# Patient Record
Sex: Female | Born: 1950 | Race: White | Hispanic: No | Marital: Single | Smoking: Former smoker
Health system: Southern US, Community
[De-identification: ages and names within clinical notes are randomized; demographics above are authoritative.]

## PROBLEM LIST (undated history)

## (undated) DIAGNOSIS — H269 Unspecified cataract: Secondary | ICD-10-CM

## (undated) DIAGNOSIS — E785 Hyperlipidemia, unspecified: Secondary | ICD-10-CM

## (undated) DIAGNOSIS — K219 Gastro-esophageal reflux disease without esophagitis: Secondary | ICD-10-CM

## (undated) DIAGNOSIS — R51 Headache: Secondary | ICD-10-CM

## (undated) DIAGNOSIS — F32A Depression, unspecified: Secondary | ICD-10-CM

## (undated) DIAGNOSIS — G35 Multiple sclerosis: Secondary | ICD-10-CM

## (undated) DIAGNOSIS — R32 Unspecified urinary incontinence: Secondary | ICD-10-CM

## (undated) DIAGNOSIS — M810 Age-related osteoporosis without current pathological fracture: Secondary | ICD-10-CM

## (undated) DIAGNOSIS — F0781 Postconcussional syndrome: Secondary | ICD-10-CM

## (undated) DIAGNOSIS — I10 Essential (primary) hypertension: Secondary | ICD-10-CM

## (undated) DIAGNOSIS — F329 Major depressive disorder, single episode, unspecified: Secondary | ICD-10-CM

## (undated) DIAGNOSIS — M5030 Other cervical disc degeneration, unspecified cervical region: Secondary | ICD-10-CM

## (undated) DIAGNOSIS — A159 Respiratory tuberculosis unspecified: Secondary | ICD-10-CM

## (undated) DIAGNOSIS — Z9289 Personal history of other medical treatment: Secondary | ICD-10-CM

## (undated) DIAGNOSIS — S069X9A Unspecified intracranial injury with loss of consciousness of unspecified duration, initial encounter: Secondary | ICD-10-CM

## (undated) DIAGNOSIS — E079 Disorder of thyroid, unspecified: Secondary | ICD-10-CM

## (undated) DIAGNOSIS — R519 Headache, unspecified: Secondary | ICD-10-CM

## (undated) DIAGNOSIS — K5900 Constipation, unspecified: Secondary | ICD-10-CM

## (undated) DIAGNOSIS — G4733 Obstructive sleep apnea (adult) (pediatric): Secondary | ICD-10-CM

## (undated) DIAGNOSIS — B019 Varicella without complication: Secondary | ICD-10-CM

## (undated) DIAGNOSIS — M5020 Other cervical disc displacement, unspecified cervical region: Secondary | ICD-10-CM

## (undated) DIAGNOSIS — G459 Transient cerebral ischemic attack, unspecified: Secondary | ICD-10-CM

## (undated) HISTORY — DX: Obstructive sleep apnea (adult) (pediatric): G47.33

## (undated) HISTORY — DX: Essential (primary) hypertension: I10

## (undated) HISTORY — DX: Postconcussional syndrome: F07.81

## (undated) HISTORY — DX: Respiratory tuberculosis unspecified: A15.9

## (undated) HISTORY — DX: Age-related osteoporosis without current pathological fracture: M81.0

## (undated) HISTORY — DX: Other cervical disc degeneration, unspecified cervical region: M50.30

## (undated) HISTORY — DX: Constipation, unspecified: K59.00

## (undated) HISTORY — DX: Transient cerebral ischemic attack, unspecified: G45.9

## (undated) HISTORY — DX: Major depressive disorder, single episode, unspecified: F32.9

## (undated) HISTORY — DX: Headache, unspecified: R51.9

## (undated) HISTORY — DX: Depression, unspecified: F32.A

## (undated) HISTORY — DX: Disorder of thyroid, unspecified: E07.9

## (undated) HISTORY — DX: Unspecified intracranial injury with loss of consciousness of unspecified duration, initial encounter: S06.9X9A

## (undated) HISTORY — DX: Unspecified urinary incontinence: R32

## (undated) HISTORY — DX: Multiple sclerosis: G35

## (undated) HISTORY — PX: LAPAROSCOPIC HYSTERECTOMY: SHX1926

## (undated) HISTORY — DX: Gastro-esophageal reflux disease without esophagitis: K21.9

## (undated) HISTORY — DX: Unspecified cataract: H26.9

## (undated) HISTORY — DX: Varicella without complication: B01.9

## (undated) HISTORY — PX: COLONOSCOPY: SHX174

## (undated) HISTORY — DX: Personal history of other medical treatment: Z92.89

## (undated) HISTORY — DX: Hyperlipidemia, unspecified: E78.5

## (undated) HISTORY — DX: Other cervical disc displacement, unspecified cervical region: M50.20

## (undated) HISTORY — DX: Headache: R51

---
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
## 1986-10-18 HISTORY — PX: BREAST BIOPSY: SHX20

## 2012-10-18 DIAGNOSIS — S069X9A Unspecified intracranial injury with loss of consciousness of unspecified duration, initial encounter: Secondary | ICD-10-CM

## 2012-10-18 DIAGNOSIS — S069XAA Unspecified intracranial injury with loss of consciousness status unknown, initial encounter: Secondary | ICD-10-CM

## 2012-10-18 HISTORY — DX: Unspecified intracranial injury with loss of consciousness status unknown, initial encounter: S06.9XAA

## 2012-10-18 HISTORY — DX: Unspecified intracranial injury with loss of consciousness of unspecified duration, initial encounter: S06.9X9A

## 2013-05-09 LAB — HM MAMMOGRAPHY

## 2013-10-18 DIAGNOSIS — S069X9A Unspecified intracranial injury with loss of consciousness of unspecified duration, initial encounter: Secondary | ICD-10-CM

## 2013-10-18 DIAGNOSIS — S069XAA Unspecified intracranial injury with loss of consciousness status unknown, initial encounter: Secondary | ICD-10-CM

## 2013-10-18 DIAGNOSIS — F0781 Postconcussional syndrome: Secondary | ICD-10-CM

## 2013-10-18 HISTORY — DX: Unspecified intracranial injury with loss of consciousness status unknown, initial encounter: S06.9XAA

## 2013-10-18 HISTORY — DX: Unspecified intracranial injury with loss of consciousness of unspecified duration, initial encounter: S06.9X9A

## 2013-10-18 HISTORY — DX: Postconcussional syndrome: F07.81

## 2014-02-21 LAB — TSH: TSH: 5.7 (ref <=5.90)

## 2014-03-03 LAB — VITAMIN B12: Vitamin B-12: 548

## 2014-10-18 HISTORY — PX: ESOPHAGOGASTRODUODENOSCOPY: SHX1529

## 2014-12-04 LAB — BASIC METABOLIC PANEL
BUN: 15 (ref 4–21)
CREATININE: 0.7 (ref 0.5–1.1)
GLUCOSE: 103
POTASSIUM: 4.5 (ref 3.4–5.3)
Sodium: 139 (ref 137–147)

## 2015-02-16 HISTORY — PX: BACK SURGERY: SHX140

## 2015-04-12 HISTORY — PX: OTHER SURGICAL HISTORY: SHX169

## 2015-10-20 DIAGNOSIS — M25511 Pain in right shoulder: Secondary | ICD-10-CM | POA: Diagnosis not present

## 2015-10-20 DIAGNOSIS — M7541 Impingement syndrome of right shoulder: Secondary | ICD-10-CM | POA: Diagnosis not present

## 2015-10-20 DIAGNOSIS — M25512 Pain in left shoulder: Secondary | ICD-10-CM | POA: Diagnosis not present

## 2015-10-20 DIAGNOSIS — M7542 Impingement syndrome of left shoulder: Secondary | ICD-10-CM | POA: Diagnosis not present

## 2015-10-23 DIAGNOSIS — M75102 Unspecified rotator cuff tear or rupture of left shoulder, not specified as traumatic: Secondary | ICD-10-CM | POA: Diagnosis not present

## 2015-10-23 DIAGNOSIS — M5489 Other dorsalgia: Secondary | ICD-10-CM | POA: Diagnosis not present

## 2015-10-23 DIAGNOSIS — M7521 Bicipital tendinitis, right shoulder: Secondary | ICD-10-CM | POA: Diagnosis not present

## 2015-10-23 DIAGNOSIS — M25512 Pain in left shoulder: Secondary | ICD-10-CM | POA: Diagnosis not present

## 2015-10-27 DIAGNOSIS — M25511 Pain in right shoulder: Secondary | ICD-10-CM | POA: Diagnosis not present

## 2015-10-27 DIAGNOSIS — M7541 Impingement syndrome of right shoulder: Secondary | ICD-10-CM | POA: Diagnosis not present

## 2015-10-27 DIAGNOSIS — M25512 Pain in left shoulder: Secondary | ICD-10-CM | POA: Diagnosis not present

## 2015-10-27 DIAGNOSIS — M7542 Impingement syndrome of left shoulder: Secondary | ICD-10-CM | POA: Diagnosis not present

## 2015-10-27 DIAGNOSIS — Z7952 Long term (current) use of systemic steroids: Secondary | ICD-10-CM | POA: Diagnosis not present

## 2015-10-27 DIAGNOSIS — M810 Age-related osteoporosis without current pathological fracture: Secondary | ICD-10-CM | POA: Diagnosis not present

## 2015-10-27 LAB — HM DEXA SCAN

## 2015-10-28 DIAGNOSIS — M65812 Other synovitis and tenosynovitis, left shoulder: Secondary | ICD-10-CM | POA: Diagnosis not present

## 2015-10-28 DIAGNOSIS — M25512 Pain in left shoulder: Secondary | ICD-10-CM | POA: Diagnosis not present

## 2015-10-28 DIAGNOSIS — M75102 Unspecified rotator cuff tear or rupture of left shoulder, not specified as traumatic: Secondary | ICD-10-CM | POA: Diagnosis not present

## 2015-10-28 DIAGNOSIS — S4382XA Sprain of other specified parts of left shoulder girdle, initial encounter: Secondary | ICD-10-CM | POA: Diagnosis not present

## 2015-10-28 DIAGNOSIS — Z7952 Long term (current) use of systemic steroids: Secondary | ICD-10-CM | POA: Diagnosis not present

## 2015-11-03 DIAGNOSIS — M25511 Pain in right shoulder: Secondary | ICD-10-CM | POA: Diagnosis not present

## 2015-11-03 DIAGNOSIS — M25512 Pain in left shoulder: Secondary | ICD-10-CM | POA: Diagnosis not present

## 2015-11-03 DIAGNOSIS — M7541 Impingement syndrome of right shoulder: Secondary | ICD-10-CM | POA: Diagnosis not present

## 2015-11-03 DIAGNOSIS — M7542 Impingement syndrome of left shoulder: Secondary | ICD-10-CM | POA: Diagnosis not present

## 2015-11-10 DIAGNOSIS — M25511 Pain in right shoulder: Secondary | ICD-10-CM | POA: Diagnosis not present

## 2015-11-10 DIAGNOSIS — M25512 Pain in left shoulder: Secondary | ICD-10-CM | POA: Diagnosis not present

## 2015-11-10 DIAGNOSIS — M7541 Impingement syndrome of right shoulder: Secondary | ICD-10-CM | POA: Diagnosis not present

## 2015-11-10 DIAGNOSIS — M7542 Impingement syndrome of left shoulder: Secondary | ICD-10-CM | POA: Diagnosis not present

## 2015-11-16 DIAGNOSIS — H9201 Otalgia, right ear: Secondary | ICD-10-CM | POA: Diagnosis not present

## 2015-11-16 DIAGNOSIS — R0981 Nasal congestion: Secondary | ICD-10-CM | POA: Diagnosis not present

## 2015-11-16 DIAGNOSIS — J019 Acute sinusitis, unspecified: Secondary | ICD-10-CM | POA: Diagnosis not present

## 2015-11-26 DIAGNOSIS — K219 Gastro-esophageal reflux disease without esophagitis: Secondary | ICD-10-CM | POA: Diagnosis not present

## 2015-11-26 DIAGNOSIS — I10 Essential (primary) hypertension: Secondary | ICD-10-CM | POA: Diagnosis not present

## 2015-11-26 DIAGNOSIS — Z79899 Other long term (current) drug therapy: Secondary | ICD-10-CM | POA: Diagnosis not present

## 2015-11-26 DIAGNOSIS — M810 Age-related osteoporosis without current pathological fracture: Secondary | ICD-10-CM | POA: Diagnosis not present

## 2015-11-26 DIAGNOSIS — M7521 Bicipital tendinitis, right shoulder: Secondary | ICD-10-CM | POA: Diagnosis not present

## 2015-12-10 DIAGNOSIS — M5126 Other intervertebral disc displacement, lumbar region: Secondary | ICD-10-CM | POA: Diagnosis not present

## 2015-12-11 DIAGNOSIS — R946 Abnormal results of thyroid function studies: Secondary | ICD-10-CM | POA: Diagnosis not present

## 2015-12-18 DIAGNOSIS — R1084 Generalized abdominal pain: Secondary | ICD-10-CM | POA: Diagnosis not present

## 2015-12-18 DIAGNOSIS — R143 Flatulence: Secondary | ICD-10-CM | POA: Diagnosis not present

## 2015-12-18 DIAGNOSIS — R112 Nausea with vomiting, unspecified: Secondary | ICD-10-CM | POA: Diagnosis not present

## 2015-12-18 DIAGNOSIS — E860 Dehydration: Secondary | ICD-10-CM | POA: Diagnosis not present

## 2015-12-18 DIAGNOSIS — I10 Essential (primary) hypertension: Secondary | ICD-10-CM | POA: Diagnosis not present

## 2015-12-18 DIAGNOSIS — R197 Diarrhea, unspecified: Secondary | ICD-10-CM | POA: Diagnosis not present

## 2015-12-18 DIAGNOSIS — Z87891 Personal history of nicotine dependence: Secondary | ICD-10-CM | POA: Diagnosis not present

## 2016-01-13 DIAGNOSIS — M810 Age-related osteoporosis without current pathological fracture: Secondary | ICD-10-CM | POA: Diagnosis not present

## 2016-01-13 DIAGNOSIS — Z7952 Long term (current) use of systemic steroids: Secondary | ICD-10-CM | POA: Diagnosis not present

## 2016-02-10 DIAGNOSIS — M7552 Bursitis of left shoulder: Secondary | ICD-10-CM | POA: Diagnosis not present

## 2016-02-10 DIAGNOSIS — M25712 Osteophyte, left shoulder: Secondary | ICD-10-CM | POA: Diagnosis not present

## 2016-02-10 DIAGNOSIS — M75112 Incomplete rotator cuff tear or rupture of left shoulder, not specified as traumatic: Secondary | ICD-10-CM | POA: Diagnosis not present

## 2016-02-12 DIAGNOSIS — M75102 Unspecified rotator cuff tear or rupture of left shoulder, not specified as traumatic: Secondary | ICD-10-CM | POA: Diagnosis not present

## 2016-02-13 DIAGNOSIS — M7582 Other shoulder lesions, left shoulder: Secondary | ICD-10-CM | POA: Diagnosis not present

## 2016-02-13 DIAGNOSIS — G8918 Other acute postprocedural pain: Secondary | ICD-10-CM | POA: Diagnosis not present

## 2016-02-13 DIAGNOSIS — M75112 Incomplete rotator cuff tear or rupture of left shoulder, not specified as traumatic: Secondary | ICD-10-CM | POA: Diagnosis not present

## 2016-02-13 DIAGNOSIS — I10 Essential (primary) hypertension: Secondary | ICD-10-CM | POA: Diagnosis not present

## 2016-02-13 DIAGNOSIS — M25512 Pain in left shoulder: Secondary | ICD-10-CM | POA: Diagnosis not present

## 2016-02-13 DIAGNOSIS — M7542 Impingement syndrome of left shoulder: Secondary | ICD-10-CM | POA: Diagnosis not present

## 2016-02-13 DIAGNOSIS — S43492A Other sprain of left shoulder joint, initial encounter: Secondary | ICD-10-CM | POA: Diagnosis not present

## 2016-02-13 DIAGNOSIS — Z79899 Other long term (current) drug therapy: Secondary | ICD-10-CM | POA: Diagnosis not present

## 2016-02-13 HISTORY — PX: SHOULDER ARTHROSCOPY W/ LABRAL REPAIR: SHX2399

## 2016-03-02 DIAGNOSIS — Z9889 Other specified postprocedural states: Secondary | ICD-10-CM | POA: Diagnosis not present

## 2016-03-02 DIAGNOSIS — K210 Gastro-esophageal reflux disease with esophagitis: Secondary | ICD-10-CM | POA: Diagnosis not present

## 2016-03-09 DIAGNOSIS — M25612 Stiffness of left shoulder, not elsewhere classified: Secondary | ICD-10-CM | POA: Diagnosis not present

## 2016-03-09 DIAGNOSIS — M25512 Pain in left shoulder: Secondary | ICD-10-CM | POA: Diagnosis not present

## 2016-03-10 DIAGNOSIS — M25512 Pain in left shoulder: Secondary | ICD-10-CM | POA: Diagnosis not present

## 2016-03-10 DIAGNOSIS — M25612 Stiffness of left shoulder, not elsewhere classified: Secondary | ICD-10-CM | POA: Diagnosis not present

## 2016-03-16 DIAGNOSIS — M25512 Pain in left shoulder: Secondary | ICD-10-CM | POA: Diagnosis not present

## 2016-03-16 DIAGNOSIS — M25612 Stiffness of left shoulder, not elsewhere classified: Secondary | ICD-10-CM | POA: Diagnosis not present

## 2016-03-18 DIAGNOSIS — M25512 Pain in left shoulder: Secondary | ICD-10-CM | POA: Diagnosis not present

## 2016-03-18 DIAGNOSIS — M25612 Stiffness of left shoulder, not elsewhere classified: Secondary | ICD-10-CM | POA: Diagnosis not present

## 2016-03-22 DIAGNOSIS — M25512 Pain in left shoulder: Secondary | ICD-10-CM | POA: Diagnosis not present

## 2016-03-22 DIAGNOSIS — M25612 Stiffness of left shoulder, not elsewhere classified: Secondary | ICD-10-CM | POA: Diagnosis not present

## 2016-03-30 DIAGNOSIS — M25512 Pain in left shoulder: Secondary | ICD-10-CM | POA: Diagnosis not present

## 2016-03-30 DIAGNOSIS — F0781 Postconcussional syndrome: Secondary | ICD-10-CM | POA: Diagnosis not present

## 2016-03-30 DIAGNOSIS — M25612 Stiffness of left shoulder, not elsewhere classified: Secondary | ICD-10-CM | POA: Diagnosis not present

## 2016-04-01 DIAGNOSIS — M25512 Pain in left shoulder: Secondary | ICD-10-CM | POA: Diagnosis not present

## 2016-04-01 DIAGNOSIS — M25612 Stiffness of left shoulder, not elsewhere classified: Secondary | ICD-10-CM | POA: Diagnosis not present

## 2016-04-01 DIAGNOSIS — F0781 Postconcussional syndrome: Secondary | ICD-10-CM | POA: Diagnosis not present

## 2016-04-08 DIAGNOSIS — M25512 Pain in left shoulder: Secondary | ICD-10-CM | POA: Diagnosis not present

## 2016-04-08 DIAGNOSIS — F0781 Postconcussional syndrome: Secondary | ICD-10-CM | POA: Diagnosis not present

## 2016-04-08 DIAGNOSIS — M25612 Stiffness of left shoulder, not elsewhere classified: Secondary | ICD-10-CM | POA: Diagnosis not present

## 2016-04-09 DIAGNOSIS — F4323 Adjustment disorder with mixed anxiety and depressed mood: Secondary | ICD-10-CM | POA: Diagnosis not present

## 2016-04-13 DIAGNOSIS — F0781 Postconcussional syndrome: Secondary | ICD-10-CM | POA: Diagnosis not present

## 2016-04-13 DIAGNOSIS — M25512 Pain in left shoulder: Secondary | ICD-10-CM | POA: Diagnosis not present

## 2016-04-13 DIAGNOSIS — M25612 Stiffness of left shoulder, not elsewhere classified: Secondary | ICD-10-CM | POA: Diagnosis not present

## 2016-04-16 DIAGNOSIS — F0781 Postconcussional syndrome: Secondary | ICD-10-CM | POA: Diagnosis not present

## 2016-04-16 DIAGNOSIS — M25512 Pain in left shoulder: Secondary | ICD-10-CM | POA: Diagnosis not present

## 2016-04-16 DIAGNOSIS — M25612 Stiffness of left shoulder, not elsewhere classified: Secondary | ICD-10-CM | POA: Diagnosis not present

## 2016-04-27 DIAGNOSIS — M25512 Pain in left shoulder: Secondary | ICD-10-CM | POA: Diagnosis not present

## 2016-04-27 DIAGNOSIS — F0781 Postconcussional syndrome: Secondary | ICD-10-CM | POA: Diagnosis not present

## 2016-04-27 DIAGNOSIS — R413 Other amnesia: Secondary | ICD-10-CM | POA: Diagnosis not present

## 2016-04-27 DIAGNOSIS — F418 Other specified anxiety disorders: Secondary | ICD-10-CM | POA: Diagnosis not present

## 2016-04-27 DIAGNOSIS — M25612 Stiffness of left shoulder, not elsewhere classified: Secondary | ICD-10-CM | POA: Diagnosis not present

## 2016-04-28 HISTORY — PX: OTHER SURGICAL HISTORY: SHX169

## 2016-05-05 DIAGNOSIS — M25612 Stiffness of left shoulder, not elsewhere classified: Secondary | ICD-10-CM | POA: Diagnosis not present

## 2016-05-05 DIAGNOSIS — F0781 Postconcussional syndrome: Secondary | ICD-10-CM | POA: Diagnosis not present

## 2016-05-05 DIAGNOSIS — M25512 Pain in left shoulder: Secondary | ICD-10-CM | POA: Diagnosis not present

## 2016-05-11 DIAGNOSIS — M25512 Pain in left shoulder: Secondary | ICD-10-CM | POA: Diagnosis not present

## 2016-05-11 DIAGNOSIS — F0781 Postconcussional syndrome: Secondary | ICD-10-CM | POA: Diagnosis not present

## 2016-05-11 DIAGNOSIS — M25612 Stiffness of left shoulder, not elsewhere classified: Secondary | ICD-10-CM | POA: Diagnosis not present

## 2016-05-18 DIAGNOSIS — M25612 Stiffness of left shoulder, not elsewhere classified: Secondary | ICD-10-CM | POA: Diagnosis not present

## 2016-05-18 DIAGNOSIS — M25512 Pain in left shoulder: Secondary | ICD-10-CM | POA: Diagnosis not present

## 2016-05-18 DIAGNOSIS — F0781 Postconcussional syndrome: Secondary | ICD-10-CM | POA: Diagnosis not present

## 2016-05-25 DIAGNOSIS — M25612 Stiffness of left shoulder, not elsewhere classified: Secondary | ICD-10-CM | POA: Diagnosis not present

## 2016-05-25 DIAGNOSIS — F0781 Postconcussional syndrome: Secondary | ICD-10-CM | POA: Diagnosis not present

## 2016-05-25 DIAGNOSIS — M25512 Pain in left shoulder: Secondary | ICD-10-CM | POA: Diagnosis not present

## 2016-06-08 DIAGNOSIS — M25512 Pain in left shoulder: Secondary | ICD-10-CM | POA: Diagnosis not present

## 2016-06-08 DIAGNOSIS — F0781 Postconcussional syndrome: Secondary | ICD-10-CM | POA: Diagnosis not present

## 2016-06-08 DIAGNOSIS — M25612 Stiffness of left shoulder, not elsewhere classified: Secondary | ICD-10-CM | POA: Diagnosis not present

## 2016-06-09 DIAGNOSIS — F4323 Adjustment disorder with mixed anxiety and depressed mood: Secondary | ICD-10-CM | POA: Diagnosis not present

## 2016-06-18 DIAGNOSIS — M25512 Pain in left shoulder: Secondary | ICD-10-CM | POA: Diagnosis not present

## 2016-06-18 DIAGNOSIS — F0781 Postconcussional syndrome: Secondary | ICD-10-CM | POA: Diagnosis not present

## 2016-06-18 DIAGNOSIS — M25612 Stiffness of left shoulder, not elsewhere classified: Secondary | ICD-10-CM | POA: Diagnosis not present

## 2016-06-23 DIAGNOSIS — R41841 Cognitive communication deficit: Secondary | ICD-10-CM | POA: Diagnosis not present

## 2016-06-23 DIAGNOSIS — F0781 Postconcussional syndrome: Secondary | ICD-10-CM | POA: Diagnosis not present

## 2016-06-23 HISTORY — PX: OTHER SURGICAL HISTORY: SHX169

## 2016-06-29 DIAGNOSIS — M25612 Stiffness of left shoulder, not elsewhere classified: Secondary | ICD-10-CM | POA: Diagnosis not present

## 2016-06-29 DIAGNOSIS — F0781 Postconcussional syndrome: Secondary | ICD-10-CM | POA: Diagnosis not present

## 2016-06-29 DIAGNOSIS — M25512 Pain in left shoulder: Secondary | ICD-10-CM | POA: Diagnosis not present

## 2016-06-30 DIAGNOSIS — F4323 Adjustment disorder with mixed anxiety and depressed mood: Secondary | ICD-10-CM | POA: Diagnosis not present

## 2016-07-02 DIAGNOSIS — N329 Bladder disorder, unspecified: Secondary | ICD-10-CM | POA: Diagnosis not present

## 2016-07-02 DIAGNOSIS — M25512 Pain in left shoulder: Secondary | ICD-10-CM | POA: Diagnosis not present

## 2016-07-02 DIAGNOSIS — G4733 Obstructive sleep apnea (adult) (pediatric): Secondary | ICD-10-CM | POA: Diagnosis not present

## 2016-07-02 DIAGNOSIS — F0781 Postconcussional syndrome: Secondary | ICD-10-CM | POA: Diagnosis not present

## 2016-07-02 DIAGNOSIS — R930 Abnormal findings on diagnostic imaging of skull and head, not elsewhere classified: Secondary | ICD-10-CM | POA: Diagnosis not present

## 2016-07-02 DIAGNOSIS — M25612 Stiffness of left shoulder, not elsewhere classified: Secondary | ICD-10-CM | POA: Diagnosis not present

## 2016-07-05 DIAGNOSIS — M25612 Stiffness of left shoulder, not elsewhere classified: Secondary | ICD-10-CM | POA: Diagnosis not present

## 2016-07-05 DIAGNOSIS — R41841 Cognitive communication deficit: Secondary | ICD-10-CM | POA: Diagnosis not present

## 2016-07-05 DIAGNOSIS — M25512 Pain in left shoulder: Secondary | ICD-10-CM | POA: Diagnosis not present

## 2016-07-05 DIAGNOSIS — F0781 Postconcussional syndrome: Secondary | ICD-10-CM | POA: Diagnosis not present

## 2016-07-06 DIAGNOSIS — M7552 Bursitis of left shoulder: Secondary | ICD-10-CM | POA: Diagnosis not present

## 2016-07-06 DIAGNOSIS — M7542 Impingement syndrome of left shoulder: Secondary | ICD-10-CM | POA: Diagnosis not present

## 2016-07-07 DIAGNOSIS — F0781 Postconcussional syndrome: Secondary | ICD-10-CM | POA: Diagnosis not present

## 2016-07-07 DIAGNOSIS — M25512 Pain in left shoulder: Secondary | ICD-10-CM | POA: Diagnosis not present

## 2016-07-07 DIAGNOSIS — M25612 Stiffness of left shoulder, not elsewhere classified: Secondary | ICD-10-CM | POA: Diagnosis not present

## 2016-07-12 DIAGNOSIS — F0781 Postconcussional syndrome: Secondary | ICD-10-CM | POA: Diagnosis not present

## 2016-07-12 DIAGNOSIS — M25512 Pain in left shoulder: Secondary | ICD-10-CM | POA: Diagnosis not present

## 2016-07-12 DIAGNOSIS — M25612 Stiffness of left shoulder, not elsewhere classified: Secondary | ICD-10-CM | POA: Diagnosis not present

## 2016-07-14 DIAGNOSIS — M25512 Pain in left shoulder: Secondary | ICD-10-CM | POA: Diagnosis not present

## 2016-07-14 DIAGNOSIS — M25612 Stiffness of left shoulder, not elsewhere classified: Secondary | ICD-10-CM | POA: Diagnosis not present

## 2016-07-14 DIAGNOSIS — F0781 Postconcussional syndrome: Secondary | ICD-10-CM | POA: Diagnosis not present

## 2016-07-15 DIAGNOSIS — R41841 Cognitive communication deficit: Secondary | ICD-10-CM | POA: Diagnosis not present

## 2016-07-15 DIAGNOSIS — F4323 Adjustment disorder with mixed anxiety and depressed mood: Secondary | ICD-10-CM | POA: Diagnosis not present

## 2016-07-15 DIAGNOSIS — F0781 Postconcussional syndrome: Secondary | ICD-10-CM | POA: Diagnosis not present

## 2016-07-19 DIAGNOSIS — M25612 Stiffness of left shoulder, not elsewhere classified: Secondary | ICD-10-CM | POA: Diagnosis not present

## 2016-07-19 DIAGNOSIS — R41841 Cognitive communication deficit: Secondary | ICD-10-CM | POA: Diagnosis not present

## 2016-07-19 DIAGNOSIS — M25512 Pain in left shoulder: Secondary | ICD-10-CM | POA: Diagnosis not present

## 2016-07-19 DIAGNOSIS — F0781 Postconcussional syndrome: Secondary | ICD-10-CM | POA: Diagnosis not present

## 2016-11-02 DIAGNOSIS — M25512 Pain in left shoulder: Secondary | ICD-10-CM | POA: Diagnosis not present

## 2016-11-02 DIAGNOSIS — M7542 Impingement syndrome of left shoulder: Secondary | ICD-10-CM | POA: Diagnosis not present

## 2016-11-02 DIAGNOSIS — M25712 Osteophyte, left shoulder: Secondary | ICD-10-CM | POA: Diagnosis not present

## 2016-11-02 DIAGNOSIS — M7552 Bursitis of left shoulder: Secondary | ICD-10-CM | POA: Diagnosis not present

## 2016-11-04 DIAGNOSIS — S060X9A Concussion with loss of consciousness of unspecified duration, initial encounter: Secondary | ICD-10-CM | POA: Diagnosis not present

## 2016-11-04 DIAGNOSIS — K210 Gastro-esophageal reflux disease with esophagitis: Secondary | ICD-10-CM | POA: Diagnosis not present

## 2016-11-04 DIAGNOSIS — K2270 Barrett's esophagus without dysplasia: Secondary | ICD-10-CM | POA: Diagnosis not present

## 2016-11-04 DIAGNOSIS — Z9889 Other specified postprocedural states: Secondary | ICD-10-CM | POA: Diagnosis not present

## 2016-11-09 DIAGNOSIS — G4710 Hypersomnia, unspecified: Secondary | ICD-10-CM | POA: Diagnosis not present

## 2016-11-11 DIAGNOSIS — N329 Bladder disorder, unspecified: Secondary | ICD-10-CM | POA: Diagnosis not present

## 2016-11-11 DIAGNOSIS — F0781 Postconcussional syndrome: Secondary | ICD-10-CM | POA: Diagnosis not present

## 2016-11-11 DIAGNOSIS — G4733 Obstructive sleep apnea (adult) (pediatric): Secondary | ICD-10-CM | POA: Diagnosis not present

## 2016-11-11 DIAGNOSIS — R930 Abnormal findings on diagnostic imaging of skull and head, not elsewhere classified: Secondary | ICD-10-CM | POA: Diagnosis not present

## 2016-11-16 DIAGNOSIS — R9082 White matter disease, unspecified: Secondary | ICD-10-CM | POA: Diagnosis not present

## 2016-11-16 DIAGNOSIS — F0781 Postconcussional syndrome: Secondary | ICD-10-CM | POA: Diagnosis not present

## 2016-11-16 DIAGNOSIS — M5013 Cervical disc disorder with radiculopathy, cervicothoracic region: Secondary | ICD-10-CM | POA: Diagnosis not present

## 2016-11-16 DIAGNOSIS — M5021 Other cervical disc displacement,  high cervical region: Secondary | ICD-10-CM | POA: Diagnosis not present

## 2016-11-16 DIAGNOSIS — M47812 Spondylosis without myelopathy or radiculopathy, cervical region: Secondary | ICD-10-CM | POA: Diagnosis not present

## 2016-11-16 DIAGNOSIS — Z048 Encounter for examination and observation for other specified reasons: Secondary | ICD-10-CM | POA: Diagnosis not present

## 2016-11-17 DIAGNOSIS — Z1231 Encounter for screening mammogram for malignant neoplasm of breast: Secondary | ICD-10-CM | POA: Diagnosis not present

## 2016-11-19 DIAGNOSIS — K648 Other hemorrhoids: Secondary | ICD-10-CM | POA: Diagnosis not present

## 2016-11-19 DIAGNOSIS — K660 Peritoneal adhesions (postprocedural) (postinfection): Secondary | ICD-10-CM | POA: Diagnosis not present

## 2016-11-19 DIAGNOSIS — Z1211 Encounter for screening for malignant neoplasm of colon: Secondary | ICD-10-CM | POA: Diagnosis not present

## 2016-11-19 DIAGNOSIS — I10 Essential (primary) hypertension: Secondary | ICD-10-CM | POA: Diagnosis not present

## 2016-11-19 DIAGNOSIS — K5730 Diverticulosis of large intestine without perforation or abscess without bleeding: Secondary | ICD-10-CM | POA: Diagnosis not present

## 2016-11-24 DIAGNOSIS — M25512 Pain in left shoulder: Secondary | ICD-10-CM | POA: Diagnosis not present

## 2016-11-24 DIAGNOSIS — M25612 Stiffness of left shoulder, not elsewhere classified: Secondary | ICD-10-CM | POA: Diagnosis not present

## 2016-11-25 DIAGNOSIS — F0781 Postconcussional syndrome: Secondary | ICD-10-CM | POA: Diagnosis not present

## 2016-11-25 DIAGNOSIS — R41841 Cognitive communication deficit: Secondary | ICD-10-CM | POA: Diagnosis not present

## 2016-11-26 DIAGNOSIS — M25512 Pain in left shoulder: Secondary | ICD-10-CM | POA: Diagnosis not present

## 2016-11-26 DIAGNOSIS — M25612 Stiffness of left shoulder, not elsewhere classified: Secondary | ICD-10-CM | POA: Diagnosis not present

## 2016-11-29 DIAGNOSIS — R55 Syncope and collapse: Secondary | ICD-10-CM | POA: Diagnosis not present

## 2016-11-29 DIAGNOSIS — I10 Essential (primary) hypertension: Secondary | ICD-10-CM | POA: Diagnosis not present

## 2016-11-29 DIAGNOSIS — Z87828 Personal history of other (healed) physical injury and trauma: Secondary | ICD-10-CM | POA: Diagnosis not present

## 2016-11-29 DIAGNOSIS — R413 Other amnesia: Secondary | ICD-10-CM | POA: Diagnosis not present

## 2016-11-29 DIAGNOSIS — Z87891 Personal history of nicotine dependence: Secondary | ICD-10-CM | POA: Diagnosis not present

## 2016-11-29 DIAGNOSIS — R471 Dysarthria and anarthria: Secondary | ICD-10-CM | POA: Diagnosis not present

## 2016-11-29 DIAGNOSIS — G8929 Other chronic pain: Secondary | ICD-10-CM | POA: Diagnosis not present

## 2016-11-29 DIAGNOSIS — R531 Weakness: Secondary | ICD-10-CM | POA: Diagnosis not present

## 2016-11-29 DIAGNOSIS — F329 Major depressive disorder, single episode, unspecified: Secondary | ICD-10-CM | POA: Diagnosis not present

## 2016-11-29 DIAGNOSIS — R410 Disorientation, unspecified: Secondary | ICD-10-CM | POA: Diagnosis not present

## 2016-11-29 DIAGNOSIS — R51 Headache: Secondary | ICD-10-CM | POA: Diagnosis not present

## 2016-11-29 DIAGNOSIS — G459 Transient cerebral ischemic attack, unspecified: Secondary | ICD-10-CM | POA: Diagnosis not present

## 2016-11-29 DIAGNOSIS — I169 Hypertensive crisis, unspecified: Secondary | ICD-10-CM | POA: Diagnosis not present

## 2016-11-29 DIAGNOSIS — K219 Gastro-esophageal reflux disease without esophagitis: Secondary | ICD-10-CM | POA: Diagnosis not present

## 2016-11-29 DIAGNOSIS — G35 Multiple sclerosis: Secondary | ICD-10-CM | POA: Diagnosis not present

## 2016-11-29 DIAGNOSIS — F418 Other specified anxiety disorders: Secondary | ICD-10-CM | POA: Diagnosis not present

## 2016-11-29 DIAGNOSIS — E876 Hypokalemia: Secondary | ICD-10-CM | POA: Diagnosis not present

## 2016-11-29 DIAGNOSIS — E039 Hypothyroidism, unspecified: Secondary | ICD-10-CM | POA: Diagnosis not present

## 2016-11-29 DIAGNOSIS — M549 Dorsalgia, unspecified: Secondary | ICD-10-CM | POA: Diagnosis not present

## 2016-11-30 DIAGNOSIS — R001 Bradycardia, unspecified: Secondary | ICD-10-CM | POA: Diagnosis not present

## 2016-11-30 DIAGNOSIS — M25512 Pain in left shoulder: Secondary | ICD-10-CM | POA: Diagnosis not present

## 2016-11-30 DIAGNOSIS — F0781 Postconcussional syndrome: Secondary | ICD-10-CM | POA: Diagnosis not present

## 2016-11-30 DIAGNOSIS — R55 Syncope and collapse: Secondary | ICD-10-CM | POA: Diagnosis not present

## 2016-11-30 DIAGNOSIS — I10 Essential (primary) hypertension: Secondary | ICD-10-CM | POA: Diagnosis not present

## 2016-11-30 DIAGNOSIS — G459 Transient cerebral ischemic attack, unspecified: Secondary | ICD-10-CM

## 2016-11-30 DIAGNOSIS — M25612 Stiffness of left shoulder, not elsewhere classified: Secondary | ICD-10-CM | POA: Diagnosis not present

## 2016-11-30 DIAGNOSIS — R51 Headache: Secondary | ICD-10-CM | POA: Diagnosis not present

## 2016-11-30 DIAGNOSIS — R5383 Other fatigue: Secondary | ICD-10-CM | POA: Diagnosis not present

## 2016-11-30 DIAGNOSIS — E039 Hypothyroidism, unspecified: Secondary | ICD-10-CM | POA: Diagnosis not present

## 2016-11-30 DIAGNOSIS — R531 Weakness: Secondary | ICD-10-CM | POA: Diagnosis not present

## 2016-11-30 DIAGNOSIS — R410 Disorientation, unspecified: Secondary | ICD-10-CM | POA: Diagnosis not present

## 2016-11-30 DIAGNOSIS — F329 Major depressive disorder, single episode, unspecified: Secondary | ICD-10-CM | POA: Diagnosis not present

## 2016-11-30 DIAGNOSIS — R471 Dysarthria and anarthria: Secondary | ICD-10-CM | POA: Diagnosis not present

## 2016-11-30 DIAGNOSIS — K219 Gastro-esophageal reflux disease without esophagitis: Secondary | ICD-10-CM | POA: Diagnosis not present

## 2016-11-30 DIAGNOSIS — I169 Hypertensive crisis, unspecified: Secondary | ICD-10-CM | POA: Diagnosis not present

## 2016-11-30 DIAGNOSIS — Z87828 Personal history of other (healed) physical injury and trauma: Secondary | ICD-10-CM | POA: Diagnosis not present

## 2016-11-30 DIAGNOSIS — I6529 Occlusion and stenosis of unspecified carotid artery: Secondary | ICD-10-CM | POA: Diagnosis not present

## 2016-11-30 DIAGNOSIS — Z87891 Personal history of nicotine dependence: Secondary | ICD-10-CM | POA: Diagnosis not present

## 2016-11-30 DIAGNOSIS — R42 Dizziness and giddiness: Secondary | ICD-10-CM | POA: Diagnosis not present

## 2016-11-30 DIAGNOSIS — Z87820 Personal history of traumatic brain injury: Secondary | ICD-10-CM | POA: Diagnosis not present

## 2016-11-30 DIAGNOSIS — R41841 Cognitive communication deficit: Secondary | ICD-10-CM | POA: Diagnosis not present

## 2016-11-30 DIAGNOSIS — E876 Hypokalemia: Secondary | ICD-10-CM | POA: Diagnosis not present

## 2016-11-30 HISTORY — DX: Transient cerebral ischemic attack, unspecified: G45.9

## 2016-11-30 LAB — PROTIME-INR

## 2016-12-01 DIAGNOSIS — R4182 Altered mental status, unspecified: Secondary | ICD-10-CM | POA: Diagnosis not present

## 2016-12-01 DIAGNOSIS — I169 Hypertensive crisis, unspecified: Secondary | ICD-10-CM | POA: Diagnosis not present

## 2016-12-01 DIAGNOSIS — R55 Syncope and collapse: Secondary | ICD-10-CM | POA: Diagnosis not present

## 2016-12-01 DIAGNOSIS — R410 Disorientation, unspecified: Secondary | ICD-10-CM | POA: Diagnosis not present

## 2016-12-02 DIAGNOSIS — I169 Hypertensive crisis, unspecified: Secondary | ICD-10-CM | POA: Diagnosis not present

## 2016-12-02 DIAGNOSIS — R4182 Altered mental status, unspecified: Secondary | ICD-10-CM | POA: Diagnosis not present

## 2016-12-02 DIAGNOSIS — R55 Syncope and collapse: Secondary | ICD-10-CM | POA: Diagnosis not present

## 2016-12-03 DIAGNOSIS — I169 Hypertensive crisis, unspecified: Secondary | ICD-10-CM | POA: Diagnosis not present

## 2016-12-03 DIAGNOSIS — E039 Hypothyroidism, unspecified: Secondary | ICD-10-CM | POA: Diagnosis not present

## 2016-12-03 DIAGNOSIS — I10 Essential (primary) hypertension: Secondary | ICD-10-CM | POA: Diagnosis not present

## 2016-12-03 DIAGNOSIS — R55 Syncope and collapse: Secondary | ICD-10-CM | POA: Diagnosis not present

## 2016-12-04 DIAGNOSIS — I10 Essential (primary) hypertension: Secondary | ICD-10-CM | POA: Diagnosis not present

## 2016-12-04 DIAGNOSIS — E039 Hypothyroidism, unspecified: Secondary | ICD-10-CM | POA: Diagnosis not present

## 2016-12-04 DIAGNOSIS — R55 Syncope and collapse: Secondary | ICD-10-CM | POA: Diagnosis not present

## 2016-12-04 DIAGNOSIS — I169 Hypertensive crisis, unspecified: Secondary | ICD-10-CM | POA: Diagnosis not present

## 2016-12-07 DIAGNOSIS — R41841 Cognitive communication deficit: Secondary | ICD-10-CM | POA: Diagnosis not present

## 2016-12-07 DIAGNOSIS — F0781 Postconcussional syndrome: Secondary | ICD-10-CM | POA: Diagnosis not present

## 2016-12-13 DIAGNOSIS — R930 Abnormal findings on diagnostic imaging of skull and head, not elsewhere classified: Secondary | ICD-10-CM | POA: Diagnosis not present

## 2016-12-14 DIAGNOSIS — R55 Syncope and collapse: Secondary | ICD-10-CM | POA: Diagnosis not present

## 2016-12-14 DIAGNOSIS — F0781 Postconcussional syndrome: Secondary | ICD-10-CM | POA: Diagnosis not present

## 2016-12-14 DIAGNOSIS — R41841 Cognitive communication deficit: Secondary | ICD-10-CM | POA: Diagnosis not present

## 2016-12-14 DIAGNOSIS — I10 Essential (primary) hypertension: Secondary | ICD-10-CM | POA: Diagnosis not present

## 2016-12-14 DIAGNOSIS — R410 Disorientation, unspecified: Secondary | ICD-10-CM | POA: Diagnosis not present

## 2016-12-15 DIAGNOSIS — G35 Multiple sclerosis: Secondary | ICD-10-CM

## 2016-12-15 HISTORY — DX: Multiple sclerosis: G35

## 2017-05-25 DIAGNOSIS — Z91030 Bee allergy status: Secondary | ICD-10-CM | POA: Diagnosis not present

## 2017-05-25 DIAGNOSIS — T63441A Toxic effect of venom of bees, accidental (unintentional), initial encounter: Secondary | ICD-10-CM | POA: Diagnosis not present

## 2017-05-25 DIAGNOSIS — F419 Anxiety disorder, unspecified: Secondary | ICD-10-CM | POA: Diagnosis not present

## 2017-05-25 DIAGNOSIS — I10 Essential (primary) hypertension: Secondary | ICD-10-CM | POA: Diagnosis not present

## 2017-06-07 DIAGNOSIS — I10 Essential (primary) hypertension: Secondary | ICD-10-CM | POA: Diagnosis not present

## 2017-06-07 LAB — TSH: TSH: 2.66 (ref 0.41–5.90)

## 2017-06-07 LAB — BASIC METABOLIC PANEL
BUN: 16 (ref 4–21)
CREATININE: 0.8 (ref 0.5–1.1)
Glucose: 107
Potassium: 4.3 (ref 3.4–5.3)
Sodium: 140 (ref 137–147)

## 2017-06-07 LAB — CBC AND DIFFERENTIAL
HEMATOCRIT: 41 (ref 36–46)
Hemoglobin: 14.2 (ref 12.0–16.0)
NEUTROS ABS: 5300
PLATELETS: 248 (ref 150–399)
WBC: 7.7

## 2017-06-07 LAB — LIPID PANEL
CHOLESTEROL: 180 (ref 0–200)
HDL: 85 — AB (ref 35–70)
LDL Cholesterol: 75
TRIGLYCERIDES: 102 (ref 40–160)

## 2017-06-07 LAB — HEPATIC FUNCTION PANEL
ALK PHOS: 105 (ref 25–125)
ALT: 18 (ref 7–35)
AST: 16 (ref 13–35)
BILIRUBIN, TOTAL: 0.2
Bilirubin, Direct: 0 — AB (ref 0.01–0.4)

## 2017-07-27 DIAGNOSIS — B353 Tinea pedis: Secondary | ICD-10-CM | POA: Diagnosis not present

## 2017-07-27 DIAGNOSIS — K145 Plicated tongue: Secondary | ICD-10-CM | POA: Diagnosis not present

## 2017-10-25 DIAGNOSIS — R0981 Nasal congestion: Secondary | ICD-10-CM | POA: Diagnosis not present

## 2017-10-25 DIAGNOSIS — H6691 Otitis media, unspecified, right ear: Secondary | ICD-10-CM | POA: Diagnosis not present

## 2017-10-25 DIAGNOSIS — I10 Essential (primary) hypertension: Secondary | ICD-10-CM | POA: Diagnosis not present

## 2018-01-16 ENCOUNTER — Encounter: Payer: Self-pay | Admitting: Family Medicine

## 2018-01-16 ENCOUNTER — Ambulatory Visit (INDEPENDENT_AMBULATORY_CARE_PROVIDER_SITE_OTHER): Payer: Medicare Other | Admitting: Family Medicine

## 2018-01-16 VITALS — BP 123/79 | HR 56 | Temp 97.6°F | Ht 61.0 in | Wt 166.8 lb

## 2018-01-16 DIAGNOSIS — S069X9S Unspecified intracranial injury with loss of consciousness of unspecified duration, sequela: Secondary | ICD-10-CM | POA: Diagnosis not present

## 2018-01-16 DIAGNOSIS — E785 Hyperlipidemia, unspecified: Secondary | ICD-10-CM | POA: Insufficient documentation

## 2018-01-16 DIAGNOSIS — I10 Essential (primary) hypertension: Secondary | ICD-10-CM | POA: Diagnosis not present

## 2018-01-16 DIAGNOSIS — Z87820 Personal history of traumatic brain injury: Secondary | ICD-10-CM | POA: Insufficient documentation

## 2018-01-16 DIAGNOSIS — F418 Other specified anxiety disorders: Secondary | ICD-10-CM | POA: Insufficient documentation

## 2018-01-16 DIAGNOSIS — E039 Hypothyroidism, unspecified: Secondary | ICD-10-CM | POA: Diagnosis not present

## 2018-01-16 DIAGNOSIS — S069X9A Unspecified intracranial injury with loss of consciousness of unspecified duration, initial encounter: Secondary | ICD-10-CM | POA: Insufficient documentation

## 2018-01-16 DIAGNOSIS — E782 Mixed hyperlipidemia: Secondary | ICD-10-CM

## 2018-01-16 DIAGNOSIS — Z0000 Encounter for general adult medical examination without abnormal findings: Secondary | ICD-10-CM

## 2018-01-16 LAB — COMPREHENSIVE METABOLIC PANEL
ALT: 17 U/L (ref 0–35)
AST: 15 U/L (ref 0–37)
Albumin: 4.5 g/dL (ref 3.5–5.2)
Alkaline Phosphatase: 70 U/L (ref 39–117)
BUN: 13 mg/dL (ref 6–23)
CALCIUM: 10.6 mg/dL — AB (ref 8.4–10.5)
CHLORIDE: 104 meq/L (ref 96–112)
CO2: 29 meq/L (ref 19–32)
Creatinine, Ser: 0.83 mg/dL (ref 0.40–1.20)
GFR: 72.82 mL/min (ref >60.00)
Glucose, Bld: 125 mg/dL — ABNORMAL HIGH (ref 70–99)
Potassium: 5 mEq/L (ref 3.5–5.1)
Sodium: 139 mEq/L (ref 135–145)
Total Bilirubin: 0.5 mg/dL (ref 0.2–1.2)
Total Protein: 7.1 g/dL (ref 6.0–8.3)

## 2018-01-16 LAB — TSH: TSH: 3.04 u[IU]/mL (ref 0.35–4.50)

## 2018-01-16 MED ORDER — FLUOXETINE HCL 40 MG PO CAPS: 40.0000 mg | ORAL_CAPSULE | Freq: Every day | ORAL | 0 refills | Status: DC

## 2018-01-16 NOTE — Patient Instructions (Addendum)
It was a pleasure to meet you today.   Once I get your records, I will refer you to neurology for follow ups.   I have referred you to psychology for therapy. They will call you to schedule.  We will call you with lab results once we get them.   I have increased your prozac today. The new dose at the pharmacy is 40 mg a day (take 1 pill a day).   Follow in 4 weeks.    Please help [place] help you:  We are honored you have chosen [facility] for your Primary Care home. Below you will find basic instructions that you may need to access in the future. Please help [place] help you by reading the instructions, which cover many of the frequent questions we experience.   Prescription refills and request:  -In order to allow more efficient response time, please call your pharmacy for all refills. They will forward the request electronically to [place]. This allows for the quickest possible response. Request left on a nurse line can take longer to refill, since these are checked as time allows between office patients and other phone calls.  - refill request can take up to 3-5 working days to complete.  - If request is sent electronically and request is appropiate, it is usually completed in 1-2 business days.  - all patients will need to be seen routinely for all chronic medical conditions requiring prescription medications (see follow-up below). If you are overdue for follow up on your condition, you will be asked to make an appointment and we will call in enough medication to cover you until your appointment (up to 30 days).  - all controlled substances will require a face to face visit to request/refill.  - if you desire your prescriptions to go through a new pharmacy, and have an active script at original pharmacy, you will need to call your pharmacy and have scripts transferred to new pharmacy. This is completed between the pharmacy locations and not by your provider.    Results: If any images or labs  were ordered, it can take up to 1 week to get results depending on the test ordered and the lab/facility running and resulting the test. - Normal or stable results, which do not need further discussion, may be released to your mychart immediately with attached note to you. A call may not be generated for normal results. Please make certain to sign up for mychart. If you have questions on how to activate your mychart you can call the front office.  - If your results need further discussion, our office will attempt to contact you via phone, and if unable to reach you after 2 attempts, we will release your abnormal result to your mychart with instructions.  - All results will be automatically released in mychart after 1 week.  - Your provider will provide you with explanation and instruction on all relevant material in your results. Please keep in mind, results and labs may appear confusing or abnormal to the untrained eye, but it does not mean they are actually abnormal for you personally. If you have any questions about your results that are not covered, or you desire more detailed explanation than what was provided, you should make an appointment with your provider to do so.   Our office handles many outgoing and incoming calls daily. If we have not contacted you within 1 week about your results, please check your mychart to see if there  is a message first and if not, then contact our office.  In helping with this matter, you help decrease call volume, and therefore allow [place] to be able to respond to patients needs more efficiently.   Acute office visits (sick visit):  An acute visit is intended for a new problem and are scheduled in shorter time slots to allow schedule openings for patients with new problems. This is the appropriate visit to discuss a new problem. In order to provide you with excellent quality medical care with proper time for you to explain your problem, have an exam and receive treatment  with instructions, these appointments should be limited to one new problem per visit. If you experience a new problem, in which you desire to be addressed, please make an acute office visit, we save openings on the schedule to accommodate you. Please do not save your new problem for any other type of visit, let us take care of it properly and quickly for you.   Follow up visits:  Depending on your condition(s) your provider will need to see you routinely in order to provide you with quality care and prescribe medication(s). Most chronic conditions (Example: hypertension, Diabetes, depression/anxiety... etc), require visits a couple times a year. Your provider will instruct you on proper follow up for your personal medical conditions and history. Please make certain to make follow up appointments for your condition as instructed. Failing to do so could result in lapse in your medication treatment/refills. If you request a refill, and are overdue to be seen on a condition, we will always provide you with a 30 day script (once) to allow you time to schedule.    Medicare wellness (well visit): - we have a wonderful Nurse Maudie Mercury), that will meet with you and provide you will yearly medicare wellness visits. These visits should occur yearly (can not be scheduled less than 1 calendar year apart) and cover preventive health, immunizations, advance directives and screenings you are entitled to yearly through your medicare benefits. Do not miss out on your entitled benefits, this is when medicare will pay for these benefits to be ordered for you.  These are strongly encouraged by your provider and is the appropriate type of visit to make certain you are up to date with all preventive health benefits. If you have not had your medicare wellness exam in the last 12 months, please make certain to schedule one by calling the office and schedule your medicare wellness with Maudie Mercury as soon as possible.   Yearly physical (well  visit):  - Adults are recommended to be seen yearly for physicals. Check with your insurance and date of your last physical, most insurances require one calendar year between physicals. Physicals include all preventive health topics, screenings, medical exam and labs that are appropriate for gender/age and history. You may have fasting labs needed at this visit. This is a well visit (not a sick visit), new problems should not be covered during this visit (see acute visit).  - Pediatric patients are seen more frequently when they are younger. Your provider will advise you on well child visit timing that is appropriate for your their age. - This is not a medicare wellness visit. Medicare wellness exams do not have an exam portion to the visit. Some medicare companies allow for a physical, some do not allow a yearly physical. If your medicare allows a yearly physical you can schedule the medicare wellness with our nurse Maudie Mercury and have your physical with  your provider after, on the same day. Please check with insurance for your full benefits.   Late Policy/No Shows:  - all new patients should arrive 15-30 minutes earlier than appointment to allow [place] time  to  obtain all personal demographics,  insurance information and for you to complete office paperwork. - All established patients should arrive 10-15 minutes earlier than appointment time to update all information and be checked in .  - In our best efforts to run on time, if you are late for your appointment you will be asked to either reschedule or if able, we will work you back into the schedule. There will be a wait time to work you back in the schedule,  depending on availability.  - If you are unable to make it to your appointment as scheduled, please call 24 hours ahead of time to allow [place] to fill the time slot with someone else who needs to be seen. If you do not cancel your appointment ahead of time, you may be charged a no show fee.

## 2018-01-16 NOTE — Progress Notes (Signed)
Patient ID: Abigail Michael, female  DOB: 23-Jan-1951, 67 y.o.   MRN: [id number] Patient Care Team    Relationship Specialty Notifications Start End  Ma Hillock, DO PCP - General Family Medicine  01/16/18     Chief Complaint  Patient presents with  . Establish Care    Depression    Subjective:  Abigail Michael is a 67 y.o.  female present for new patient establishment. All past medical history, surgical history, allergies, family history, immunizations, medications and social history were obtained/updated in the electronic medical record today. All recent labs, ED visits and hospitalizations within the last year were reviewed.  Depression with anxiety Has been on prozac for a little over a year after CVA. She reports she feels her depression and anxiety are worsening. She has had many life changes. Moved from [place] last year to be close to family. Currently living with her daughter and enjoying being close to her family. Having trouble adjusting after retiring, moving and CVA last year. She dies SI or HI.   Essential hypertension/HLD/morbid obesity: Pt reports compliance with amlodipine 10 mg QD. Blood pressures ranges at home are not routinely checked. Patient denies chest pain, shortness of breath or lower extremity edema. Pt takes a  daily baby ASA. Pt is  prescribed statin. H/O CVA.  BMP: collected today CBC: awaiting records, she reports full CPE 04/2017.  Lipdis: " Diet: Low sodium Exercise: not as routinely, but trying to get back into it.  RF: HTN, HLD, Obesity, h/o CVA, FHX MI  Hypothyroidism, unspecified type Very low dose 12.5 mg a day. Uncertain need for this medication. She reports she has been on it for about a year after an elevated tsh. May be an attempt to help with focus.  Traumatic brain injury with loss of consciousness, sequela (HCC)/CVA/MS: Pt reports h/o fall about 5 years ago in a [employer]. She slipped in fluid on the floor,     Depression screen  [facility] 2/9 01/16/2018  Decreased Interest 2  Down, Depressed, Hopeless 2  PHQ - 2 Score 4  Altered sleeping 2  Change in appetite 1  Feeling bad or failure about yourself  2  Trouble concentrating 2  Moving slowly or fidgety/restless 2  Suicidal thoughts 0  PHQ-9 Score 13   GAD 7 : Generalized Anxiety Score 01/16/2018  Nervous, Anxious, on Edge 1  Control/stop worrying 2  Worry too much - different things 2  Trouble relaxing 2  Restless 2  Easily annoyed or irritable 2  Afraid - awful might happen 2  Total GAD 7 Score 13       Fall Risk  01/16/2018  Falls in the past year? Yes  Comment Jan 2016  Number falls in past yr: 1  Injury with Fall? Yes      There is no immunization history on file for this patient.  No exam data present  Past Medical History:  Diagnosis Date  . Chicken pox   . Depression   . Frequent headaches   . GERD (gastroesophageal reflux disease)   . 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 Hyperlipidemia   . Hypertension   . Stroke ([place])   . Urinary incontinence    Not on File Past Surgical History:  Procedure Laterality Date  . BACK SURGERY  2016   herniated disc L4/5/6  . BREAST SURGERY  1988   biopsy  . SHOULDER SURGERY  02/13/2016   left shoulder   Family  History  Problem Relation Age of Onset  . Lung cancer Mother   . Alcohol abuse Father   . Throat cancer Father   . Lung cancer Father   . Depression Father   . Heart attack Father   . Breast cancer Sister   . Alcohol abuse Brother   . Diabetes Brother   . Mental illness Sister    Social History   Socioeconomic History  . Marital status: Single    Spouse name: Not on file  . Number of children: Not on file  . Years of education: Not on file  . Highest education level: Not on file  Occupational History  . Not on file  Social Needs  . Financial resource strain: Not on file  . Food insecurity:    Worry: Not on file    Inability: Not on file  . Transportation needs:    Medical:  Not on file    Non-medical: Not on file  Tobacco Use  . Smoking status: Former Research scientist (life sciences)  . Smokeless tobacco: Never Used  . Tobacco comment: quit 6 years ago  Substance and Sexual Activity  . Alcohol use: Yes    Alcohol/week: 0.6 oz    Types: 1 Glasses of wine per week    Comment: twice a month  . Drug use: Never  . Sexual activity: Not on file  Lifestyle  . Physical activity:    Days per week: Not on file    Minutes per session: Not on file  . Stress: Not on file  Relationships  . Social connections:    Talks on phone: Not on file    Gets together: Not on file    Attends religious service: Not on file    Active member of club or organization: Not on file    Attends meetings of clubs or organizations: Not on file    Relationship status: Not on file  . Intimate partner violence:    Fear of current or ex partner: Not on file    Emotionally abused: Not on file    Physically abused: Not on file    Forced sexual activity: Not on file  Other Topics Concern  . Not on file  Social History Narrative  . Not on file   Allergies as of 01/16/2018   Not on File     Medication List        Accurate as of 01/16/18 11:49 AM. Always use your most recent med list.          amLODipine 10 MG tablet Commonly known as:  NORVASC Take 10 mg by mouth daily.   aspirin EC 81 MG tablet Take 81 mg by mouth daily.   atorvastatin 40 MG tablet Commonly known as:  LIPITOR Take 40 mg by mouth daily.   esomeprazole 20 MG capsule Commonly known as:  NEXIUM Take 20 mg by mouth daily at 12 noon.   FLUoxetine 40 MG capsule Commonly known as:  PROZAC Take 1 capsule (40 mg total) by mouth daily.   levothyroxine 25 MCG tablet Commonly known as:  SYNTHROID, LEVOTHROID Take 25 mcg by mouth daily before breakfast.   Magnesium 500 MG Tabs Take by mouth.   ranitidine 300 MG tablet Commonly known as:  ZANTAC Take 300 mg by mouth at bedtime.   TURMERIC PO Take by mouth.       All past  medical history, surgical history, allergies, family history, immunizations andmedications were updated in the EMR today and reviewed under  the history and medication portions of their EMR.    No results found for this or any previous visit (from the past 2160 hour(s)).  Patient was never admitted.   ROS: 14 pt review of systems performed and negative (unless mentioned in an HPI)  Objective: BP 123/79 (BP Location: Right Arm, Patient Position: Sitting, Cuff Size: Normal)   Pulse (!) 56   Temp 97.6 F (36.4 C) (Oral)   Ht 5' 1" (1.549 m)   Wt 166 lb 12.8 oz (75.7 kg)   SpO2 100%   BMI 31.52 kg/m  Gen: Afebrile. No acute distress. Nontoxic in appearance, well-developed, well-nourished,  Pleasant, obese, caucasian female.  HENT: AT. [place]. Bilateral TM visualized and normal in appearance, normal external auditory canal. MMM, no oral lesions, adequate dentition. no Cough on exam, no hoarseness on exam. Eyes:Pupils Equal Round Reactive to light, Extraocular movements intact,  Conjunctiva without redness, discharge or icterus. Neck/lymp/endocrine: Supple,no lymphadenopathy, no thyromegaly CV: RRR no murmur, no edema, +2/4 P posterior tibialis pulses.   Chest: CTAB, no wheeze, rhonchi or crackles.  Abd: Soft. NTND. BS present.  Skin: Warm and well-perfused. Skin intact. Neuro/Msk:  Normal gait. PERLA. EOMi. Alert. Oriented x3.   Psych: Tearful. Word finding difficulties at times.Normal affect, dress and demeanor. Normal speech. Normal thought content and judgment.   Assessment/plan: Abigail Michael is a 67 y.o. female present for establishment.  Depression with anxiety - seems to be worsening. Many life changes over the last year. Now living with her daughter and grandchildren  in [place] seems to be helpful. Discussed many options with her today. She decided on therapy and increase in current medicine dose.  - increase prozac to 40 mg a day. She has been on lower dose for over a year after cva.  Would want to maximize therapy with this medication for changing.  - cmp to eval kidney fx on meds.  - Ambulatory referral to Psychology  Essential hypertension/morbid obesity/HLD - stable today. Awaiting records to verify regimen.  - Comp Met (CMET)  H/O TBI, CVA and MS:  - per pt. Awaiting records to verify diagnosis and management course taken up until now.  - will refer to neurology and possibly cardiology after records received.  - Continue ASA 81 and statin.   Hypothyroidism, unspecified type - could be contributing to focus issues, however suspect her symptoms are from long term sequela of TBI and / or CVA.  She is on very low dose, uncertain when last level collected.  - TSH    Return in about 1 month (around 02/13/2018). Greater than 45 minutes was spent with patient, greater than 50% of that time was spent face-to-face with patient counseling and coordinating care.     Note is dictated utilizing voice recognition software. Although note has been proof read prior to signing, occasional typographical errors still can be missed. If any questions arise, please do not hesitate to call for verification.  Electronically signed by: Howard Pouch, DO [facility]

## 2018-01-17 ENCOUNTER — Telehealth: Payer: Self-pay | Admitting: Family Medicine

## 2018-01-17 MED ORDER — LEVOTHYROXINE SODIUM 25 MCG PO TABS: 12.5000 ug | ORAL_TABLET | Freq: Every day | ORAL | 3 refills | Status: DC

## 2018-01-17 NOTE — Telephone Encounter (Signed)
Spoke with patient reviewed lab results and instructions. Patient verbalized understanding. 

## 2018-01-17 NOTE — Telephone Encounter (Signed)
Please call pt: - her thyroid is function is in normal range. I have refilled her levothyroxine for her.  - her liver and kidney are functioning normal. Her calcium is just very mildly elevated at 10.6 (10.5 nl). If she is taking extra calcium she should lower the dose just a little bit. Again, VERY mildly elevated could also be from fasting state and not being hydrated. We will monitor in 3-6 months at her routine appt to ensure not continuing to elevate.

## 2018-02-09 ENCOUNTER — Encounter: Payer: Self-pay | Admitting: Family Medicine

## 2018-02-17 ENCOUNTER — Encounter: Payer: Self-pay | Admitting: Family Medicine

## 2018-02-17 ENCOUNTER — Ambulatory Visit (INDEPENDENT_AMBULATORY_CARE_PROVIDER_SITE_OTHER): Payer: Medicare Other | Admitting: Family Medicine

## 2018-02-17 VITALS — BP 126/76 | HR 61 | Temp 98.0°F | Resp 20 | Ht 61.0 in | Wt 168.0 lb

## 2018-02-17 DIAGNOSIS — I10 Essential (primary) hypertension: Secondary | ICD-10-CM

## 2018-02-17 DIAGNOSIS — R9082 White matter disease, unspecified: Secondary | ICD-10-CM | POA: Diagnosis not present

## 2018-02-17 DIAGNOSIS — E039 Hypothyroidism, unspecified: Secondary | ICD-10-CM | POA: Diagnosis not present

## 2018-02-17 DIAGNOSIS — S069X9S Unspecified intracranial injury with loss of consciousness of unspecified duration, sequela: Secondary | ICD-10-CM

## 2018-02-17 DIAGNOSIS — F418 Other specified anxiety disorders: Secondary | ICD-10-CM | POA: Diagnosis not present

## 2018-02-17 MED ORDER — ESOMEPRAZOLE MAGNESIUM 20 MG PO CPDR: 20.0000 mg | DELAYED_RELEASE_CAPSULE | Freq: Every day | ORAL | 3 refills | Status: DC

## 2018-02-17 MED ORDER — FLUOXETINE HCL 40 MG PO CAPS: 40.0000 mg | ORAL_CAPSULE | Freq: Every day | ORAL | 0 refills | Status: DC

## 2018-02-17 MED ORDER — AMLODIPINE BESYLATE 10 MG PO TABS: 10.0000 mg | ORAL_TABLET | Freq: Every day | ORAL | 1 refills | Status: DC

## 2018-02-17 MED ORDER — RANITIDINE HCL 300 MG PO TABS: 150.0000 mg | ORAL_TABLET | Freq: Two times a day (BID) | ORAL | 3 refills | Status: DC

## 2018-02-17 MED ORDER — ATORVASTATIN CALCIUM 40 MG PO TABS: 40.0000 mg | ORAL_TABLET | Freq: Every day | ORAL | 3 refills | Status: DC

## 2018-02-17 NOTE — Patient Instructions (Signed)
You look great. I am glad you are feeling better. I am placing the neuro referral for you. You should be hearing from them soon to schedule.   Follow here in August for your physical.    Please help [place] help you:  We are honored you have chosen [facility] for your Primary Care home. Below you will find basic instructions that you may need to access in the future. Please help [place] help you by reading the instructions, which cover many of the frequent questions we experience.   Prescription refills and request:  -In order to allow more efficient response time, please call your pharmacy for all refills. They will forward the request electronically to [place]. This allows for the quickest possible response. Request left on a nurse line can take longer to refill, since these are checked as time allows between office patients and other phone calls.  - refill request can take up to 3-5 working days to complete.  - If request is sent electronically and request is appropiate, it is usually completed in 1-2 business days.  - all patients will need to be seen routinely for all chronic medical conditions requiring prescription medications (see follow-up below). If you are overdue for follow up on your condition, you will be asked to make an appointment and we will call in enough medication to cover you until your appointment (up to 30 days).  - all controlled substances will require a face to face visit to request/refill.  - if you desire your prescriptions to go through a new pharmacy, and have an active script at original pharmacy, you will need to call your pharmacy and have scripts transferred to new pharmacy. This is completed between the pharmacy locations and not by your provider.    Results: If any images or labs were ordered, it can take up to 1 week to get results depending on the test ordered and the lab/facility running and resulting the test. - Normal or stable results, which do not need further  discussion, may be released to your mychart immediately with attached note to you. A call may not be generated for normal results. Please make certain to sign up for mychart. If you have questions on how to activate your mychart you can call the front office.  - If your results need further discussion, our office will attempt to contact you via phone, and if unable to reach you after 2 attempts, we will release your abnormal result to your mychart with instructions.  - All results will be automatically released in mychart after 1 week.  - Your provider will provide you with explanation and instruction on all relevant material in your results. Please keep in mind, results and labs may appear confusing or abnormal to the untrained eye, but it does not mean they are actually abnormal for you personally. If you have any questions about your results that are not covered, or you desire more detailed explanation than what was provided, you should make an appointment with your provider to do so.   Our office handles many outgoing and incoming calls daily. If we have not contacted you within 1 week about your results, please check your mychart to see if there is a message first and if not, then contact our office.  In helping with this matter, you help decrease call volume, and therefore allow [place] to be able to respond to patients needs more efficiently.   Acute office visits (sick visit):  An acute  visit is intended for a new problem and are scheduled in shorter time slots to allow schedule openings for patients with new problems. This is the appropriate visit to discuss a new problem. In order to provide you with excellent quality medical care with proper time for you to explain your problem, have an exam and receive treatment with instructions, these appointments should be limited to one new problem per visit. If you experience a new problem, in which you desire to be addressed, please make an acute office visit,  we save openings on the schedule to accommodate you. Please do not save your new problem for any other type of visit, let us take care of it properly and quickly for you.   Follow up visits:  Depending on your condition(s) your provider will need to see you routinely in order to provide you with quality care and prescribe medication(s). Most chronic conditions (Example: hypertension, Diabetes, depression/anxiety... etc), require visits a couple times a year. Your provider will instruct you on proper follow up for your personal medical conditions and history. Please make certain to make follow up appointments for your condition as instructed. Failing to do so could result in lapse in your medication treatment/refills. If you request a refill, and are overdue to be seen on a condition, we will always provide you with a 30 day script (once) to allow you time to schedule.    Medicare wellness (well visit): - we have a wonderful Nurse Maudie Mercury), that will meet with you and provide you will yearly medicare wellness visits. These visits should occur yearly (can not be scheduled less than 1 calendar year apart) and cover preventive health, immunizations, advance directives and screenings you are entitled to yearly through your medicare benefits. Do not miss out on your entitled benefits, this is when medicare will pay for these benefits to be ordered for you.  These are strongly encouraged by your provider and is the appropriate type of visit to make certain you are up to date with all preventive health benefits. If you have not had your medicare wellness exam in the last 12 months, please make certain to schedule one by calling the office and schedule your medicare wellness with Maudie Mercury as soon as possible.   Yearly physical (well visit):  - Adults are recommended to be seen yearly for physicals. Check with your insurance and date of your last physical, most insurances require one calendar year between physicals.  Physicals include all preventive health topics, screenings, medical exam and labs that are appropriate for gender/age and history. You may have fasting labs needed at this visit. This is a well visit (not a sick visit), new problems should not be covered during this visit (see acute visit).  - Pediatric patients are seen more frequently when they are younger. Your provider will advise you on well child visit timing that is appropriate for your their age. - This is not a medicare wellness visit. Medicare wellness exams do not have an exam portion to the visit. Some medicare companies allow for a physical, some do not allow a yearly physical. If your medicare allows a yearly physical you can schedule the medicare wellness with our nurse Maudie Mercury and have your physical with your provider after, on the same day. Please check with insurance for your full benefits.   Late Policy/No Shows:  - all new patients should arrive 15-30 minutes earlier than appointment to allow [place] time  to  obtain all personal demographics,  insurance information  and for you to complete office paperwork. - All established patients should arrive 10-15 minutes earlier than appointment time to update all information and be checked in .  - In our best efforts to run on time, if you are late for your appointment you will be asked to either reschedule or if able, we will work you back into the schedule. There will be a wait time to work you back in the schedule,  depending on availability.  - If you are unable to make it to your appointment as scheduled, please call 24 hours ahead of time to allow [place] to fill the time slot with someone else who needs to be seen. If you do not cancel your appointment ahead of time, you may be charged a no show fee.

## 2018-02-17 NOTE — Progress Notes (Signed)
Patient ID: Abigail Michael, female  DOB: June 13, 1951, 67 y.o.   MRN: [id number] Patient Care Team    Relationship Specialty Notifications Start End  Ma Hillock, DO PCP - General Family Medicine  01/16/18     Chief Complaint  Patient presents with  . Depression  . Hypertension  . Anxiety    Subjective:  Abigail Michael is a 67 y.o.  female present for new patient establishment. All past medical history, surgical history, allergies, family history, immunizations, medications and social history were obtained/updated in the electronic medical record today. All recent labs, ED visits and hospitalizations within the last year were reviewed.  Depression with anxiety Patient reports she is doing better on the increased dose of Prozac.  Has her appointment with Dr. [name] this coming week. Prior note:  Has been on prozac for a little over a year after CVA. She reports she feels her depression and anxiety are worsening. She has had many life changes. Moved from [place] last year to be close to family. Currently living with her daughter and enjoying being close to her family. Having trouble adjusting after retiring, moving and CVA last year. She dies SI or HI.   Essential hypertension/HLD/morbid obesity: Pt reports compliance with amlodipine 10 mg QD. Blood pressures ranges at home are not routinely checked. Patient denies chest pain, shortness of breath or lower extremity edema. Pt takes a  daily baby ASA. Pt is  prescribed statin. H/O CVA.  BMP: 01/16/2018 mildly elevated calcium at 10.6, normal GFR CBC: 06/07/2017 within normal limits Lipdis: 06/07/2017 HDL 85, LDL 75, triglycerides 102, total cholesterol 180 TSH: 01/16/2018 3.04 Diet: Low sodium Exercise: not as routinely, but trying to get back into it.  RF: HTN, HLD, Obesity, h/o CVA, FHX MI   Depression screen [facility] 2/9 02/17/2018 01/16/2018  Decreased Interest 0 2  Down, Depressed, Hopeless 0 2  PHQ - 2 Score 0 4  Altered sleeping 0  2  Tired, decreased energy 0 -  Change in appetite 0 1  Feeling bad or failure about yourself  0 2  Trouble concentrating 0 2  Moving slowly or fidgety/restless 0 2  Suicidal thoughts 0 0  PHQ-9 Score 0 13   GAD 7 : Generalized Anxiety Score 01/16/2018  Nervous, Anxious, on Edge 1  Control/stop worrying 2  Worry too much - different things 2  Trouble relaxing 2  Restless 2  Easily annoyed or irritable 2  Afraid - awful might happen 2  Total GAD 7 Score 13       Fall Risk  01/16/2018  Falls in the past year? Yes  Comment Jan 2016  Number falls in past yr: 1  Injury with Fall? Yes  Risk for fall due to : History of fall(s)  Follow up Follow up appointment      There is no immunization history on file for this patient.  No exam data present  Past Medical History:  Diagnosis Date  . Chicken pox   . Depression   . Frequent headaches   . GERD (gastroesophageal reflux disease)   . 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 History of stroke 11/30/2016  . Hyperlipidemia   . Hypertension   . MS (multiple sclerosis) ([place]) 12/15/2016   possible dx. many MRI w/ positive findings.   Marland Kitchen TBI (traumatic brain injury) ([place]) 2014   fall at [facility], hit head off of floor. No bleed, "concussion". Seen neurology since for focus and word  finding.   Marland Kitchen Urinary incontinence    Allergies  Allergen Reactions  . Bee Venom Anaphylaxis  . Amoxicillin     MODERATE   Past Surgical History:  Procedure Laterality Date  . BACK SURGERY  02/2015   herniated disc L4/5/6  . BREAST BIOPSY  1988   biopsy  . LAPAROSCOPIC HYSTERECTOMY    . SHOULDER ARTHROSCOPY W/ LABRAL REPAIR Left 02/13/2016   left shoulder   Family History  Problem Relation Age of Onset  . Lung cancer Mother   . Alcohol abuse Father   . Throat cancer Father   . Lung cancer Father   . Depression Father   . Heart attack Father   . Breast cancer Sister   . Alcohol abuse Brother   . Diabetes Brother   . Mental illness Sister      Social History   Socioeconomic History  . Marital status: Single    Spouse name: Not on file  . Number of children: Not on file  . Years of education: Not on file  . Highest education level: Not on file  Occupational History  . Occupation: retired  Scientific laboratory technician  . Financial resource strain: Not on file  . Food insecurity:    Worry: Not on file    Inability: Not on file  . Transportation needs:    Medical: Not on file    Non-medical: Not on file  Tobacco Use  . Smoking status: Former Smoker    Packs/day: 1.00    Years: 19.00    Pack years: 19.00    Last attempt to quit: 2012    Years since quitting: 7.3  . Smokeless tobacco: Never Used  . Tobacco comment: quit 6 years ago  Substance and Sexual Activity  . Alcohol use: Yes    Alcohol/week: 0.6 oz    Types: 1 Glasses of wine per week    Comment: twice a month  . Drug use: Never  . Sexual activity: Not Currently    Partners: Male  Lifestyle  . Physical activity:    Days per week: Not on file    Minutes per session: Not on file  . Stress: Not on file  Relationships  . Social connections:    Talks on phone: Not on file    Gets together: Not on file    Attends religious service: Not on file    Active member of club or organization: Not on file    Attends meetings of clubs or organizations: Not on file    Relationship status: Not on file  . Intimate partner violence:    Fear of current or ex partner: Not on file    Emotionally abused: Not on file    Physically abused: Not on file    Forced sexual activity: Not on file  Other Topics Concern  . Not on file  Social History Narrative   Single. Lived in [place] area, moved to [place] to be close to her daughters 2018. Lives with her daughter.   Some college, worked as Programmer, systems. Retired.    Social drinker. Former smoker.   Exercises routinely.    Wears dentures.    Drinks caffeine, takes a daily vitamin.   Smoke alarm in the home. Wears her seatbelt.    Feels safe in  her relationships.    Allergies as of 02/17/2018      Reactions   Amoxicillin    MODERATE   Bee Venom       Medication  List        Accurate as of 02/17/18 11:59 PM. Always use your most recent med list.          amLODipine 10 MG tablet Commonly known as:  NORVASC Take 1 tablet (10 mg total) by mouth daily.   aspirin EC 81 MG tablet Take 81 mg by mouth daily.   atorvastatin 40 MG tablet Commonly known as:  LIPITOR Take 1 tablet (40 mg total) by mouth daily.   EPINEPHrine 0.3 mg/0.3 mL Soaj injection Commonly known as:  EPI-PEN Inject 0.3 mg into the skin as needed.   esomeprazole 20 MG capsule Commonly known as:  NEXIUM Take 1 capsule (20 mg total) by mouth daily at 12 noon.   FLUoxetine 40 MG capsule Commonly known as:  PROZAC Take 1 capsule (40 mg total) by mouth daily.   levothyroxine 25 MCG tablet Commonly known as:  SYNTHROID, LEVOTHROID Take 0.5 tablets (12.5 mcg total) by mouth daily before breakfast.   Magnesium 500 MG Tabs Take by mouth.   ranitidine 300 MG tablet Commonly known as:  ZANTAC Take 0.5 tablets (150 mg total) by mouth 2 (two) times daily.   TURMERIC PO Take by mouth.       All past medical history, surgical history, allergies, family history, immunizations andmedications were updated in the EMR today and reviewed under the history and medication portions of their EMR.     Recent Results (from the past 2160 hour(s))  TSH     Status: None   Collection Time: 01/16/18 11:51 AM  Result Value Ref Range   TSH 3.04 0.35 - 4.50 uIU/mL  Comp Met (CMET)     Status: Abnormal   Collection Time: 01/16/18 11:51 AM  Result Value Ref Range   Sodium 139 135 - 145 mEq/L   Potassium 5.0 3.5 - 5.1 mEq/L   Chloride 104 96 - 112 mEq/L   CO2 29 19 - 32 mEq/L   Glucose, Bld 125 (H) 70 - 99 mg/dL   BUN 13 6 - 23 mg/dL   Creatinine, Ser 0.83 0.40 - 1.20 mg/dL   Total Bilirubin 0.5 0.2 - 1.2 mg/dL   Alkaline Phosphatase 70 39 - 117 U/L   AST 15 0 - 37  U/L   ALT 17 0 - 35 U/L   Total Protein 7.1 6.0 - 8.3 g/dL   Albumin 4.5 3.5 - 5.2 g/dL   Calcium 10.6 (H) 8.4 - 10.5 mg/dL   GFR 72.82 >60.00 mL/min    Patient was never admitted.   ROS: 14 pt review of systems performed and negative (unless mentioned in an HPI)  Objective: BP 126/76 (BP Location: Left Arm, Patient Position: Sitting, Cuff Size: Normal)   Pulse 61   Temp 98 F (36.7 C)   Resp 20   Ht '5\' 1"'  (1.549 m)   Wt 168 lb (76.2 kg)   LMP 10/18/1990   SpO2 98%   BMI 31.74 kg/m  Gen: Afebrile. No acute distress.  Toxic appearance, well-developed, well-nourished, Caucasian female.  Very pleasant. HENT: AT. [place].  MMM.  Eyes:Pupils Equal Round Reactive to light, Extraocular movements intact,  Conjunctiva without redness, discharge or icterus. CV: RRR no murmur, no edema, +2/4 P posterior tibialis pulses Chest: CTAB, no wheeze or crackles Neuro:  Normal gait. PERLA. EOMi. Alert. Oriented x3 Psych: Normal affect, dress and demeanor. Normal speech. Normal thought content and judgment..   Assessment/plan: Abigail Michael is a 67 y.o. female present for establishment.  Depression with  anxiety -Stable.  Refills provided on Prozac 40 mg daily.   -Schedule appointment with Dr. [name] psychology. -Follow-up in 6 months long as doing well, sooner if needed  Essential hypertension/morbid obesity/HLD -Stable today.  Refills provided on medications. -Lipids due August. F/u 6 mos  H/O TBI, TIA (no MRI findings) and MS/white matter disease:  - Neurology referral placed today to [facility]. MRI results uploaded to media. Seems to be a consistent white matter disease finding on multiple MRI- not formerly diagnosed with MS but last neuro notes reflect it is a definite possibility.  - Continue ASA 81 and statin.    Return in about 6 months (around 08/20/2018) for HTN'.  Note is dictated utilizing voice recognition software. Although note has been proof read prior to signing,  occasional typographical errors still can be missed. If any questions arise, please do not hesitate to call for verification.  Electronically signed by: Howard Pouch, DO [place]

## 2018-02-20 ENCOUNTER — Encounter: Payer: Self-pay | Admitting: Family Medicine

## 2018-02-20 DIAGNOSIS — M810 Age-related osteoporosis without current pathological fracture: Secondary | ICD-10-CM | POA: Insufficient documentation

## 2018-02-20 DIAGNOSIS — R9082 White matter disease, unspecified: Secondary | ICD-10-CM | POA: Insufficient documentation

## 2018-02-22 ENCOUNTER — Ambulatory Visit (INDEPENDENT_AMBULATORY_CARE_PROVIDER_SITE_OTHER): Payer: Medicare Other | Admitting: Clinical

## 2018-02-22 DIAGNOSIS — F4323 Adjustment disorder with mixed anxiety and depressed mood: Secondary | ICD-10-CM

## 2018-04-04 ENCOUNTER — Ambulatory Visit: Payer: Federal, State, Local not specified - PPO | Admitting: Clinical

## 2018-04-09 DIAGNOSIS — R05 Cough: Secondary | ICD-10-CM | POA: Diagnosis not present

## 2018-04-09 DIAGNOSIS — J019 Acute sinusitis, unspecified: Secondary | ICD-10-CM | POA: Diagnosis not present

## 2018-04-09 DIAGNOSIS — I10 Essential (primary) hypertension: Secondary | ICD-10-CM | POA: Diagnosis not present

## 2018-04-09 DIAGNOSIS — R0981 Nasal congestion: Secondary | ICD-10-CM | POA: Diagnosis not present

## 2018-04-09 DIAGNOSIS — J029 Acute pharyngitis, unspecified: Secondary | ICD-10-CM | POA: Diagnosis not present

## 2018-05-08 ENCOUNTER — Telehealth: Payer: Self-pay | Admitting: *Deleted

## 2018-05-08 ENCOUNTER — Other Ambulatory Visit: Payer: Self-pay | Admitting: *Deleted

## 2018-05-08 ENCOUNTER — Ambulatory Visit (INDEPENDENT_AMBULATORY_CARE_PROVIDER_SITE_OTHER): Payer: Medicare Other | Admitting: Clinical

## 2018-05-08 DIAGNOSIS — F4323 Adjustment disorder with mixed anxiety and depressed mood: Secondary | ICD-10-CM

## 2018-05-08 MED ORDER — OMEPRAZOLE 20 MG PO CPDR: 20.0000 mg | DELAYED_RELEASE_CAPSULE | Freq: Every day | ORAL | 1 refills | Status: DC

## 2018-05-08 NOTE — Telephone Encounter (Signed)
Patient signed a record release today. I faxed the form

## 2018-05-08 NOTE — Telephone Encounter (Signed)
Copied from [place] [id number]. Topic: Referral - Status >> May 08, 2018  2:38 PM Nils Flack wrote: Reason for CRM: pt called to give previous neurologist info:  [facility] [place], [place]

## 2018-05-23 ENCOUNTER — Telehealth: Payer: Self-pay | Admitting: Family Medicine

## 2018-05-23 MED ORDER — ESOMEPRAZOLE MAGNESIUM 20 MG PO CPDR: 20.0000 mg | DELAYED_RELEASE_CAPSULE | Freq: Every day | ORAL | 1 refills | Status: DC

## 2018-05-23 NOTE — Telephone Encounter (Signed)
Patient states refill was sent in for omeprazole (PRILOSEC) 20 MG capsule.  However, she cannot take this medication due to the cognitive side effects she experiences while taking this medication.   She is requesting a new script for esomeprazone 20mg  capsule that was previously filled by her prior physician.  Pharmacy - [facility]

## 2018-05-23 NOTE — Telephone Encounter (Signed)
Refilled nexium in place of prilosec. However, I can not guarantee her insurance will cover.

## 2018-05-23 NOTE — Telephone Encounter (Signed)
Patient notified and verbalized understanding. 

## 2018-05-25 ENCOUNTER — Encounter: Payer: Self-pay | Admitting: *Deleted

## 2018-05-25 ENCOUNTER — Encounter: Payer: Self-pay | Admitting: Family Medicine

## 2018-05-26 DIAGNOSIS — R9082 White matter disease, unspecified: Secondary | ICD-10-CM | POA: Diagnosis not present

## 2018-05-26 DIAGNOSIS — S060X0S Concussion without loss of consciousness, sequela: Secondary | ICD-10-CM | POA: Diagnosis not present

## 2018-05-26 DIAGNOSIS — Z6830 Body mass index (BMI) 30.0-30.9, adult: Secondary | ICD-10-CM | POA: Diagnosis not present

## 2018-05-31 ENCOUNTER — Telehealth: Payer: Self-pay | Admitting: Family Medicine

## 2018-05-31 ENCOUNTER — Encounter: Payer: Self-pay | Admitting: Family Medicine

## 2018-05-31 NOTE — Telephone Encounter (Signed)
I have received pts neurology records from the [facility] and updated her chart. - I had referred her to neuro to establish here (she just moved here)--> reason for TIA, cognitive impairment and potential demyelinating/MS condition that was being followed routinely by prior neuro.   I see she was scheduled 8/9? I did not receive correspondence from neuro she was referred to here. Please call pt: 1. Did she get established with new neuro? 2. Do they need the records from her prior neuro we have, if so we can provide her with a copy and she can give to them? Or They will also be uploaded in the [facility] system (sent to scan), if they can view there. 3. If she did not get established on 8/9, does she need a new referral?

## 2018-05-31 NOTE — Telephone Encounter (Signed)
Pt returned call, Abigail Michael wasn't available, pt says that she did have apt with Neuro on 8/9 at [facility] and it went very well. Pt says that she will have to have a 3-4 hour test completed (pt cant think of the name of the test) but says that it is scheduled for December. Pt says that she is going to look into finding a neuro that is closer to see if they are able to complete test sooner. Pt says that they had a lot of her medical records, provider didn't mention needed records from PCP.   Pt says if office would like to discuss with her further they are welcomed to give her a call back.

## 2018-05-31 NOTE — Telephone Encounter (Signed)
Left message for patient to return call.

## 2018-06-23 ENCOUNTER — Other Ambulatory Visit: Payer: Self-pay | Admitting: *Deleted

## 2018-06-23 MED ORDER — FLUOXETINE HCL 40 MG PO CAPS: 40.0000 mg | ORAL_CAPSULE | Freq: Every day | ORAL | 0 refills | Status: DC

## 2018-07-05 ENCOUNTER — Ambulatory Visit (INDEPENDENT_AMBULATORY_CARE_PROVIDER_SITE_OTHER): Payer: Medicare Other | Admitting: Clinical

## 2018-07-05 DIAGNOSIS — F4323 Adjustment disorder with mixed anxiety and depressed mood: Secondary | ICD-10-CM

## 2018-07-12 ENCOUNTER — Telehealth: Payer: Self-pay | Admitting: Family Medicine

## 2018-07-12 MED ORDER — FLUOXETINE HCL 40 MG PO CAPS: 40.0000 mg | ORAL_CAPSULE | Freq: Every day | ORAL | 0 refills | Status: DC

## 2018-07-12 NOTE — Telephone Encounter (Signed)
Copied from [place] [phone]. Topic: Quick Communication - See Telephone Encounter >> Jul 12, 2018 11:39 AM Marja Kays F wrote: Pt is needing a refill on fluoxetine   [facility] number [phone]

## 2018-07-12 NOTE — Telephone Encounter (Signed)
Fluoxetine refill Last Refill: 06/23/18; # 30; no refills Last OV: 02/17/18 PCP: [place]: [place]  Refilled x one month to get pt. through until f/u appt. on 08/18/18.

## 2018-08-07 ENCOUNTER — Ambulatory Visit (INDEPENDENT_AMBULATORY_CARE_PROVIDER_SITE_OTHER): Payer: Medicare Other | Admitting: Clinical

## 2018-08-07 ENCOUNTER — Other Ambulatory Visit: Payer: Self-pay | Admitting: *Deleted

## 2018-08-07 ENCOUNTER — Telehealth: Payer: Self-pay | Admitting: Family Medicine

## 2018-08-07 DIAGNOSIS — F4323 Adjustment disorder with mixed anxiety and depressed mood: Secondary | ICD-10-CM

## 2018-08-07 MED ORDER — FLUOXETINE HCL 40 MG PO CAPS: 40.0000 mg | ORAL_CAPSULE | Freq: Every day | ORAL | 0 refills | Status: DC

## 2018-08-07 NOTE — Telephone Encounter (Signed)
Patient requesting refill of FLUoxetine (PROZAC) 40 MG capsule.  States she is leaving to travel to [place] tomorrow, she will be there for 2 weeks and will run out of medication before she returns.   [facility]:  [id number] [place], [place] [phone] (Phone) [phone] (Fax)

## 2018-08-07 NOTE — Telephone Encounter (Signed)
30 day supply sent to patient pharmacy. No additional refills until patient seen.

## 2018-08-14 ENCOUNTER — Other Ambulatory Visit: Payer: Self-pay

## 2018-08-14 MED ORDER — AMLODIPINE BESYLATE 10 MG PO TABS: 10.0000 mg | ORAL_TABLET | Freq: Every day | ORAL | 0 refills | Status: DC

## 2018-08-18 ENCOUNTER — Ambulatory Visit: Payer: Federal, State, Local not specified - PPO | Admitting: Family Medicine

## 2018-08-22 ENCOUNTER — Ambulatory Visit: Payer: Medicare Other | Attending: *Deleted | Admitting: Speech Pathology

## 2018-08-22 DIAGNOSIS — R2681 Unsteadiness on feet: Secondary | ICD-10-CM | POA: Diagnosis not present

## 2018-08-22 DIAGNOSIS — R2689 Other abnormalities of gait and mobility: Secondary | ICD-10-CM | POA: Insufficient documentation

## 2018-08-22 DIAGNOSIS — R41841 Cognitive communication deficit: Secondary | ICD-10-CM

## 2018-08-22 NOTE — Therapy (Signed)
[facility] [address] [place], [place], [address] Phone: [phone]   Fax:  [phone]  Speech Language Pathology Evaluation  Patient Details  Name: Abigail Michael MRN: [id number] Date of Birth: 04-16-1951 Referring Provider (SLP): Dr. Sharlene Dory   Encounter Date: 08/22/2018  End of Session - 08/22/18 1237    Visit Number  1    Number of Visits  17    Date for SLP Re-Evaluation  10/20/18    Authorization Type  VL PT, OT, ST is 45 - zeor used at time of ST eval - I am recommending PT eval    SLP Start Time  0840    SLP Stop Time   0932    SLP Time Calculation (min)  52 min    Activity Tolerance  Patient tolerated treatment well       Past Medical History:  Diagnosis Date  . 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 Chicken pox   . Constipation   . Depression   . Frequent headaches   . GERD (gastroesophageal reflux disease)   . 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 Hyperlipidemia   . Hypertension   . MS (multiple sclerosis) ([place]) 12/15/2016   possible dx. many MRI w/ positive white matter abnormality. MRI reported stable since 2015. mild progressive  Cognitive loss  . Obstructive sleep apnea    on prior neuro notes 10/2016; no results.   . Osteoporosis    had been on reclast  . Post concussion syndrome 10/2013   ongoing issues with concentration, memory loss, lability; EEG completed and normal.   . TBI (traumatic brain injury) ([place]) 2014   fall at [facility], hit head off of floor. No bleed, "concussion". Seen neurology since for focus and word finding.   [facility] TIA (transient ischemic attack) 11/30/2016   MRI normal. No lateralization. Speech affected and weakness during a hypotensive episdoe.   . Urinary incontinence     Past Surgical History:  Procedure Laterality Date  . BACK SURGERY  02/2015   herniated disc L4/5/6  . BREAST BIOPSY  1988   biopsy  . LAPAROSCOPIC  HYSTERECTOMY     partial   . SHOULDER ARTHROSCOPY W/ LABRAL REPAIR Left 02/13/2016   left shoulder  . Study: cognitive eval  06/23/2016   mild cog-linguistic impairment, difficulty with verbal fluency  . Study: EEG   04/12/2015   Studies: EEG completed and normal.   . Study: MRI  04/28/2016   Impression: numerous non-enhancing flair hyperintense lesions w/in the brain .Suspicious for demyelinating disease such as lyme or MS. Her Lyme test were negative.     There were no vitals filed for this visit.  Subjective Assessment - 08/22/18 1225    Subjective  Pt tearful. "I can't process information - I'm so slow"    Currently in Pain?  No/denies         SLP Evaluation [facility] - 08/22/18 0998      SLP Visit Information   SLP Received On  08/22/18    Referring Provider (SLP)  Dr. Sharlene Dory    Onset Date  2015 - concussion    Medical Diagnosis  post concussion syndrome      Subjective   Patient/Family Stated Goal  "So I can keep track of what I'm doing daily"      Pain Assessment   Currently in Pain?  No/denies      General  Information   HPI  Patient states she had a slip and fall on liquid on the floor 5 years ago in [place] when she was living in [place]. She hit her the back and right side of her head, but denies LOC. Initially had bad headaches which have since resolved. Continues to have difficulty with processing information, memory, word recall. Her daughter adds that if she is given a list of groceries to pick up at the store and then asked to make another stop, she gets too overwhelmed and frustrated and is unable to complete the task. She also has difficulty backing up the car. She is independent with mobility (does not use equipment), activities of daily living including meal prep, cooking, driving, laundry, etc. She was previously in SLP and OT after her fall, but has not established care with therapy since moving to [place] 1.5 years ago. She lives with her daughter,  son-in-law. For her questionable MS diagnosis, she states she had imaging done around the time of her fall which revealed lesions concerning for MS. She sought 3 different opinions by Neurologists and the diagnosis is unclear. She was referred for Neurometric testing but did not complete due to moving to [place]. She denies any loss of vision. She does feels she veers to one side when walking, but is able to walk unassisted. She has had falls in the past from loss of balance, but none recently. She states some days her arms and legs feel very heavy. Pt is currently having MS work up at [facility], including neuropsych testing    Mobility Status  walks independently      Mercedes   Has the patient fallen in the past 6 months  No    Has the patient had a decrease in activity level because of a fear of falling?   Yes    Is the patient reluctant to leave their home because of a fear of falling?   No      Prior Functional Status   Cognitive/Linguistic Baseline  Baseline deficits    Baseline deficit details  processing, word finding, memory    Type of [place] With  Daughter    Available Support  Family    Vocation  Retired      Associate Professor   Overall Cognitive Status  Impaired/Different from baseline    Area of Impairment  Attention;Memory;Problem solving   processing   Current Attention Level  Alternating    Memory  Decreased short-term memory    Problem Solving  Slow processing;Difficulty sequencing    Awareness  Appears intact    Problem Solving  Impaired    Problem Solving Impairment  Verbal complex;Functional complex    Journalist, newspaper Comprehension   Overall Auditory Comprehension  Appears within functional limits for tasks assessed      Reading Comprehension   Reading Status  Impaired   likely due to attention and processing   Word level  76-100% accurate    Sentence Level  76-100% accurate    Paragraph  Level  76-100% accurate    Functional Environmental (signs, name badge)  Not tested    Interfering Components  Attention;Processing time;Working Manufacturing engineer cueing      Expression   Primary Mode of Expression  Verbal      Written Expression   Dominant Hand  Right  Oral Motor/Sensory Function   Overall Oral Motor/Sensory Function  Appears within functional limits for tasks assessed      Motor Speech   Overall Motor Speech  Appears within functional limits for tasks assessed      Standardized Assessments   Standardized Assessments   Cognitive Linguistic Quick Test      Cognitive Linguistic Quick Test (Ages 18-69)   Attention  Mild    Memory  WNL    Executive Function  Mild    Language  WNL    Visuospatial Skills  WNL    Severity Rating Total  18    Composite Severity Rating  14.8                      SLP Education - 08/22/18 1236    Education Details  compensations for attention and slow processing; compensations for memory;     Person(s) Educated  Patient;Child(ren)   daughter Katharine Look   Methods  Explanation;Verbal cues;Handout    Comprehension  Verbalized understanding;Verbal cues required;Need further instruction       SLP Short Term Goals - 08/22/18 1248      SLP SHORT TERM GOAL #1   Title  Pt will utilize portable external memory/organization aids for high levl ADL's such as shopping and bill paying with rare min A over 2 sessions.    Time  4    Period  Weeks    Status  New      SLP SHORT TERM GOAL #2   Title  Pt and family will carryover compensations for higher level executive function tasks with occasional min A over 2 sessions (pre-plan trips, shopping, errands)     Time  4    Period  Weeks    Status  New      SLP SHORT TERM GOAL #3   Title  Formal naming assessment to be completed as indicated    Time  2    Period  Weeks    Status  New      SLP SHORT TERM GOAL #4   Title  Pt will utilize compensations to  process/recall what she has read with rare min A over 2 sessions.    Time  4    Period  Weeks    Status  New       SLP Long Term Goals - 08/22/18 1258      SLP LONG TERM GOAL #1   Title  Pt will use external aids to plan her day and complete high level ADL's with supervision cues over 3 sessions    Time  8    Period  Weeks    Status  New      SLP LONG TERM GOAL #2   Title  Pt will run 2 errands in a row using compensations with occasional min A over 2 sessions.       SLP LONG TERM GOAL #3   Title  Pt will complete high level executive function tasks with use of compensations, 85% accuracy and rare min A over 2 sessions    Time  8    Period  Weeks    Status  New      SLP LONG TERM GOAL #4   Title  Pt will retrieve 3 items from a store in less than 20 minutes with rare min A over 2 sessions.    Time  8    Period  Weeks    Status  New  Plan - 08/22/18 1239    Clinical Impression Statement  Ms. Bleecker is referred by her physician due to cognitive impaiments from fall with concussion 5 years ago, and neurology is completing w/u for MS.  Ms. Bordenave reports that she "forgets words" can't remember directions when driving, difficulty processing conversation and written materials and forgetting what she just read. She and her daughter Katharine Look both report pt is unable to shop on her own, becoming overwhelmed in stores and requiring extended time to revtrieve even 3 items. Ms. Maines is tearful throughout the evaluation, stating that she is so slow to process what she needs to do. 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 She is remembering her meds successfully at this time. The Cognitive Lingiustic Quick Test  (CLQT) revealed mild attention and executive function impairments. Divided attention on functional check writing/balacing task revealed reduced divided attention when I engaged her in simple conversation during the task, she  did double check independently and found and corrected her errors. Extended time required for check writng task. I recommend skilled ST to train pt in compensations for attention, processing, memory and executive function for improved independence, safety. She lives with her daughter and son in law, however daughter travels and stays at the [place] frequently for her business, so pt is left alone at times. I did not appreciate word finding difficulties today, however pt may benefit from formal naming assessment due to her complaint of forgetting words.     Speech Therapy Frequency  2x / week    Duration  --   8 weeks or 17 visits   Treatment/Interventions  SLP instruction and feedback;Cueing hierarchy;Environmental controls;Language facilitation;Compensatory strategies;Functional tasks;Cognitive reorganization;Compensatory techniques;Internal/external aids;Multimodal communcation approach;Patient/family education    Potential to Achieve Goals  Good    Consulted and Agree with Plan of Care  Patient;Family member/caregiver    Family Member Consulted  daugher, Katharine Look       Patient will benefit from skilled therapeutic intervention in order to improve the following deficits and impairments:   Cognitive communication deficit - Plan: SLP plan of care cert/re-cert    Problem List Patient Active Problem List   Diagnosis Date Noted  . White matter disease 02/20/2018  . Osteoporosis   . Hypercalcemia 01/17/2018  . Essential hypertension 01/16/2018  . Hypothyroidism 01/16/2018  . Depression with anxiety 01/16/2018  . Traumatic brain injury with loss of consciousness ([place]) 01/16/2018  . Hyperlipidemia     , Annye Rusk MS, CCC-SLP 08/22/2018, 1:04 PM  [facility] [address] [place], [place], [address] Phone: [phone]   Fax:  [phone]  Name: Tarrin Menn MRN: [id number] Date of Birth: 1951/01/24

## 2018-08-22 NOTE — Patient Instructions (Addendum)
  Get a binder with sections for PT and ST and put your therapy calendar in also - bring it with you to therapy   Tips to help facilitate better attention, concentration, focus   Do harder, longer tasks when you are most alert/awake  Break down larger tasks into small parts  Limit distractions of TV, radio, conversation, e mails/texts, appliance noise, etc - if a job is important, do it in a quiet room  Be aware of how you are functioning in high stimulation environments such as large stores, parties, restaurants - any place with lots of lights, noise, signs etc  Group conversations may be more difficult to process than one on one conversations  Give yourself extra time to process conversation, reading materials, directions or information from your healthcare providers  Organization is key - clutters of laundry, mail, paperwork, dirty dishes - all make it more difficult to concentrate  Before you start a task, have all the needed supplies, directions, recipes ready and organized. This way you don't have to go looking for something in the middle of a task and become distracted.   Be aware of fatigue - take rests or breaks when needed to re-group and re-focus   Cognitive Activities you can do at home:   - Solitaire  - Majong  - Scrabble  - Chess/Checkers  - Crosswords (easy level)  - [place]; scrabble, outburst jr, taboo, family feud (word finding games)  Consider Life Alert  On your computer, tablet or phone: [place] [employer] Spot the difference games  Keep a mini notebook to write down lists in the store or short to do's

## 2018-08-23 ENCOUNTER — Ambulatory Visit: Payer: Federal, State, Local not specified - PPO | Admitting: Clinical

## 2018-08-25 ENCOUNTER — Ambulatory Visit: Payer: Medicare Other | Admitting: Family Medicine

## 2018-08-28 ENCOUNTER — Encounter: Payer: Self-pay | Admitting: Family Medicine

## 2018-08-28 ENCOUNTER — Ambulatory Visit (INDEPENDENT_AMBULATORY_CARE_PROVIDER_SITE_OTHER): Payer: Medicare Other | Admitting: Family Medicine

## 2018-08-28 VITALS — BP 112/64 | HR 65 | Temp 97.9°F | Resp 18 | Ht 61.0 in | Wt 167.2 lb

## 2018-08-28 DIAGNOSIS — Z5181 Encounter for therapeutic drug level monitoring: Secondary | ICD-10-CM | POA: Diagnosis not present

## 2018-08-28 DIAGNOSIS — R2681 Unsteadiness on feet: Secondary | ICD-10-CM

## 2018-08-28 DIAGNOSIS — Z23 Encounter for immunization: Secondary | ICD-10-CM | POA: Diagnosis not present

## 2018-08-28 DIAGNOSIS — R7309 Other abnormal glucose: Secondary | ICD-10-CM | POA: Diagnosis not present

## 2018-08-28 DIAGNOSIS — I10 Essential (primary) hypertension: Secondary | ICD-10-CM

## 2018-08-28 DIAGNOSIS — S069X9S Unspecified intracranial injury with loss of consciousness of unspecified duration, sequela: Secondary | ICD-10-CM

## 2018-08-28 DIAGNOSIS — M810 Age-related osteoporosis without current pathological fracture: Secondary | ICD-10-CM | POA: Diagnosis not present

## 2018-08-28 DIAGNOSIS — R9082 White matter disease, unspecified: Secondary | ICD-10-CM | POA: Diagnosis not present

## 2018-08-28 DIAGNOSIS — Z79899 Other long term (current) drug therapy: Secondary | ICD-10-CM

## 2018-08-28 DIAGNOSIS — E782 Mixed hyperlipidemia: Secondary | ICD-10-CM

## 2018-08-28 LAB — COMPREHENSIVE METABOLIC PANEL
ALT: 25 U/L (ref 0–35)
AST: 17 U/L (ref 0–37)
Albumin: 4.4 g/dL (ref 3.5–5.2)
Alkaline Phosphatase: 107 U/L (ref 39–117)
BUN: 16 mg/dL (ref 6–23)
CHLORIDE: 105 meq/L (ref 96–112)
CO2: 30 mEq/L (ref 19–32)
CREATININE: 1.04 mg/dL (ref 0.40–1.20)
Calcium: 10.6 mg/dL — ABNORMAL HIGH (ref 8.4–10.5)
GFR: 56.03 mL/min — ABNORMAL LOW (ref >60.00)
GLUCOSE: 102 mg/dL — AB (ref 70–99)
POTASSIUM: 4.1 meq/L (ref 3.5–5.1)
SODIUM: 140 meq/L (ref 135–145)
Total Bilirubin: 0.6 mg/dL (ref 0.2–1.2)
Total Protein: 6.7 g/dL (ref 6.0–8.3)

## 2018-08-28 LAB — CBC WITH DIFFERENTIAL/PLATELET
BASOS ABS: 0 10*3/uL (ref 0.0–0.1)
BASOS PCT: 0.5 % (ref 0.0–3.0)
Eosinophils Absolute: 0.2 10*3/uL (ref 0.0–0.7)
Eosinophils Relative: 3.2 % (ref 0.0–5.0)
HEMATOCRIT: 43.2 % (ref 36.0–46.0)
HEMOGLOBIN: 14.7 g/dL (ref 12.0–15.0)
LYMPHS PCT: 16.2 % (ref 12.0–46.0)
Lymphs Abs: 1 10*3/uL (ref 0.7–4.0)
MCHC: 34 g/dL (ref 30.0–36.0)
MCV: 85.7 fl (ref 78.0–100.0)
Monocytes Absolute: 0.4 10*3/uL (ref 0.1–1.0)
Monocytes Relative: 6.9 % (ref 3.0–12.0)
Neutro Abs: 4.5 10*3/uL (ref 1.4–7.7)
Neutrophils Relative %: 73.2 % (ref 43.0–77.0)
Platelets: 204 10*3/uL (ref 150.0–400.0)
RBC: 5.05 Mil/uL (ref 3.87–5.11)
RDW: 12.9 % (ref 11.5–15.5)
WBC: 6.2 10*3/uL (ref 4.0–10.5)

## 2018-08-28 LAB — HEMOGLOBIN A1C: Hgb A1c MFr Bld: 5.6 % (ref 4.6–6.5)

## 2018-08-28 LAB — LIPID PANEL
Cholesterol: 151 mg/dL (ref 0–200)
HDL: 71.8 mg/dL (ref >39.00)
LDL Cholesterol: 62 mg/dL (ref 0–99)
NONHDL: 78.92
TRIGLYCERIDES: 83 mg/dL (ref 0.0–149.0)
Total CHOL/HDL Ratio: 2
VLDL: 16.6 mg/dL (ref 0.0–40.0)

## 2018-08-28 LAB — MAGNESIUM: MAGNESIUM: 2.2 mg/dL (ref 1.5–2.5)

## 2018-08-28 LAB — VITAMIN B12: VITAMIN B 12: 392 pg/mL (ref 211–911)

## 2018-08-28 LAB — VITAMIN D 25 HYDROXY (VIT D DEFICIENCY, FRACTURES): VITD: 35.97 ng/mL (ref 30.00–100.00)

## 2018-08-28 MED ORDER — FLUOXETINE HCL 40 MG PO CAPS: 40.0000 mg | ORAL_CAPSULE | Freq: Every day | ORAL | 0 refills | Status: DC

## 2018-08-28 MED ORDER — FAMOTIDINE 20 MG PO TABS: 20.0000 mg | ORAL_TABLET | Freq: Two times a day (BID) | ORAL | 3 refills | Status: DC

## 2018-08-28 MED ORDER — AMLODIPINE BESYLATE 10 MG PO TABS: 10.0000 mg | ORAL_TABLET | Freq: Every day | ORAL | 0 refills | Status: DC

## 2018-08-28 NOTE — Progress Notes (Signed)
Patient ID: Abigail Michael, female  DOB: 1951-09-25, 67 y.o.   MRN: [id number] Patient Care Team    Relationship Specialty Notifications Start End  Ma Hillock, DO PCP - General Family Medicine  01/16/18     Chief Complaint  Patient presents with  . Hypertension  . Depression  . Anxiety    Subjective:  Abigail Michael is a 67 y.o.  female present for new patient establishment. All past medical history, surgical history, allergies, family history, immunizations, medications and social history were obtained/updated in the electronic medical record today. All recent labs, ED visits and hospitalizations within the last year were reviewed.  Depression with anxiety Doing well on prozac. Refills provided today.  She Has ahd  appointments with Dr. [name] and feels it is very helpful.  Prior note:  Has been on prozac for a little over a year after CVA. She reports she feels her depression and anxiety are worsening. She has had many life changes. Moved from [place] last year to be close to family. Currently living with her daughter and enjoying being close to her family. Having trouble adjusting after retiring, moving and TBI last year. She denies SI or HI. Tried in the past: celexa and effexor.   Essential hypertension/HLD/morbid obesity: Pt reports compliance with amlodipine 10 mg QD. Blood pressures ranges at home are not routinely checked. Patient denies chest pain, shortness of breath, dizziness or lower extremity edema.  . Pt takes a  daily baby ASA. Pt is  prescribed statin. H/O TIA.  BMP: 01/16/2018 mildly elevated calcium at 10.6, normal GFR CBC: 06/07/2017 within normal limits Lipdis: 06/07/2017 HDL 85, LDL 75, triglycerides 102, total cholesterol 180 TSH: 01/16/2018 3.04 Diet: Low sodium Exercise: not as routinely, but trying to get back into it.  RF: HTN, HLD, Obesity, h/o CVA, FHX MI  Gait imbalance: Tripping and feeling like leaning since TBI.  PPI/osteoporosis:    Depression screen [facility] 2/9 08/28/2018 02/17/2018 01/16/2018  Decreased Interest 0 0 2  Down, Depressed, Hopeless 0 0 2  PHQ - 2 Score 0 0 4  Altered sleeping 0 0 2  Tired, decreased energy 0 0 -  Change in appetite 0 0 1  Feeling bad or failure about yourself  0 0 2  Trouble concentrating 2 0 2  Moving slowly or fidgety/restless 0 0 2  Suicidal thoughts 0 0 0  PHQ-9 Score 2 0 13  Difficult doing work/chores Somewhat difficult - -   GAD 7 : Generalized Anxiety Score 08/28/2018 01/16/2018  Nervous, Anxious, on Edge 2 1  Control/stop worrying 2 2  Worry too much - different things 2 2  Trouble relaxing 0 2  Restless 0 2  Easily annoyed or irritable 2 2  Afraid - awful might happen 0 2  Total GAD 7 Score 8 13  Anxiety Difficulty Somewhat difficult -       Fall Risk  01/16/2018  Falls in the past year? Yes  Comment Jan 2016  Number falls in past yr: 1  Injury with Fall? Yes  Risk for fall due to : History of fall(s)  Follow up Follow up appointment      There is no immunization history on file for this patient.  No exam data present  Past Medical History:  Diagnosis Date  . 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 Chicken pox   . Constipation   . Depression   . Frequent headaches   .  GERD (gastroesophageal reflux disease)   . 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 Hyperlipidemia   . Hypertension   . MS (multiple sclerosis) ([place]) 12/15/2016   possible dx. many MRI w/ positive white matter abnormality. MRI reported stable since 2015. mild progressive  Cognitive loss  . Obstructive sleep apnea    on prior neuro notes 10/2016; no results.   . Osteoporosis    had been on reclast  . Post concussion syndrome 10/2013   ongoing issues with concentration, memory loss, lability; EEG completed and normal.   . TBI (traumatic brain injury) ([place]) 2014   fall at [facility], hit head off of floor. No bleed, "concussion". Seen neurology since for  focus and word finding.   [facility] Kitchen TIA (transient ischemic attack) 11/30/2016   MRI normal. No lateralization. Speech affected and weakness during a hypotensive episdoe.   . Urinary incontinence    Allergies  Allergen Reactions  . Bee Venom Anaphylaxis  . Amoxicillin Dermatitis    Yeast infection   Past Surgical History:  Procedure Laterality Date  . BACK SURGERY  02/2015   herniated disc L4/5/6  . BREAST BIOPSY  1988   biopsy  . LAPAROSCOPIC HYSTERECTOMY     partial   . SHOULDER ARTHROSCOPY W/ LABRAL REPAIR Left 02/13/2016   left shoulder  . Study: cognitive eval  06/23/2016   mild cog-linguistic impairment, difficulty with verbal fluency  . Study: EEG   04/12/2015   Studies: EEG completed and normal.   . Study: MRI  04/28/2016   Impression: numerous non-enhancing flair hyperintense lesions w/in the brain .Suspicious for demyelinating disease such as lyme or MS. Her Lyme test were negative.    Family History  Problem Relation Age of Onset  . Lung cancer Mother   . Alcohol abuse Father   . Throat cancer Father   . Lung cancer Father   . Depression Father   . Heart attack Father   . Breast cancer Sister   . Alcohol abuse Brother   . Diabetes Brother   . Mental illness Sister   . Stroke Daughter    Social History   Socioeconomic History  . Marital status: Single    Spouse name: Not on file  . Number of children: Not on file  . Years of education: Not on file  . Highest education level: Not on file  Occupational History  . Occupation: retired  Scientific laboratory technician  . Financial resource strain: Not on file  . Food insecurity:    Worry: Not on file    Inability: Not on file  . Transportation needs:    Medical: Not on file    Non-medical: Not on file  Tobacco Use  . Smoking status: Former Smoker    Packs/day: 1.00    Years: 19.00    Pack years: 19.00    Last attempt to quit: 2012    Years since quitting: 7.8  . Smokeless tobacco: Never Used  . Tobacco comment: quit 6  years ago  Substance and Sexual Activity  . Alcohol use: Yes    Alcohol/week: 1.0 standard drinks    Types: 1 Glasses of wine per week    Comment: twice a month  . Drug use: Never  . Sexual activity: Not Currently    Partners: Male  Lifestyle  . Physical activity:    Days per week: Not on file    Minutes per session: Not on file  . Stress: Not on file  Relationships  .  Social connections:    Talks on phone: Not on file    Gets together: Not on file    Attends religious service: Not on file    Active member of club or organization: Not on file    Attends meetings of clubs or organizations: Not on file    Relationship status: Not on file  . Intimate partner violence:    Fear of current or ex partner: Not on file    Emotionally abused: Not on file    Physically abused: Not on file    Forced sexual activity: Not on file  Other Topics Concern  . Not on file  Social History Narrative   Single. Lived in [place] area, moved to [place] to be close to her daughters 2018. Lives with her daughter.   Some college, worked as Programmer, systems. Retired.    Social drinker. Former smoker.   Exercises routinely.    Wears dentures.    Drinks caffeine, takes a daily vitamin.   Smoke alarm in the home. Wears her seatbelt.    Feels safe in her relationships.    Allergies as of 08/28/2018      Reactions   Bee Venom Anaphylaxis   Amoxicillin Dermatitis   Yeast infection      Medication List        Accurate as of 08/28/18 10:32 AM. Always use your most recent med list.          amLODipine 10 MG tablet Commonly known as:  NORVASC Take 1 tablet (10 mg total) by mouth daily.   aspirin EC 81 MG tablet Take 81 mg by mouth daily.   atorvastatin 40 MG tablet Commonly known as:  LIPITOR Take 1 tablet (40 mg total) by mouth daily.   EPINEPHrine 0.3 mg/0.3 mL Soaj injection Commonly known as:  EPI-PEN Inject 0.3 mg into the skin as needed.   esomeprazole 20 MG capsule Commonly known as:   NEXIUM Take 1 capsule (20 mg total) by mouth daily at 12 noon.   FLUoxetine 40 MG capsule Commonly known as:  PROZAC Take 1 capsule (40 mg total) by mouth daily.   levothyroxine 25 MCG tablet Commonly known as:  SYNTHROID, LEVOTHROID Take 0.5 tablets (12.5 mcg total) by mouth daily before breakfast.   Magnesium 500 MG Tabs Take by mouth.   TURMERIC PO Take by mouth.       All past medical history, surgical history, allergies, family history, immunizations andmedications were updated in the EMR today and reviewed under the history and medication portions of their EMR.     No results found for this or any previous visit (from the past 2160 hour(s)).  Patient was never admitted.   ROS: 14 pt review of systems performed and negative (unless mentioned in an HPI)  Objective: BP 112/64 (BP Location: Right Arm, Patient Position: Sitting, Cuff Size: Large)   Pulse 65   Temp 97.9 F (36.6 C)   Resp 18   Ht 5' 1" (1.549 m)   Wt 167 lb 4 oz (75.9 kg)   LMP 10/18/1990   SpO2 96%   BMI 31.60 kg/m  Gen: Afebrile. No acute distress. Nontoxic.  HENT: AT. [place].  MMM.  Eyes:Pupils Equal Round Reactive to light, Extraocular movements intact,  Conjunctiva without redness, discharge or icterus. Neck/lymp/endocrine: Supple,no lymphadenopathy, no thyromegaly CV: RRR no murmur, no edema, +2/4 P posterior tibialis pulses Chest: CTAB, no wheeze or crackles Abd: Soft. NTND. BS present. no Masses palpated.  Skin: no rashes,  purpura or petechiae.  Neuro: Normal gait today. PERLA. EOMi. Alert. Oriented.  Psych: Normal affect, dress and demeanor. Normal speech. Normal thought content and judgment.  Assessment/plan: Abigail Michael is a 67 y.o. female present for establishment.  Depression with anxiety -Doing well on prozac 40 mg Qd. Loves therapy with Dr. Mamie Levers.  - Tried in the past: celexa and effexor.  -Follow-up in 6 months long as doing well, sooner if needed  Essential  hypertension/morbid obesity/HLD - BP looks great. Refills on norvasc - Comp Met (CMET) - CBC w/Diff - Lipid panel - continue statin and ASA F/u 6 mos  H/O TBI, TIA (no MRI findings) and MS/white matter disease:  - Neurology referral placed  to [facility]--> now established with [place]. MRI results uploaded to media. Seems to be a consistent white matter disease finding on multiple MRI- not formerly diagnosed with MS but last neuro notes reflect it is a definite possibility.  - Continue ASA 81 and statin.  - PT referral for gait.  Immunization due - Flu vaccine HIGH DOSE PF (Fluzone High dose) - Pneumococcal polysaccharide vaccine 23-valent greater than or equal to 2yo subcutaneous/IM  Hypercalcemia - Comp Met (CMET)  Elevated glucose - HgB A1c  Traumatic brain injury with loss of consciousness, sequela (HCC)/White matter disease/Gait instability/TIA - attending speech and feels it is helping Also having difficulty with tripping and leaning with gait.  - fall risk - Ambulatory referral to Physical Therapy  Encounter for monitoring long-term proton pump inhibitor therapy - added pepcid, since zantac recalled. Continue nexium.  - Vitamin D (25 hydroxy) - B12 - Magnesium  Osteoporosis, unspecified osteoporosis type, unspecified pathological fracture presence - Vitamin D (25 hydroxy)    No follow-ups on file.  Note is dictated utilizing voice recognition software. Although note has been proof read prior to signing, occasional typographical errors still can be missed. If any questions arise, please do not hesitate to call for verification.  Electronically signed by: Howard Pouch, DO [place]

## 2018-08-28 NOTE — Patient Instructions (Signed)
Refills on meds. Ill place a referral to PT for you, they will call to set up.   We will call you with lab results once available.   Blood pressure looks great.   You got your flu shot and your pneumonia shot today.   Please schedule your medicare wellness on your way out if you have not had one in the last year.    Please help [place] help you:  We are honored you have chosen [facility] for your Primary Care home. Below you will find basic instructions that you may need to access in the future. Please help [place] help you by reading the instructions, which cover many of the frequent questions we experience.   Prescription refills and request:  -In order to allow more efficient response time, please call your pharmacy for all refills. They will forward the request electronically to [place]. This allows for the quickest possible response. Request left on a nurse line can take longer to refill, since these are checked as time allows between office patients and other phone calls.  - refill request can take up to 3-5 working days to complete.  - If request is sent electronically and request is appropiate, it is usually completed in 1-2 business days.  - all patients will need to be seen routinely for all chronic medical conditions requiring prescription medications (see follow-up below). If you are overdue for follow up on your condition, you will be asked to make an appointment and we will call in enough medication to cover you until your appointment (up to 30 days).  - all controlled substances will require a face to face visit to request/refill.  - if you desire your prescriptions to go through a new pharmacy, and have an active script at original pharmacy, you will need to call your pharmacy and have scripts transferred to new pharmacy. This is completed between the pharmacy locations and not by your provider.    Results: If any images or labs were ordered, it can take up to 1 week to get results  depending on the test ordered and the lab/facility running and resulting the test. - Normal or stable results, which do not need further discussion, may be released to your mychart immediately with attached note to you. A call may not be generated for normal results. Please make certain to sign up for mychart. If you have questions on how to activate your mychart you can call the front office.  - If your results need further discussion, our office will attempt to contact you via phone, and if unable to reach you after 2 attempts, we will release your abnormal result to your mychart with instructions.  - All results will be automatically released in mychart after 1 week.  - Your provider will provide you with explanation and instruction on all relevant material in your results. Please keep in mind, results and labs may appear confusing or abnormal to the untrained eye, but it does not mean they are actually abnormal for you personally. If you have any questions about your results that are not covered, or you desire more detailed explanation than what was provided, you should make an appointment with your provider to do so.   Our office handles many outgoing and incoming calls daily. If we have not contacted you within 1 week about your results, please check your mychart to see if there is a message first and if not, then contact our office.  In helping with  this matter, you help decrease call volume, and therefore allow [place] to be able to respond to patients needs more efficiently.   Acute office visits (sick visit):  An acute visit is intended for a new problem and are scheduled in shorter time slots to allow schedule openings for patients with new problems. This is the appropriate visit to discuss a new problem. Problems will not be addressed by phone call or Echart message. Appointment is needed if requesting treatment. In order to provide you with excellent quality medical care with proper time for you to  explain your problem, have an exam and receive treatment with instructions, these appointments should be limited to one new problem per visit. If you experience a new problem, in which you desire to be addressed, please make an acute office visit, we save openings on the schedule to accommodate you. Please do not save your new problem for any other type of visit, let us take care of it properly and quickly for you.   Follow up visits:  Depending on your condition(s) your provider will need to see you routinely in order to provide you with quality care and prescribe medication(s). Most chronic conditions (Example: hypertension, Diabetes, depression/anxiety... etc), require visits a couple times a year. Your provider will instruct you on proper follow up for your personal medical conditions and history. Please make certain to make follow up appointments for your condition as instructed. Failing to do so could result in lapse in your medication treatment/refills. If you request a refill, and are overdue to be seen on a condition, we will always provide you with a 30 day script (once) to allow you time to schedule.    Medicare wellness (well visit): - we have a wonderful Nurse Maudie Mercury), that will meet with you and provide you will yearly medicare wellness visits. These visits should occur yearly (can not be scheduled less than 1 calendar year apart) and cover preventive health, immunizations, advance directives and screenings you are entitled to yearly through your medicare benefits. Do not miss out on your entitled benefits, this is when medicare will pay for these benefits to be ordered for you.  These are strongly encouraged by your provider and is the appropriate type of visit to make certain you are up to date with all preventive health benefits. If you have not had your medicare wellness exam in the last 12 months, please make certain to schedule one by calling the office and schedule your medicare wellness  with Maudie Mercury as soon as possible.   Yearly physical (well visit):  - Adults are recommended to be seen yearly for physicals. Check with your insurance and date of your last physical, most insurances require one calendar year between physicals. Physicals include all preventive health topics, screenings, medical exam and labs that are appropriate for gender/age and history. You may have fasting labs needed at this visit. This is a well visit (not a sick visit), new problems should not be covered during this visit (see acute visit).  - Pediatric patients are seen more frequently when they are younger. Your provider will advise you on well child visit timing that is appropriate for your their age. - This is not a medicare wellness visit. Medicare wellness exams do not have an exam portion to the visit. Some medicare companies allow for a physical, some do not allow a yearly physical. If your medicare allows a yearly physical you can schedule the medicare wellness with our nurse Maudie Mercury and have your  physical with your provider after, on the same day. Please check with insurance for your full benefits.   Late Policy/No Shows:  - all new patients should arrive 15-30 minutes earlier than appointment to allow [place] time  to  obtain all personal demographics,  insurance information and for you to complete office paperwork. - All established patients should arrive 10-15 minutes earlier than appointment time to update all information and be checked in .  - In our best efforts to run on time, if you are late for your appointment you will be asked to either reschedule or if able, we will work you back into the schedule. There will be a wait time to work you back in the schedule,  depending on availability.  - If you are unable to make it to your appointment as scheduled, please call 24 hours ahead of time to allow [place] to fill the time slot with someone else who needs to be seen. If you do not cancel your appointment ahead of  time, you may be charged a no show fee.'

## 2018-08-29 ENCOUNTER — Other Ambulatory Visit: Payer: Self-pay | Admitting: *Deleted

## 2018-08-29 ENCOUNTER — Telehealth: Payer: Self-pay | Admitting: Family Medicine

## 2018-08-29 ENCOUNTER — Ambulatory Visit: Payer: Medicare Other | Admitting: Speech Pathology

## 2018-08-29 DIAGNOSIS — R41841 Cognitive communication deficit: Secondary | ICD-10-CM

## 2018-08-29 DIAGNOSIS — R2689 Other abnormalities of gait and mobility: Secondary | ICD-10-CM | POA: Diagnosis not present

## 2018-08-29 DIAGNOSIS — R2681 Unsteadiness on feet: Secondary | ICD-10-CM | POA: Diagnosis not present

## 2018-08-29 MED ORDER — FLUOXETINE HCL 40 MG PO CAPS: 40.0000 mg | ORAL_CAPSULE | Freq: Every day | ORAL | 1 refills | Status: DC

## 2018-08-29 NOTE — Telephone Encounter (Signed)
Spoke with patient reviewed lab results and instructions. Patient verbalized understanding. Patient is not taking any calcium supplements.

## 2018-08-29 NOTE — Patient Instructions (Signed)
  Have a check list for you to do before bed   Get meds out and down stairs before you set out an alarm   Organize meds once a week on Sunday  Pay bills 1 day every other week  Go to [employer] and find a calendar you can keep with you but is big enough for you to write details - one calendar  Consider trying a guided meditation on Youtube   Reading focus cards - consider

## 2018-08-29 NOTE — Telephone Encounter (Signed)
Please inform patient the following information: Her calcium is mildly elevated same as last time at 10.6 (NL 10.5). Please ask her if she is taking any calcium supplement? If so decrease dose by half. I was able to review an old lab in her records that showed elevated calcium, even higher in the past (10.9), so this does not appear to be anything new for her. Make sure to maintain adequate hydration with water and we will continue to monitor routinely for her.  - 6 mos routine followup for [facility].  I also placed the PT orders for her, she should get a call to start.

## 2018-08-29 NOTE — Therapy (Signed)
[facility] [address] [place], [place], [address] Phone: [phone]   Fax:  [phone]  Speech Language Pathology Treatment  Patient Details  Name: Abigail Michael MRN: [id number] Date of Birth: December 20, 1950 Referring Provider (SLP): Dr. Sharlene Dory   Encounter Date: 08/29/2018  End of Session - 08/29/18 1513    Visit Number  2    Number of Visits  17    Date for SLP Re-Evaluation  10/20/18    Authorization Type  VL PT, OT, ST is 27 - zeor used at time of ST eval - I am recommending PT eval    Authorization - Visit Number  1    Authorization - Number of Visits  41    SLP Start Time  3419    SLP Stop Time   1449    SLP Time Calculation (min)  46 min    Activity Tolerance  Patient tolerated treatment well       Past Medical History:  Diagnosis Date  . 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 Chicken pox   . Constipation   . Depression   . Frequent headaches   . GERD (gastroesophageal reflux disease)   . 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 Hyperlipidemia   . Hypertension   . MS (multiple sclerosis) ([place]) 12/15/2016   possible dx. many MRI w/ positive white matter abnormality. MRI reported stable since 2015. mild progressive  Cognitive loss  . Obstructive sleep apnea    on prior neuro notes 10/2016; no results.   . Osteoporosis    had been on reclast  . Post concussion syndrome 10/2013   ongoing issues with concentration, memory loss, lability; EEG completed and normal.   . TBI (traumatic brain injury) ([place]) 2014   fall at [facility], hit head off of floor. No bleed, "concussion". Seen neurology since for focus and word finding.   Marland Kitchen TIA (transient ischemic attack) 11/30/2016   MRI normal. No lateralization. Speech affected and weakness during a hypotensive episdoe.   . Urinary incontinence     Past Surgical History:  Procedure Laterality Date  . BACK SURGERY   02/2015   herniated disc L4/5/6  . BREAST BIOPSY  1988   biopsy  . LAPAROSCOPIC HYSTERECTOMY     partial   . SHOULDER ARTHROSCOPY W/ LABRAL REPAIR Left 02/13/2016   left shoulder  . Study: cognitive eval  06/23/2016   mild cog-linguistic impairment, difficulty with verbal fluency  . Study: EEG   04/12/2015   Studies: EEG completed and normal.   . Study: MRI  04/28/2016   Impression: numerous non-enhancing flair hyperintense lesions w/in the brain .Suspicious for demyelinating disease such as lyme or MS. Her Lyme test were negative.     There were no vitals filed for this visit.  Subjective Assessment - 08/29/18 1502    Subjective  "I have to go back upstairs to get my meds usually b/c I forget"    Patient is accompained by:  Family member   daughter   Currently in Pain?  No/denies            ADULT SLP TREATMENT - 08/29/18 1502      General Information   Behavior/Cognition  Alert;Cooperative;Pleasant mood      Treatment Provided   Treatment provided  Cognitive-Linquistic      Cognitive-Linquistic Treatment   Treatment focused on  Cognition;Aphasia    Skilled Treatment  Pt  continues to report difficulty finding words - [place] - 2 administered 55/60 WNL. Will continue goal for word finding strategies. Generated compensatory strategy with pt and her daughter to manage am meds, with external aids, use of med organizer and having meds out in sight. Pt also educated to organized pill box 1x a week only, as she was filling days radomly when "it gets low." Targeted strategies for calendar management, consolidating her 3 calendars to 1 day timer/agenda, as pt was mixing up appoinments trying to keep up with 3 calendars. Pt remains tearful intermittently. Reading remains slow - introduced Reading Focus card as attention compensation while reading, pt reports this does improve attention to each line and ease of attending to reading.       Assessment / Recommendations / Plan    Plan  Continue with current plan of care      Progression Toward Goals   Progression toward goals  Progressing toward goals       SLP Education - 08/29/18 1507    Education Details  compensations for calendar and med management; compensations for attention while reading     Person(s) Educated  Patient;Child(ren)   daughter, Katharine Look   Methods  Explanation;Demonstration;Verbal cues;Handout    Comprehension  Verbalized understanding;Returned demonstration;Need further instruction       SLP Short Term Goals - 08/29/18 1512      SLP SHORT TERM GOAL #1   Title  Pt will utilize portable external memory/organization aids for high levl ADL's such as shopping and bill paying with rare min A over 2 sessions.    Time  4    Period  Weeks    Status  On-going      SLP SHORT TERM GOAL #2   Title  Pt and family will carryover compensations for higher level executive function tasks with occasional min A over 2 sessions (pre-plan trips, shopping, errands)     Time  4    Period  Weeks    Status  On-going      SLP SHORT TERM GOAL #3   Title  Formal naming assessment to be completed as indicated    Time  2    Period  Weeks    Status  On-going      SLP SHORT TERM GOAL #4   Title  Pt will utilize compensations to process/recall what she has read with rare min A over 2 sessions.    Time  4    Period  Weeks    Status  On-going       SLP Long Term Goals - 08/29/18 1513      SLP LONG TERM GOAL #1   Title  Pt will use external aids to plan her day and complete high level ADL's with supervision cues over 3 sessions    Time  8    Period  Weeks    Status  On-going      SLP LONG TERM GOAL #2   Title  Pt will run 2 errands in a row using compensations with occasional min A over 2 sessions.     Status  On-going      SLP LONG TERM GOAL #3   Title  Pt will complete high level executive function tasks with use of compensations, 85% accuracy and rare min A over 2 sessions    Time  8    Period   Weeks    Status  On-going      SLP LONG TERM  GOAL #4   Title  Pt will retrieve 3 items from a store in less than 20 minutes with rare min A over 2 sessions.    Time  8    Period  Weeks    Status  On-going       Plan - 08/29/18 1508    Clinical Impression Statement  Initiated compensatory strategies for calendar and medication management and attention/processing with reading. Pt remains tearful and expresses repetitive thoughts and worries about remembering her appointments and managing calendars. Continue skilled ST to maximize carryover of compensations for ongoing cognitive impairments for improve independnece and QOL    Speech Therapy Frequency  2x / week    Duration  --   8 weeks or 17 visits   Treatment/Interventions  SLP instruction and feedback;Cueing hierarchy;Environmental controls;Language facilitation;Compensatory strategies;Functional tasks;Cognitive reorganization;Compensatory techniques;Internal/external aids;Multimodal communcation approach;Patient/family education    Potential to Achieve Goals  Good    Consulted and Agree with Plan of Care  Patient;Family member/caregiver    Family Member Consulted  daugher, Katharine Look       Patient will benefit from skilled therapeutic intervention in order to improve the following deficits and impairments:   Cognitive communication deficit    Problem List Patient Active Problem List   Diagnosis Date Noted  . Encounter for monitoring long-term proton pump inhibitor therapy 08/28/2018  . White matter disease 02/20/2018  . Osteoporosis   . Hypercalcemia 01/17/2018  . Essential hypertension 01/16/2018  . Hypothyroidism 01/16/2018  . Depression with anxiety 01/16/2018  . Traumatic brain injury with loss of consciousness ([place]) 01/16/2018  . Hyperlipidemia     , Annye Rusk MS, CCC-SLP 08/29/2018, 3:14 PM  [facility] [address] [place], [place], [address] Phone:  [phone]   Fax:  [phone]   Name: Marvelous Woolford MRN: [id number] Date of Birth: Feb 06, 1951

## 2018-08-31 ENCOUNTER — Encounter: Payer: Self-pay | Admitting: Speech Pathology

## 2018-08-31 ENCOUNTER — Ambulatory Visit: Payer: Medicare Other | Admitting: Speech Pathology

## 2018-08-31 DIAGNOSIS — R2689 Other abnormalities of gait and mobility: Secondary | ICD-10-CM | POA: Diagnosis not present

## 2018-08-31 DIAGNOSIS — R2681 Unsteadiness on feet: Secondary | ICD-10-CM | POA: Diagnosis not present

## 2018-08-31 DIAGNOSIS — R41841 Cognitive communication deficit: Secondary | ICD-10-CM

## 2018-08-31 NOTE — Patient Instructions (Signed)
  It's OK to admit that you are not the same - you have changed  It may be hard for family to understand your brain injury because you look so good on the outside. You have no outside symptoms - your speech sounds good  Sabas Sous - therapist - I don't know if she takes Garment/textile technologist, breathing practice for stress control  Great job with the calendar  Come up with a script of how to describe what your brain is experiencing to educate your family member   Try some brain activities on the list I gave you - daily would be good if you are able  Word finding games - do not use the timer! If you are alone, just try to write down the words you can think of - you don't have to play by the rules of 2-4 players  [place] - 5 below [place]  Consider a jig saw puzzle to work on intermittently  Write the alphabet, then name a category and try to write down a word for each letter keeping in the category (Chirstmas, colors, fruits, actors, celebrities, TV shows, school supplies, cities etc - anything will do)

## 2018-08-31 NOTE — Therapy (Signed)
[facility] [address] [place], [place], [address] Phone: [phone]   Fax:  [phone]  Speech Language Pathology Treatment  Patient Details  Name: Abigail Michael MRN: [id number] Date of Birth: 1950-11-05 Referring Provider (SLP): Dr. Sharlene Dory   Encounter Date: 08/31/2018  End of Session - 08/31/18 1515    Visit Number  3    Number of Visits  17    Date for SLP Re-Evaluation  10/20/18    Authorization Type  VL PT, OT, ST is 75 - zeor used at time of ST eval - I am recommending PT eval    Authorization - Visit Number  2    Authorization - Number of Visits  33    SLP Start Time  9030    SLP Stop Time   1400    SLP Time Calculation (min)  43 min    Activity Tolerance  Patient tolerated treatment well       Past Medical History:  Diagnosis Date  . 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 Chicken pox   . Constipation   . Depression   . Frequent headaches   . GERD (gastroesophageal reflux disease)   . 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 Hyperlipidemia   . Hypertension   . MS (multiple sclerosis) ([place]) 12/15/2016   possible dx. many MRI w/ positive white matter abnormality. MRI reported stable since 2015. mild progressive  Cognitive loss  . Obstructive sleep apnea    on prior neuro notes 10/2016; no results.   . Osteoporosis    had been on reclast  . Post concussion syndrome 10/2013   ongoing issues with concentration, memory loss, lability; EEG completed and normal.   . TBI (traumatic brain injury) ([place]) 2014   fall at [facility], hit head off of floor. No bleed, "concussion". Seen neurology since for focus and word finding.   Marland Kitchen TIA (transient ischemic attack) 11/30/2016   MRI normal. No lateralization. Speech affected and weakness during a hypotensive episdoe.   . Urinary incontinence     Past Surgical History:  Procedure Laterality Date  . BACK SURGERY   02/2015   herniated disc L4/5/6  . BREAST BIOPSY  1988   biopsy  . LAPAROSCOPIC HYSTERECTOMY     partial   . SHOULDER ARTHROSCOPY W/ LABRAL REPAIR Left 02/13/2016   left shoulder  . Study: cognitive eval  06/23/2016   mild cog-linguistic impairment, difficulty with verbal fluency  . Study: EEG   04/12/2015   Studies: EEG completed and normal.   . Study: MRI  04/28/2016   Impression: numerous non-enhancing flair hyperintense lesions w/in the brain .Suspicious for demyelinating disease such as lyme or MS. Her Lyme test were negative.     There were no vitals filed for this visit.  Subjective Assessment - 08/31/18 1507    Subjective  "We got all of my meds in the organizer and I wrote in my calendar on Sundays to organize pills"    Currently in Pain?  No/denies            ADULT SLP TREATMENT - 08/31/18 1508      General Information   Behavior/Cognition  Alert;Cooperative;Pleasant mood      Treatment Provided   Treatment provided  Cognitive-Linquistic      Pain Assessment   Pain Assessment  No/denies pain      Cognitive-Linquistic Treatment   Treatment focused on  Cognition    Skilled Treatment  Pt showed me her new agenda/day planner with appointments filled in and places to write down details. She showed a picture of her med Environmental education officer on the table downstairs.  Pt verbalized strategies to use these throughout the day with min questioning cues. Trained pt on increasing time she attends to cognitive activities at home.  Pt wrote 2 errands in her planner, completed 1.       Assessment / Recommendations / Plan   Plan  Continue with current plan of care      Progression Toward Goals   Progression toward goals  Progressing toward goals       SLP Education - 08/31/18 1513    Education Details  language activities to do at home; compensations for cognition    Person(s) Educated  Patient    Methods  Explanation;Demonstration;Verbal cues;Handout    Comprehension  Verbalized  understanding;Returned demonstration;Need further instruction       SLP Short Term Goals - 08/31/18 1515      SLP SHORT TERM GOAL #1   Title  Pt will utilize portable external memory/organization aids for high levl ADL's such as shopping and bill paying with rare min A over 2 sessions.    Time  4    Period  Weeks    Status  On-going      SLP SHORT TERM GOAL #2   Title  Pt and family will carryover compensations for higher level executive function tasks with occasional min A over 2 sessions (pre-plan trips, shopping, errands)     Time  4    Period  Weeks    Status  On-going      SLP SHORT TERM GOAL #3   Title  Formal naming assessment to be completed as indicated    Time  2    Period  Weeks    Status  On-going      SLP SHORT TERM GOAL #4   Title  Pt will utilize compensations to process/recall what she has read with rare min A over 2 sessions.    Time  4    Period  Weeks    Status  On-going       SLP Long Term Goals - 08/31/18 1515      SLP LONG TERM GOAL #1   Title  Pt will use external aids to plan her day and complete high level ADL's with supervision cues over 3 sessions    Time  8    Period  Weeks    Status  On-going      SLP LONG TERM GOAL #2   Title  Pt will run 2 errands in a row using compensations with occasional min A over 2 sessions.     Status  On-going      SLP LONG TERM GOAL #3   Title  Pt will complete high level executive function tasks with use of compensations, 85% accuracy and rare min A over 2 sessions    Time  8    Period  Weeks    Status  On-going      SLP LONG TERM GOAL #4   Title  Pt will retrieve 3 items from a store in less than 20 minutes with rare min A over 2 sessions.    Time  8    Period  Weeks    Status  On-going       Plan - 08/31/18 1514    Clinical Impression Statement  Initiated compensatory strategies for calendar and medication management and attention/processing with reading. Pt remains tearful and expresses repetitive  thoughts and worries about remembering her appointments and managing calendars. Continue skilled ST to maximize carryover of compensations for ongoing cognitive impairments for improve independnece and QOL    Duration  --   8 weeks or 17 visits   Treatment/Interventions  SLP instruction and feedback;Cueing hierarchy;Environmental controls;Language facilitation;Compensatory strategies;Functional tasks;Cognitive reorganization;Compensatory techniques;Internal/external aids;Multimodal communcation approach;Patient/family education    Potential to Achieve Goals  Good       Patient will benefit from skilled therapeutic intervention in order to improve the following deficits and impairments:   Cognitive communication deficit    Problem List Patient Active Problem List   Diagnosis Date Noted  . Encounter for monitoring long-term proton pump inhibitor therapy 08/28/2018  . White matter disease 02/20/2018  . Osteoporosis   . Hypercalcemia 01/17/2018  . Essential hypertension 01/16/2018  . Hypothyroidism 01/16/2018  . Depression with anxiety 01/16/2018  . Traumatic brain injury with loss of consciousness ([place]) 01/16/2018  . Hyperlipidemia     [place] First, Annye Rusk MS, CCC-SLP 08/31/2018, 3:16 PM  [facility] [address] [place], [place], [address] Phone: [phone]   Fax:  [phone]   Name: Abigail Michael MRN: [id number] Date of Birth: November 03, 1950

## 2018-09-05 ENCOUNTER — Encounter: Payer: Self-pay | Admitting: Physical Therapy

## 2018-09-05 ENCOUNTER — Other Ambulatory Visit: Payer: Self-pay

## 2018-09-05 ENCOUNTER — Ambulatory Visit: Payer: Medicare Other | Admitting: Physical Therapy

## 2018-09-05 ENCOUNTER — Ambulatory Visit: Payer: Medicare Other | Admitting: Speech Pathology

## 2018-09-05 DIAGNOSIS — R2689 Other abnormalities of gait and mobility: Secondary | ICD-10-CM

## 2018-09-05 DIAGNOSIS — R41841 Cognitive communication deficit: Secondary | ICD-10-CM | POA: Diagnosis not present

## 2018-09-05 DIAGNOSIS — R2681 Unsteadiness on feet: Secondary | ICD-10-CM

## 2018-09-05 NOTE — Patient Instructions (Signed)
  You must use a list for you errands (the 2-3 places you are going) and a list for each store/place  When you want to try to go to 2 or 3 stores, make your list. Visualize yourself in the 1st store, walking through and finding the items you need  When are back in the car, take some time to close your eyes, breathe and re-group and let your brain relax. When you are ready, picture your next store/errand in your mind.  When you are ready to try to  go to multiple stores make sure you are rested, fed and hydrated  Make a list of the aisles your regular items are on, the brand you like or the shelf they are on   Consider drawing a rough map of the aisles and where your items are

## 2018-09-05 NOTE — Therapy (Signed)
[facility] [address] [place], [place], [address] Phone: [phone]   Fax:  [phone]  Speech Language Pathology Treatment  Patient Details  Name: Abigail Michael MRN: [id number] Date of Birth: 07/29/1951 Referring Provider (SLP): Dr. Sharlene Dory   Encounter Date: 09/05/2018  End of Session - 09/05/18 1509    Visit Number  4    Number of Visits  17    Date for SLP Re-Evaluation  10/20/18    Authorization Type  VL PT, OT, ST is 75 - zeor used at time of ST eval - I am recommending PT eval - PT eval 09/05/18 - will need to add these into visit limit    Authorization - Visit Number  3    SLP Start Time  1405    SLP Stop Time   1450    SLP Time Calculation (min)  45 min    Activity Tolerance  Patient tolerated treatment well       Past Medical History:  Diagnosis Date  . 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 Chicken pox   . Constipation   . Depression   . Frequent headaches   . GERD (gastroesophageal reflux disease)   . 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 Hyperlipidemia   . Hypertension   . MS (multiple sclerosis) ([place]) 12/15/2016   possible dx. many MRI w/ positive white matter abnormality. MRI reported stable since 2015. mild progressive  Cognitive loss  . Obstructive sleep apnea    on prior neuro notes 10/2016; no results.   . Osteoporosis    had been on reclast  . Post concussion syndrome 10/2013   ongoing issues with concentration, memory loss, lability; EEG completed and normal.   . TBI (traumatic brain injury) ([place]) 2014   fall at [facility], hit head off of floor. No bleed, "concussion". Seen neurology since for focus and word finding.   Marland Kitchen TIA (transient ischemic attack) 11/30/2016   MRI normal. No lateralization. Speech affected and weakness during a hypotensive episdoe.   . Urinary incontinence     Past Surgical History:  Procedure Laterality Date  .  BACK SURGERY  02/2015   herniated disc L4/5/6  . BREAST BIOPSY  1988   biopsy  . LAPAROSCOPIC HYSTERECTOMY     partial   . SHOULDER ARTHROSCOPY W/ LABRAL REPAIR Left 02/13/2016   left shoulder  . Study: cognitive eval  06/23/2016   mild cog-linguistic impairment, difficulty with verbal fluency  . Study: EEG   04/12/2015   Studies: EEG completed and normal.   . Study: MRI  04/28/2016   Impression: numerous non-enhancing flair hyperintense lesions w/in the brain .Suspicious for demyelinating disease such as lyme or MS. Her Lyme test were negative.     There were no vitals filed for this visit.         ADULT SLP TREATMENT - 09/05/18 1413      General Information   Behavior/Cognition  Alert;Cooperative;Pleasant mood      Treatment Provided   Treatment provided  Cognitive-Linquistic      Cognitive-Linquistic Treatment   Treatment focused on  Cognition    Skilled Treatment  Pt continues to report success managing her am meds with environmental compensations. She also demonstrates success using her agenda planner making orthodontic appointments. Today, we generated compensations for attention and over stimulation in grocery store and strategies to run multiple errands, including lists organized by department,  preplanning, writing down aisles of her regular products.       Assessment / Recommendations / Plan   Plan  Continue with current plan of care      Progression Toward Goals   Progression toward goals  Progressing toward goals       SLP Education - 09/05/18 1505    Education Details  compensations for running multiple errands, locating items in grocery store    Person(s) Educated  Patient    Methods  Explanation;Demonstration;Verbal cues;Handout    Comprehension  Verbalized understanding;Returned demonstration;Verbal cues required;Need further instruction       SLP Short Term Goals - 09/05/18 1508      SLP SHORT TERM GOAL #1   Title  Pt will utilize portable  external memory/organization aids for high levl ADL's such as shopping and bill paying with rare min A over 2 sessions.    Baseline  09/05/18    Time  3    Period  Weeks    Status  On-going      SLP SHORT TERM GOAL #2   Title  Pt and family will carryover compensations for higher level executive function tasks with occasional min A over 2 sessions (pre-plan trips, shopping, errands)     Time  3    Period  Weeks    Status  On-going      SLP SHORT TERM GOAL #3   Title  Formal naming assessment to be completed as indicated    Time  2    Period  Weeks    Status  On-going      SLP SHORT TERM GOAL #4   Title  Pt will utilize compensations to process/recall what she has read with rare min A over 2 sessions.    Time  4    Period  Weeks    Status  On-going       SLP Long Term Goals - 09/05/18 1508      SLP LONG TERM GOAL #1   Title  Pt will use external aids to plan her day and complete high level ADL's with supervision cues over 3 sessions    Time  7    Period  Weeks    Status  On-going      SLP LONG TERM GOAL #2   Title  Pt will run 2 errands in a row using compensations with occasional min A over 2 sessions.     Time  7    Period  Weeks    Status  On-going      SLP LONG TERM GOAL #3   Title  Pt will complete high level executive function tasks with use of compensations, 85% accuracy and rare min A over 2 sessions    Time  7    Period  Weeks    Status  On-going      SLP LONG TERM GOAL #4   Title  Pt will retrieve 3 items from a store in less than 20 minutes with rare min A over 2 sessions.    Time  7    Period  Weeks    Status  On-going       Plan - 09/05/18 1506    Clinical Impression Statement  Pt reports improved optimism today. Targeted external compensations for grocery shopping, and running multile errands, due to cognitive impairment and brain over stimulation. Continue skilled ST to maximize independence and QOL in high level ADL's    Speech Therapy Frequency  2x / week    Treatment/Interventions  SLP instruction and feedback;Cueing hierarchy;Environmental controls;Language facilitation;Compensatory strategies;Functional tasks;Cognitive reorganization;Compensatory techniques;Internal/external aids;Multimodal communcation approach;Patient/family education    Potential to Achieve Goals  Good       Patient will benefit from skilled therapeutic intervention in order to improve the following deficits and impairments:   Cognitive communication deficit    Problem List Patient Active Problem List   Diagnosis Date Noted  . Encounter for monitoring long-term proton pump inhibitor therapy 08/28/2018  . White matter disease 02/20/2018  . Osteoporosis   . Hypercalcemia 01/17/2018  . Essential hypertension 01/16/2018  . Hypothyroidism 01/16/2018  . Depression with anxiety 01/16/2018  . Traumatic brain injury with loss of consciousness ([place]) 01/16/2018  . Hyperlipidemia     Hoyt Leanos, Annye Rusk MS, CCC-SLP 09/05/2018, 3:10 PM  [facility] [address] [place], [place], [address] Phone: [phone]   Fax:  [phone]   Name: Myonna Chisom MRN: [id number] Date of Birth: [birth year]

## 2018-09-06 NOTE — Therapy (Signed)
[facility] [address] [place], [place], [address] Phone: [phone]   Fax:  [phone]  Physical Therapy Evaluation  Patient Details  Name: Jennalee Greaves MRN: [id number] Date of Birth: 27-Nov-1950 Referring Provider (PT): Dr. Raoul Pitch   Encounter Date: 09/05/2018  PT End of Session - 09/06/18 0736    Visit Number  1    Number of Visits  9    Date for PT Re-Evaluation  10/11/18    Authorization Type  Medicare + BCBS    PT Start Time  1452    PT Stop Time  1530    PT Time Calculation (min)  38 min    Activity Tolerance  Patient tolerated treatment well    Behavior During Therapy  [facility] for tasks assessed/performed       Past Medical History:  Diagnosis Date  . 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 Chicken pox   . Constipation   . Depression   . Frequent headaches   . GERD (gastroesophageal reflux disease)   . 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 Hyperlipidemia   . Hypertension   . MS (multiple sclerosis) ([place]) 12/15/2016   possible dx. many MRI w/ positive white matter abnormality. MRI reported stable since 2015. mild progressive  Cognitive loss  . Obstructive sleep apnea    on prior neuro notes 10/2016; no results.   . Osteoporosis    had been on reclast  . Post concussion syndrome 10/2013   ongoing issues with concentration, memory loss, lability; EEG completed and normal.   . TBI (traumatic brain injury) ([place]) 2014   fall at [facility], hit head off of floor. No bleed, "concussion". Seen neurology since for focus and word finding.   Marland Kitchen TIA (transient ischemic attack) 11/30/2016   MRI normal. No lateralization. Speech affected and weakness during a hypotensive episdoe.   . Urinary incontinence     Past Surgical History:  Procedure Laterality Date  . BACK SURGERY  02/2015   herniated disc L4/5/6  . BREAST BIOPSY  1988   biopsy  . LAPAROSCOPIC HYSTERECTOMY     partial   . SHOULDER ARTHROSCOPY W/ LABRAL REPAIR Left 02/13/2016   left shoulder  . Study: cognitive eval  06/23/2016   mild cog-linguistic impairment, difficulty with verbal fluency  . Study: EEG   04/12/2015   Studies: EEG completed and normal.   . Study: MRI  04/28/2016   Impression: numerous non-enhancing flair hyperintense lesions w/in the brain .Suspicious for demyelinating disease such as lyme or MS. Her Lyme test were negative.     There were no vitals filed for this visit.   Subjective Assessment - 09/05/18 1453    Subjective  Patient reporting balance deficits, feels like she veers R and L. Feels like she is tripping over her feet. Feel the otehr day - states she missed a step. Has been feeling this way for about ~5 years. Has had PT in the past, and worked on balance. Has a history of back surgery and RTC repair. Is doing exercises before she gets out of bed. Denies N&T into legs or feet. Denies dizziness.     Pertinent History  depression, headaches, HTN, MS (possible dx), OSA, post-consussion syndrome, TBI, TIA    Patient Stated Goals  improve balance, feel more steady.    Currently in Pain?  No/denies         [facility] PT Assessment - 09/05/18  1456      Assessment   Medical Diagnosis  TBI; gait instability    Referring Provider (PT)  Dr. Raoul Pitch    Onset Date/Surgical Date  --   2015 - concussion   Prior Therapy  no      Precautions   Precautions  Fall      Restrictions   Weight Bearing Restrictions  No      Balance Screen   Has the patient fallen in the past 6 months  Yes    How many times?  1    Has the patient had a decrease in activity level because of a fear of falling?   No    Is the patient reluctant to leave their home because of a fear of falling?   No      Home Environment   Living Environment  Private residence    Living Arrangements  Children    Type of [place] Access  Stairs to enter    Entrance Stairs-Number of Steps  3    Entrance  Stairs-Rails  Right    Home Layout  Two level    Alternate Level Stairs-Number of Steps  --   flight   Alternate Level Stairs-Rails  Right    Home Equipment  None      Prior Function   Level of Independence  Independent    Leisure  walking      Sensation   Light Touch  Appears Intact      Coordination   Gross Motor Movements are Fluid and Coordinated  Yes    Finger Nose Finger Test  normal    Heel Shin Test  normal      Posture/Postural Control   Posture/Postural Control  Postural limitations    Postural Limitations  Rounded Shoulders;Forward head      ROM / Strength   AROM / PROM / Strength  AROM;Strength      AROM   Overall AROM   Within functional limits for tasks performed    Overall AROM Comments  B LE      Strength   Strength Assessment Site  Hip;Knee;Ankle    Right/Left Hip  Right;Left    Right Hip Flexion  4-/5    Left Hip Flexion  4/5    Right/Left Knee  Right;Left    Right Knee Flexion  4-/5    Right Knee Extension  4+/5    Left Knee Flexion  4-/5    Left Knee Extension  4+/5    Right/Left Ankle  Right;Left    Right Ankle Dorsiflexion  4+/5    Left Ankle Dorsiflexion  4+/5      Balance   Balance Assessed  Yes      Standardized Balance Assessment   Standardized Balance Assessment  Berg Balance Test;Five Times Sit to Stand    Five times sit to stand comments   13.19 - no UE support      Berg Balance Test   Sit to Stand  Able to stand without using hands and stabilize independently    Standing Unsupported  Able to stand safely 2 minutes    Sitting with Back Unsupported but Feet Supported on Floor or Stool  Able to sit safely and securely 2 minutes    Stand to Sit  Sits safely with minimal use of hands    Transfers  Able to transfer safely, minor use of hands    Standing Unsupported with Eyes Closed  Able to stand 10 seconds with supervision    Standing Ubsupported with Feet Together  Able to place feet together independently and stand for 1 minute with  supervision    From Standing, Reach Forward with Outstretched Arm  Can reach confidently >25 cm (10")    From Standing Position, Pick up Object from [place] to pick up shoe, needs supervision    From Standing Position, Turn to Look Behind Over each Shoulder  Looks behind from both sides and weight shifts well    Turn 360 Degrees  Able to turn 360 degrees safely but slowly    Standing Unsupported, Alternately Place Feet on Step/Stool  Able to stand independently and complete 8 steps >20 seconds    Standing Unsupported, One Foot in [place] to take small step independently and hold 30 seconds    Standing on One Leg  Able to lift leg independently and hold equal to or more than 3 seconds    Total Score  46    Berg comment:  46/56 - moderate fall risk      Functional Gait  Assessment   Gait assessed   Yes    Gait Level Surface  Walks 20 ft in less than 7 sec but greater than 5.5 sec, uses assistive device, slower speed, mild gait deviations, or deviates 6-10 in outside of the 12 in walkway width.    Change in Gait Speed  Able to change speed, demonstrates mild gait deviations, deviates 6-10 in outside of the 12 in walkway width, or no gait deviations, unable to achieve a major change in velocity, or uses a change in velocity, or uses an assistive device.    Gait with Horizontal Head Turns  Performs head turns smoothly with slight change in gait velocity (eg, minor disruption to smooth gait path), deviates 6-10 in outside 12 in walkway width, or uses an assistive device.    Gait with Vertical Head Turns  Performs task with slight change in gait velocity (eg, minor disruption to smooth gait path), deviates 6 - 10 in outside 12 in walkway width or uses assistive device    Gait and Pivot Turn  Turns slowly, requires verbal cueing, or requires several small steps to catch balance following turn and stop    Step Over Obstacle  Is able to step over one shoe box (4.5 in total height) but must slow down  and adjust steps to clear box safely. May require verbal cueing.    Gait with Narrow Base of Support  Ambulates 7-9 steps.    Gait with Eyes Closed  Walks 20 ft, uses assistive device, slower speed, mild gait deviations, deviates 6-10 in outside 12 in walkway width. Ambulates 20 ft in less than 9 sec but greater than 7 sec.    Ambulating Backwards  Walks 20 ft, uses assistive device, slower speed, mild gait deviations, deviates 6-10 in outside 12 in walkway width.    Steps  Alternating feet, must use rail.    Total Score  18    FGA comment:  18/30 - high fall risk                 Objective measurements completed on examination: See above findings.              PT Education - 09/06/18 0735    Education Details  exam findings, POC, goal establishment     Person(s) Educated  Patient    Methods  Explanation  Comprehension  Verbalized understanding          PT Long Term Goals - 09/06/18 0739      PT LONG TERM GOAL #1   Title  patient to be independent with advanced HEP for balance    Time  4    Period  Weeks    Status  New    Target Date  10/11/18      PT LONG TERM GOAL #2   Title  patient to improve Berg by >/= 4 points demonstrating reduced fall risk    Time  4    Period  Weeks    Status  New    Target Date  10/11/18      PT LONG TERM GOAL #3   Title  patient to improve FGA by >/= 6 points demonstrating improved functional mobility and reduced fall risk    Time  4    Period  Weeks    Status  New    Target Date  10/11/18      PT LONG TERM GOAL #4   Title  patient to demonstrate Floor <> stand transfers with Mod I without need for UE support on chair/object    Time  4    Period  Weeks    Status  New    Target Date  10/11/18      PT LONG TERM GOAL #5   Title  patient to demosntrate gait over various even and uneven surfaces as well as up/down ramps and inclines for >/= 500 feet with Ind with no LOB    Time  4    Period  Weeks    Status  New     Target Date  10/11/18      Additional Long Term Goals   Additional Long Term Goals  Yes      PT LONG TERM GOAL #6   Title  patient to demonstrate participation in gym/walking program most days of the week    Time  4    Period  Weeks    Status  New    Target Date  10/11/18             Plan - 09/06/18 0731    Clinical Impression Statement  Ms. Lerette is a very pleasant 67 y/o female presenting to [facility] today regarding primary concerns of reduced balance and fear of falling. Patient today scoring 46/56 on Berg demonstrating moderate fall risk, and 18/30 on FGA demonstrating high fall risk. Noted sway with ambulating in straight line while engaging in conversation with patient running into object (wall) on R side. Patient to beenfit from skilled PT intervention to address balance deficits to reduce risk for falls and improve safety with functional mobility.     History and Personal Factors relevant to plan of care:  depression, headaches, HTN, MS (possible dx), OSA, post-consussion syndrome, TBI, TIA    Clinical Presentation  Evolving    Clinical Presentation due to:  depression, headaches, HTN, MS (possible dx), OSA, post-consussion syndrome, TBI, TIA, currently living with family, daughter assisting with driving    Clinical Decision Making  Moderate    Rehab Potential  Good    PT Frequency  2x / week    PT Duration  4 weeks    PT Treatment/Interventions  ADLs/Self Care Home Management;Gait training;Stair training;Functional mobility training;Therapeutic activities;Therapeutic exercise;Balance training;Patient/family education;Neuromuscular re-education;Manual techniques;Taping;Visual/perceptual remediation/compensation    PT Next Visit Plan  initiate HEP for balance    Consulted and Agree  with Plan of Care  Patient       Patient will benefit from skilled therapeutic intervention in order to improve the following deficits and impairments:  Decreased balance, Decreased activity tolerance,  Decreased mobility, Decreased strength  Visit Diagnosis: Unsteadiness on feet  Other abnormalities of gait and mobility     Problem List Patient Active Problem List   Diagnosis Date Noted  . Encounter for monitoring long-term proton pump inhibitor therapy 08/28/2018  . White matter disease 02/20/2018  . Osteoporosis   . Hypercalcemia 01/17/2018  . Essential hypertension 01/16/2018  . Hypothyroidism 01/16/2018  . Depression with anxiety 01/16/2018  . Traumatic brain injury with loss of consciousness ([place]) 01/16/2018  . Hyperlipidemia      Lanney Gins, PT, DPT Supplemental Physical Therapist 09/06/18 7:45 AM Pager: [phone] Office: [facility] [address] [place], [place], [address] Phone: [phone]   Fax:  [phone]  Name: Shaquna Geigle MRN: [id number] Date of Birth: 03-Jan-1951

## 2018-09-07 ENCOUNTER — Ambulatory Visit: Payer: Medicare Other | Admitting: Speech Pathology

## 2018-09-07 ENCOUNTER — Encounter: Payer: Self-pay | Admitting: Speech Pathology

## 2018-09-07 DIAGNOSIS — R2681 Unsteadiness on feet: Secondary | ICD-10-CM | POA: Diagnosis not present

## 2018-09-07 DIAGNOSIS — R2689 Other abnormalities of gait and mobility: Secondary | ICD-10-CM | POA: Diagnosis not present

## 2018-09-07 DIAGNOSIS — R41841 Cognitive communication deficit: Secondary | ICD-10-CM | POA: Diagnosis not present

## 2018-09-07 NOTE — Therapy (Signed)
[facility] [address] [place], [place], [address] Phone: [phone]   Fax:  [phone]  Speech Language Pathology Treatment  Patient Details  Name: Abigail Michael MRN: [id number] Date of Birth: 1951-02-08 Referring Provider (SLP): Dr. Sharlene Dory   Encounter Date: 09/07/2018  End of Session - 09/07/18 1532    Visit Number  5    Number of Visits  17    Date for SLP Re-Evaluation  10/20/18    Authorization - Visit Number  4    SLP Start Time  0109    SLP Stop Time   1400    SLP Time Calculation (min)  42 min    Activity Tolerance  Patient tolerated treatment well       Past Medical History:  Diagnosis Date  . 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 Chicken pox   . Constipation   . Depression   . Frequent headaches   . GERD (gastroesophageal reflux disease)   . 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 Hyperlipidemia   . Hypertension   . MS (multiple sclerosis) ([place]) 12/15/2016   possible dx. many MRI w/ positive white matter abnormality. MRI reported stable since 2015. mild progressive  Cognitive loss  . Obstructive sleep apnea    on prior neuro notes 10/2016; no results.   . Osteoporosis    had been on reclast  . Post concussion syndrome 10/2013   ongoing issues with concentration, memory loss, lability; EEG completed and normal.   . TBI (traumatic brain injury) ([place]) 2014   fall at [facility], hit head off of floor. No bleed, "concussion". Seen neurology since for focus and word finding.   Marland Kitchen TIA (transient ischemic attack) 11/30/2016   MRI normal. No lateralization. Speech affected and weakness during a hypotensive episdoe.   . Urinary incontinence     Past Surgical History:  Procedure Laterality Date  . BACK SURGERY  02/2015   herniated disc L4/5/6  . BREAST BIOPSY  1988   biopsy  . LAPAROSCOPIC HYSTERECTOMY     partial   . SHOULDER ARTHROSCOPY W/ LABRAL  REPAIR Left 02/13/2016   left shoulder  . Study: cognitive eval  06/23/2016   mild cog-linguistic impairment, difficulty with verbal fluency  . Study: EEG   04/12/2015   Studies: EEG completed and normal.   . Study: MRI  04/28/2016   Impression: numerous non-enhancing flair hyperintense lesions w/in the brain .Suspicious for demyelinating disease such as lyme or MS. Her Lyme test were negative.     There were no vitals filed for this visit.  Subjective Assessment - 09/07/18 1527    Subjective  "I am doing so much better with the medicine downstairs"    Currently in Pain?  No/denies            ADULT SLP TREATMENT - 09/07/18 1527      General Information   Behavior/Cognition  Alert;Cooperative;Pleasant mood      Treatment Provided   Treatment provided  Cognitive-Linquistic      Cognitive-Linquistic Treatment   Treatment focused on  Cognition    Skilled Treatment  Instructed pt on compensations to assist her in the grocery store to reduce sensory overload.  Pt demonstrated memory compensations locating her next ST appoinment in her agenda with mod I. She is using index cards to write down her errands and lists, as we talked about last visit. Pt alternated attention in  moderatelhy complex cognitive linguistic task with occasional min A, with c/o mental fatigue.       Assessment / Recommendations / Plan   Plan  Continue with current plan of care      Progression Toward Goals   Progression toward goals  Progressing toward goals       SLP Education - 09/07/18 1530    Education Details  compensaions to reduce sensory overload in stores,     Person(s) Educated  Patient    Methods  Explanation;Demonstration;Verbal cues;Handout    Comprehension  Verbalized understanding       SLP Short Term Goals - 09/07/18 1531      SLP SHORT TERM GOAL #1   Title  Pt will utilize portable external memory/organization aids for high levl ADL's such as shopping and bill paying with rare min A  over 2 sessions.    Baseline  09/05/18; 09/07/18    Time  3    Period  Weeks    Status  Achieved      SLP SHORT TERM GOAL #2   Title  Pt and family will carryover compensations for higher level executive function tasks with occasional min A over 2 sessions (pre-plan trips, shopping, errands)     Baseline  09/07/18;     Time  3    Period  Weeks    Status  On-going      SLP SHORT TERM GOAL #3   Title  Formal naming assessment to be completed as indicated    Time  2    Period  Weeks    Status  On-going      SLP SHORT TERM GOAL #4   Title  Pt will utilize compensations to process/recall what she has read with rare min A over 2 sessions.    Time  4    Period  Weeks    Status  On-going       SLP Long Term Goals - 09/07/18 1532      SLP LONG TERM GOAL #1   Title  Pt will use external aids to plan her day and complete high level ADL's with supervision cues over 3 sessions    Time  7    Period  Weeks    Status  On-going      SLP LONG TERM GOAL #2   Title  Pt will run 2 errands in a row using compensations with occasional min A over 2 sessions.     Time  7    Period  Weeks    Status  On-going      SLP LONG TERM GOAL #3   Title  Pt will complete high level executive function tasks with use of compensations, 85% accuracy and rare min A over 2 sessions    Time  7    Period  Weeks    Status  On-going      SLP LONG TERM GOAL #4   Title  Pt will retrieve 3 items from a store in less than 20 minutes with rare min A over 2 sessions.    Time  7    Period  Weeks    Status  On-going       Plan - 09/07/18 1531    Clinical Impression Statement  Pt reports improved optimism today. Targeted external compensations for grocery shopping, and running multile errands, due to cognitive impairment and brain over stimulation. Continue skilled ST to maximize independence and QOL in high level ADL's  Speech Therapy Frequency  2x / week    Treatment/Interventions  SLP instruction and  feedback;Cueing hierarchy;Environmental controls;Language facilitation;Compensatory strategies;Functional tasks;Cognitive reorganization;Compensatory techniques;Internal/external aids;Multimodal communcation approach;Patient/family education    Potential to Achieve Goals  Good       Patient will benefit from skilled therapeutic intervention in order to improve the following deficits and impairments:   Cognitive communication deficit    Problem List Patient Active Problem List   Diagnosis Date Noted  . Encounter for monitoring long-term proton pump inhibitor therapy 08/28/2018  . White matter disease 02/20/2018  . Osteoporosis   . Hypercalcemia 01/17/2018  . Essential hypertension 01/16/2018  . Hypothyroidism 01/16/2018  . Depression with anxiety 01/16/2018  . Traumatic brain injury with loss of consciousness ([place]) 01/16/2018  . Hyperlipidemia     Sasha Rueth, Annye Rusk MS, CCC-SLP 09/07/2018, 3:33 PM  [facility] [address] [place], [place], [address] Phone: [phone]   Fax:  [phone]   Name: Abigail Michael MRN: [id number] Date of Birth: 10/01/1951

## 2018-09-07 NOTE — Patient Instructions (Signed)
   Ball cap, sunglasses, foam earplugs to help your brain filter out the lights and noise - especially fluorescent lights    Your brain isn't filtering out all of the sensory input it gets like it used to   Neurofeedback - look it up and see what you think   Use your phone also for Dr. Questions,   Use a bookmark or index cards to mark you place in the cookbook  Consider [employer] or [employer] app to pick up your groceries - have Katharine Look help you if you think it would help  Good job with being mindful and aware of when your stress level is up and using self talk and deep breaths to help manage your brain  You can close your eyes (or use a sleep mask) and use earplugs at home even when you are feeling overstimulated or overwhelmed. Or to recover from errands. That's OK

## 2018-09-18 DIAGNOSIS — R4189 Other symptoms and signs involving cognitive functions and awareness: Secondary | ICD-10-CM | POA: Diagnosis not present

## 2018-09-18 DIAGNOSIS — S060X0S Concussion without loss of consciousness, sequela: Secondary | ICD-10-CM | POA: Diagnosis not present

## 2018-09-19 ENCOUNTER — Ambulatory Visit: Payer: Medicare Other | Attending: *Deleted | Admitting: Speech Pathology

## 2018-09-19 ENCOUNTER — Ambulatory Visit: Payer: Medicare Other

## 2018-09-19 DIAGNOSIS — R41841 Cognitive communication deficit: Secondary | ICD-10-CM

## 2018-09-19 DIAGNOSIS — R2681 Unsteadiness on feet: Secondary | ICD-10-CM | POA: Insufficient documentation

## 2018-09-19 DIAGNOSIS — R2689 Other abnormalities of gait and mobility: Secondary | ICD-10-CM | POA: Diagnosis not present

## 2018-09-19 NOTE — Therapy (Signed)
[facility] [address] [place], [place], [address] Phone: [phone]   Fax:  [phone]  Speech Language Pathology Treatment  Patient Details  Name: Abigail Michael MRN: [id number] Date of Birth: [birth year] Referring Provider (SLP): Dr. Sharlene Dory   Encounter Date: 09/19/2018  End of Session - 09/19/18 1527    Visit Number  6    Number of Visits  17    Date for SLP Re-Evaluation  10/20/18    Authorization Type  VL PT, OT, ST is 75 - zeor used at time of ST eval - I am recommending PT eval - PT eval 09/05/18 - will need to add these into visit limit    Authorization - Visit Number  5    SLP Start Time  0347    SLP Stop Time   1448    SLP Time Calculation (min)  45 min    Activity Tolerance  Patient tolerated treatment well       Past Medical History:  Diagnosis Date  . 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 Chicken pox   . Constipation   . Depression   . Frequent headaches   . GERD (gastroesophageal reflux disease)   . 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 Hyperlipidemia   . Hypertension   . MS (multiple sclerosis) ([place]) 12/15/2016   possible dx. many MRI w/ positive white matter abnormality. MRI reported stable since 2015. mild progressive  Cognitive loss  . Obstructive sleep apnea    on prior neuro notes 10/2016; no results.   . Osteoporosis    had been on reclast  . Post concussion syndrome 10/2013   ongoing issues with concentration, memory loss, lability; EEG completed and normal.   . TBI (traumatic brain injury) ([place]) 2014   fall at [facility], hit head off of floor. No bleed, "concussion". Seen neurology since for focus and word finding.   Marland Kitchen TIA (transient ischemic attack) 11/30/2016   MRI normal. No lateralization. Speech affected and weakness during a hypotensive episdoe.   . Urinary incontinence     Past Surgical History:  Procedure Laterality Date  .  BACK SURGERY  02/2015   herniated disc L4/5/6  . BREAST BIOPSY  1988   biopsy  . LAPAROSCOPIC HYSTERECTOMY     partial   . SHOULDER ARTHROSCOPY W/ LABRAL REPAIR Left 02/13/2016   left shoulder  . Study: cognitive eval  06/23/2016   mild cog-linguistic impairment, difficulty with verbal fluency  . Study: EEG   04/12/2015   Studies: EEG completed and normal.   . Study: MRI  04/28/2016   Impression: numerous non-enhancing flair hyperintense lesions w/in the brain .Suspicious for demyelinating disease such as lyme or MS. Her Lyme test were negative.     There were no vitals filed for this visit.         ADULT SLP TREATMENT - 09/19/18 1503      General Information   Behavior/Cognition  Alert;Cooperative;Pleasant mood      Treatment Provided   Treatment provided  Cognitive-Linquistic      Cognitive-Linquistic Treatment   Treatment focused on  Cognition    Skilled Treatment  Pt reports utilizing strategies of taking breaks, closing her eyes in quiet environment has improved her ability to regroup when running errands. Pt continues to use compensations for medication and scheudle/calendar managment with success. Focused on setting goals and writing them down to facilitate running 2-3  errands at a time with rare min A      Assessment / Recommendations / [place] with current plan of care      Progression Toward Goals   Progression toward goals  Progressing toward goals       SLP Education - 09/19/18 1523    Education Details  positive self talk, affirmations, using breath to reduce anxiety/stress    Person(s) Educated  Patient    Methods  Explanation;Demonstration;Verbal cues;Handout    Comprehension  Verbalized understanding       SLP Short Term Goals - 09/19/18 1526      SLP SHORT TERM GOAL #1   Title  Pt will utilize portable external memory/organization aids for high levl ADL's such as shopping and bill paying with rare min A over 2 sessions.     Baseline  09/05/18; 09/07/18    Time  3    Period  Weeks    Status  Achieved      SLP SHORT TERM GOAL #2   Title  Pt and family will carryover compensations for higher level executive function tasks with occasional min A over 2 sessions (pre-plan trips, shopping, errands)     Baseline  09/07/18; 09/19/18    Time  3    Period  Weeks    Status  Achieved      SLP SHORT TERM GOAL #3   Title  Formal naming assessment to be completed as indicated    Time  2    Period  Weeks    Status  Achieved      SLP SHORT TERM GOAL #4   Title  Pt will utilize compensations to process/recall what she has read with rare min A over 2 sessions.    Time  2    Period  Weeks    Status  On-going       SLP Long Term Goals - 09/19/18 1526      SLP LONG TERM GOAL #1   Title  Pt will use external aids to plan her day and complete high level ADL's with supervision cues over 3 sessions    Time  6    Period  Weeks    Status  On-going      SLP LONG TERM GOAL #2   Title  Pt will run 2 errands in a row using compensations with occasional min A over 2 sessions.     Time  6    Period  Weeks    Status  On-going      SLP LONG TERM GOAL #3   Title  Pt will complete high level executive function tasks with use of compensations, 85% accuracy and rare min A over 2 sessions    Time  6    Period  Weeks    Status  On-going      SLP LONG TERM GOAL #4   Title  Pt will retrieve 3 items from a store in less than 20 minutes with rare min A over 2 sessions.    Time  6    Period  Weeks    Status  On-going       Plan - 09/19/18 1523    Clinical Impression Statement  Pt continues to impletment compensatory strategies for attention, processing and memory for errands, calendar management and medication management. She reports she is using strategie of taking sensory breaks in between errands and calming her brain with self talk to improve success on  multiple errands. Continue skilled ST to maximize independence and QOL in  high level ADL's.    Speech Therapy Frequency  2x / week    Duration  --   8 weeks or 17 visits   Treatment/Interventions  SLP instruction and feedback;Cueing hierarchy;Environmental controls;Language facilitation;Compensatory strategies;Functional tasks;Cognitive reorganization;Compensatory techniques;Internal/external aids;Multimodal communcation approach;Patient/family education    Potential to Achieve Goals  Good       Patient will benefit from skilled therapeutic intervention in order to improve the following deficits and impairments:   Cognitive communication deficit    Problem List Patient Active Problem List   Diagnosis Date Noted  . Encounter for monitoring long-term proton pump inhibitor therapy 08/28/2018  . White matter disease 02/20/2018  . Osteoporosis   . Hypercalcemia 01/17/2018  . Essential hypertension 01/16/2018  . Hypothyroidism 01/16/2018  . Depression with anxiety 01/16/2018  . Traumatic brain injury with loss of consciousness ([place]) 01/16/2018  . Hyperlipidemia     Alveda Vanhorne, Annye Rusk MS, CCC-SLP 09/19/2018, 3:27 PM  [facility] [address] [place], [place], [address] Phone: [phone]   Fax:  [phone]   Name: Abigail Michael MRN: [id number] Date of Birth: 1951/03/21

## 2018-09-19 NOTE — Patient Instructions (Signed)
    TBI Coach or Brain Recovery Coach - You Tube  Brainline - You Tube  Do try ball cap, sunglasses, ear plugs while shopping  Great job with your new positive self talk  Processing conversation in a quiet environment close to the person you are talking to will be different than trying to follow a group conversation in a restaurant or loud store.  Continue taking brain breaks where you are blocking noise and light   Strong smells (detergent, perfume) can also affect your attention and processing - just be aware  Listen to your body - try to get ahead of full on brain fatigue - try to take sensory breaks ahead of full on fatigue  Breathe 2 Relax App  Change What IF? To What IS? What information do you have right now, what is important right now, not in the future.  Go back to your breathing, hand on your chest, close your eyes and listen to and focus on your breath  Exercise classes on YouTube or on TV or yoga   Write goals about reducing self talk and instead switch to positive affirmations; when anxiety starts or brain begins to spin, I will stop, close my eyes and just listen to my breath; Set goal for exercise 2-3x a week etc - these are just examples. Bring in your goals.

## 2018-09-19 NOTE — Therapy (Signed)
[facility] [address] [place], [place], [address] Phone: [phone]   Fax:  [phone]  Physical Therapy Treatment  Patient Details  Name: Abigail Michael MRN: [id number] Date of Birth: [birth year] Referring Provider (PT): Dr. Raoul Pitch   Encounter Date: 09/19/2018  PT End of Session - 09/19/18 2037    Visit Number  2    Number of Visits  9    Date for PT Re-Evaluation  10/11/18    Authorization Type  Medicare + BCBS    PT Start Time  1450    PT Stop Time  1531    PT Time Calculation (min)  41 min    Equipment Utilized During Treatment  Gait belt    Activity Tolerance  Patient tolerated treatment well    Behavior During Therapy  WFL for tasks assessed/performed       Past Medical History:  Diagnosis Date  . 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 Chicken pox   . Constipation   . Depression   . Frequent headaches   . GERD (gastroesophageal reflux disease)   . 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 Hyperlipidemia   . Hypertension   . MS (multiple sclerosis) ([place]) 12/15/2016   possible dx. many MRI w/ positive white matter abnormality. MRI reported stable since 2015. mild progressive  Cognitive loss  . Obstructive sleep apnea    on prior neuro notes 10/2016; no results.   . Osteoporosis    had been on reclast  . Post concussion syndrome 10/2013   ongoing issues with concentration, memory loss, lability; EEG completed and normal.   . TBI (traumatic brain injury) ([place]) 2014   fall at [facility], hit head off of floor. No bleed, "concussion". Seen neurology since for focus and word finding.   Marland Kitchen TIA (transient ischemic attack) 11/30/2016   MRI normal. No lateralization. Speech affected and weakness during a hypotensive episdoe.   . Urinary incontinence     Past Surgical History:  Procedure Laterality Date  . BACK SURGERY  02/2015   herniated disc L4/5/6  . BREAST BIOPSY   1988   biopsy  . LAPAROSCOPIC HYSTERECTOMY     partial   . SHOULDER ARTHROSCOPY W/ LABRAL REPAIR Left 02/13/2016   left shoulder  . Study: cognitive eval  06/23/2016   mild cog-linguistic impairment, difficulty with verbal fluency  . Study: EEG   04/12/2015   Studies: EEG completed and normal.   . Study: MRI  04/28/2016   Impression: numerous non-enhancing flair hyperintense lesions w/in the brain .Suspicious for demyelinating disease such as lyme or MS. Her Lyme test were negative.     There were no vitals filed for this visit.  Subjective Assessment - 09/19/18 2036    Subjective  No new falls to report, pt states that after increased ambulation her feet begin to drag which causes her to lose balance.     Pertinent History  depression, headaches, HTN, MS (possible dx), OSA, post-consussion syndrome, TBI, TIA    Patient Stated Goals  improve balance, feel more steady.    Currently in Pain?  No/denies         [facility] Adult PT Treatment/Exercise - 09/19/18 0001      Ambulation/Gait   Ambulation/Gait  Yes    Ambulation/Gait Assistance  6: Modified independent (Device/Increase time)    Ambulation/Gait Assistance Details  Pt presented with minimal toe drag after increased activity during ambulation.  Ambulation Distance (Feet)  300 Feet    Assistive device  None    Ambulation Surface  Level;Indoor      High Level Balance   High Level Balance Activities  Side stepping;Tandem walking;Other (comment)   SLS, at counter with SUE to no UE support required.      Neuro Re-ed    Neuro Re-ed Details   Pt in parallel bars standing on blue foam beam with SUE support progressing to no UE support performing toe/center/heel taps and side stepping with VC's for posture, pacing and forward gaze.        Pt in corner with narrow BOS/EO performing head nods/turns/diagonals with no UE support required and min/mod sway.              PT Education - 09/19/18 2127    Education Details   Initiated corner balance HEP for balance.     Person(s) Educated  Patient    Methods  Explanation;Demonstration;Tactile cues;Verbal cues;Handout    Comprehension  Verbalized understanding;Returned demonstration;Verbal cues required;Tactile cues required;Need further instruction          PT Long Term Goals - 09/06/18 0739      PT LONG TERM GOAL #1   Title  patient to be independent with advanced HEP for balance    Time  4    Period  Weeks    Status  New    Target Date  10/11/18      PT LONG TERM GOAL #2   Title  patient to improve Berg by >/= 4 points demonstrating reduced fall risk    Time  4    Period  Weeks    Status  New    Target Date  10/11/18      PT LONG TERM GOAL #3   Title  patient to improve FGA by >/= 6 points demonstrating improved functional mobility and reduced fall risk    Time  4    Period  Weeks    Status  New    Target Date  10/11/18      PT LONG TERM GOAL #4   Title  patient to demonstrate Floor <> stand transfers with Mod I without need for UE support on chair/object    Time  4    Period  Weeks    Status  New    Target Date  10/11/18      PT LONG TERM GOAL #5   Title  patient to demosntrate gait over various even and uneven surfaces as well as up/down ramps and inclines for >/= 500 feet with Ind with no LOB    Time  4    Period  Weeks    Status  New    Target Date  10/11/18      Additional Long Term Goals   Additional Long Term Goals  Yes      PT LONG TERM GOAL #6   Title  patient to demonstrate participation in gym/walking program most days of the week    Time  4    Period  Weeks    Status  New    Target Date  10/11/18            Plan - 09/19/18 2128    Clinical Impression Statement  Todays skilled session focused on gait training with no AD with minimal foot drag after increased activity, high level balance on compliant surfaces and initiating balance HEP. Pt should benefit from continued PT sessions to progress towards  goals.      Rehab Potential  Good    PT Frequency  2x / week    PT Duration  4 weeks    PT Treatment/Interventions  ADLs/Self Care Home Management;Gait training;Stair training;Functional mobility training;Therapeutic activities;Therapeutic exercise;Balance training;Patient/family education;Neuromuscular re-education;Manual techniques;Taping;Visual/perceptual remediation/compensation    PT Next Visit Plan  initiate HEP for strengthening, high level balance on rockerboard, negotiating over obstacles on compliant surfaces.    Consulted and Agree with Plan of Care  Patient       Patient will benefit from skilled therapeutic intervention in order to improve the following deficits and impairments:  Decreased balance, Decreased activity tolerance, Decreased mobility, Decreased strength  Visit Diagnosis: Unsteadiness on feet  Other abnormalities of gait and mobility     Problem List Patient Active Problem List   Diagnosis Date Noted  . Encounter for monitoring long-term proton pump inhibitor therapy 08/28/2018  . White matter disease 02/20/2018  . Osteoporosis   . Hypercalcemia 01/17/2018  . Essential hypertension 01/16/2018  . Hypothyroidism 01/16/2018  . Depression with anxiety 01/16/2018  . Traumatic brain injury with loss of consciousness ([place]) 01/16/2018  . Hyperlipidemia    Chassity Felts, PTA  Chassity A Felts 09/19/2018, 9:33 PM  [facility] [address] [place], [place], [address] Phone: [phone]   Fax:  [phone]  Name: Abigail Michael MRN: [id number] Date of Birth: [birth year]

## 2018-09-19 NOTE — Patient Instructions (Signed)
Feet Together, Head Motion - Eyes Open    Standing in corner with chair in front for support, with eyes open, feet together, move head slowly: up and down, side to side, diagonals both ways. Repeat __10__ times per session. Do __1__ sessions per day.   Copyright  VHI. All rights reserved.

## 2018-09-21 ENCOUNTER — Encounter: Payer: Self-pay | Admitting: Speech Pathology

## 2018-09-21 ENCOUNTER — Ambulatory Visit: Payer: Medicare Other | Admitting: Speech Pathology

## 2018-09-21 DIAGNOSIS — R2681 Unsteadiness on feet: Secondary | ICD-10-CM | POA: Diagnosis not present

## 2018-09-21 DIAGNOSIS — R41841 Cognitive communication deficit: Secondary | ICD-10-CM

## 2018-09-21 DIAGNOSIS — R2689 Other abnormalities of gait and mobility: Secondary | ICD-10-CM | POA: Diagnosis not present

## 2018-09-21 NOTE — Therapy (Signed)
[facility] [address] [place], [place], [address] Phone: [phone]   Fax:  [phone]  Speech Language Pathology Treatment  Patient Details  Name: Abigail Michael MRN: [id number] Date of Birth: October 27, 1950 Referring Provider (SLP): Dr. Sharlene Dory   Encounter Date: 09/21/2018  End of Session - 09/21/18 1512    Visit Number  7    Number of Visits  17    Date for SLP Re-Evaluation  10/20/18    Authorization Type  VL PT, OT, ST is 75 - zeor used at time of ST eval - I am recommending PT eval - PT eval 09/05/18 - will need to add these into visit limit    SLP Start Time  1020    SLP Stop Time   1100    SLP Time Calculation (min)  40 min    Activity Tolerance  Patient tolerated treatment well       Past Medical History:  Diagnosis Date  . 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 Chicken pox   . Constipation   . Depression   . Frequent headaches   . GERD (gastroesophageal reflux disease)   . 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 Hyperlipidemia   . Hypertension   . MS (multiple sclerosis) ([place]) 12/15/2016   possible dx. many MRI w/ positive white matter abnormality. MRI reported stable since 2015. mild progressive  Cognitive loss  . Obstructive sleep apnea    on prior neuro notes 10/2016; no results.   . Osteoporosis    had been on reclast  . Post concussion syndrome 10/2013   ongoing issues with concentration, memory loss, lability; EEG completed and normal.   . TBI (traumatic brain injury) ([place]) 2014   fall at [facility], hit head off of floor. No bleed, "concussion". Seen neurology since for focus and word finding.   Marland Kitchen TIA (transient ischemic attack) 11/30/2016   MRI normal. No lateralization. Speech affected and weakness during a hypotensive episdoe.   . Urinary incontinence     Past Surgical History:  Procedure Laterality Date  . BACK SURGERY  02/2015   herniated  disc L4/5/6  . BREAST BIOPSY  1988   biopsy  . LAPAROSCOPIC HYSTERECTOMY     partial   . SHOULDER ARTHROSCOPY W/ LABRAL REPAIR Left 02/13/2016   left shoulder  . Study: cognitive eval  06/23/2016   mild cog-linguistic impairment, difficulty with verbal fluency  . Study: EEG   04/12/2015   Studies: EEG completed and normal.   . Study: MRI  04/28/2016   Impression: numerous non-enhancing flair hyperintense lesions w/in the brain .Suspicious for demyelinating disease such as lyme or MS. Her Lyme test were negative.     There were no vitals filed for this visit.  Subjective Assessment - 09/21/18 1305    Subjective  "I have been closing my eyes and focusing on my breathing - it really helps"    Currently in Pain?  No/denies            ADULT SLP TREATMENT - 09/21/18 1307      General Information   Behavior/Cognition  Alert;Cooperative;Pleasant mood      Treatment Provided   Treatment provided  Cognitive-Linquistic      Cognitive-Linquistic Treatment   Treatment focused on  Cognition    Skilled Treatment  Pt reported utiltilizing strategy we worked on of self advocacy on the phone with her daughter when she  was feeling overwhelmed by a long conversation. Family education by pt continues. Utilized self anchored rating scale to collaborate on next step to meet LTG's. Pt initally rated herself a 1.5 immediately after her fall. Prior to initiating this course of ST, pt rated her self 4.5, and today rates her daily functioning (high level ADL's) at a 6. We determined that legnth of her ability to focus is greatest difficulty. We collaborated on strategies to use external timer to increase time she is able to focus on a task, nd at the time, pt is to take a sensory break and focus on her breathing, when she is ready then return to task, again with use of timer (7 minutes initally, as pt reports she starts to "zone out" after 5 minutes on higher cognitive load tasks. Pt is requesting breaks   to close her eyes and focus on her breath  when she notices increased stress/agitiation during cogntive task today, with mod I.      Assessment / Recommendations / Plan   Plan  Continue with current plan of care      Progression Toward Goals   Progression toward goals  Progressing toward goals       SLP Education - 09/21/18 1509    Education Details  use timer and timed breaks to improve "focus" and attention duirng mentally challenging higher level ADL's    Person(s) Educated  Patient    Methods  Explanation;Verbal cues;Demonstration;Handout   handout hand written as EPIC is down   Comprehension  Verbalized understanding;Returned demonstration;Verbal cues required       SLP Short Term Goals - 09/21/18 1511      SLP SHORT TERM GOAL #1   Title  Pt will utilize portable external memory/organization aids for high levl ADL's such as shopping and bill paying with rare min A over 2 sessions.    Baseline  09/05/18; 09/07/18    Time  3    Period  Weeks    Status  Achieved      SLP SHORT TERM GOAL #2   Title  Pt and family will carryover compensations for higher level executive function tasks with occasional min A over 2 sessions (pre-plan trips, shopping, errands)     Baseline  09/07/18; 09/19/18    Time  3    Period  Weeks    Status  Achieved      SLP SHORT TERM GOAL #3   Title  Formal naming assessment to be completed as indicated    Time  2    Period  Weeks    Status  Achieved      SLP SHORT TERM GOAL #4   Title  Pt will utilize compensations to process/recall what she has read with rare min A over 2 sessions.    Time  2    Period  Weeks    Status  On-going       SLP Long Term Goals - 09/21/18 1512      SLP LONG TERM GOAL #1   Title  Pt will use external aids to plan her day and complete high level ADL's with supervision cues over 3 sessions    Time  6    Period  Weeks    Status  On-going      SLP LONG TERM GOAL #2   Title  Pt will run 2 errands in a row using  compensations with occasional min A over 2 sessions.     Time  6  Period  Weeks    Status  On-going      SLP LONG TERM GOAL #3   Title  Pt will complete high level executive function tasks with use of compensations, 85% accuracy and rare min A over 2 sessions    Time  6    Period  Weeks    Status  On-going      SLP LONG TERM GOAL #4   Title  Pt will retrieve 3 items from a store in less than 20 minutes with rare min A over 2 sessions.    Time  6    Period  Weeks    Status  On-going       Plan - 09/21/18 1511    Clinical Impression Statement  Pt continues to impletment compensatory strategies for attention, processing and memory for errands, calendar management and medication management. She reports she is using strategie of taking sensory breaks in between errands and calming her brain with self talk to improve success on multiple errands. Continue skilled ST to maximize independence and QOL in high level ADL's.    Speech Therapy Frequency  2x / week    Duration  --   8 weeks or 17 visits   Treatment/Interventions  SLP instruction and feedback;Cueing hierarchy;Environmental controls;Language facilitation;Compensatory strategies;Functional tasks;Cognitive reorganization;Compensatory techniques;Internal/external aids;Multimodal communcation approach;Patient/family education    Potential to Achieve Goals  Good       Patient will benefit from skilled therapeutic intervention in order to improve the following deficits and impairments:   Cognitive communication deficit    Problem List Patient Active Problem List   Diagnosis Date Noted  . Encounter for monitoring long-term proton pump inhibitor therapy 08/28/2018  . White matter disease 02/20/2018  . Osteoporosis   . Hypercalcemia 01/17/2018  . Essential hypertension 01/16/2018  . Hypothyroidism 01/16/2018  . Depression with anxiety 01/16/2018  . Traumatic brain injury with loss of consciousness ([place]) 01/16/2018  .  Hyperlipidemia     Lov[place], Annye Rusk MS, CCC-SLP 09/21/2018, 3:13 PM  [facility] [address] [place], [place], [address] Phone: [phone]   Fax:  [phone]   Name: Abigail Michael MRN: [id number] Date of Birth: August 07, 1951

## 2018-09-22 ENCOUNTER — Encounter

## 2018-09-25 DIAGNOSIS — Z114 Encounter for screening for human immunodeficiency virus [HIV]: Secondary | ICD-10-CM | POA: Diagnosis not present

## 2018-09-25 DIAGNOSIS — R419 Unspecified symptoms and signs involving cognitive functions and awareness: Secondary | ICD-10-CM | POA: Diagnosis not present

## 2018-09-25 DIAGNOSIS — H539 Unspecified visual disturbance: Secondary | ICD-10-CM | POA: Diagnosis not present

## 2018-09-25 DIAGNOSIS — Z6831 Body mass index (BMI) 31.0-31.9, adult: Secondary | ICD-10-CM | POA: Diagnosis not present

## 2018-09-25 DIAGNOSIS — R9082 White matter disease, unspecified: Secondary | ICD-10-CM | POA: Diagnosis not present

## 2018-09-25 LAB — LAB REPORT - SCANNED
Antinuclear antibodies: NEGATIVE
HIV: NONREACTIVE
LYME AB IGG BY WB:: NEGATIVE
RPR: NONREACTIVE
Rheumatoid Factor (IgA): <8.6
THYROID PEROXIDASE (TPO) AB: 0.04

## 2018-09-25 LAB — HIV ANTIBODY (ROUTINE TESTING W REFLEX): HIV: NEGATIVE

## 2018-09-25 LAB — VITAMIN D 25 HYDROXY (VIT D DEFICIENCY, FRACTURES): VIT D 25 HYDROXY: 30.5

## 2018-09-25 LAB — BASIC METABOLIC PANEL
Glucose: 102
Potassium: 4.3 (ref 3.4–5.3)

## 2018-09-26 ENCOUNTER — Ambulatory Visit: Payer: Medicare Other | Admitting: Speech Pathology

## 2018-09-26 ENCOUNTER — Ambulatory Visit: Payer: Medicare Other

## 2018-09-26 DIAGNOSIS — R2689 Other abnormalities of gait and mobility: Secondary | ICD-10-CM | POA: Diagnosis not present

## 2018-09-26 DIAGNOSIS — R2681 Unsteadiness on feet: Secondary | ICD-10-CM | POA: Diagnosis not present

## 2018-09-26 DIAGNOSIS — R41841 Cognitive communication deficit: Secondary | ICD-10-CM | POA: Diagnosis not present

## 2018-09-26 NOTE — Therapy (Signed)
[facility] [address] [place], [place], [address] Phone: [phone]   Fax:  [phone]  Speech Language Pathology Treatment  Patient Details  Name: Abigail Michael MRN: [id number] Date of Birth: 06-06-1951 Referring Provider (SLP): Dr. Sharlene Dory   Encounter Date: 09/26/2018  End of Session - 09/26/18 1522    Visit Number  8    Number of Visits  17    Date for SLP Re-Evaluation  10/20/18    Authorization Type  VL PT, OT, ST is 75 - zeor used at time of ST eval - I am recommending PT eval - PT eval 09/05/18 - will need to add these into visit limit    SLP Start Time  1406    SLP Stop Time   1448    SLP Time Calculation (min)  42 min    Activity Tolerance  Patient tolerated treatment well       Past Medical History:  Diagnosis Date  . 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 Chicken pox   . Constipation   . Depression   . Frequent headaches   . GERD (gastroesophageal reflux disease)   . 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 Hyperlipidemia   . Hypertension   . MS (multiple sclerosis) ([place]) 12/15/2016   possible dx. many MRI w/ positive white matter abnormality. MRI reported stable since 2015. mild progressive  Cognitive loss  . Obstructive sleep apnea    on prior neuro notes 10/2016; no results.   . Osteoporosis    had been on reclast  . Post concussion syndrome 10/2013   ongoing issues with concentration, memory loss, lability; EEG completed and normal.   . TBI (traumatic brain injury) ([place]) 2014   fall at [facility], hit head off of floor. No bleed, "concussion". Seen neurology since for focus and word finding.   Marland Kitchen TIA (transient ischemic attack) 11/30/2016   MRI normal. No lateralization. Speech affected and weakness during a hypotensive episdoe.   . Urinary incontinence     Past Surgical History:  Procedure Laterality Date  . BACK SURGERY  02/2015   herniated  disc L4/5/6  . BREAST BIOPSY  1988   biopsy  . LAPAROSCOPIC HYSTERECTOMY     partial   . SHOULDER ARTHROSCOPY W/ LABRAL REPAIR Left 02/13/2016   left shoulder  . Study: cognitive eval  06/23/2016   mild cog-linguistic impairment, difficulty with verbal fluency  . Study: EEG   04/12/2015   Studies: EEG completed and normal.   . Study: MRI  04/28/2016   Impression: numerous non-enhancing flair hyperintense lesions w/in the brain .Suspicious for demyelinating disease such as lyme or MS. Her Lyme test were negative.     There were no vitals filed for this visit.         ADULT SLP TREATMENT - 09/26/18 1426      General Information   Behavior/Cognition  Alert;Cooperative;Pleasant mood      Treatment Provided   Treatment provided  Cognitive-Linquistic      Cognitive-Linquistic Treatment   Treatment focused on  Cognition    Skilled Treatment  Pt enters room tearful after receiving MS diagnosis yesterday. Required time to regroup. She reports she is able to retrieve 3 or more items from a store and run several errands at a time using compensatory strategies. She reports she is loosing her glasses and some other items. Pt to have a designated spot for  her glasses or use chain.       Assessment / Recommendations / Plan   Plan  Continue with current plan of care      Progression Toward Goals   Progression toward goals  Progressing toward goals   decrease to 1x a week        SLP Short Term Goals - 09/26/18 1521      SLP SHORT TERM GOAL #1   Title  Pt will utilize portable external memory/organization aids for high levl ADL's such as shopping and bill paying with rare min A over 2 sessions.    Baseline  09/05/18; 09/07/18    Time  3    Period  Weeks    Status  Achieved      SLP SHORT TERM GOAL #2   Title  Pt and family will carryover compensations for higher level executive function tasks with occasional min A over 2 sessions (pre-plan trips, shopping, errands)      Baseline  09/07/18; 09/19/18    Time  3    Period  Weeks    Status  Achieved      SLP SHORT TERM GOAL #3   Title  Formal naming assessment to be completed as indicated    Time  2    Period  Weeks    Status  Achieved      SLP SHORT TERM GOAL #4   Title  Pt will utilize compensations to process/recall what she has read with rare min A over 2 sessions.    Time  1    Period  Weeks    Status  On-going       SLP Long Term Goals - 09/26/18 1521      SLP LONG TERM GOAL #1   Title  Pt will use external aids to plan her day and complete high level ADL's with supervision cues over 3 sessions    Baseline  09/26/18;     Time  5    Period  Weeks    Status  On-going      SLP LONG TERM GOAL #2   Title  Pt will run 2 errands in a row using compensations with occasional min A over 2 sessions.     Baseline  09/26/18    Time  5    Period  Weeks    Status  On-going      SLP LONG TERM GOAL #3   Title  Pt will complete high level executive function tasks with use of compensations, 85% accuracy and rare min A over 2 sessions    Baseline  09/26/18    Time  5    Period  Weeks    Status  On-going      SLP LONG TERM GOAL #4   Title  Pt will retrieve 3 items from a store in less than 20 minutes with rare min A over 2 sessions.    Baseline  09/26/18    Time  5    Period  Weeks    Status  On-going       Plan - 09/26/18 1516    Clinical Impression Statement  Abigail Michael has demonstrated excellent carryover of compensations for attention, processing and sensory overload. She reports that she is successfully running 2-3 errands at a time by using lists, taking breaks in her car in between errands and pre-planning her trips. She is able to retrieve multiple items from a store in a timely manner with compensatory strategies. Pt  reports she has benefitted from "brainline" videos on Youtube and has had her children watch them to help understand how her brain is functioning. We agree to reduced skilled ST  to 1x a week due to progress and mod I carryover of internal and external compensatory strategies. Suspect pt will d/c in 2-4 weeks if progress continues.     Speech Therapy Frequency  1x /week    Duration  --   8 weeks or 17 visits   Treatment/Interventions  SLP instruction and feedback;Cueing hierarchy;Environmental controls;Language facilitation;Compensatory strategies;Functional tasks;Cognitive reorganization;Compensatory techniques;Internal/external aids;Multimodal communcation approach;Patient/family education    Potential to Achieve Goals  Good       Patient will benefit from skilled therapeutic intervention in order to improve the following deficits and impairments:   Cognitive communication deficit    Problem List Patient Active Problem List   Diagnosis Date Noted  . Encounter for monitoring long-term proton pump inhibitor therapy 08/28/2018  . White matter disease 02/20/2018  . Osteoporosis   . Hypercalcemia 01/17/2018  . Essential hypertension 01/16/2018  . Hypothyroidism 01/16/2018  . Depression with anxiety 01/16/2018  . Traumatic brain injury with loss of consciousness ([place]) 01/16/2018  . Hyperlipidemia     Matthew Pais, Annye Rusk MS, CCC-SLP 09/26/2018, 3:22 PM  [facility] [address] [place], [place], [address] Phone: [phone]   Fax:  [phone]   Name: Abigail Michael MRN: [id number] Date of Birth: [birth year]

## 2018-09-26 NOTE — Therapy (Signed)
[facility] [address] [place], [place], [address] Phone: [phone]   Fax:  [phone]  Physical Therapy Treatment  Patient Details  Name: Abigail Michael MRN: [id number] Date of Birth: 1951/06/23 Referring Provider (PT): Dr. Raoul Pitch   Encounter Date: 09/26/2018  PT End of Session - 09/26/18 1145    Visit Number  3    Number of Visits  9    Date for PT Re-Evaluation  10/11/18    Authorization Type  Medicare + BCBS    PT Start Time  1140    PT Stop Time  1230    PT Time Calculation (min)  50 min    Equipment Utilized During Treatment  Gait belt    Activity Tolerance  Patient tolerated treatment well    Behavior During Therapy  [facility] for tasks assessed/performed       Past Medical History:  Diagnosis Date  . 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 Chicken pox   . Constipation   . Depression   . Frequent headaches   . GERD (gastroesophageal reflux disease)   . 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 Hyperlipidemia   . Hypertension   . MS (multiple sclerosis) ([place]) 12/15/2016   possible dx. many MRI w/ positive white matter abnormality. MRI reported stable since 2015. mild progressive  Cognitive loss  . Obstructive sleep apnea    on prior neuro notes 10/2016; no results.   . Osteoporosis    had been on reclast  . Post concussion syndrome 10/2013   ongoing issues with concentration, memory loss, lability; EEG completed and normal.   . TBI (traumatic brain injury) ([facility]) 2014   fall at [facility], hit head off of floor. No bleed, "concussion". Seen neurology since for focus and word finding.   Marland Kitchen TIA (transient ischemic attack) 11/30/2016   MRI normal. No lateralization. Speech affected and weakness during a hypotensive episdoe.   . Urinary incontinence     Past Surgical History:  Procedure Laterality Date  . BACK SURGERY  02/2015   herniated disc L4/5/6  . BREAST BIOPSY   1988   biopsy  . LAPAROSCOPIC HYSTERECTOMY     partial   . SHOULDER ARTHROSCOPY W/ LABRAL REPAIR Left 02/13/2016   left shoulder  . Study: cognitive eval  06/23/2016   mild cog-linguistic impairment, difficulty with verbal fluency  . Study: EEG   04/12/2015   Studies: EEG completed and normal.   . Study: MRI  04/28/2016   Impression: numerous non-enhancing flair hyperintense lesions w/in the brain .Suspicious for demyelinating disease such as lyme or MS. Her Lyme test were negative.     There were no vitals filed for this visit.  Subjective Assessment - 09/26/18 1140    Subjective  Pt had appt with neurologist yesterday, Dr. confirmed MS, scheduled MRI & spinal tap with/without contrast in January. No falls, HEP is going well, pt states they have been very helpful.     Pertinent History  depression, headaches, HTN, MS (possible dx), OSA, post-consussion syndrome, TBI, TIA    Patient Stated Goals  improve balance, feel more steady.    Currently in Pain?  No/denies        [facility] Adult PT Treatment/Exercise - 09/26/18 1149      Ambulation/Gait   Ambulation/Gait  Yes    Ambulation/Gait Assistance  6: Modified independent (Device/Increase time)    Ambulation/Gait Assistance Details  Pt dribbling ball/perfoming  ball toss/catch during ambulation reaching in all directions with no LOB or foot drag noted, no seated rest break required.     Ambulation Distance (Feet)  230 Feet    Assistive device  None    Ambulation Surface  Level;Indoor      Neuro Re-ed    Neuro Re-ed Details   Pt in parallel bars on rockerboard ant/post/lat performing static standing/head movements with EO progressing to EC with no UE support required, some post. lean noted with correcting after verbal/tactile cues provided.      Pt in parallel bars standing on BOSU performing static standing/heel to toe/lat weightshift with intermittent UE support and min/mod sway.       Exercises   Exercises  Knee/Hip      Knee/Hip  Exercises: Aerobic   Nustep  L1, 56mins   Warmup     Knee/Hip Exercises: Seated   Other Seated Knee/Hip Exercises  Seated DF with RTB, 10reps/3sets         PT Education - 09/26/18 1256    Education Details  Added to strengthening HEP, Educated pt on MS.     Person(s) Educated  Patient    Methods  Explanation;Demonstration;Tactile cues;Verbal cues;Handout    Comprehension  Verbalized understanding;Returned demonstration;Verbal cues required;Tactile cues required;Need further instruction        PT Long Term Goals - 09/06/18 0739      PT LONG TERM GOAL #1   Title  patient to be independent with advanced HEP for balance    Time  4    Period  Weeks    Status  New    Target Date  10/11/18      PT LONG TERM GOAL #2   Title  patient to improve [place] by >/= 4 points demonstrating reduced fall risk    Time  4    Period  Weeks    Status  New    Target Date  10/11/18      PT LONG TERM GOAL #3   Title  patient to improve FGA by >/= 6 points demonstrating improved functional mobility and reduced fall risk    Time  4    Period  Weeks    Status  New    Target Date  10/11/18      PT LONG TERM GOAL #4   Title  patient to demonstrate Floor <> stand transfers with Mod I without need for UE support on chair/object    Time  4    Period  Weeks    Status  New    Target Date  10/11/18      PT LONG TERM GOAL #5   Title  patient to demosntrate gait over various even and uneven surfaces as well as up/down ramps and inclines for >/= 500 feet with Ind with no LOB    Time  4    Period  Weeks    Status  New    Target Date  10/11/18      Additional Long Term Goals   Additional Long Term Goals  Yes      PT LONG TERM GOAL #6   Title  patient to demonstrate participation in gym/walking program most days of the week    Time  4    Period  Weeks    Status  New    Target Date  10/11/18       Plan - 09/26/18 1417    Clinical Impression Statement  Todays skilled session focused on  gait  training while performing tasks reaching in all directions, high level balance on rockerboard/BOSU, Nustep for warmup and education. Therapist educated pt on MS due to recent diagnosis with handout given for more info. Pt is making steady progress and should benefit from continued PT sessions to progress towards goals.     Rehab Potential  Good    PT Frequency  2x / week    PT Duration  4 weeks    PT Treatment/Interventions  ADLs/Self Care Home Management;Gait training;Stair training;Functional mobility training;Therapeutic activities;Therapeutic exercise;Balance training;Patient/family education;Neuromuscular re-education;Manual techniques;Taping;Visual/perceptual remediation/compensation    PT Next Visit Plan  initiate HEP for strengthening, high level balance on rockerboard, negotiating over obstacles on compliant surfaces.    Consulted and Agree with Plan of Care  Patient       Patient will benefit from skilled therapeutic intervention in order to improve the following deficits and impairments:  Decreased balance, Decreased activity tolerance, Decreased mobility, Decreased strength  Visit Diagnosis: Cognitive communication deficit  Unsteadiness on feet  Other abnormalities of gait and mobility     Problem List Patient Active Problem List   Diagnosis Date Noted  . Encounter for monitoring long-term proton pump inhibitor therapy 08/28/2018  . White matter disease 02/20/2018  . Osteoporosis   . Hypercalcemia 01/17/2018  . Essential hypertension 01/16/2018  . Hypothyroidism 01/16/2018  . Depression with anxiety 01/16/2018  . Traumatic brain injury with loss of consciousness ([place]) 01/16/2018  . Hyperlipidemia    Abigail Michael, PTA  Abigail A Michael 09/26/2018, 2:23 PM  [facility] [address] [place], [place], [address] Phone: [phone]   Fax:  [phone]  Name: Abigail Michael MRN: [id number] Date of  Birth: 16-Sep-1951

## 2018-09-26 NOTE — Patient Instructions (Signed)
   Use your eyeglasses chain or have a box where you consistently put your glasses  You have done such a great job - lots of work since you started speech therapy  Great job using strategies to run your errands successfully

## 2018-09-26 NOTE — Patient Instructions (Addendum)
  Multiple Sclerosis Multiple sclerosis (MS) is a disease of the central nervous system. It leads to the loss of the insulating covering of the nerves (myelin sheath) of your brain. When this happens, brain signals do not get sent properly or may not get sent at all. The age of onset of MS varies. What are the causes? The cause of MS is unknown. However, it is more common in the [place] than in the [place]. What increases the risk? There is a higher number of women with MS than men. MS is not an illness that is passed down to you from your family members (inherited). However, your risk of MS is higher if you have a relative with MS. What are the signs or symptoms? The symptoms of MS occur in episodes or attacks. These attacks may last weeks to months. There may be long periods of almost no symptoms between attacks. The symptoms of MS vary. This is because of the many different ways it affects the central nervous system. The main symptoms of MS include:  Vision problems and eye pain.  Numbness.  Weakness.  Inability to move your arms, hands, feet, or legs (paralysis).  Balance problems.  Tremors.  How is this diagnosed? Your health care provider can diagnose MS with the help of imaging exams and lab tests. These may include specialized X-ray exams and spinal fluid tests. The best imaging exam to confirm a diagnosis of MS is an MRI. How is this treated? There is no known cure for MS, but there are medicines that can decrease the number and frequency of attacks. Steroids are often used for short-term relief. Physical and occupational therapy may also help. There are also many new alternative or complementary treatments available to help control the symptoms of MS. Ask your health care provider if any of these other options are right for you. Follow these instructions at home:  Take medicines as directed by your health care provider.  Exercise as directed by  your health care provider. Contact a health care provider if: You begin to feel depressed. Get help right away if:  You develop paralysis.  You have problems with bladder, bowel, or sexual function.  You develop mental changes, such as forgetfulness or mood swings.  You have a period of uncontrolled movements (seizure). This information is not intended to replace advice given to you by your health care provider. Make sure you discuss any questions you have with your health care provider. Document Released: 10/01/2000 Document Revised: 03/11/2016 Document Reviewed: 06/11/2013 Elsevier Interactive Patient Education  2017 [employer] Inc.  Dorsiflexion (Eccentric), (Resistance Band)    Pull foot up against resistance band. Slowly release for 3-5 seconds. Use ____RED____ resistance band. __10_ reps per set, __3_ sets per day, _7__ days per week.  http://ecce.exer.us/1   Copyright  VHI. All rights reserved.

## 2018-09-28 ENCOUNTER — Encounter: Payer: Self-pay | Admitting: Speech Pathology

## 2018-09-29 ENCOUNTER — Ambulatory Visit: Payer: Medicare Other

## 2018-09-29 DIAGNOSIS — R41841 Cognitive communication deficit: Secondary | ICD-10-CM

## 2018-09-29 DIAGNOSIS — R2681 Unsteadiness on feet: Secondary | ICD-10-CM

## 2018-09-29 DIAGNOSIS — R2689 Other abnormalities of gait and mobility: Secondary | ICD-10-CM

## 2018-09-29 NOTE — Therapy (Signed)
[facility] [address] [place], [place], [address] Phone: [phone]   Fax:  [phone]  Physical Therapy Treatment  Patient Details  Name: Abigail Michael MRN: [id number] Date of Birth: May 17, 1951 Referring Provider (PT): Dr. Raoul Pitch   Encounter Date: 09/29/2018  PT End of Session - 09/29/18 1110    Visit Number  4    Number of Visits  9    Date for PT Re-Evaluation  10/11/18    Authorization Type  Medicare + BCBS    PT Start Time  1105    PT Stop Time  1145    PT Time Calculation (min)  40 min    Equipment Utilized During Treatment  Gait belt    Activity Tolerance  Patient tolerated treatment well    Behavior During Therapy  WFL for tasks assessed/performed       Past Medical History:  Diagnosis Date  . 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 Chicken pox   . Constipation   . Depression   . Frequent headaches   . GERD (gastroesophageal reflux disease)   . 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 Hyperlipidemia   . Hypertension   . MS (multiple sclerosis) ([place]) 12/15/2016   possible dx. many MRI w/ positive white matter abnormality. MRI reported stable since 2015. mild progressive  Cognitive loss  . Obstructive sleep apnea    on prior neuro notes 10/2016; no results.   . Osteoporosis    had been on reclast  . Post concussion syndrome 10/2013   ongoing issues with concentration, memory loss, lability; EEG completed and normal.   . TBI (traumatic brain injury) ([place]) 2014   fall at [facility], hit head off of floor. No bleed, "concussion". Seen neurology since for focus and word finding.   Marland Kitchen TIA (transient ischemic attack) 11/30/2016   MRI normal. No lateralization. Speech affected and weakness during a hypotensive episdoe.   . Urinary incontinence     Past Surgical History:  Procedure Laterality Date  . BACK SURGERY  02/2015   herniated disc L4/5/6  . BREAST BIOPSY   1988   biopsy  . LAPAROSCOPIC HYSTERECTOMY     partial   . SHOULDER ARTHROSCOPY W/ LABRAL REPAIR Left 02/13/2016   left shoulder  . Study: cognitive eval  06/23/2016   mild cog-linguistic impairment, difficulty with verbal fluency  . Study: EEG   04/12/2015   Studies: EEG completed and normal.   . Study: MRI  04/28/2016   Impression: numerous non-enhancing flair hyperintense lesions w/in the brain .Suspicious for demyelinating disease such as lyme or MS. Her Lyme test were negative.     There were no vitals filed for this visit.  Subjective Assessment - 09/29/18 1104    Subjective  No falls to report, wasn't able to review MS booklet. HEP is going well, still challenging. Pt reports soreness on Lat apscent of bil feet, may be from recent shoe change.     Pertinent History  depression, headaches, HTN, MS (possible dx), OSA, post-consussion syndrome, TBI, TIA    Patient Stated Goals  improve balance, feel more steady.         [facility] Adult PT Treatment/Exercise - 09/29/18 1117      Ambulation/Gait   Ambulation/Gait  Yes    Ambulation/Gait Assistance  5: Supervision;4: Min guard    Ambulation/Gait Assistance Details  Gait with no AD with distraction performing ABC's while stating foods  that starts with each letter for distraction/cognition, min/mod deviation from path during activity with no LOB. Supervision to min guard assistance required. VC's to stay on path for forward gaze.     Ambulation Distance (Feet)  400 Feet    Assistive device  None    Ambulation Surface  Level;Indoor      Neuro Re-ed    Neuro Re-ed Details    In parallel bars negotiating over foam beams progressing to orange hurdles forwards/side stepping with BUE support/step to pattern progressing to no UE support/alt pattern with no LOB, pt required 2 seated rest breaks during activity.    Pt in parallel bars performing toe taps to cones progressing from BUE support to no UE support with no LOB noted, pt had trouble  initiating LE to tap cones towards end of activity.     In parallel bars performing toe taps to colored circles some trouble initiating BLE's to tap and tapping proper foot to proper color.          PT Education - 09/29/18 1308    Education Details  Educated pt on the importance of energy conservation during activity.     Person(s) Educated  Patient    Methods  Explanation    Comprehension  Verbalized understanding;Need further instruction          PT Long Term Goals - 09/06/18 0739      PT LONG TERM GOAL #1   Title  patient to be independent with advanced HEP for balance    Time  4    Period  Weeks    Status  New    Target Date  10/11/18      PT LONG TERM GOAL #2   Title  patient to improve Berg by >/= 4 points demonstrating reduced fall risk    Time  4    Period  Weeks    Status  New    Target Date  10/11/18      PT LONG TERM GOAL #3   Title  patient to improve FGA by >/= 6 points demonstrating improved functional mobility and reduced fall risk    Time  4    Period  Weeks    Status  New    Target Date  10/11/18      PT LONG TERM GOAL #4   Title  patient to demonstrate Floor <> stand transfers with Mod I without need for UE support on chair/object    Time  4    Period  Weeks    Status  New    Target Date  10/11/18      PT LONG TERM GOAL #5   Title  patient to demosntrate gait over various even and uneven surfaces as well as up/down ramps and inclines for >/= 500 feet with Ind with no LOB    Time  4    Period  Weeks    Status  New    Target Date  10/11/18      Additional Long Term Goals   Additional Long Term Goals  Yes      PT LONG TERM GOAL #6   Title  patient to demonstrate participation in gym/walking program most days of the week    Time  4    Period  Weeks    Status  New    Target Date  10/11/18            Plan - 09/29/18 1309    Clinical Impression  Statement  Todays skilled session focused on gait training with cognition challenges,  high level balance stepping over various sized hurdles, toe taps to cones/colored circles progressing to no UE support required with no LOB. Pt should benefit from continued PT sessions to progress towards LTG's.     Rehab Potential  Good    PT Frequency  2x / week    PT Duration  4 weeks    PT Treatment/Interventions  ADLs/Self Care Home Management;Gait training;Stair training;Functional mobility training;Therapeutic activities;Therapeutic exercise;Balance training;Patient/family education;Neuromuscular re-education;Manual techniques;Taping;Visual/perceptual remediation/compensation    PT Next Visit Plan  Add to HEP for strengthening, high level balance on rockerboard, negotiating over obstacles on compliant surfaces, cognition challenges.     Consulted and Agree with Plan of Care  Patient       Patient will benefit from skilled therapeutic intervention in order to improve the following deficits and impairments:  Decreased balance, Decreased activity tolerance, Decreased mobility, Decreased strength  Visit Diagnosis: Cognitive communication deficit  Unsteadiness on feet  Other abnormalities of gait and mobility     Problem List Patient Active Problem List   Diagnosis Date Noted  . Encounter for monitoring long-term proton pump inhibitor therapy 08/28/2018  . White matter disease 02/20/2018  . Osteoporosis   . Hypercalcemia 01/17/2018  . Essential hypertension 01/16/2018  . Hypothyroidism 01/16/2018  . Depression with anxiety 01/16/2018  . Traumatic brain injury with loss of consciousness ([place]) 01/16/2018  . Hyperlipidemia    Abigail Michael, PTA  Abigail A Michael 09/29/2018, 1:12 PM  [facility] [address] [place], [place], [address] Phone: [phone]   Fax:  [phone]  Name: Abigail Michael MRN: [id number] Date of Birth: 10-15-1951

## 2018-10-03 ENCOUNTER — Encounter: Payer: Self-pay | Admitting: Speech Pathology

## 2018-10-05 ENCOUNTER — Encounter: Payer: Self-pay | Admitting: Speech Pathology

## 2018-10-05 ENCOUNTER — Ambulatory Visit: Payer: Medicare Other

## 2018-10-05 ENCOUNTER — Ambulatory Visit: Payer: Medicare Other | Admitting: Speech Pathology

## 2018-10-05 DIAGNOSIS — R41841 Cognitive communication deficit: Secondary | ICD-10-CM

## 2018-10-05 DIAGNOSIS — R2681 Unsteadiness on feet: Secondary | ICD-10-CM

## 2018-10-05 DIAGNOSIS — R2689 Other abnormalities of gait and mobility: Secondary | ICD-10-CM

## 2018-10-05 NOTE — Patient Instructions (Addendum)
Access Code: [email or web address]  URL: [email or web address]  Date: 10/05/2018  Prepared by: Geoffry Paradise   Exercises  Clamshell - 10 reps - 2 sets - 2 hold - 1x daily - 3x weekly  Squat with Chair Support - 10 reps - 2 sets - 2 hold - 1x daily - 3x weekly  Sit to Stand on Foam Pad - 10 reps - 1 sets - 1x daily - 5x weekly     MS Society website: [email or web address]

## 2018-10-05 NOTE — Therapy (Signed)
[facility] [address] [place], [place], [address] Phone: [phone]   Fax:  [phone]  Speech Language Pathology Treatment  Patient Details  Name: Abigail Michael MRN: [id number] Date of Birth: 1951-06-22 Referring Provider (SLP): Dr. Sharlene Dory   Encounter Date: 10/05/2018  End of Session - 10/05/18 1212    Visit Number  9    Number of Visits  17    Date for SLP Re-Evaluation  10/20/18    Authorization - Visit Number  6    SLP Start Time  3354    SLP Stop Time   1059    SLP Time Calculation (min)  43 min       Past Medical History:  Diagnosis Date  . 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 Chicken pox   . Constipation   . Depression   . Frequent headaches   . GERD (gastroesophageal reflux disease)   . 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 Hyperlipidemia   . Hypertension   . MS (multiple sclerosis) ([place]) 12/15/2016   possible dx. many MRI w/ positive white matter abnormality. MRI reported stable since 2015. mild progressive  Cognitive loss  . Obstructive sleep apnea    on prior neuro notes 10/2016; no results.   . Osteoporosis    had been on reclast  . Post concussion syndrome 10/2013   ongoing issues with concentration, memory loss, lability; EEG completed and normal.   . TBI (traumatic brain injury) ([place]) 2014   fall at [facility], hit head off of floor. No bleed, "concussion". Seen neurology since for focus and word finding.   Marland Kitchen TIA (transient ischemic attack) 11/30/2016   MRI normal. No lateralization. Speech affected and weakness during a hypotensive episdoe.   . Urinary incontinence     Past Surgical History:  Procedure Laterality Date  . BACK SURGERY  02/2015   herniated disc L4/5/6  . BREAST BIOPSY  1988   biopsy  . LAPAROSCOPIC HYSTERECTOMY     partial   . SHOULDER ARTHROSCOPY W/ LABRAL REPAIR Left 02/13/2016   left shoulder  . Study: cognitive  eval  06/23/2016   mild cog-linguistic impairment, difficulty with verbal fluency  . Study: EEG   04/12/2015   Studies: EEG completed and normal.   . Study: MRI  04/28/2016   Impression: numerous non-enhancing flair hyperintense lesions w/in the brain .Suspicious for demyelinating disease such as lyme or MS. Her Lyme test were negative.     There were no vitals filed for this visit.  Subjective Assessment - 10/05/18 1019    Subjective  "I've gone shopping and ran a couple errands in a row - you know how hard that has been for me"    Patient is accompained by:  Family member    Currently in Pain?  No/denies            ADULT SLP TREATMENT - 10/05/18 1020      General Information   Behavior/Cognition  Alert;Cooperative;Pleasant mood      Treatment Provided   Treatment provided  Cognitive-Linquistic      Pain Assessment   Pain Assessment  No/denies pain      Cognitive-Linquistic Treatment   Treatment focused on  Cognition    Skilled Treatment  Pt reports utilizing strategies of lists, taking breaks in her car in between errands, and using self talk to refocus. Encouraged her to limit her time in stores,  get her list out before she starts shopping. Pt reports completing high level executive function of bill paying, house deocorating, and meal planning with use of external and internal compensatory strategies for attentionand memory. She reports ongoing difficulty retriving multiple items (3) from store in a timely manner as "I get distracted and start looking around" Collaboratively generated strategy of going by list first, getting out list as soon as she is in the strore and retriving those items 1st, before browsing. Collaborated with pt re: questions to list re: MS for her next visit to neurology.       Assessment / Recommendations / Plan   Plan  Continue with current plan of care      Progression Toward Goals   Progression toward goals  Progressing toward goals       SLP  Education - 10/05/18 1200    Education Details  attend to list 1st to retrive items, then allow for browsing ; bring written questions to MD appointment    Person(s) Educated  Patient    Methods  Explanation;Verbal cues;Handout    Comprehension  Verbalized understanding       SLP Short Term Goals - 10/05/18 1205      SLP SHORT TERM GOAL #1   Title  Pt will utilize portable external memory/organization aids for high levl ADL's such as shopping and bill paying with rare min A over 2 sessions.    Baseline  09/05/18; 09/07/18    Time  3    Period  Weeks    Status  Achieved      SLP SHORT TERM GOAL #2   Title  Pt and family will carryover compensations for higher level executive function tasks with occasional min A over 2 sessions (pre-plan trips, shopping, errands)     Baseline  09/07/18; 09/19/18    Time  3    Period  Weeks    Status  Achieved      SLP SHORT TERM GOAL #3   Title  Formal naming assessment to be completed as indicated    Time  2    Period  Weeks    Status  Achieved      SLP SHORT TERM GOAL #4   Title  Pt will utilize compensations to process/recall what she has read with rare min A over 2 sessions.    Time  1    Period  Weeks    Status  On-going       SLP Long Term Goals - 10/05/18 1205      SLP LONG TERM GOAL #1   Title  Pt will use external aids to plan her day and complete high level ADL's with supervision cues over 3 sessions    Baseline  09/26/18; 10/06/11    Time  5    Period  Weeks    Status  On-going      SLP LONG TERM GOAL #2   Title  Pt will run 2 errands in a row using compensations with occasional min A over 2 sessions.     Baseline  09/26/18;10/05/18    Time  5    Period  Weeks    Status  Achieved      SLP LONG TERM GOAL #3   Title  Pt will complete high level executive function tasks with use of compensations, 85% accuracy and rare min A over 2 sessions    Baseline  09/26/18' 10/05/18    Time  5    Period  Weeks    Status  Achieved       SLP LONG TERM GOAL #4   Title  Pt will retrieve 3 items from a store in less than 20 minutes with rare min A over 2 sessions.    Baseline  09/26/18    Time  5    Period  Weeks    Status  On-going       Plan - 10/05/18 1201    Clinical Impression Statement  Pt continues to report success implementing compensations for attention, memory and processing speech. She remains inconsistent with retriving multiple items in a store due to "distractions" Pt instructed to use list as soon as she enters a store. She has completed meal planning, house decorating and planning out and completing erranrds with mod I (executive function goal met). Continue skilled ST 1x a week to maximize carryover of compensations for attention and memory.     Speech Therapy Frequency  1x /week    Duration  --   8 weeks or 17 visits   Treatment/Interventions  SLP instruction and feedback;Cueing hierarchy;Environmental controls;Language facilitation;Compensatory strategies;Functional tasks;Cognitive reorganization;Compensatory techniques;Internal/external aids;Multimodal communcation approach;Patient/family education    Potential to Achieve Goals  Good       Patient will benefit from skilled therapeutic intervention in order to improve the following deficits and impairments:   Cognitive communication deficit    Problem List Patient Active Problem List   Diagnosis Date Noted  . Encounter for monitoring long-term proton pump inhibitor therapy 08/28/2018  . White matter disease 02/20/2018  . Osteoporosis   . Hypercalcemia 01/17/2018  . Essential hypertension 01/16/2018  . Hypothyroidism 01/16/2018  . Depression with anxiety 01/16/2018  . Traumatic brain injury with loss of consciousness ([place]) 01/16/2018  . Hyperlipidemia     Lovvorn, Annye Rusk MS, [place] 10/05/2018, 12:14 PM  [facility] [address] [place], [place], [address] Phone:  [phone]   Fax:  [phone]   Name: Kamden Reber MRN: [id number] Date of Birth: 1951/07/25

## 2018-10-05 NOTE — Therapy (Signed)
[facility] [address] [place], [place], [address] Phone: [phone]   Fax:  [phone]  Physical Therapy Treatment  Patient Details  Name: Abigail Michael MRN: [id number] Date of Birth: 03/23/1951 Referring Provider (PT): Dr. Raoul Pitch   Encounter Date: 10/05/2018  PT End of Session - 10/05/18 1021    Visit Number  5    Number of Visits  9    Date for PT Re-Evaluation  10/11/18    Authorization Type  Medicare + BCBS    PT Start Time  0932    PT Stop Time  1013    PT Time Calculation (min)  41 min    Equipment Utilized During Treatment  --   min guard to S for safety   Activity Tolerance  Patient tolerated treatment well   rest breaks 2/2 fatigue   Behavior During Therapy  [facility] for tasks assessed/performed       Past Medical History:  Diagnosis Date  . 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 Chicken pox   . Constipation   . Depression   . Frequent headaches   . GERD (gastroesophageal reflux disease)   . 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 Hyperlipidemia   . Hypertension   . MS (multiple sclerosis) ([place]) 12/15/2016   possible dx. many MRI w/ positive white matter abnormality. MRI reported stable since 2015. mild progressive  Cognitive loss  . Obstructive sleep apnea    on prior neuro notes 10/2016; no results.   . Osteoporosis    had been on reclast  . Post concussion syndrome 10/2013   ongoing issues with concentration, memory loss, lability; EEG completed and normal.   . TBI (traumatic brain injury) ([place]) 2014   fall at [facility], hit head off of floor. No bleed, "concussion". Seen neurology since for focus and word finding.   Marland Kitchen TIA (transient ischemic attack) 11/30/2016   MRI normal. No lateralization. Speech affected and weakness during a hypotensive episdoe.   . Urinary incontinence     Past Surgical History:  Procedure Laterality Date  . BACK SURGERY   02/2015   herniated disc L4/5/6  . BREAST BIOPSY  1988   biopsy  . LAPAROSCOPIC HYSTERECTOMY     partial   . SHOULDER ARTHROSCOPY W/ LABRAL REPAIR Left 02/13/2016   left shoulder  . Study: cognitive eval  06/23/2016   mild cog-linguistic impairment, difficulty with verbal fluency  . Study: EEG   04/12/2015   Studies: EEG completed and normal.   . Study: MRI  04/28/2016   Impression: numerous non-enhancing flair hyperintense lesions w/in the brain .Suspicious for demyelinating disease such as lyme or MS. Her Lyme test were negative.     There were no vitals filed for this visit.  Subjective Assessment - 10/05/18 0936    Subjective  Pt denied falls but reported she did bump into the wall this morning while rushing to get ready. Pt read MS handout.     Pertinent History  depression, headaches, HTN, MS (possible dx), OSA, post-consussion syndrome, TBI, TIA    Patient Stated Goals  improve balance, feel more steady.    Currently in Pain?  No/denies              [place]x: Access Code: [email or web address]  URL: [email or web address]  Date: 10/05/2018  Prepared by: Geoffry Paradise   Exercises  Clamshell - 10 reps - 2 sets - 2 hold - 1x  daily - 3x weekly  Squat with Chair Support - 10 reps - 2 sets - 2 hold - 1x daily - 3x weekly  Sit to Stand on Foam Pad - 10 reps - 1 sets - 1x daily - 5x weekly                 Balance Exercises - 10/05/18 1008      Balance Exercises: Standing   Standing Eyes Opened  Narrow base of support (BOS);Wide (BOA);Head turns;Foam/compliant surface;3 reps;5 reps;10 secs;30 secs   10 reps on airex   Standing Eyes Closed  Narrow base of support (BOS);Wide (BOA);Head turns;Foam/compliant surface;2 reps;5 reps;10 secs   on airex   Other Standing Exercises  Performed on airex pad with min guard  to ensure safety. Cues and demo for technique.         PT Education - 10/05/18 1021    Education Details  PT provided pt with strengthening  HEP and MS Society information.     Person(s) Educated  Patient    Methods  Explanation;Demonstration;Verbal cues;Handout;Tactile cues    Comprehension  Returned demonstration;Verbalized understanding          PT Long Term Goals - 09/06/18 0739      PT LONG TERM GOAL #1   Title  patient to be independent with advanced HEP for balance    Time  4    Period  Weeks    Status  New    Target Date  10/11/18      PT LONG TERM GOAL #2   Title  patient to improve Berg by >/= 4 points demonstrating reduced fall risk    Time  4    Period  Weeks    Status  New    Target Date  10/11/18      PT LONG TERM GOAL #3   Title  patient to improve FGA by >/= 6 points demonstrating improved functional mobility and reduced fall risk    Time  4    Period  Weeks    Status  New    Target Date  10/11/18      PT LONG TERM GOAL #4   Title  patient to demonstrate Floor <> stand transfers with Mod I without need for UE support on chair/object    Time  4    Period  Weeks    Status  New    Target Date  10/11/18      PT LONG TERM GOAL #5   Title  patient to demosntrate gait over various even and uneven surfaces as well as up/down ramps and inclines for >/= 500 feet with Ind with no LOB    Time  4    Period  Weeks    Status  New    Target Date  10/11/18      Additional Long Term Goals   Additional Long Term Goals  Yes      PT LONG TERM GOAL #6   Title  patient to demonstrate participation in gym/walking program most days of the week    Time  4    Period  Weeks    Status  New    Target Date  10/11/18            Plan - 10/05/18 1022    Clinical Impression Statement  Today's skilled session focused on establishing BLE strengthening program, as pt reports issues with squatting down to retrieve items from floor and during SLS activities. Pt  noted to experience incr. postural sway during actvities on compliant surface with narrow boS. Pt would continue to benefit from skilled PT to improve  safety during functional mobility.     Rehab Potential  Good    PT Frequency  2x / week    PT Duration  4 weeks    PT Treatment/Interventions  ADLs/Self Care Home Management;Gait training;Stair training;Functional mobility training;Therapeutic activities;Therapeutic exercise;Balance training;Patient/family education;Neuromuscular re-education;Manual techniques;Taping;Visual/perceptual remediation/compensation    PT Next Visit Plan  Delay goals by one week? high level balance on rockerboard, negotiating over obstacles on compliant surfaces, cognition challenges.     PT Home Exercise Plan  [email or web address]     Consulted and Agree with Plan of Care  Patient       Patient will benefit from skilled therapeutic intervention in order to improve the following deficits and impairments:  Decreased balance, Decreased activity tolerance, Decreased mobility, Decreased strength  Visit Diagnosis: Other abnormalities of gait and mobility  Unsteadiness on feet     Problem List Patient Active Problem List   Diagnosis Date Noted  . Encounter for monitoring long-term proton pump inhibitor therapy 08/28/2018  . White matter disease 02/20/2018  . Osteoporosis   . Hypercalcemia 01/17/2018  . Essential hypertension 01/16/2018  . Hypothyroidism 01/16/2018  . Depression with anxiety 01/16/2018  . Traumatic brain injury with loss of consciousness ([place]) 01/16/2018  . Hyperlipidemia     Tekia Waterbury L 10/05/2018, 10:25 AM  [facility] [address] [place], [place], [address] Phone: [phone]   Fax:  [phone]  Name: Abigail Michael MRN: [id number] Date of Birth: May 11, 1951  Geoffry Paradise, PT,DPT 10/05/18 10:25 AM Phone: [phone] Fax: [phone]

## 2018-10-05 NOTE — Patient Instructions (Addendum)
  This week, work on getting list done 1st in a store, 3 items in less than 20 minutes, then you can take your time shopping  You must use timers when you cook  Limit your time in the stores - get your list out before you start to shopping. Once your list is done, then you can mess around if you want, but at least the list is done and pressure is off.  Write down list of questions for your MS doctor 1. What can I expect physically, cognitively, speech/swallowing, bladder? 2. Can this affect my fatigue level? 3. What type of MS do I have? What are the types of MS? 4. Are there support groups you recommend? 5. Where are my lesions? 6. Copy of CT 7. Is there a program/schedule to help control my bladder 8. I am over heating some 9. What should I be looking for? 10. How much exercise is too much?  Keep a regular sleep/bed time schedule as best you can

## 2018-10-06 DIAGNOSIS — I7300 Raynaud's syndrome without gangrene: Secondary | ICD-10-CM | POA: Insufficient documentation

## 2018-10-09 ENCOUNTER — Other Ambulatory Visit: Payer: Self-pay

## 2018-10-09 MED ORDER — AMLODIPINE BESYLATE 10 MG PO TABS: 10.0000 mg | ORAL_TABLET | Freq: Every day | ORAL | 4 refills | Status: DC

## 2018-10-09 NOTE — Telephone Encounter (Signed)
Pt requesting a refill on her Amlodipine 10mg . Pt has an OV in 08/2018 w follow up suggested in 6 mos. Refill medications w 4 additional refills. Due for OV in May 2020.

## 2018-10-17 ENCOUNTER — Encounter: Payer: Self-pay | Admitting: Speech Pathology

## 2018-10-17 ENCOUNTER — Ambulatory Visit: Payer: Self-pay

## 2018-10-19 ENCOUNTER — Ambulatory Visit: Payer: Medicare Other | Attending: *Deleted | Admitting: Speech Pathology

## 2018-10-19 ENCOUNTER — Encounter: Payer: Self-pay | Admitting: Speech Pathology

## 2018-10-19 ENCOUNTER — Ambulatory Visit: Payer: Medicare Other

## 2018-10-19 DIAGNOSIS — R41841 Cognitive communication deficit: Secondary | ICD-10-CM | POA: Diagnosis not present

## 2018-10-19 DIAGNOSIS — R2681 Unsteadiness on feet: Secondary | ICD-10-CM

## 2018-10-19 DIAGNOSIS — M6281 Muscle weakness (generalized): Secondary | ICD-10-CM

## 2018-10-19 DIAGNOSIS — R2689 Other abnormalities of gait and mobility: Secondary | ICD-10-CM

## 2018-10-19 NOTE — Patient Instructions (Addendum)
  When you go back to reading, eliminate all noise, fluorescent lights, music, appliances  Use an index card or book mark to help you focus  Stay away from negative influences  Continue to advocate for yourself and making sure you understand everything you need to  Listen to your body and take breaks

## 2018-10-19 NOTE — Therapy (Signed)
[facility] [address] [place], [place], [address] Phone: [phone]   Fax:  [phone]  Physical Therapy Treatment  Patient Details  Name: Abigail Michael MRN: [id number] Date of Birth: 09-11-1951 Referring Provider (PT): Dr. Raoul Pitch   Encounter Date: 10/19/2018  PT End of Session - 10/19/18 1648    Visit Number  6    Number of Visits  22    Date for PT Re-Evaluation  --   original POC: 10/11/18   Authorization Type  Medicare + BCBS    PT Start Time  1321   pt in bathroom   PT Stop Time  1400    PT Time Calculation (min)  39 min    Equipment Utilized During Treatment  Gait belt    Activity Tolerance  Patient tolerated treatment well    Behavior During Therapy  WFL for tasks assessed/performed   with some bouts of tearfulness 2/2 memory and balance issues      Past Medical History:  Diagnosis Date  . 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 Chicken pox   . Constipation   . Depression   . Frequent headaches   . GERD (gastroesophageal reflux disease)   . 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 Hyperlipidemia   . Hypertension   . MS (multiple sclerosis) ([place]) 12/15/2016   possible dx. many MRI w/ positive white matter abnormality. MRI reported stable since 2015. mild progressive  Cognitive loss  . Obstructive sleep apnea    on prior neuro notes 10/2016; no results.   . Osteoporosis    had been on reclast  . Post concussion syndrome 10/2013   ongoing issues with concentration, memory loss, lability; EEG completed and normal.   . TBI (traumatic brain injury) ([facility]) 2014   fall at [facility], hit head off of floor. No bleed, "concussion". Seen neurology since for focus and word finding.   Marland Kitchen TIA (transient ischemic attack) 11/30/2016   MRI normal. No lateralization. Speech affected and weakness during a hypotensive episdoe.   . Urinary incontinence     Past Surgical History:   Procedure Laterality Date  . BACK SURGERY  02/2015   herniated disc L4/5/6  . BREAST BIOPSY  1988   biopsy  . LAPAROSCOPIC HYSTERECTOMY     partial   . SHOULDER ARTHROSCOPY W/ LABRAL REPAIR Left 02/13/2016   left shoulder  . Study: cognitive eval  06/23/2016   mild cog-linguistic impairment, difficulty with verbal fluency  . Study: EEG   04/12/2015   Studies: EEG completed and normal.   . Study: MRI  04/28/2016   Impression: numerous non-enhancing flair hyperintense lesions w/in the brain .Suspicious for demyelinating disease such as lyme or MS. Her Lyme test were negative.     There were no vitals filed for this visit.  Subjective Assessment - 10/19/18 1323    Subjective  Pt denied falls or changes since last visit.    Pertinent History  depression, headaches, HTN, MS (possible dx), OSA, post-consussion syndrome, TBI, TIA    Patient Stated Goals  improve balance, feel more steady.    Currently in Pain?  No/denies         [facility] PT Assessment - 10/19/18 1343      Functional Gait  Assessment   Gait assessed   Yes    Gait Level Surface  Walks 20 ft in less than 7 sec but greater than 5.5 sec, uses  assistive device, slower speed, mild gait deviations, or deviates 6-10 in outside of the 12 in walkway width.   6.57 sec.    Change in Gait Speed  Makes only minor adjustments to walking speed, or accomplishes a change in speed with significant gait deviations, deviates 10-15 in outside the 12 in walkway width, or changes speed but loses balance but is able to recover and continue walking.    Gait with Horizontal Head Turns  Performs head turns smoothly with slight change in gait velocity (eg, minor disruption to smooth gait path), deviates 6-10 in outside 12 in walkway width, or uses an assistive device.    Gait with Vertical Head Turns  Performs task with slight change in gait velocity (eg, minor disruption to smooth gait path), deviates 6 - 10 in outside 12 in walkway width or uses  assistive device    Gait and Pivot Turn  Pivot turns safely within 3 sec and stops quickly with no loss of balance.    Step Over Obstacle  Is able to step over 2 stacked shoe boxes taped together (9 in total height) without changing gait speed. No evidence of imbalance.    Gait with Narrow Base of Support  Is able to ambulate for 10 steps heel to toe with no staggering.   able to use momentum vs. static standing for tandem gait   Gait with Eyes Closed  Walks 20 ft, slow speed, abnormal gait pattern, evidence for imbalance, deviates 10-15 in outside 12 in walkway width. Requires more than 9 sec to ambulate 20 ft.    Ambulating Backwards  Walks 20 ft, uses assistive device, slower speed, mild gait deviations, deviates 6-10 in outside 12 in walkway width.    Steps  Alternating feet, must use rail.    Total Score  21    FGA comment:  21/30: indicates medium falls risk.                    [facility] PT Treatment/Exercise - 10/19/18 1326      Standardized Balance Assessment   Standardized Balance Assessment  Berg Balance Test      Berg Balance Test   Sit to Stand  Able to stand without using hands and stabilize independently    Standing Unsupported  Able to stand safely 2 minutes    Sitting with Back Unsupported but Feet Supported on Floor or Stool  Able to sit safely and securely 2 minutes    Stand to Sit  Sits safely with minimal use of hands    Transfers  Able to transfer safely, minor use of hands    Standing Unsupported with Eyes Closed  Able to stand 10 seconds safely    Standing Ubsupported with Feet Together  Able to place feet together independently and stand 1 minute safely    From Standing, Reach Forward with Outstretched Arm  Can reach forward >12 cm safely (5")   RUE   From Standing Position, Pick up Object from Floor  Able to pick up shoe safely and easily    From Standing Position, Turn to Look Behind Over each Shoulder  Looks behind from both sides and weight shifts  well    Turn 360 Degrees  Able to turn 360 degrees safely one side only in 4 seconds or less    Standing Unsupported, Alternately Place Feet on Step/Stool  Able to complete 4 steps without aid or supervision   needed S on step 7.  Standing Unsupported, One Foot in Front  Able to plae foot ahead of the other independently and hold 30 seconds   unable to perform tandem   Standing on One Leg  Tries to lift leg/unable to hold 3 seconds but remains standing independently   attempted on R and L LEs   Total Score  48            Self Care: PT Education - 10/19/18 1356    Education Details  Pt reported she's not walking outdoors 2/2 weather. PT educated pt to try walking in the mall, pt reported she'd like to join gym with pool and track indoors. PT educated pt on goal progress and outcome measure results. PT encouraged pt to discuss feelings of frustration, as pt became tearful several times during session and required rest break.    Person(s) Educated  Patient    Methods  Explanation    Comprehension  Verbalized understanding       PT Short Term Goals - 10/19/18 1656      PT SHORT TERM GOAL #1   Title  Pt will be IND with HEP to improve deficits listed above. TARGET DATE FOR ALL STGS: 11/16/18    Status  New      PT SHORT TERM GOAL #2   Title  Pt will begin aquatic PT to improve strength and balance and QOL.     Status  New      PT SHORT TERM GOAL #3   Title  Pt will perform floor<>stand txfs with S, with UE support to improve safety.    Status  New        PT Long Term Goals - 10/19/18 1653      PT LONG TERM GOAL #1   Title  patient to be independent with advanced HEP for balance. NEW POC FOR ALL UNMET LTGS: 12/14/18    Time  4    Period  Weeks    Status  On-going      PT LONG TERM GOAL #2   Title  patient to improve Berg to >/=54/56 demonstrating reduced fall risk    Baseline  48/56 on 10/19/18    Time  4    Period  Weeks    Status  On-going      PT LONG TERM GOAL #3    Title  patient to improve FGA by >/= 26/30 demonstrating improved functional mobility and reduced fall risk    Baseline  21/30 on 10/19/18    Time  4    Period  Weeks    Status  On-going      PT LONG TERM GOAL #4   Title  patient to demonstrate Floor <> stand transfers with Mod I without need for UE support on chair/object    Time  4    Period  Weeks    Status  On-going      PT LONG TERM GOAL #5   Title  patient to demosntrate gait over various even and uneven surfaces as well as up/down ramps and inclines for >/= 500 feet with Ind with no LOB    Time  4    Period  Weeks    Status  On-going      PT LONG TERM GOAL #6   Title  patient to demonstrate participation in gym/walking program most days of the week    Time  4    Period  Weeks    Status  On-going  Plan - 10/19/18 1649    Clinical Impression Statement  Pt demonstrated progress, as partially met LTGs 2 and 3. Pt did not meet LTG 6. PT will assess LTGs 1, 4 and 5 next session, limited by time constraints as pt became tearful during session. Pt's BERG and FGA scores have improved but continue to indicate pt is at a moderate risk for falls. Therefore, pt would continue to benefit from skilled PT to improve safety during functional mobility. PT requesting additional 2x/week for 8 weeks.     Rehab Potential  Good    PT Frequency  2x / week    PT Duration  8 weeks    PT Treatment/Interventions  ADLs/Self Care Home Management;Gait training;Stair training;Functional mobility training;Therapeutic activities;Therapeutic exercise;Balance training;Patient/family education;Neuromuscular re-education;Manual techniques;Taping;Visual/perceptual remediation/compensation;Aquatic Therapy    PT Next Visit Plan  Check with [facility] re: aquatic PT. Delay goals by one week? high level balance on rockerboard, negotiating over obstacles on compliant surfaces, cognition challenges.     PT Home Exercise Plan  [id number]     Consulted and  Agree with Plan of Care  Patient       Patient will benefit from skilled therapeutic intervention in order to improve the following deficits and impairments:  Decreased balance, Decreased activity tolerance, Decreased mobility, Decreased strength  Visit Diagnosis: Other abnormalities of gait and mobility - Plan: PT plan of care cert/re-cert  Unsteadiness on feet - Plan: PT plan of care cert/re-cert  Muscle weakness (generalized) - Plan: PT plan of care cert/re-cert     Problem List Patient Active Problem List   Diagnosis Date Noted  . Raynaud disease 10/06/2018  . Encounter for monitoring long-term proton pump inhibitor therapy 08/28/2018  . White matter disease 02/20/2018  . Osteoporosis   . Hypercalcemia 01/17/2018  . Essential hypertension 01/16/2018  . Hypothyroidism 01/16/2018  . Depression with anxiety 01/16/2018  . Traumatic brain injury with loss of consciousness ([facility]) 01/16/2018  . Hyperlipidemia     Miller,Jennifer L 10/19/2018, 5:00 PM  [facility] [address] [place], [place], [address] Phone: [phone]   Fax:  [phone]  Name: Abigail Michael MRN: [id number] Date of Birth: 12/13/1950  Geoffry Paradise, PT,DPT 10/19/18 5:06 PM Phone: [phone] Fax: [phone]

## 2018-10-19 NOTE — Therapy (Signed)
[facility] [address] [place], [place], [address] Phone: [phone]   Fax:  [phone]  Speech Language Pathology Treatment & Discharge Summary  Patient Details  Name: Abigail Michael MRN: [id number] Date of Birth: January 02, 1951 Referring Provider (SLP): Dr. Sharlene Dory   Encounter Date: 10/19/2018  End of Session - 10/19/18 1446    Visit Number  10    Number of Visits  17    Date for SLP Re-Evaluation  10/20/18    SLP Start Time  77    SLP Stop Time   1440    SLP Time Calculation (min)  40 min    Activity Tolerance  Patient tolerated treatment well       Past Medical History:  Diagnosis Date  . 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 Chicken pox   . Constipation   . Depression   . Frequent headaches   . GERD (gastroesophageal reflux disease)   . 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 Hyperlipidemia   . Hypertension   . MS (multiple sclerosis) ([facility]) 12/15/2016   possible dx. many MRI w/ positive white matter abnormality. MRI reported stable since 2015. mild progressive  Cognitive loss  . Obstructive sleep apnea    on prior neuro notes 10/2016; no results.   . Osteoporosis    had been on reclast  . Post concussion syndrome 10/2013   ongoing issues with concentration, memory loss, lability; EEG completed and normal.   . TBI (traumatic brain injury) ([place]) 2014   fall at [facility], hit head off of floor. No bleed, "concussion". Seen neurology since for focus and word finding.   Marland Kitchen TIA (transient ischemic attack) 11/30/2016   MRI normal. No lateralization. Speech affected and weakness during a hypotensive episdoe.   . Urinary incontinence     Past Surgical History:  Procedure Laterality Date  . BACK SURGERY  02/2015   herniated disc L4/5/6  . BREAST BIOPSY  1988   biopsy  . LAPAROSCOPIC HYSTERECTOMY     partial   . SHOULDER ARTHROSCOPY W/ LABRAL REPAIR Left  02/13/2016   left shoulder  . Study: cognitive eval  06/23/2016   mild cog-linguistic impairment, difficulty with verbal fluency  . Study: EEG   04/12/2015   Studies: EEG completed and normal.   . Study: MRI  04/28/2016   Impression: numerous non-enhancing flair hyperintense lesions w/in the brain .Suspicious for demyelinating disease such as lyme or MS. Her Lyme test were negative.     There were no vitals filed for this visit.  Subjective Assessment - 10/19/18 1351    Subjective  I'm good - I'm a little over whelmed"    Currently in Pain?  No/denies            ADULT SLP TREATMENT - 10/19/18 1404      General Information   Behavior/Cognition  Alert;Cooperative;Pleasant mood      Treatment Provided   Treatment provided  Cognitive-Linquistic      Cognitive-Linquistic Treatment   Treatment focused on  Cognition    Skilled Treatment  Pt reports utilizing strategies of lists, getting items on her list first before browsing, and  taking breaks when she senses brain fatigue. Pt advocated for her self my lettng her daughter's surgeon know she had a brain injury. The surgeon video recorded the information and the surgery and recovery to assist Abigail Michael in recall and processing of the procedure.  Assessment / Recommendations / Plan   Plan  Discharge SLP treatment due to (comment)   meeting goals     Progression Toward Goals   Progression toward goals  Goals met, education completed, patient discharged from [facility] Education - 10/19/18 1441    Education Details  no distractions when reading, use index card or book mark to A with attention/focus;     Person(s) Educated  Patient    Methods  Explanation;Handout;Verbal cues    Comprehension  Verbalized understanding      SPEECH THERAPY DISCHARGE SUMMARY  Visits from Start of Care: 10 -10th visit Reporting period 08/22/18 to 10/19/18  Current functional level related to goals / functional outcomes: See goals below    Remaining deficits: Attention, slow processing; sensory processing   Education / Equipment: Compensations for attention, processing, memory and sensory processing; medication and calendar management Plan: Patient agrees to discharge.  Patient goals were met. Patient is being discharged due to meeting the stated rehab goals.  ?????         SLP Short Term Goals - 10/19/18 1445      SLP SHORT TERM GOAL #1   Title  Pt will utilize portable external memory/organization aids for high levl ADL's such as shopping and bill paying with rare min A over 2 sessions.    Baseline  09/05/18; 09/07/18    Time  3    Period  Weeks    Status  Achieved      SLP SHORT TERM GOAL #2   Title  Pt and family will carryover compensations for higher level executive function tasks with occasional min A over 2 sessions (pre-plan trips, shopping, errands)     Baseline  09/07/18; 09/19/18    Time  3    Period  Weeks    Status  Achieved      SLP SHORT TERM GOAL #3   Title  Formal naming assessment to be completed as indicated    Time  2    Period  Weeks    Status  Achieved      SLP SHORT TERM GOAL #4   Title  Pt will utilize compensations to process/recall what she has read with rare min A over 2 sessions.    Time  1    Period  Weeks    Status  Partially Met       SLP Long Term Goals - 10/19/18 1445      SLP LONG TERM GOAL #1   Title  Pt will use external aids to plan her day and complete high level ADL's with supervision cues over 3 sessions    Baseline  09/26/18; 10/06/11; 10/19/18    Time  5    Period  Weeks    Status  Achieved      SLP LONG TERM GOAL #2   Title  Pt will run 2 errands in a row using compensations with occasional min A over 2 sessions.     Baseline  09/26/18;10/05/18    Time  5    Period  Weeks    Status  Achieved      SLP LONG TERM GOAL #3   Title  Pt will complete high level executive function tasks with use of compensations, 85% accuracy and rare min A over 2 sessions     Baseline  09/26/18' 10/05/18    Time  5    Period  Weeks    Status  Achieved  SLP LONG TERM GOAL #4   Title  Pt will retrieve 3 items from a store in less than 20 minutes with rare min A over 2 sessions.    Baseline  09/26/18 10/19/18    Time  5    Period  Weeks    Status  Achieved       Plan - 10/19/18 1441    Clinical Impression Statement  Pt reports continued success running multiple errands, retriving items from the store and organizing/planning meals and calendar with excellent carryover of compensatory strategies for attention, memory and sensory processing. Pt has met goals and is pleased with her progress. D/C ST at this time, pt in agreement.    Speech Therapy Frequency  1x /week    Duration  --   8 weeks or 17 visits   Treatment/Interventions  Cognitive reorganization;Environmental controls;Compensatory techniques;Cueing hierarchy;Internal/external aids;Functional tasks;SLP instruction and feedback;Patient/family education;Compensatory strategies    Potential to Achieve Goals  Good       Patient will benefit from skilled therapeutic intervention in order to improve the following deficits and impairments:   Cognitive communication deficit    Problem List Patient Active Problem List   Diagnosis Date Noted  . Raynaud disease 10/06/2018  . Encounter for monitoring long-term proton pump inhibitor therapy 08/28/2018  . White matter disease 02/20/2018  . Osteoporosis   . Hypercalcemia 01/17/2018  . Essential hypertension 01/16/2018  . Hypothyroidism 01/16/2018  . Depression with anxiety 01/16/2018  . Traumatic brain injury with loss of consciousness ([place]) 01/16/2018  . Hyperlipidemia     Lovvorn, Annye Rusk MS, CCC-SLP 10/19/2018, 2:48 PM  [facility] [address] [place], [place], [address] Phone: [phone]   Fax:  [phone]   Name: Abigail Michael MRN: [id number] Date of Birth: 1951/06/04

## 2018-10-24 ENCOUNTER — Encounter: Payer: Self-pay | Admitting: Speech Pathology

## 2018-10-26 ENCOUNTER — Encounter: Payer: Self-pay | Admitting: Speech Pathology

## 2018-10-27 ENCOUNTER — Ambulatory Visit: Payer: Medicare Other

## 2018-10-27 DIAGNOSIS — R2681 Unsteadiness on feet: Secondary | ICD-10-CM

## 2018-10-27 DIAGNOSIS — R2689 Other abnormalities of gait and mobility: Secondary | ICD-10-CM

## 2018-10-27 DIAGNOSIS — R41841 Cognitive communication deficit: Secondary | ICD-10-CM | POA: Diagnosis not present

## 2018-10-27 DIAGNOSIS — M6281 Muscle weakness (generalized): Secondary | ICD-10-CM

## 2018-10-27 NOTE — Therapy (Signed)
[facility] [address] [place], [place], [address] Phone: [phone]   Fax:  [phone]  Physical Therapy Treatment  Patient Details  Name: Abigail Michael MRN: [id number] Date of Birth: 1951/05/07 Referring Provider (PT): Dr. Raoul Pitch   Encounter Date: 10/27/2018  PT End of Session - 10/27/18 1319    Visit Number  7    Number of Visits  22    Date for PT Re-Evaluation  --   original POC: 10/11/18   Authorization Type  Medicare + BCBS    PT Start Time  1315    PT Stop Time  1402    PT Time Calculation (min)  47 min    Equipment Utilized During Treatment  Gait belt    Activity Tolerance  Patient tolerated treatment well    Behavior During Therapy  WFL for tasks assessed/performed   with some bouts of tearfulness 2/2 memory and balance issues      Past Medical History:  Diagnosis Date  . 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 Chicken pox   . Constipation   . Depression   . Frequent headaches   . GERD (gastroesophageal reflux disease)   . 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 Hyperlipidemia   . Hypertension   . MS (multiple sclerosis) ([place]) 12/15/2016   possible dx. many MRI w/ positive white matter abnormality. MRI reported stable since 2015. mild progressive  Cognitive loss  . Obstructive sleep apnea    on prior neuro notes 10/2016; no results.   . Osteoporosis    had been on reclast  . Post concussion syndrome 10/2013   ongoing issues with concentration, memory loss, lability; EEG completed and normal.   . TBI (traumatic brain injury) ([place]) 2014   fall at [facility], hit head off of floor. No bleed, "concussion". Seen neurology since for focus and word finding.   Marland Kitchen TIA (transient ischemic attack) 11/30/2016   MRI normal. No lateralization. Speech affected and weakness during a hypotensive episdoe.   . Urinary incontinence     Past Surgical History:  Procedure  Laterality Date  . BACK SURGERY  02/2015   herniated disc L4/5/6  . BREAST BIOPSY  1988   biopsy  . LAPAROSCOPIC HYSTERECTOMY     partial   . SHOULDER ARTHROSCOPY W/ LABRAL REPAIR Left 02/13/2016   left shoulder  . Study: cognitive eval  06/23/2016   mild cog-linguistic impairment, difficulty with verbal fluency  . Study: EEG   04/12/2015   Studies: EEG completed and normal.   . Study: MRI  04/28/2016   Impression: numerous non-enhancing flair hyperintense lesions w/in the brain .Suspicious for demyelinating disease such as lyme or MS. Her Lyme test were negative.     There were no vitals filed for this visit.  Subjective Assessment - 10/27/18 1318    Subjective  No falls to report, HEP is going well.     Pertinent History  depression, headaches, HTN, MS (possible dx), OSA, post-consussion syndrome, TBI, TIA    Patient Stated Goals  improve balance, feel more steady.    Currently in Pain?  No/denies        [facility] Adult PT Treatment/Exercise - 10/27/18 1321      Transfers   Transfers  Floor to Transfer    Floor to Transfer  5: Supervision    Floor to Transfer Details (indicate cue type and reason)  Pt able to transfer from  red mat on floor with/without BUE support min guard/supervision.       Ambulation/Gait   Ambulation/Gait  Yes    Ambulation/Gait Assistance  5: Supervision;4: Min guard    Ambulation/Gait Assistance Details  Gait with no AD while negotiating ramp with blue mat, various sized stepping stones and red mat with objects under to challange stability during ambulation, pt presented with min/mod ankle instability    Ambulation Distance (Feet)  230 Feet    Assistive device  None    Ambulation Surface  Level;Indoor      Knee/Hip Exercises: Aerobic   Nustep  L2 72mins         PT Education - 10/27/18 1521    Education Details  Provided pt with RTB with instruction on use while performing HEP.     Person(s) Educated  Patient    Methods   Explanation;Demonstration;Verbal cues;Tactile cues    Comprehension  Verbalized understanding;Returned demonstration;Verbal cues required;Tactile cues required;Need further instruction       PT Short Term Goals - 10/19/18 1656      PT SHORT TERM GOAL #1   Title  Pt will be IND with HEP to improve deficits listed above. TARGET DATE FOR ALL STGS: 11/16/18    Status  New      PT SHORT TERM GOAL #2   Title  Pt will begin aquatic PT to improve strength and balance and QOL.     Status  New      PT SHORT TERM GOAL #3   Title  Pt will perform floor<>stand txfs with S, with UE support to improve safety.    Status  New        PT Long Term Goals - 10/19/18 1653      PT LONG TERM GOAL #1   Title  patient to be independent with advanced HEP for balance. NEW POC FOR ALL UNMET LTGS: 12/14/18    Time  4    Period  Weeks    Status  On-going      PT LONG TERM GOAL #2   Title  patient to improve Berg to >/=54/56 demonstrating reduced fall risk    Baseline  48/56 on 10/19/18    Time  4    Period  Weeks    Status  On-going      PT LONG TERM GOAL #3   Title  patient to improve FGA by >/= 26/30 demonstrating improved functional mobility and reduced fall risk    Baseline  21/30 on 10/19/18    Time  4    Period  Weeks    Status  On-going      PT LONG TERM GOAL #4   Title  patient to demonstrate Floor <> stand transfers with Mod I without need for UE support on chair/object    Time  4    Period  Weeks    Status  On-going      PT LONG TERM GOAL #5   Title  patient to demosntrate gait over various even and uneven surfaces as well as up/down ramps and inclines for >/= 500 feet with Ind with no LOB    Time  4    Period  Weeks    Status  On-going      PT LONG TERM GOAL #6   Title  patient to demonstrate participation in gym/walking program most days of the week    Time  4    Period  Weeks    Status  On-going       Plan - 10/27/18 1522    Clinical Impression Statement  Todays skilled  session focused on gait training with no AD while negotiating various obstacles with min/mod instability, BLE strengthening on Nustep, and floor to standing transfer with progression from BUE to no UE support required. Pt is progressing well with energy conservation strategies. Pt should benefit from continued PT sessions to progress towards goals.     Rehab Potential  Good    PT Frequency  2x / week    PT Duration  8 weeks    PT Treatment/Interventions  ADLs/Self Care Home Management;Gait training;Stair training;Functional mobility training;Therapeutic activities;Therapeutic exercise;Balance training;Patient/family education;Neuromuscular re-education;Manual techniques;Taping;Visual/perceptual remediation/compensation;Aquatic Therapy    PT Next Visit Plan  Check with [facility] Level re: aquatic PT. Delay goals by one week? high level balance on rockerboard, negotiating over obstacles on compliant surfaces, cognition challenges.     PT Home Exercise Plan  [id number]     Consulted and Agree with Plan of Care  Patient       Patient will benefit from skilled therapeutic intervention in order to improve the following deficits and impairments:  Decreased balance, Decreased activity tolerance, Decreased mobility, Decreased strength  Visit Diagnosis: Other abnormalities of gait and mobility  Unsteadiness on feet  Muscle weakness (generalized)     Problem List Patient Active Problem List   Diagnosis Date Noted  . Raynaud disease 10/06/2018  . Encounter for monitoring long-term proton pump inhibitor therapy 08/28/2018  . White matter disease 02/20/2018  . Osteoporosis   . Hypercalcemia 01/17/2018  . Essential hypertension 01/16/2018  . Hypothyroidism 01/16/2018  . Depression with anxiety 01/16/2018  . Traumatic brain injury with loss of consciousness ([place]) 01/16/2018  . Hyperlipidemia    Linsi Humann, PTA  Jhoana Upham A Prabhleen Montemayor 10/27/2018, 3:29 PM  [facility] [address] [place], [place], [address] Phone: [phone]   Fax:  [phone]  Name: Savannaha Stonerock MRN: [id number] Date of Birth: 1951/10/07

## 2018-10-31 ENCOUNTER — Ambulatory Visit: Payer: Self-pay

## 2018-10-31 ENCOUNTER — Ambulatory Visit: Payer: Medicare Other | Admitting: Physical Therapy

## 2018-10-31 ENCOUNTER — Ambulatory Visit: Payer: Medicare Other

## 2018-10-31 ENCOUNTER — Encounter: Payer: Self-pay | Admitting: Speech Pathology

## 2018-10-31 DIAGNOSIS — R41841 Cognitive communication deficit: Secondary | ICD-10-CM | POA: Diagnosis not present

## 2018-10-31 DIAGNOSIS — R2689 Other abnormalities of gait and mobility: Secondary | ICD-10-CM

## 2018-10-31 DIAGNOSIS — R2681 Unsteadiness on feet: Secondary | ICD-10-CM

## 2018-10-31 DIAGNOSIS — M6281 Muscle weakness (generalized): Secondary | ICD-10-CM | POA: Diagnosis not present

## 2018-11-01 NOTE — Patient Instructions (Signed)
Hamstring: Stretch (Sitting)    Sit straight. Extend right knee until stretch is felt in back of thigh. Hold _30__ seconds. Relax. Repeat _1__ times. Do _2__ times a day. Repeat with other leg. Advanced: Lean trunk forward.  PLACE leg on object in front of you for support.    Gastroc / Heel Cord Stretch - On Step    Stand with heels over edge of stair. Holding rail, lower heels until stretch is felt in calf of legs. Repeat _2__ times. Do _2__ times per day.  HOLD 30-60 secs       Place one leg forward, bent, other leg behind and straight. Lean forward keeping back heel flat. Hold _30-60___ seconds while counting out loud. Repeat with other leg. Repeat __1-2__ times. Do _2_ sessions per day.  [email or web address]

## 2018-11-01 NOTE — Therapy (Signed)
[facility] [address] [place], [place], [address] Phone: [phone]   Fax:  [phone]  Physical Therapy Treatment  Patient Details  Name: Abigail Michael MRN: [id number] Date of Birth: 11/13/1950 Referring Provider (PT): Dr. Raoul Pitch   Encounter Date: 10/31/2018  PT End of Session - 11/01/18 1109    Visit Number  8    Number of Visits  22    Authorization Type  Medicare + BCBS    PT Start Time  0626    PT Stop Time  1148    PT Time Calculation (min)  46 min       Past Medical History:  Diagnosis Date  . 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 Chicken pox   . Constipation   . Depression   . Frequent headaches   . GERD (gastroesophageal reflux disease)   . 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 Hyperlipidemia   . Hypertension   . MS (multiple sclerosis) ([place]) 12/15/2016   possible dx. many MRI w/ positive white matter abnormality. MRI reported stable since 2015. mild progressive  Cognitive loss  . Obstructive sleep apnea    on prior neuro notes 10/2016; no results.   . Osteoporosis    had been on reclast  . Post concussion syndrome 10/2013   ongoing issues with concentration, memory loss, lability; EEG completed and normal.   . TBI (traumatic brain injury) ([place]) 2014   fall at [facility], hit head off of floor. No bleed, "concussion". Seen neurology since for focus and word finding.   Marland Kitchen TIA (transient ischemic attack) 11/30/2016   MRI normal. No lateralization. Speech affected and weakness during a hypotensive episdoe.   . Urinary incontinence     Past Surgical History:  Procedure Laterality Date  . BACK SURGERY  02/2015   herniated disc L4/5/6  . BREAST BIOPSY  1988   biopsy  . LAPAROSCOPIC HYSTERECTOMY     partial   . SHOULDER ARTHROSCOPY W/ LABRAL REPAIR Left 02/13/2016   left shoulder  . Study: cognitive eval  06/23/2016   mild cog-linguistic impairment,  difficulty with verbal fluency  . Study: EEG   04/12/2015   Studies: EEG completed and normal.   . Study: MRI  04/28/2016   Impression: numerous non-enhancing flair hyperintense lesions w/in the brain .Suspicious for demyelinating disease such as lyme or MS. Her Lyme test were negative.     There were no vitals filed for this visit.  Subjective Assessment - 11/01/18 1055    Subjective  Pt states she was recently diagnosed with MS (a couple of weeks ago) by neurologist at [facility] system; states both of her ankles are very weak, balance is not good    Pertinent History  depression, headaches, HTN, MS (possible dx), OSA, post-consussion syndrome, TBI, TIA    Patient Stated Goals  improve balance, feel more steady.    Currently in Pain?  No/denies                       [facility] Adult PT Treatment/Exercise - 11/01/18 0001      Exercises   Exercises  Knee/Hip;Ankle      Knee/Hip Exercises: Stretches   Active Hamstring Stretch  Both;1 rep;30 seconds   LLE tighter than RLE ; Runner's stretch in standing   Other Knee/Hip Stretches  seated hamstring stretch - 30 sec hold each leg; contact relax added for increased  intensity of stretch      Knee/Hip Exercises: Aerobic   Recumbent Bike  SciFit level 2.5 x 5" with UE's and LE's      Ankle Exercises: Stretches   Gastroc Stretch  1 rep;30 seconds   bil. LE's - bottom shelf of cabinet used     Pt performed hip abduction in sidelying - bil. LE's - 10 reps with knee extended; then performed circles clockwise and counterclockwise 5 reps each direction with bil. LE's  Pt performed hip extension in quadruped position 3 reps each leg due to c/o fatigue:  Pt then performed lifting opposite UE with LE for attempt of 5 sec hold - 3 reps each side  Squats in tall kneeling x 10 reps with cues to squeeze gluts together in return to upright position Stretch (child's pose position) - arms straight out in front and then moved hands over on left  side for 10 sec hold, then to right side for 10 sec hold   NeuroRe-ed;  Sit to stand on blue Airex from mat without UE support - 5 reps for balance and strengthening Marching in place on Airex pad with CGA Alternate tap downs to floor while standing on pad - 10 reps each leg;  Lt foot caught on edge of Airex pad x 1 occurrence with min assist given for recovery of LOB    Balance Exercises - 11/01/18 1059      Balance Exercises: Standing   Rockerboard  Anterior/posterior;10 reps;UE support   inside // bars   Other Standing Exercises  Pt performed amb. on tip toes reps (2 laps) along cabinet for UE support prn; amb. on heels 2 laps along cabinet for UE support prn for ankle strengthening and for improved balance     Pt performed sideways amb. On tip toes along counter 2 reps for ankle strengthening and for improved balance  Pt reported legs and ankles fatigued after completion of these standing balance exercises   PT Education - 11/01/18 1104    Education Details  educated pt in hamstring and heel cord stretch bil. LE's for HEP    Person(s) Educated  Patient    Methods  Explanation;Demonstration;Handout    Comprehension  Verbalized understanding;Returned demonstration       PT Short Term Goals - 10/19/18 1656      PT SHORT TERM GOAL #1   Title  Pt will be IND with HEP to improve deficits listed above. TARGET DATE FOR ALL STGS: 11/16/18    Status  New      PT SHORT TERM GOAL #2   Title  Pt will begin aquatic PT to improve strength and balance and QOL.     Status  New      PT SHORT TERM GOAL #3   Title  Pt will perform floor<>stand txfs with S, with UE support to improve safety.    Status  New        PT Long Term Goals - 10/19/18 1653      PT LONG TERM GOAL #1   Title  patient to be independent with advanced HEP for balance. NEW POC FOR ALL UNMET LTGS: 12/14/18    Time  4    Period  Weeks    Status  On-going      PT LONG TERM GOAL #2   Title  patient to improve Berg  to >/=54/56 demonstrating reduced fall risk    Baseline  48/56 on 10/19/18    Time  4  Period  Weeks    Status  On-going      PT LONG TERM GOAL #3   Title  patient to improve FGA by >/= 26/30 demonstrating improved functional mobility and reduced fall risk    Baseline  21/30 on 10/19/18    Time  4    Period  Weeks    Status  On-going      PT LONG TERM GOAL #4   Title  patient to demonstrate Floor <> stand transfers with Mod I without need for UE support on chair/object    Time  4    Period  Weeks    Status  On-going      PT LONG TERM GOAL #5   Title  patient to demosntrate gait over various even and uneven surfaces as well as up/down ramps and inclines for >/= 500 feet with Ind with no LOB    Time  4    Period  Weeks    Status  On-going      PT LONG TERM GOAL #6   Title  patient to demonstrate participation in gym/walking program most days of the week    Time  4    Period  Weeks    Status  On-going              Patient will benefit from skilled therapeutic intervention in order to improve the following deficits and impairments:     Visit Diagnosis: Other abnormalities of gait and mobility  Unsteadiness on feet  Muscle weakness (generalized)     Problem List Patient Active Problem List   Diagnosis Date Noted  . Raynaud disease 10/06/2018  . Encounter for monitoring long-term proton pump inhibitor therapy 08/28/2018  . White matter disease 02/20/2018  . Osteoporosis   . Hypercalcemia 01/17/2018  . Essential hypertension 01/16/2018  . Hypothyroidism 01/16/2018  . Depression with anxiety 01/16/2018  . Traumatic brain injury with loss of consciousness ([place]) 01/16/2018  . Hyperlipidemia     Haili Donofrio, [place], PT 11/01/2018, 11:15 AM  [facility] [address] [place], [place], [address] Phone: [phone]   Fax:  [phone]  Name: Hatsumi Steinhart MRN: [id number] Date of Birth:  1950-11-18

## 2018-11-02 ENCOUNTER — Encounter: Payer: Self-pay | Admitting: Speech Pathology

## 2018-11-02 ENCOUNTER — Ambulatory Visit: Payer: Self-pay

## 2018-11-02 DIAGNOSIS — R9082 White matter disease, unspecified: Secondary | ICD-10-CM | POA: Diagnosis not present

## 2018-11-02 DIAGNOSIS — M47812 Spondylosis without myelopathy or radiculopathy, cervical region: Secondary | ICD-10-CM | POA: Diagnosis not present

## 2018-11-03 ENCOUNTER — Ambulatory Visit: Payer: Self-pay

## 2018-11-03 ENCOUNTER — Ambulatory Visit: Payer: Medicare Other

## 2018-11-03 DIAGNOSIS — M6281 Muscle weakness (generalized): Secondary | ICD-10-CM | POA: Diagnosis not present

## 2018-11-03 DIAGNOSIS — R41841 Cognitive communication deficit: Secondary | ICD-10-CM

## 2018-11-03 DIAGNOSIS — R2689 Other abnormalities of gait and mobility: Secondary | ICD-10-CM | POA: Diagnosis not present

## 2018-11-03 DIAGNOSIS — R2681 Unsteadiness on feet: Secondary | ICD-10-CM

## 2018-11-03 NOTE — Therapy (Signed)
[facility] [address] [place], [place], [address] Phone: [phone]   Fax:  [phone]  Physical Therapy Treatment  Patient Details  Name: Abigail Michael MRN: [id number] Date of Birth: [birth year] Referring Provider (PT): Dr. Raoul Pitch   Encounter Date: 11/03/2018  PT End of Session - 11/03/18 1308    Visit Number  9    Number of Visits  22    Authorization Type  Medicare + BCBS    PT Start Time  1062    PT Stop Time  1230    PT Time Calculation (min)  45 min    Equipment Utilized During Treatment  Gait belt    Activity Tolerance  Patient tolerated treatment well    Behavior During Therapy  WFL for tasks assessed/performed       Past Medical History:  Diagnosis Date  . 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 Chicken pox   . Constipation   . Depression   . Frequent headaches   . GERD (gastroesophageal reflux disease)   . 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 Hyperlipidemia   . Hypertension   . MS (multiple sclerosis) ([place]) 12/15/2016   possible dx. many MRI w/ positive white matter abnormality. MRI reported stable since 2015. mild progressive  Cognitive loss  . Obstructive sleep apnea    on prior neuro notes 10/2016; no results.   . Osteoporosis    had been on reclast  . Post concussion syndrome 10/2013   ongoing issues with concentration, memory loss, lability; EEG completed and normal.   . TBI (traumatic brain injury) ([place]) 2014   fall at [facility], hit head off of floor. No bleed, "concussion". Seen neurology since for focus and word finding.   Marland Kitchen TIA (transient ischemic attack) 11/30/2016   MRI normal. No lateralization. Speech affected and weakness during a hypotensive episdoe.   . Urinary incontinence     Past Surgical History:  Procedure Laterality Date  . BACK SURGERY  02/2015   herniated disc L4/5/6  . BREAST BIOPSY  1988   biopsy  . LAPAROSCOPIC  HYSTERECTOMY     partial   . SHOULDER ARTHROSCOPY W/ LABRAL REPAIR Left 02/13/2016   left shoulder  . Study: cognitive eval  06/23/2016   mild cog-linguistic impairment, difficulty with verbal fluency  . Study: EEG   04/12/2015   Studies: EEG completed and normal.   . Study: MRI  04/28/2016   Impression: numerous non-enhancing flair hyperintense lesions w/in the brain .Suspicious for demyelinating disease such as lyme or MS. Her Lyme test were negative.     There were no vitals filed for this visit.  Subjective Assessment - 11/03/18 1151    Subjective  Pt reports feeling off today, drove to [place] yesterday for MRI and noticed her foot continued to slip off of the gas pedal due to fatigue. Tuesday pt has spinal tap and lab work to be done in [place] as well. Pt is still trying to decide if [facility] or [facility] due to distance.     Pertinent History  depression, headaches, HTN, MS (possible dx), OSA, post-consussion syndrome, TBI, TIA    Patient Stated Goals  improve balance, feel more steady.    Currently in Pain?  No/denies        [facility] Adult PT Treatment/Exercise - 11/03/18 1304      Ambulation/Gait  Ambulation/Gait  Yes    Ambulation/Gait Assistance  5: Supervision;4: Min guard    Ambulation/Gait Assistance Details  Pt ambulated outdoors for 1000' with supervision for safety, no LOB noted. Gait indoors with cognitive challenge with cues for forward gaze and pacing, min/mod deviation from path throughout.     Ambulation Distance (Feet)  1000 Feet   860   Assistive device  None    Ambulation Surface  Level;Unlevel;Indoor;Outdoor;Paved      Ankle Exercises: Aerobic   Nustep  L2 17mins        PT Education - 11/03/18 1307    Education Details  Educated pt on energy conservation.     Person(s) Educated  Patient    Methods  Explanation    Comprehension  Verbalized understanding       PT Short Term Goals - 10/19/18 1656      PT  SHORT TERM GOAL #1   Title  Pt will be IND with HEP to improve deficits listed above. TARGET DATE FOR ALL STGS: 11/16/18    Status  New      PT SHORT TERM GOAL #2   Title  Pt will begin aquatic PT to improve strength and balance and QOL.     Status  New      PT SHORT TERM GOAL #3   Title  Pt will perform floor<>stand txfs with S, with UE support to improve safety.    Status  New        PT Long Term Goals - 10/19/18 1653      PT LONG TERM GOAL #1   Title  patient to be independent with advanced HEP for balance. NEW POC FOR ALL UNMET LTGS: 12/14/18    Time  4    Period  Weeks    Status  On-going      PT LONG TERM GOAL #2   Title  patient to improve Berg to >/=54/56 demonstrating reduced fall risk    Baseline  48/56 on 10/19/18    Time  4    Period  Weeks    Status  On-going      PT LONG TERM GOAL #3   Title  patient to improve FGA by >/= 26/30 demonstrating improved functional mobility and reduced fall risk    Baseline  21/30 on 10/19/18    Time  4    Period  Weeks    Status  On-going      PT LONG TERM GOAL #4   Title  patient to demonstrate Floor <> stand transfers with Mod I without need for UE support on chair/object    Time  4    Period  Weeks    Status  On-going      PT LONG TERM GOAL #5   Title  patient to demosntrate gait over various even and uneven surfaces as well as up/down ramps and inclines for >/= 500 feet with Ind with no LOB    Time  4    Period  Weeks    Status  On-going      PT LONG TERM GOAL #6   Title  patient to demonstrate participation in gym/walking program most days of the week    Time  4    Period  Weeks    Status  On-going         Plan - 11/03/18 [place] skilled session focused on gait training indoors/outdoors for increased distances with cognitive  challenge, BLE strenthening and education on energy conservation. Pt emotional this session due to diagnosis and being able to continue daily life tasks.  Pt should benefit from continued PT sessions to progress towards goals.     Rehab Potential  Good    PT Frequency  2x / week    PT Duration  8 weeks    PT Treatment/Interventions  ADLs/Self Care Home Management;Gait training;Stair training;Functional mobility training;Therapeutic activities;Therapeutic exercise;Balance training;Patient/family education;Neuromuscular re-education;Manual techniques;Taping;Visual/perceptual remediation/compensation;Aquatic Therapy    PT Next Visit Plan  Check with [facility] Level re: aquatic PT. Delay goals by one week? high level balance on rockerboard, negotiating over obstacles on compliant surfaces, cognition challenges.     PT Home Exercise Plan  [id number]     Consulted and Agree with Plan of Care  Patient       Patient will benefit from skilled therapeutic intervention in order to improve the following deficits and impairments:  Decreased balance, Decreased activity tolerance, Decreased mobility, Decreased strength  Visit Diagnosis: Other abnormalities of gait and mobility  Unsteadiness on feet  Muscle weakness (generalized)  Cognitive communication deficit     Problem List Patient Active Problem List   Diagnosis Date Noted  . Raynaud disease 10/06/2018  . Encounter for monitoring long-term proton pump inhibitor therapy 08/28/2018  . White matter disease 02/20/2018  . Osteoporosis   . Hypercalcemia 01/17/2018  . Essential hypertension 01/16/2018  . Hypothyroidism 01/16/2018  . Depression with anxiety 01/16/2018  . Traumatic brain injury with loss of consciousness ([place]) 01/16/2018  . Hyperlipidemia    Chassity Felts, PTA  Chassity A Felts 11/03/2018, 1:13 PM  [facility] [address] [place], [place], [address] Phone: [phone]   Fax:  [phone]  Name: Abigail Michael MRN: [id number] Date of Birth: [birth year]

## 2018-11-07 DIAGNOSIS — R9082 White matter disease, unspecified: Secondary | ICD-10-CM | POA: Diagnosis not present

## 2018-11-07 DIAGNOSIS — R419 Unspecified symptoms and signs involving cognitive functions and awareness: Secondary | ICD-10-CM | POA: Diagnosis not present

## 2018-11-09 ENCOUNTER — Ambulatory Visit: Payer: Self-pay

## 2018-11-10 ENCOUNTER — Ambulatory Visit: Payer: Self-pay

## 2018-11-14 ENCOUNTER — Ambulatory Visit: Payer: Medicare Other

## 2018-11-14 DIAGNOSIS — R2681 Unsteadiness on feet: Secondary | ICD-10-CM

## 2018-11-14 DIAGNOSIS — R41841 Cognitive communication deficit: Secondary | ICD-10-CM

## 2018-11-14 DIAGNOSIS — R2689 Other abnormalities of gait and mobility: Secondary | ICD-10-CM

## 2018-11-14 DIAGNOSIS — M6281 Muscle weakness (generalized): Secondary | ICD-10-CM | POA: Diagnosis not present

## 2018-11-14 NOTE — Patient Instructions (Signed)
  Aquatic Therapy: What to Expect!  Where:  [facility] [address] [place], [place]  [address] [phone]  NOTE:  You will receive an automated phone message reminding you of your appointment and it will say the appointment is at the Rehab Center on [address]  We are working to fix this- just know that you will meet us at the pool!  How to Prepare: . Please make sure you drink 8 ounces of water about one hour prior to your pool session . A caregiver MUST attend the entire session with the patient.  The caregiver will be responsible for assisting with dressing as well as any toileting needs.  If the patient will be doing a home program this should likely be the person who will assist as well.  . Patients must wear either their street shoes or pool shoes until they are ready to enter the pool with the therapist.  Patients must also wear either street shoes or pool shoes once exiting the pool to walk to the locker room.  This will helps us prevent slips and falls.  . Please arrive 15 minutes early to prepare for your pool therapy session . Sign in at the front desk on the clipboard marked for [place] . You may use the locker rooms on your right and then enter directly into the recreation pool (NOT the competition pool) . Please make sure to attend to any toileting needs prior to entering the pool . Please be dressed in your swim suit and on the pool deck at least 5 minutes before your appointment . Once on the pool deck your therapist will ask you to sign the Patient  Consent and Assignment of Benefits form . Your therapist may take your blood pressure prior to, during and after your session if indicated  About the pool: 1. Entering the pool Your therapist will assist you; there are multiple ways to enter including stairs with railings, a walk in ramp, a roll in chair and a mechanical lift. Your therapist will determine the most appropriate way for you. 2. Water  temperature is usually between 86-87 degrees 3. There may be other swimmers in the pool at the same time   Contact Info:     Appointments: [facility]  All sessions are 45 minutes   [address]  [address]     Please call the [facility] if   [place], [place]   [address]    you need to cancel or reschedule an appointment.  [phone]       

## 2018-11-15 NOTE — Therapy (Addendum)
[facility] [address] [place], [place], [address] Phone: [phone]   Fax:  [phone]  Physical Therapy Treatment and Progress Note  Patient Details  Name: Abigail Michael MRN: [id number] Date of Birth: 1950-11-27 Referring Provider (PT): Dr. Raoul Pitch   Encounter Date: 11/14/2018  PT End of Session - 11/14/18 1322    Visit Number  10    Number of Visits  22    Authorization Type  Medicare + BCBS    PT Start Time  3893    PT Stop Time  1400    PT Time Calculation (min)  45 min    Equipment Utilized During Treatment  Gait belt    Activity Tolerance  Patient tolerated treatment well    Behavior During Therapy  [facility] for tasks assessed/performed       Past Medical History:  Diagnosis Date  . 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 Chicken pox   . Constipation   . Depression   . Frequent headaches   . GERD (gastroesophageal reflux disease)   . 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 Hyperlipidemia   . Hypertension   . MS (multiple sclerosis) ([place]) 12/15/2016   possible dx. many MRI w/ positive white matter abnormality. MRI reported stable since 2015. mild progressive  Cognitive loss  . Obstructive sleep apnea    on prior neuro notes 10/2016; no results.   . Osteoporosis    had been on reclast  . Post concussion syndrome 10/2013   ongoing issues with concentration, memory loss, lability; EEG completed and normal.   . TBI (traumatic brain injury) ([place]) 2014   fall at [facility], hit head off of floor. No bleed, "concussion". Seen neurology since for focus and word finding.   Marland Kitchen TIA (transient ischemic attack) 11/30/2016   MRI normal. No lateralization. Speech affected and weakness during a hypotensive episdoe.   . Urinary incontinence     Past Surgical History:  Procedure Laterality Date  . BACK SURGERY  02/2015   herniated disc L4/5/6  . BREAST BIOPSY  1988   biopsy  .  LAPAROSCOPIC HYSTERECTOMY     partial   . SHOULDER ARTHROSCOPY W/ LABRAL REPAIR Left 02/13/2016   left shoulder  . Study: cognitive eval  06/23/2016   mild cog-linguistic impairment, difficulty with verbal fluency  . Study: EEG   04/12/2015   Studies: EEG completed and normal.   . Study: MRI  04/28/2016   Impression: numerous non-enhancing flair hyperintense lesions w/in the brain .Suspicious for demyelinating disease such as lyme or MS. Her Lyme test were negative.     There were no vitals filed for this visit.  Subjective Assessment - 11/14/18 1318    Subjective  Pt reports spinal tap went great and she is having a great week. Pt has appt Feb 7th at [facility] to go over lab work to set up a plan going further. Pt reports not doing HEP as much due to busy week and wanted to take it easy.     Pertinent History  depression, headaches, HTN, MS (possible dx), OSA, post-consussion syndrome, TBI, TIA    Patient Stated Goals  improve balance, feel more steady.    Currently in Pain?  No/denies        [facility] Adult PT Treatment/Exercise - 11/14/18 1344      Ambulation/Gait   Ambulation/Gait  Yes    Ambulation/Gait Assistance  5: Supervision  Ambulation/Gait Assistance Details  Pt ambulating outdoors while negotiating grassy hill ascending/descending sideways taking small steps for safety, no LOB noted, curbs/ramps with no LOB noted, minimal fatigue and appropriate pacing throughout ambulation.     Ambulation Distance (Feet)  1000 Feet    Assistive device  None    Ambulation Surface  Level;Indoor    Stairs  Yes    Stairs Assistance  5: Supervision    Stairs Assistance Details (indicate cue type and reason)  Pt required cues for posture and controlled descent while descending steps.     Stair Management Technique  One rail Right;Alternating pattern;Forwards    Number of Stairs  4   x5   Height of Stairs  6      Neuro Re-ed    Neuro Re-ed Details   Pt in parallel bars performing SLS on  airex with SUE support on airex, mini squats on airex with no UE support required.         PT Education - 11/14/18 1401    Education Details  Education on aquatic therapy provided.     Person(s) Educated  Patient    Methods  Explanation;Handout    Comprehension  Verbalized understanding       PT Short Term Goals - 11/14/18 1323      PT SHORT TERM GOAL #1   Title  Pt will be IND with HEP to improve deficits listed above. TARGET DATE FOR ALL STGS: 11/16/18    Baseline  1/28: Pt is IND with HEP.     Status  Achieved      PT SHORT TERM GOAL #2   Title  Pt will begin aquatic PT to improve strength and balance and QOL.     Baseline  1/28: Pt has confirmed appt for aquatic PT.     Status  Achieved      PT SHORT TERM GOAL #3   Title  Pt will perform floor<>stand txfs with S, with UE support to improve safety.    Baseline  1/28: Pt able to perform floor<> stand with S for safety and SUE support.     Status  Achieved        PT Long Term Goals - 10/19/18 1653      PT LONG TERM GOAL #1   Title  patient to be independent with advanced HEP for balance. NEW POC FOR ALL UNMET LTGS: 12/14/18    Time  4    Period  Weeks    Status  On-going      PT LONG TERM GOAL #2   Title  patient to improve Berg to >/=54/56 demonstrating reduced fall risk    Baseline  48/56 on 10/19/18    Time  4    Period  Weeks    Status  On-going      PT LONG TERM GOAL #3   Title  patient to improve FGA by >/= 26/30 demonstrating improved functional mobility and reduced fall risk    Baseline  21/30 on 10/19/18    Time  4    Period  Weeks    Status  On-going      PT LONG TERM GOAL #4   Title  patient to demonstrate Floor <> stand transfers with Mod I without need for UE support on chair/object    Time  4    Period  Weeks    Status  On-going      PT LONG TERM GOAL #5   Title  patient  to demosntrate gait over various even and uneven surfaces as well as up/down ramps and inclines for >/= 500 feet with Ind with  no LOB    Time  4    Period  Weeks    Status  On-going      PT LONG TERM GOAL #6   Title  patient to demonstrate participation in gym/walking program most days of the week    Time  4    Period  Weeks    Status  On-going         Progress Note Reporting Period 09/06/19 to 11/14/18  See note below for Objective Data and Assessment of Progress/Goals.  See STG section above for goal assessment in this session.   Progress note completed by:  Cameron Sprang, PT, MPT [facility] [address] [place], [place], [address] Phone: [phone]   Fax:  [phone] 12/22/18, 2:42 PM         Plan - 11/14/18 1544    Clinical Impression Statement  Todays skilled session focused on gait training outdoors while negotiating grassy hill, curbs, ramps with proper pacing and minimal fatigue, high level balance and assessing STG's. Pt achieved all STG's this session and has scheduled aquatic PT appt. Pt should benefit from continued PT sessions to progress towards goals.     Rehab Potential  Good    PT Frequency  2x / week    PT Duration  8 weeks    PT Treatment/Interventions  ADLs/Self Care Home Management;Gait training;Stair training;Functional mobility training;Therapeutic activities;Therapeutic exercise;Balance training;Patient/family education;Neuromuscular re-education;Manual techniques;Taping;Visual/perceptual remediation/compensation;Aquatic Therapy    PT Next Visit Plan  Delay goals by one week? high level balance on rockerboard, negotiating over obstacles on compliant surfaces, cognition challenges.     PT Home Exercise Plan  [email or web address]     Consulted and Agree with Plan of Care  Patient       Patient will benefit from skilled therapeutic intervention in order to improve the following deficits and impairments:  Decreased balance, Decreased activity tolerance, Decreased mobility, Decreased strength  Visit Diagnosis: Other abnormalities of gait  and mobility  Unsteadiness on feet  Muscle weakness (generalized)  Cognitive communication deficit     Problem List Patient Active Problem List   Diagnosis Date Noted  . Raynaud disease 10/06/2018  . Encounter for monitoring long-term proton pump inhibitor therapy 08/28/2018  . White matter disease 02/20/2018  . Osteoporosis   . Hypercalcemia 01/17/2018  . Essential hypertension 01/16/2018  . Hypothyroidism 01/16/2018  . Depression with anxiety 01/16/2018  . Traumatic brain injury with loss of consciousness ([place]) 01/16/2018  . Hyperlipidemia    Chassity Felts, PTA  Chassity A Felts 11/15/2018, 3:31 PM  [facility] [address] [place], [place], [address] Phone: [phone]   Fax:  [phone]  Name: Abigail Michael MRN: [id number] Date of Birth: 11-11-1950

## 2018-11-16 ENCOUNTER — Ambulatory Visit: Payer: Medicare Other

## 2018-11-16 DIAGNOSIS — M6281 Muscle weakness (generalized): Secondary | ICD-10-CM

## 2018-11-16 DIAGNOSIS — R2681 Unsteadiness on feet: Secondary | ICD-10-CM | POA: Diagnosis not present

## 2018-11-16 DIAGNOSIS — R2689 Other abnormalities of gait and mobility: Secondary | ICD-10-CM

## 2018-11-16 DIAGNOSIS — R41841 Cognitive communication deficit: Secondary | ICD-10-CM | POA: Diagnosis not present

## 2018-11-16 NOTE — Therapy (Signed)
[facility] [address] [place], [place], [address] Phone: [phone]   Fax:  [phone]  Physical Therapy Treatment  Patient Details  Name: Abigail Michael MRN: [id number] Date of Birth: Apr 27, 1951 Referring Provider (PT): Dr. Raoul Pitch   Encounter Date: 11/16/2018  PT End of Session - 11/16/18 1207    Visit Number  11    Number of Visits  22    Date for PT Re-Evaluation  12/18/18    Authorization Type  Medicare + BCBS    PT Start Time  0931    PT Stop Time  1019    PT Time Calculation (min)  48 min    Equipment Utilized During Treatment  --   min guard to S prn   Activity Tolerance  Patient tolerated treatment well    Behavior During Therapy  [facility] for tasks assessed/performed       Past Medical History:  Diagnosis Date  . 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 Chicken pox   . Constipation   . Depression   . Frequent headaches   . GERD (gastroesophageal reflux disease)   . 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 Hyperlipidemia   . Hypertension   . MS (multiple sclerosis) ([place]) 12/15/2016   possible dx. many MRI w/ positive white matter abnormality. MRI reported stable since 2015. mild progressive  Cognitive loss  . Obstructive sleep apnea    on prior neuro notes 10/2016; no results.   . Osteoporosis    had been on reclast  . Post concussion syndrome 10/2013   ongoing issues with concentration, memory loss, lability; EEG completed and normal.   . TBI (traumatic brain injury) ([place]) 2014   fall at [facility], hit head off of floor. No bleed, "concussion". Seen neurology since for focus and word finding.   Marland Kitchen TIA (transient ischemic attack) 11/30/2016   MRI normal. No lateralization. Speech affected and weakness during a hypotensive episdoe.   . Urinary incontinence     Past Surgical History:  Procedure Laterality Date  . BACK SURGERY  02/2015   herniated disc L4/5/6  .  BREAST BIOPSY  1988   biopsy  . LAPAROSCOPIC HYSTERECTOMY     partial   . SHOULDER ARTHROSCOPY W/ LABRAL REPAIR Left 02/13/2016   left shoulder  . Study: cognitive eval  06/23/2016   mild cog-linguistic impairment, difficulty with verbal fluency  . Study: EEG   04/12/2015   Studies: EEG completed and normal.   . Study: MRI  04/28/2016   Impression: numerous non-enhancing flair hyperintense lesions w/in the brain .Suspicious for demyelinating disease such as lyme or MS. Her Lyme test were negative.     There were no vitals filed for this visit.  Subjective Assessment - 11/16/18 0934    Subjective  Pt denied falls or changes since last visit. She begins aquatic therapy on 11/22/18. Pt reported her legs aren't aching as much, as she's using a pillow between her legs while sleeping. Pt had MRI and spinal tap since last visit, she'll get the results on 11/24/18. Pt hasn't visited [place] website yet.     Pertinent History  depression, headaches, HTN, MS (possible dx), OSA, post-consussion syndrome, TBI, TIA    Patient Stated Goals  improve balance, feel more steady.    Currently in Pain?  Yes    Pain Score  2     Pain Location  Back  Pain Orientation  Lower    Pain Descriptors / Indicators  Aching    Pain Type  Chronic pain    Pain Onset  More than a month ago    Pain Frequency  Intermittent   but more constant than intermittent   Aggravating Factors   unsure but reported she's gained 30 lbs. in one year    Pain Relieving Factors  stretches in the morning         [facility] PT Assessment - 11/16/18 0945      Functional Gait  Assessment   Gait assessed   Yes    Gait Level Surface  Walks 20 ft in less than 7 sec but greater than 5.5 sec, uses assistive device, slower speed, mild gait deviations, or deviates 6-10 in outside of the 12 in walkway width.   6.17 sec.    Change in Gait Speed  Able to change speed, demonstrates mild gait deviations, deviates 6-10 in outside of the 12 in  walkway width, or no gait deviations, unable to achieve a major change in velocity, or uses a change in velocity, or uses an assistive device.    Gait with Horizontal Head Turns  Performs head turns smoothly with slight change in gait velocity (eg, minor disruption to smooth gait path), deviates 6-10 in outside 12 in walkway width, or uses an assistive device.   pt reported unsteadiness   Gait with Vertical Head Turns  Performs head turns with no change in gait. Deviates no more than 6 in outside 12 in walkway width.    Gait and Pivot Turn  Pivot turns safely within 3 sec and stops quickly with no loss of balance.   pt reported unsteadiness   Step Over Obstacle  Is able to step over 2 stacked shoe boxes taped together (9 in total height) without changing gait speed. No evidence of imbalance.    Gait with Narrow Base of Support  Ambulates 7-9 steps.   9 steps   Gait with Eyes Closed  Cannot walk 20 ft without assistance, severe gait deviations or imbalance, deviates greater than 15 in outside 12 in walkway width or will not attempt task.   > 15" outside walkway   Ambulating Backwards  Walks 20 ft, uses assistive device, slower speed, mild gait deviations, deviates 6-10 in outside 12 in walkway width.    Steps  Alternating feet, no rail.    Total Score  22    FGA comment:  22/30: indicates pt is at a medium risk for falls         [place]:  Access Code: [email or web address]  URL: [email or web address]  Date: 11/16/2018  Prepared by: Geoffry Paradise   Exercises  Clamshell - 10 reps - 3 sets - 2 hold - 1x daily - 3x weekly with red theraband Squat with Chair Support - 10 reps - 2 sets - 2 hold - 1x daily - 3x weekly (REVIEW) Sit to Stand on Foam Pad - 10 reps - 1 sets - 1x daily - 5x weekly  (REVIEW) Standing Soleus Stretch - 3 reps - 2-3 sets - 30-60 hold - 1x daily - 7x weekly    Cues and demo for technique. No pain incr. During session.                Self Care: PT  Education - 11/16/18 1206    Education Details  PT reviewed and progressed HEP as indicated. PT discussed again checking out the [place] website as pt  reported she's going through different emotions (working her way towards acceptance of new MS Dx).     Person(s) Educated  Patient    Methods  Explanation;Handout;Demonstration;Verbal cues    Comprehension  Verbalized understanding;Returned demonstration       PT Short Term Goals - 11/14/18 1323      PT SHORT TERM GOAL #1   Title  Pt will be IND with HEP to improve deficits listed above. TARGET DATE FOR ALL STGS: 11/16/18    Baseline  1/28: Pt is IND with HEP.     Status  Achieved      PT SHORT TERM GOAL #2   Title  Pt will begin aquatic PT to improve strength and balance and QOL.     Baseline  1/28: Pt has confirmed appt for aquatic PT.     Status  Achieved      PT SHORT TERM GOAL #3   Title  Pt will perform floor<>stand txfs with S, with UE support to improve safety.    Baseline  1/28: Pt able to perform floor<> stand with S for safety and SUE support.     Status  Achieved        PT Long Term Goals - 10/19/18 1653      PT LONG TERM GOAL #1   Title  patient to be independent with advanced HEP for balance. NEW POC FOR ALL UNMET LTGS: 12/14/18    Time  4    Period  Weeks    Status  On-going      PT LONG TERM GOAL #2   Title  patient to improve Berg to >/=54/56 demonstrating reduced fall risk    Baseline  48/56 on 10/19/18    Time  4    Period  Weeks    Status  On-going      PT LONG TERM GOAL #3   Title  patient to improve FGA by >/= 26/30 demonstrating improved functional mobility and reduced fall risk    Baseline  21/30 on 10/19/18    Time  4    Period  Weeks    Status  On-going      PT LONG TERM GOAL #4   Title  patient to demonstrate Floor <> stand transfers with Mod I without need for UE support on chair/object    Time  4    Period  Weeks    Status  On-going      PT LONG TERM GOAL #5   Title  patient to  demosntrate gait over various even and uneven surfaces as well as up/down ramps and inclines for >/= 500 feet with Ind with no LOB    Time  4    Period  Weeks    Status  On-going      PT LONG TERM GOAL #6   Title  patient to demonstrate participation in gym/walking program most days of the week    Time  4    Period  Weeks    Status  On-going            Plan - 11/16/18 0951    Clinical Impression Statement  Pt demonstrated progress, as her FGA score improved by one point but continues to indicate pt is at medium risk for falls. Pt continues to experience incr. postural sway, unsteadiness, and gait deviations during activities which require incr. vestibular input. Therefore, pt would benefit from performing incr. actvities which challenge vestibular system. PT updated/progress and added HEP to address  B gastroc/soleus spasms and to maintain gains. Continue with POC.     Rehab Potential  Good    PT Frequency  2x / week    PT Duration  8 weeks    PT Treatment/Interventions  ADLs/Self Care Home Management;Gait training;Stair training;Functional mobility training;Therapeutic activities;Therapeutic exercise;Balance training;Patient/family education;Neuromuscular re-education;Manual techniques;Taping;Visual/perceptual remediation/compensation;Aquatic Therapy    PT Next Visit Plan   high level balance on rockerboard, negotiating over obstacles on compliant surfaces, cognition challenges.     PT Home Exercise Plan  [email or web address]     Consulted and Agree with Plan of Care  Patient       Patient will benefit from skilled therapeutic intervention in order to improve the following deficits and impairments:  Decreased balance, Decreased activity tolerance, Decreased mobility, Decreased strength  Visit Diagnosis: Other abnormalities of gait and mobility  Unsteadiness on feet  Muscle weakness (generalized)     Problem List Patient Active Problem List   Diagnosis Date Noted  . Raynaud disease  10/06/2018  . Encounter for monitoring long-term proton pump inhibitor therapy 08/28/2018  . White matter disease 02/20/2018  . Osteoporosis   . Hypercalcemia 01/17/2018  . Essential hypertension 01/16/2018  . Hypothyroidism 01/16/2018  . Depression with anxiety 01/16/2018  . Traumatic brain injury with loss of consciousness ([place]) 01/16/2018  . Hyperlipidemia     Wayburn Shaler L 11/16/2018, 12:13 PM  [facility] [address] [place], [place], [address] Phone: [phone]   Fax:  [phone]  Name: Abigail Michael MRN: [id number] Date of Birth: 01/17/1951  Geoffry Paradise, PT,DPT 11/16/18 12:14 PM Phone: [phone] Fax: [phone]

## 2018-11-16 NOTE — Patient Instructions (Signed)
Access Code: [email or web address]  URL: [email or web address]  Date: 11/16/2018  Prepared by: Geoffry Paradise   Exercises  Clamshell - 10 reps - 3 sets - 2 hold - 1x daily - 3x weekly with red theraband Squat with Chair Support - 10 reps - 2 sets - 2 hold - 1x daily - 3x weekly (REVIEWED ONLY) Sit to Stand on Foam Pad - 10 reps - 1 sets - 1x daily - 5x weekly  (REVIEWED ONLY) Standing Soleus Stretch - 3 reps - 2-3 sets - 30-60 hold - 1x daily - 7x weekly

## 2018-11-20 ENCOUNTER — Encounter: Payer: Self-pay | Admitting: Family Medicine

## 2018-11-20 ENCOUNTER — Ambulatory Visit (INDEPENDENT_AMBULATORY_CARE_PROVIDER_SITE_OTHER): Payer: Medicare Other | Admitting: Family Medicine

## 2018-11-20 VITALS — BP 119/70 | HR 56 | Temp 98.1°F | Resp 16 | Ht 61.0 in | Wt 171.2 lb

## 2018-11-20 DIAGNOSIS — K2270 Barrett's esophagus without dysplasia: Secondary | ICD-10-CM | POA: Diagnosis not present

## 2018-11-20 DIAGNOSIS — Z5181 Encounter for therapeutic drug level monitoring: Secondary | ICD-10-CM

## 2018-11-20 DIAGNOSIS — Z79899 Other long term (current) drug therapy: Secondary | ICD-10-CM

## 2018-11-20 DIAGNOSIS — K219 Gastro-esophageal reflux disease without esophagitis: Secondary | ICD-10-CM | POA: Diagnosis not present

## 2018-11-20 MED ORDER — ESOMEPRAZOLE MAGNESIUM 40 MG PO CPDR: 40.0000 mg | DELAYED_RELEASE_CAPSULE | Freq: Every day | ORAL | 1 refills | Status: DC

## 2018-11-20 NOTE — Progress Notes (Signed)
Patient ID: Abigail Michael, female  DOB: 1950-12-19, 67 y.o.   MRN: [id number] Patient Care Team    Relationship Specialty Notifications Start End  Ma Hillock, DO PCP - General Family Medicine  01/16/18   Sharlene Dory, MD Referring Physician Neurology  08/28/18     Chief Complaint  Patient presents with  . Gastroesophageal Reflux    Pt has tried medications for acid reflux and has had no success. Pt states the only thing helping is eating constantly.     Subjective:  Abigail Michael is a 68 y.o.  female present for GERD:  Pt reports her GERD symptoms are worsening. She has been taking the pepcid twice a day, in place of the zantac since it was recalled. She has not been taking the nexium. She reports she thought the pepcid replaced nexium also. In the past she had been on dexilant 60 mg and zantac high dose.  She feels her reflux is all day discomfort that is only improved with eating. She endorses some nausea, but not vomiting or bowel changes. She is eating and drinking well. She has a h/o barrett's esophagus, incompetent lower esophageal sphincter,  and GERD, with hiatal hernia by review of her 01/20/2015 EGD completed with her prior GI before moving to [place].  She also has been formerly diagnosed with MS- allow this has been a suspicion for some time by her prior neurology notes.   Depression screen [facility] 2/9 08/28/2018 02/17/2018 01/16/2018  Decreased Interest 0 0 2  Down, Depressed, Hopeless 0 0 2  PHQ - 2 Score 0 0 4  Altered sleeping 0 0 2  Tired, decreased energy 0 0 -  Change in appetite 0 0 1  Feeling bad or failure about yourself  0 0 2  Trouble concentrating 2 0 2  Moving slowly or fidgety/restless 0 0 2  Suicidal thoughts 0 0 0  PHQ-9 Score 2 0 13  Difficult doing work/chores Somewhat difficult - -   GAD 7 : Generalized Anxiety Score 08/28/2018 01/16/2018  Nervous, Anxious, on Edge 2 1  Control/stop worrying 2 2  Worry too much - different things 2 2  Trouble relaxing  0 2  Restless 0 2  Easily annoyed or irritable 2 2  Afraid - awful might happen 0 2  Total GAD 7 Score 8 13  Anxiety Difficulty Somewhat difficult -       Fall Risk  08/28/2018 01/16/2018  Falls in the past year? 0 Yes  Comment - Jan 2016  Number falls in past yr: 0 1  Injury with Fall? 0 Yes  Risk for fall due to : Impaired balance/gait;Mental status change History of fall(s)  Follow up Falls evaluation completed;Falls prevention discussed Follow up appointment    Immunization History  Administered Date(s) Administered  . Influenza, High Dose Seasonal PF 08/28/2018  . Pneumococcal Polysaccharide-23 08/28/2018    No exam data present  Past Medical History:  Diagnosis Date  . 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 Chicken pox   . Constipation   . Depression   . Frequent headaches   . GERD (gastroesophageal reflux disease)   . 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 Hyperlipidemia   . Hypertension   . MS (multiple sclerosis) ([place]) 12/15/2016   possible dx. many MRI w/ positive white matter abnormality. MRI reported stable since 2015. mild progressive  Cognitive loss  . Obstructive sleep apnea  on prior neuro notes 10/2016; no results.   . Osteoporosis    had been on reclast  . Post concussion syndrome 10/2013   ongoing issues with concentration, memory loss, lability; EEG completed and normal.   . TBI (traumatic brain injury) ([place]) 2014   fall at [facility], hit head off of floor. No bleed, "concussion". Seen neurology since for focus and word finding.   Marland Kitchen TIA (transient ischemic attack) 11/30/2016   MRI normal. No lateralization. Speech affected and weakness during a hypotensive episdoe.   . Urinary incontinence    Allergies  Allergen Reactions  . Bee Venom Anaphylaxis  . Amoxicillin Dermatitis    Yeast infection   Past Surgical History:  Procedure Laterality Date  . BACK SURGERY  02/2015   herniated disc L4/5/6  . BREAST  BIOPSY  1988   biopsy  . LAPAROSCOPIC HYSTERECTOMY     partial   . SHOULDER ARTHROSCOPY W/ LABRAL REPAIR Left 02/13/2016   left shoulder  . Study: cognitive eval  06/23/2016   mild cog-linguistic impairment, difficulty with verbal fluency  . Study: EEG   04/12/2015   Studies: EEG completed and normal.   . Study: MRI  04/28/2016   Impression: numerous non-enhancing flair hyperintense lesions w/in the brain .Suspicious for demyelinating disease such as lyme or MS. Her Lyme test were negative.    Family History  Problem Relation Age of Onset  . Lung cancer Mother   . Alcohol abuse Father   . Throat cancer Father   . Lung cancer Father   . Depression Father   . Heart attack Father   . Breast cancer Sister   . Alcohol abuse Brother   . Diabetes Brother   . Mental illness Sister   . Stroke Daughter    Social History   Socioeconomic History  . Marital status: Single    Spouse name: Not on file  . Number of children: Not on file  . Years of education: Not on file  . Highest education level: Not on file  Occupational History  . Occupation: retired  Scientific laboratory technician  . Financial resource strain: Not on file  . Food insecurity:    Worry: Not on file    Inability: Not on file  . Transportation needs:    Medical: Not on file    Non-medical: Not on file  Tobacco Use  . Smoking status: Former Smoker    Packs/day: 1.00    Years: 19.00    Pack years: 19.00    Last attempt to quit: 2012    Years since quitting: 8.0  . Smokeless tobacco: Never Used  . Tobacco comment: quit 6 years ago  Substance and Sexual Activity  . Alcohol use: Yes    Alcohol/week: 1.0 standard drinks    Types: 1 Glasses of wine per week    Comment: twice a month  . Drug use: Never  . Sexual activity: Not Currently    Partners: Male  Lifestyle  . Physical activity:    Days per week: Not on file    Minutes per session: Not on file  . Stress: Not on file  Relationships  . Social connections:    Talks on  phone: Not on file    Gets together: Not on file    Attends religious service: Not on file    Active member of club or organization: Not on file    Attends meetings of clubs or organizations: Not on file    Relationship status: Not  on file  . Intimate partner violence:    Fear of current or ex partner: Not on file    Emotionally abused: Not on file    Physically abused: Not on file    Forced sexual activity: Not on file  Other Topics Concern  . Not on file  Social History Narrative   Single. Lived in [place] area, moved to [place] to be close to her daughters 2018. Lives with her daughter.   Some college, worked as Programmer, systems. Retired.    Social drinker. Former smoker.   Exercises routinely.    Wears dentures.    Drinks caffeine, takes a daily vitamin.   Smoke alarm in the home. Wears her seatbelt.    Feels safe in her relationships.    Allergies as of 11/20/2018      Reactions   Bee Venom Anaphylaxis   Amoxicillin Dermatitis   Yeast infection      Medication List       Accurate as of November 20, 2018 11:43 AM. Always use your most recent med list.        amLODipine 10 MG tablet Commonly known as:  NORVASC Take 1 tablet (10 mg total) by mouth daily.   aspirin EC 81 MG tablet Take 81 mg by mouth daily.   atorvastatin 40 MG tablet Commonly known as:  LIPITOR Take 1 tablet (40 mg total) by mouth daily.   donepezil 5 MG tablet Commonly known as:  ARICEPT Take 5 mg by mouth at bedtime.   EPINEPHrine 0.3 mg/0.3 mL Soaj injection Commonly known as:  EPI-PEN Inject 0.3 mg into the skin as needed.   esomeprazole 40 MG capsule Commonly known as:  NEXIUM Take 1 capsule (40 mg total) by mouth daily at 12 noon.   famotidine 20 MG tablet Commonly known as:  PEPCID Take 1 tablet (20 mg total) by mouth 2 (two) times daily.   FLUoxetine 40 MG capsule Commonly known as:  PROZAC Take 1 capsule (40 mg total) by mouth daily.   levothyroxine 25 MCG tablet Commonly known as:   SYNTHROID, LEVOTHROID Take 0.5 tablets (12.5 mcg total) by mouth daily before breakfast.   Magnesium 500 MG Tabs Take by mouth.   TURMERIC PO Take by mouth.       All past medical history, surgical history, allergies, family history, immunizations andmedications were updated in the EMR today and reviewed under the history and medication portions of their EMR.     Recent Results (from the past 2160 hour(s))  Comp Met (CMET)     Status: Abnormal   Collection Time: 08/28/18 10:55 AM  Result Value Ref Range   Sodium 140 135 - 145 mEq/L   Potassium 4.1 3.5 - 5.1 mEq/L   Chloride 105 96 - 112 mEq/L   CO2 30 19 - 32 mEq/L   Glucose, Bld 102 (H) 70 - 99 mg/dL   BUN 16 6 - 23 mg/dL   Creatinine, Ser 1.04 0.40 - 1.20 mg/dL   Total Bilirubin 0.6 0.2 - 1.2 mg/dL   Alkaline Phosphatase 107 39 - 117 U/L   AST 17 0 - 37 U/L   ALT 25 0 - 35 U/L   Total Protein 6.7 6.0 - 8.3 g/dL   Albumin 4.4 3.5 - 5.2 g/dL   Calcium 10.6 (H) 8.4 - 10.5 mg/dL   GFR 56.03 (L) >60.00 mL/min  HgB A1c     Status: None   Collection Time: 08/28/18 10:55 AM  Result Value Ref  Range   Hgb A1c MFr Bld 5.6 4.6 - 6.5 %    Comment: Glycemic Control Guidelines for People with Diabetes:Non Diabetic:  <6%Goal of Therapy: <7%Additional Action Suggested:  >8%   Vitamin D (25 hydroxy)     Status: None   Collection Time: 08/28/18 10:55 AM  Result Value Ref Range   VITD 35.97 30.00 - 100.00 ng/mL  B12     Status: None   Collection Time: 08/28/18 10:55 AM  Result Value Ref Range   Vitamin B-12 392 211 - 911 pg/mL  Magnesium     Status: None   Collection Time: 08/28/18 10:55 AM  Result Value Ref Range   Magnesium 2.2 1.5 - 2.5 mg/dL  Lipid panel     Status: None   Collection Time: 08/28/18 10:55 AM  Result Value Ref Range   Cholesterol 151 0 - 200 mg/dL    Comment: ATP III Classification       Desirable:  < 200 mg/dL               Borderline High:  200 - 239 mg/dL          High:  > = 240 mg/dL   Triglycerides 83.0  0.0 - 149.0 mg/dL    Comment: Normal:  <150 mg/dLBorderline High:  150 - 199 mg/dL   HDL 71.80 >39.00 mg/dL   VLDL 16.6 0.0 - 40.0 mg/dL   LDL Cholesterol 62 0 - 99 mg/dL   Total CHOL/HDL Ratio 2     Comment:                Men          Women1/2 Average Risk     3.4          3.3Average Risk          5.0          4.42X Average Risk          9.6          7.13X Average Risk          15.0          11.0                       NonHDL 78.92     Comment: NOTE:  Non-HDL goal should be 30 mg/dL higher than patient's LDL goal (i.e. LDL goal of < 70 mg/dL, would have non-HDL goal of < 100 mg/dL)  CBC w/Diff     Status: None   Collection Time: 08/28/18 10:55 AM  Result Value Ref Range   WBC 6.2 4.0 - 10.5 K/uL   RBC 5.05 3.87 - 5.11 Mil/uL   Hemoglobin 14.7 12.0 - 15.0 g/dL   HCT 43.2 36.0 - 46.0 %   MCV 85.7 78.0 - 100.0 fl   MCHC 34.0 30.0 - 36.0 g/dL   RDW 12.9 11.5 - 15.5 %   Platelets 204.0 150.0 - 400.0 K/uL   Neutrophils Relative % 73.2 43.0 - 77.0 %   Lymphocytes Relative 16.2 12.0 - 46.0 %   Monocytes Relative 6.9 3.0 - 12.0 %   Eosinophils Relative 3.2 0.0 - 5.0 %   Basophils Relative 0.5 0.0 - 3.0 %   Neutro Abs 4.5 1.4 - 7.7 K/uL   Lymphs Abs 1.0 0.7 - 4.0 K/uL   Monocytes Absolute 0.4 0.1 - 1.0 K/uL   Eosinophils Absolute 0.2 0.0 - 0.7 K/uL   Basophils Absolute 0.0 0.0 -  0.1 K/uL  HIV Antibody (routine testing w rflx)     Status: None   Collection Time: 09/25/18 12:00 AM  Result Value Ref Range   HIV 1&2 Ab, 4th Generation negative   VITAMIN D 25 Hydroxy (Vit-D Deficiency, Fractures)     Status: None   Collection Time: 09/25/18 12:00 AM  Result Value Ref Range   Vit D, 25-Hydroxy 49.1   Basic metabolic panel     Status: None   Collection Time: 09/25/18 12:00 AM  Result Value Ref Range   Glucose 102    Potassium 4.3 3.4 - 5.3  Lab report - scanned     Status: None   Collection Time: 09/25/18 12:00 AM  Result Value Ref Range   Antinuclear antibodies negative    Rheumatoid  Factor (IgA) <8.6    LYME AB IGG BY WB: Negative    HIV Nonreactive    RPR Nonreactive    Thyroid Peroxidase (TPO) Ab 0.04     Patient was never admitted.   ROS: 14 pt review of systems performed and negative (unless mentioned in an HPI)  Objective: BP 119/70 (BP Location: Right Arm, Patient Position: Sitting, Cuff Size: Normal)   Pulse (!) 56   Temp 98.1 F (36.7 C) (Oral)   Resp 16   Ht '5\' 1"'  (1.549 m)   Wt 171 lb 4 oz (77.7 kg)   LMP 10/18/1990   SpO2 100%   BMI 32.36 kg/m  Gen: Afebrile. No acute distress. Nontoxic.  HENT: AT. [place].  MMM.  Eyes:Pupils Equal Round Reactive to light, Extraocular movements intact,  Conjunctiva without redness, discharge or icterus. Neck/lymp/endocrine: Supple,no lymphadenopathy, no thyromegaly CV: RRR no murmur, no edema, +2/4 P posterior tibialis pulses Chest: CTAB, no wheeze or crackles Abd: Soft. NTND. BS present. no Masses palpated.  Skin: no rashes, purpura or petechiae.  Neuro: Normal gait today. PERLA. EOMi. Alert. Oriented.  Psych: Normal affect, dress and demeanor. Normal speech. Normal thought content and judgment.  Assessment/plan: Abigail Michael is a 68 y.o. female present for establishment.  Encounter for monitoring long-term proton pump inhibitor therapy/GERD - restart nexium at 40 mg QD.  - continue pepcid at 20 mg BID.  - referral to GI- pt request Dr. Collene Mares- given prior h/o barrett's, LE incompetence and worsening GI symptoms. Last EGD 01/20/2015. - F/U needed here only if worsening prior to est with GI   Note is dictated utilizing voice recognition software. Although note has been proof read prior to signing, occasional typographical errors still can be missed. If any questions arise, please do not hesitate to call for verification.  Electronically signed by: Howard Pouch, DO [place]

## 2018-11-20 NOTE — Patient Instructions (Addendum)
Restart Nexium 40 mg a day.  Continue Pepcid twice a day.   I have also referred you to a local gastroenterologist to get established and follow for your reflux since you had an abnormal EGD in 2016.   Barrett's Esophagus  Barrett's esophagus occurs when the tissue that lines the esophagus changes or becomes damaged. The esophagus is the tube that carries food from the throat to the stomach. With Barrett's esophagus, the cells that line the esophagus are replaced by cells that are similar to the lining of the intestines (intestinal metaplasia). Barrett's esophagus itself may not cause any symptoms. However, many people who have Barrett's esophagus also have gastroesophageal reflux disease (GERD), which may cause symptoms such as heartburn. Over time, a few people with this condition may develop cancer of the esophagus. Treatment may include medicines, procedures to destroy the abnormal cells, or surgery. What are the causes? The exact cause of this condition is not known. In some cases, the condition develops from damage to the lining of the esophagus caused by GERD. GERD occurs when stomach acids flow up from the stomach into the esophagus. Frequent symptoms of GERD may cause intestinal metaplasia or cause cell changes (dysplasia). What increases the risk? You are more likely to develop this condition if you:  Have GERD.  Are female.  Are Caucasian.  Are obese.  Are older than 50.  Have a hiatal hernia. This is a condition in which part of your stomach bulges into your chest.  Smoke. What are the signs or symptoms? People with Barrett's esophagus often have no symptoms. However, many people with this condition also have GERD. Symptoms of GERD may include:  Heartburn.  Difficulty swallowing.  Dry cough. How is this diagnosed? This condition may be diagnosed based on:  Results of an upper gastrointestinal endoscopy. For this exam, a thin, flexible tube with a light and a camera on  the end (endoscope) is passed down your esophagus. Your health care provider can view the inside of your esophagus during this procedure.  Results of a biopsy. For this procedure, several tissue samples are removed (biopsy) from your esophagus. They are then checked for intestinal metaplasia or dysplasia. How is this treated? Treatment for this condition may include:  Medicines (proton pump inhibitors, or PPIs) to decrease or stop GERD.  Periodic endoscopic exams to make sure that cancer is not developing.  A procedure or surgery for dysplasia. This may include: ? Removal or destruction of abnormal cells. ? Removal of part of the esophagus (esophagectomy). Follow these instructions at home: Eating and drinking  Eat more fruits and vegetables.  Avoid fatty foods.  Eat small, frequent meals instead of large meals.  Avoid foods that cause heartburn. These foods include: ? Coffee and alcoholic drinks. ? Tomatoes and foods made with tomatoes. ? Greasy or spicy foods. ? Chocolate and peppermint.  Do not drink alcohol. General instructions  Take over-the-counter and prescription medicines only as told by your health care provider.  Do not use any products that contain nicotine or tobacco, such as cigarettes and e-cigarettes. If you need help quitting, ask your health care provider.  If you are being treated for GERD, make sure you take medicines and follow all instructions as told by your health care provider.  Keep all follow-up visits as told by your health care provider. This is important. Contact a health care provider if:  You have heartburn or GERD symptoms.  You have difficulty swallowing. Get help right away if:  You have chest pain.  You are unable to swallow.  You vomit blood or material that looks like coffee grounds.  Your stool (feces) is bright red or dark. Summary  Barrett's esophagus occurs when the tissue that lines the esophagus changes or becomes  damaged.  Barrett's esophagus may be diagnosed with an upper gastrointestinal endoscopy and a biopsy.  Treatment may include medicines, procedures to remove abnormal cells, or surgery.  Follow your health care provider's instructions about what to eat and drink, what medicines to take, and when to call for help. This information is not intended to replace advice given to you by your health care provider. Make sure you discuss any questions you have with your health care provider. Document Released: 12/25/2003 Document Revised: 01/30/2018 Document Reviewed: 01/30/2018 Elsevier Interactive Patient Education  2019 [employer].

## 2018-11-21 ENCOUNTER — Ambulatory Visit: Payer: Medicare Other | Attending: *Deleted | Admitting: Rehabilitation

## 2018-11-21 ENCOUNTER — Encounter: Payer: Self-pay | Admitting: Rehabilitation

## 2018-11-21 DIAGNOSIS — R2681 Unsteadiness on feet: Secondary | ICD-10-CM | POA: Diagnosis not present

## 2018-11-21 DIAGNOSIS — R2689 Other abnormalities of gait and mobility: Secondary | ICD-10-CM

## 2018-11-21 DIAGNOSIS — M6281 Muscle weakness (generalized): Secondary | ICD-10-CM | POA: Insufficient documentation

## 2018-11-21 NOTE — Patient Instructions (Signed)
Access Code: [email or web address]  URL: [email or web address]  Date: 11/21/2018  Prepared by: Cameron Sprang   Exercises  Clamshell - 10 reps - 3 sets - 2 hold - 1x daily - 3x weekly  Squat with Chair Support - 10 reps - 2 sets - 2 hold - 1x daily - 3x weekly  Sit to Stand on Foam Pad - 10 reps - 1 sets - 1x daily - 5x weekly  Standing Soleus Stretch - 3 reps - 2-3 sets - 30-60 hold - 1x daily - 7x weekly  CLX Ankle Dorsiflexion and Eversion - 10 reps - 2 sets - 1x daily - 7x weekly  Romberg Stance on Foam Pad with Head Rotation - 10 reps - 1 sets - 1x daily - 7x weekly  Romberg Stance with Head Nods on Foam Pad - 10 reps - 1 sets - 1x daily - 7x weekly  Romberg Stance Eyes Closed on Foam Pad - 3 reps - 1 sets - 20 hold - 1x daily - 7x weekly

## 2018-11-21 NOTE — Therapy (Signed)
[facility] [address] [place], [place], [address] Phone: [phone]   Fax:  [phone]  Physical Therapy Treatment  Patient Details  Name: Abigail Michael MRN: [id number] Date of Birth: 25-Apr-1951 Referring Provider (PT): Dr. Raoul Pitch   Encounter Date: 11/21/2018  PT End of Session - 11/21/18 1108    Visit Number  12    Number of Visits  22    Date for PT Re-Evaluation  12/18/18    Authorization Type  Medicare + BCBS    PT Start Time  1103    PT Stop Time  1145    PT Time Calculation (min)  42 min    Equipment Utilized During Treatment  --   min guard to S prn   Activity Tolerance  Patient tolerated treatment well    Behavior During Therapy  [facility] for tasks assessed/performed       Past Medical History:  Diagnosis Date  . 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 Chicken pox   . Constipation   . Depression   . Frequent headaches   . GERD (gastroesophageal reflux disease)   . 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 Hyperlipidemia   . Hypertension   . MS (multiple sclerosis) ([place]) 12/15/2016   possible dx. many MRI w/ positive white matter abnormality. MRI reported stable since 2015. mild progressive  Cognitive loss  . Obstructive sleep apnea    on prior neuro notes 10/2016; no results.   . Osteoporosis    had been on reclast  . Post concussion syndrome 10/2013   ongoing issues with concentration, memory loss, lability; EEG completed and normal.   . TBI (traumatic brain injury) ([place]) 2014   fall at [facility], hit head off of floor. No bleed, "concussion". Seen neurology since for focus and word finding.   Marland Kitchen TIA (transient ischemic attack) 11/30/2016   MRI normal. No lateralization. Speech affected and weakness during a hypotensive episdoe.   . Urinary incontinence     Past Surgical History:  Procedure Laterality Date  . BACK SURGERY  02/2015   herniated disc L4/5/6  .  BREAST BIOPSY  1988   biopsy  . LAPAROSCOPIC HYSTERECTOMY     partial   . SHOULDER ARTHROSCOPY W/ LABRAL REPAIR Left 02/13/2016   left shoulder  . Study: cognitive eval  06/23/2016   mild cog-linguistic impairment, difficulty with verbal fluency  . Study: EEG   04/12/2015   Studies: EEG completed and normal.   . Study: MRI  04/28/2016   Impression: numerous non-enhancing flair hyperintense lesions w/in the brain .Suspicious for demyelinating disease such as lyme or MS. Her Lyme test were negative.     There were no vitals filed for this visit.  Subjective Assessment - 11/21/18 1105    Subjective  Pt reports no changes since last visit.  Pt excited about getting in the pool.      Pertinent History  depression, headaches, HTN, MS (possible dx), OSA, post-consussion syndrome, TBI, TIA    Patient Stated Goals  improve balance, feel more steady.    Currently in Pain?  No/denies                       [facility] Adult PT Treatment/Exercise - 11/21/18 1105      Self-Care   Self-Care  Other Self-Care Comments    Other Self-Care Comments   Educated on balance strategies and how  ankle weakness can impact this.  Also educated on how TBI could also cause vestibular deficits.       Neuro Re-ed    Neuro Re-ed Details   High level balance during session to emphasize compliant surfaces, ankle, hip and step strategy along with ankle strengthening:  Side stepping while on foam balance beam perpendicularly x 4 reps without UE support, forward tandem on foam beam tapping heel to floor then placing in tandem while moving forward x 4 reps, braiding while on therapy mats x 4 laps (approx 15'), while standing on blue therapy mat had pt perform "country type line dance" move tapping foot forward then backwards for SLS.  Pt did very well with all tasks, however do note medial/lateral ankle instability.  Initiated corner balance tasks as pt reports she will be going to her storage unit to get cushion  and yoga mat.  Feet apart EC x 20 secs, feet apart with head turns up/down  and side/side x 10 reps without overt LOB, therefore progressed to feet together EO with head motions up/down and side/side x 10 reps and feet together EC x 2 reps of 20 secs.  Added to HEP, see pt instruction.       Exercises   Exercises  Other Exercises    Other Exercises   Seated ankle eversion with red theraband-added to HEP.          Access Code: [email or web address]  URL: [email or web address]  Date: 11/21/2018  Prepared by: Cameron Sprang   Exercises  Clamshell - 10 reps - 3 sets - 2 hold - 1x daily - 3x weekly  Squat with Chair Support - 10 reps - 2 sets - 2 hold - 1x daily - 3x weekly  Sit to Stand on Foam Pad - 10 reps - 1 sets - 1x daily - 5x weekly  Standing Soleus Stretch - 3 reps - 2-3 sets - 30-60 hold - 1x daily - 7x weekly  CLX Ankle Dorsiflexion and Eversion - 10 reps - 2 sets - 1x daily - 7x weekly  Romberg Stance on Foam Pad with Head Rotation - 10 reps - 1 sets - 1x daily - 7x weekly  Romberg Stance with Head Nods on Foam Pad - 10 reps - 1 sets - 1x daily - 7x weekly  Romberg Stance Eyes Closed on Foam Pad - 3 reps - 1 sets - 20 hold - 1x daily - 7x weekly      PT Education - 11/21/18 1108    Education Details  water temp at aquatic PT, purpose of balance exercises.     Person(s) Educated  Patient    Methods  Explanation;Demonstration;Handout    Comprehension  Verbalized understanding;Returned demonstration       PT Short Term Goals - 11/14/18 1323      PT SHORT TERM GOAL #1   Title  Pt will be IND with HEP to improve deficits listed above. TARGET DATE FOR ALL STGS: 11/16/18    Baseline  1/28: Pt is IND with HEP.     Status  Achieved      PT SHORT TERM GOAL #2   Title  Pt will begin aquatic PT to improve strength and balance and QOL.     Baseline  1/28: Pt has confirmed appt for aquatic PT.     Status  Achieved      PT SHORT TERM GOAL #3   Title  Pt will perform  floor<>stand txfs with S, with UE support to  improve safety.    Baseline  1/28: Pt able to perform floor<> stand with S for safety and SUE support.     Status  Achieved        PT Long Term Goals - 10/19/18 1653      PT LONG TERM GOAL #1   Title  patient to be independent with advanced HEP for balance. NEW POC FOR ALL UNMET LTGS: 12/14/18    Time  4    Period  Weeks    Status  On-going      PT LONG TERM GOAL #2   Title  patient to improve Berg to >/=54/56 demonstrating reduced fall risk    Baseline  48/56 on 10/19/18    Time  4    Period  Weeks    Status  On-going      PT LONG TERM GOAL #3   Title  patient to improve FGA by >/= 26/30 demonstrating improved functional mobility and reduced fall risk    Baseline  21/30 on 10/19/18    Time  4    Period  Weeks    Status  On-going      PT LONG TERM GOAL #4   Title  patient to demonstrate Floor <> stand transfers with Mod I without need for UE support on chair/object    Time  4    Period  Weeks    Status  On-going      PT LONG TERM GOAL #5   Title  patient to demosntrate gait over various even and uneven surfaces as well as up/down ramps and inclines for >/= 500 feet with Ind with no LOB    Time  4    Period  Weeks    Status  On-going      PT LONG TERM GOAL #6   Title  patient to demonstrate participation in gym/walking program most days of the week    Time  4    Period  Weeks    Status  On-going            Plan - 11/21/18 1945    Clinical Impression Statement  Skilled session focused on addressing decreased balance with emphasis on compliance surfaces, ankle/hip/step strategy, and ankle strengthening.  Pt making good progress, however has not gotten cushion for sit<>stand exercise.  She does report she plans to get them from storage.     Rehab Potential  Good    PT Frequency  2x / week    PT Duration  8 weeks    PT Treatment/Interventions  ADLs/Self Care Home Management;Gait training;Stair training;Functional mobility  training;Therapeutic activities;Therapeutic exercise;Balance training;Patient/family education;Neuromuscular re-education;Manual techniques;Taping;Visual/perceptual remediation/compensation;Aquatic Therapy    PT Next Visit Plan   high level balance on rockerboard, compliant surfaces, dance moves : ), negotiating over obstacles on compliant surfaces, cognition challenges.     PT Home Exercise Plan  [email or web address]     Consulted and Agree with Plan of Care  Patient       Patient will benefit from skilled therapeutic intervention in order to improve the following deficits and impairments:  Decreased balance, Decreased activity tolerance, Decreased mobility, Decreased strength  Visit Diagnosis: Other abnormalities of gait and mobility  Unsteadiness on feet  Muscle weakness (generalized)     Problem List Patient Active Problem List   Diagnosis Date Noted  . Raynaud disease 10/06/2018  . Encounter for monitoring long-term proton pump inhibitor therapy 08/28/2018  . White matter disease 02/20/2018  . Osteoporosis   .  Hypercalcemia 01/17/2018  . Essential hypertension 01/16/2018  . Hypothyroidism 01/16/2018  . Depression with anxiety 01/16/2018  . Traumatic brain injury with loss of consciousness ([place]) 01/16/2018  . Hyperlipidemia     Denice Bors 11/21/2018, 7:47 PM  [facility] [address] [place], [place], [address] Phone: [phone]   Fax:  [phone]  Name: Abigail Michael MRN: [id number] Date of Birth: 1950-11-30

## 2018-11-22 ENCOUNTER — Ambulatory Visit: Payer: Medicare Other | Admitting: Physical Therapy

## 2018-11-22 DIAGNOSIS — R2681 Unsteadiness on feet: Secondary | ICD-10-CM

## 2018-11-22 DIAGNOSIS — R2689 Other abnormalities of gait and mobility: Secondary | ICD-10-CM | POA: Diagnosis not present

## 2018-11-22 DIAGNOSIS — M6281 Muscle weakness (generalized): Secondary | ICD-10-CM | POA: Diagnosis not present

## 2018-11-23 NOTE — Therapy (Signed)
[facility] [address] [place], [place], [address] Phone: [phone]   Fax:  [phone]  Physical Therapy Treatment  Patient Details  Name: Abigail Michael MRN: [id number] Date of Birth: 19-Jul-1951 Referring Provider (PT): Dr. Raoul Pitch   Encounter Date: 11/22/2018  PT End of Session - 11/23/18 0906    Visit Number  13    Number of Visits  22    Date for PT Re-Evaluation  12/18/18    Authorization Type  Medicare + BCBS    PT Start Time  1500    PT Stop Time  1550    PT Time Calculation (min)  50 min       Past Medical History:  Diagnosis Date  . 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 Chicken pox   . Constipation   . Depression   . Frequent headaches   . GERD (gastroesophageal reflux disease)   . 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 Hyperlipidemia   . Hypertension   . MS (multiple sclerosis) ([place]) 12/15/2016   possible dx. many MRI w/ positive white matter abnormality. MRI reported stable since 2015. mild progressive  Cognitive loss  . Obstructive sleep apnea    on prior neuro notes 10/2016; no results.   . Osteoporosis    had been on reclast  . Post concussion syndrome 10/2013   ongoing issues with concentration, memory loss, lability; EEG completed and normal.   . TBI (traumatic brain injury) ([place]) 2014   fall at [facility], hit head off of floor. No bleed, "concussion". Seen neurology since for focus and word finding.   Marland Kitchen TIA (transient ischemic attack) 11/30/2016   MRI normal. No lateralization. Speech affected and weakness during a hypotensive episdoe.   . Urinary incontinence     Past Surgical History:  Procedure Laterality Date  . BACK SURGERY  02/2015   herniated disc L4/5/6  . BREAST BIOPSY  1988   biopsy  . LAPAROSCOPIC HYSTERECTOMY     partial   . SHOULDER ARTHROSCOPY W/ LABRAL REPAIR Left 02/13/2016   left shoulder  . Study: cognitive eval   06/23/2016   mild cog-linguistic impairment, difficulty with verbal fluency  . Study: EEG   04/12/2015   Studies: EEG completed and normal.   . Study: MRI  04/28/2016   Impression: numerous non-enhancing flair hyperintense lesions w/in the brain .Suspicious for demyelinating disease such as lyme or MS. Her Lyme test were negative.     There were no vitals filed for this visit.  Subjective Assessment - 11/23/18 0905    Subjective  Pt presents for aquatic therapy at [facility] - reports no problems; says it's been about a year since she has done any pool exercises    Pertinent History  depression, headaches, HTN, MS (possible dx), OSA, post-consussion syndrome, TBI, TIA    Patient Stated Goals  improve balance, feel more steady.    Currently in Pain?  No/denies       Aquatic therapy - pool temp 87.2 degrees   Patient seen for aquatic therapy today.  Treatment took place in water 2.5-4 feet deep depending upon activity.  Pt entered the pool via Step negotiation modified independently with use of hand rails.  Pt performed bil. Hip strengthening exercises in standing with UE support on pool wall - hip flexion, extension, abduction and adduction with use of Ankle buoyant cuff weight for increased resistance with eccentric motion - 10  reps each   Pt performed heel raises x 10 reps; marching in place 10 reps Marching forwards and backwards approx. 61' across pool without UE support  Pt performed water walking in 4' water depth - forwards, backwards and sideways - approx. 50' x 2 reps each;  Pt performed standing with feet apart with head turns  - EO and then with EC; feet together with EO with horizontal and vertical head turns, then with EC with head turns with mild LOB with self recovery; use of buoyancy of water for support with these balance exercises to be performed safely without risk of falling  Squats x 10 reps for bil. LE strengthening with UE support on pool wall using viscosity of water  for resistance  Ai Chi postures for core stabilization, strengthening and balance training - Gathering, welcoming and accepting postures - 5 reps each with mild LOB  Pt requires buoyancy of water for support with balance exs. and viscosity of water for resistance for strengthening exs.  Water provides safe environment to perform balance exercises safely without risk of fall without UE support                        PT Short Term Goals - 11/14/18 1323      PT SHORT TERM GOAL #1   Title  Pt will be IND with HEP to improve deficits listed above. TARGET DATE FOR ALL STGS: 11/16/18    Baseline  1/28: Pt is IND with HEP.     Status  Achieved      PT SHORT TERM GOAL #2   Title  Pt will begin aquatic PT to improve strength and balance and QOL.     Baseline  1/28: Pt has confirmed appt for aquatic PT.     Status  Achieved      PT SHORT TERM GOAL #3   Title  Pt will perform floor<>stand txfs with S, with UE support to improve safety.    Baseline  1/28: Pt able to perform floor<> stand with S for safety and SUE support.     Status  Achieved        PT Long Term Goals - 10/19/18 1653      PT LONG TERM GOAL #1   Title  patient to be independent with advanced HEP for balance. NEW POC FOR ALL UNMET LTGS: 12/14/18    Time  4    Period  Weeks    Status  On-going      PT LONG TERM GOAL #2   Title  patient to improve Berg to >/=54/56 demonstrating reduced fall risk    Baseline  48/56 on 10/19/18    Time  4    Period  Weeks    Status  On-going      PT LONG TERM GOAL #3   Title  patient to improve FGA by >/= 26/30 demonstrating improved functional mobility and reduced fall risk    Baseline  21/30 on 10/19/18    Time  4    Period  Weeks    Status  On-going      PT LONG TERM GOAL #4   Title  patient to demonstrate Floor <> stand transfers with Mod I without need for UE support on chair/object    Time  4    Period  Weeks    Status  On-going      PT LONG TERM GOAL #5    Title  patient  to demosntrate gait over various even and uneven surfaces as well as up/down ramps and inclines for >/= 500 feet with Ind with no LOB    Time  4    Period  Weeks    Status  On-going      PT LONG TERM GOAL #6   Title  patient to demonstrate participation in gym/walking program most days of the week    Time  4    Period  Weeks    Status  On-going            Plan - 11/23/18 0909    Clinical Impression Statement  Pt tolerated aquatic session well with pt demonstrating mildly decreased trunk control with balance and trunk stabilization required for Ai Chi postures.  Pt positioned near pool wall for safety but did not lean on pool wall; pt was able to recover balance without external support.  Pt needed verbal and visual cues with exercises requiring coordination .       Patient will benefit from skilled therapeutic intervention in order to improve the following deficits and impairments:     Visit Diagnosis: Other abnormalities of gait and mobility  Unsteadiness on feet  Muscle weakness (generalized)     Problem List Patient Active Problem List   Diagnosis Date Noted  . Raynaud disease 10/06/2018  . Encounter for monitoring long-term proton pump inhibitor therapy 08/28/2018  . White matter disease 02/20/2018  . Osteoporosis   . Hypercalcemia 01/17/2018  . Essential hypertension 01/16/2018  . Hypothyroidism 01/16/2018  . Depression with anxiety 01/16/2018  . Traumatic brain injury with loss of consciousness ([place]) 01/16/2018  . Hyperlipidemia     Torrance Stockley, [place], PT 11/23/2018, 12:58 PM  [facility] [address] [place], [place], [address] Phone: [phone]   Fax:  [phone]  Name: Abigail Michael MRN: [id number] Date of Birth: Feb 16, 1951

## 2018-11-24 ENCOUNTER — Ambulatory Visit: Payer: Self-pay | Admitting: Rehabilitation

## 2018-11-24 DIAGNOSIS — R9082 White matter disease, unspecified: Secondary | ICD-10-CM | POA: Diagnosis not present

## 2018-11-24 DIAGNOSIS — R5382 Chronic fatigue, unspecified: Secondary | ICD-10-CM | POA: Diagnosis not present

## 2018-11-24 DIAGNOSIS — N3281 Overactive bladder: Secondary | ICD-10-CM | POA: Diagnosis not present

## 2018-11-24 DIAGNOSIS — Z6831 Body mass index (BMI) 31.0-31.9, adult: Secondary | ICD-10-CM | POA: Diagnosis not present

## 2018-11-24 DIAGNOSIS — R419 Unspecified symptoms and signs involving cognitive functions and awareness: Secondary | ICD-10-CM | POA: Diagnosis not present

## 2018-11-24 DIAGNOSIS — Z8673 Personal history of transient ischemic attack (TIA), and cerebral infarction without residual deficits: Secondary | ICD-10-CM | POA: Diagnosis not present

## 2018-11-26 DIAGNOSIS — N3281 Overactive bladder: Secondary | ICD-10-CM | POA: Insufficient documentation

## 2018-11-28 ENCOUNTER — Telehealth: Payer: Self-pay | Admitting: *Deleted

## 2018-11-28 ENCOUNTER — Ambulatory Visit: Payer: Medicare Other | Admitting: Physical Therapy

## 2018-11-28 NOTE — Telephone Encounter (Signed)
Called Pts cellphone and left detailed message, OKAY per DPR, that her nexium 40mg  was sent to pharmacy on 11/20/2018 and for her to return the call if there was still a issue.

## 2018-11-28 NOTE — Telephone Encounter (Signed)
[facility] Night - Client [facility] Patient Name: Abigail Michael Gender: Female DOB: 1951-06-08  Age: 68 Y 31 M 12 D Return Phone Number: [phone] (Primary) Address:  City/State/Zip: [place]  [address] Client [facility] Night - Client Client Site [facility] Night Physician Raoul Pitch, [place] Type Call Who Is Calling Patient / Member / Family / Caregiver Call Type Triage / Clinical Relationship To Patient Self Return Phone Number [phone] (Primary) Chief Complaint Prescription Refill or Medication Request (non symptomatic) Reason for Call Medication Question / Request Initial Comment Caller states, was seen on Monday , advised to start Nexium 40 mg, and patient thought she had enough so a new Rx was not called in. However, she is out, so she needs a new Rx. Translation No Nurse Assessment Nurse: Quay Burow, RN, Rodena Piety Date/Time (Eastern Time): 11/25/2018 9:15:49 AM Confirm and document reason for call. If symptomatic, describe symptoms. ---Caller states, was seen on Monday , advised to start Nexium 40 mg, and patient thought she had enough of her previous Nexium 20mg  that she quit taking so a new Rx was not called in. However, she only had (2) Nexium 20mg  pills left, so she needs a new Rx called in. Has the patient traveled to [place] OR had close contact with a person known to have the novel coronavirus illness in the last 14 days? ---No Does the patient have any new or worsening symptoms? ---Yes Will a triage be completed? ---Yes Related visit to physician within the last 2 weeks? ---Yes Does the PT have any chronic conditions? (i.e. diabetes, asthma, this includes High risk factors for pregnancy, etc.) ---Yes List chronic conditions. ---GERD Is this a behavioral health or substance abuse call? ---No Nurse: Quay Burow, RN, Rodena Piety Date/Time (Eastern Time): 11/25/2018 9:24:24 AM Please  select the assessment type ---Refill Additional Documentation --------Caller states, was seen on Monday , advised to start Nexium 40 mg, and patient thought she had enough of her previous Nexium 20mg  that she quit taking so a new Rx was not called in. However, she only had (2) Nexium 20mg  pills left, so she needs a new Rx called in. PLEASE NOTE:  All timestamps contained within this report are represented as [place] Standard Time. CONFIDENTIALTY NOTICE: This fax transmission is intended only for the addressee.  It contains information that is legally privileged, confidential or otherwise protected from use or disclosure.  If you are not the intended recipient, you are strictly prohibited from reviewing, disclosing, copying using or disseminating any of this information or taking any action in reliance on or regarding this information.  If you have received this fax in error, please notify us immediately by telephone so that we can arrange for its return to [place]. Phone:  [phone], Toll-Free:  [phone], Fax:  [phone] Page: 2 of 2 Call Id: [id number] Nurse Assessment Does the patient have enough medication to last until the office opens? ---Yes Additional Documentation ---Advised pt to refill her Nexium 20mg  tablets and call office Monday to get the 40mg  tablets called in. Told to take (2) of the 20mg  tabs to equal the 40mg  dosage like MD advised. Guidelines Guideline Title Affirmed Question Affirmed Notes Nurse Date/Time Eilene Ghazi Time) Abdominal Pain - Upper Abdominal pain is a chronic symptom (recurrent or ongoing AND present > 4 weeks)  Quay Burow, RN, Rodena Piety 11/25/2018 9:19:54 AM Disp. Time Eilene Ghazi Time) Disposition Final User 11/25/2018 9:29:07 AM See PCP within 2  Weeks Yes Quay Burow, RN, Inis Sizer Disagree/Comply Comply Caller Understands Yes PreDisposition Home Care Care Advice Given Per Guideline SEE PCP WITHIN 2 WEEKS: * You need to be seen for this ongoing problem within the  next 2 weeks. Call your doctor (or NP/ PA) during regular office hours and make an appointment. REASSURANCE AND EDUCATION: It doesn't sound like a serious stomachache, but recurrent abdominal pains deserve a complete medical checkup. AVOID ASPIRIN: * Do not take aspirin and antiinflammatory medications (i.e., NSAIDS like motrin, advil, aleve, naproxen) unless approved/instructed to by your physician. These medications can cause stomach irritation. PAIN DIARY: Keep a pain diary. Include the date, time, place, what you were doing at the time, severity, duration, what helps, etc. (Reason: try to find some of the triggers.) CALL BACK IF: * You become worse. CARE ADVICE given per Abdominal Pain, Upper (Adult) guideline. Referrals REFERRED TO PCP OFFICE

## 2018-12-01 ENCOUNTER — Ambulatory Visit: Payer: Self-pay

## 2018-12-01 ENCOUNTER — Ambulatory Visit: Payer: Self-pay | Admitting: Rehabilitation

## 2018-12-05 ENCOUNTER — Ambulatory Visit: Payer: Self-pay | Admitting: Rehabilitation

## 2018-12-08 ENCOUNTER — Ambulatory Visit: Payer: Medicare Other | Admitting: Physical Therapy

## 2018-12-08 ENCOUNTER — Encounter: Payer: Self-pay | Admitting: Family Medicine

## 2018-12-12 ENCOUNTER — Ambulatory Visit: Payer: Medicare Other | Admitting: Physical Therapy

## 2018-12-12 DIAGNOSIS — R2681 Unsteadiness on feet: Secondary | ICD-10-CM | POA: Diagnosis not present

## 2018-12-12 DIAGNOSIS — R2689 Other abnormalities of gait and mobility: Secondary | ICD-10-CM

## 2018-12-12 DIAGNOSIS — M6281 Muscle weakness (generalized): Secondary | ICD-10-CM

## 2018-12-13 ENCOUNTER — Encounter: Payer: Self-pay | Admitting: Gastroenterology

## 2018-12-13 NOTE — Therapy (Signed)
[facility] [address] [place], [place], [address] Phone: [phone]   Fax:  [phone]  Physical Therapy Treatment  Patient Details  Name: Abigail Michael MRN: [id number] Date of Birth: [birth year] Referring Provider (PT): Dr. Raoul Pitch   Encounter Date: 12/12/2018     12/12/18 0941  PT Visits / Re-Eval  Visit Number 14  Number of Visits 22  Date for PT Re-Evaluation 12/18/18  Authorization  Authorization Type Medicare + BCBS  PT Time Calculation  PT Start Time 0930  PT Stop Time 1010  PT Time Calculation (min) 40 min  PT - End of Session  Equipment Utilized During Treatment Gait belt  Activity Tolerance Patient tolerated treatment well  Behavior During Therapy [facility] for tasks assessed/performed    Past Medical History:  Diagnosis Date  . Acid reflux   . 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 Chicken pox   . Constipation   . Depression   . Frequent headaches   . GERD (gastroesophageal reflux disease)   . 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 Hyperlipidemia   . Hypertension   . MS (multiple sclerosis) ([place]) 12/15/2016   possible dx. many MRI w/ positive white matter abnormality. MRI reported stable since 2015. mild progressive  Cognitive loss  . Obstructive sleep apnea    on prior neuro notes 10/2016; no results.   . Osteoporosis    had been on reclast  . Post concussion syndrome 10/2013   ongoing issues with concentration, memory loss, lability; EEG completed and normal.   . TBI (traumatic brain injury) ([place]) 2014   fall at [name], hit head off of floor. No bleed, "concussion". Seen neurology since for focus and word finding.   Marland Kitchen TIA (transient ischemic attack) 11/30/2016   MRI normal. No lateralization. Speech affected and weakness during a hypotensive episdoe.   . Urinary incontinence     Past Surgical History:  Procedure Laterality Date  . BACK SURGERY   02/2015   herniated disc L4/5/6  . BREAST BIOPSY  1988   biopsy  . LAPAROSCOPIC HYSTERECTOMY     partial   . SHOULDER ARTHROSCOPY W/ LABRAL REPAIR Left 02/13/2016   left shoulder  . Study: cognitive eval  06/23/2016   mild cog-linguistic impairment, difficulty with verbal fluency  . Study: EEG   04/12/2015   Studies: EEG completed and normal.   . Study: MRI  04/28/2016   Impression: numerous non-enhancing flair hyperintense lesions w/in the brain .Suspicious for demyelinating disease such as lyme or MS. Her Lyme test were negative.     There were no vitals filed for this visit.     12/12/18 0939  Symptoms/Limitations  Subjective No new complaitns. No falls. Having some left knee pain, unsure what caused it.   Pertinent History depression, headaches, HTN, MS (possible dx), OSA, post-consussion syndrome, TBI, TIA  Patient Stated Goals improve balance, feel more steady.  Pain Assessment  Currently in Pain? Yes  Pain Score 2  Pain Location Knee  Pain Orientation Left  Pain Descriptors / Indicators Tender  Pain Type Acute pain  Pain Onset In the past 7 days  Pain Frequency Intermittent  Aggravating Factors  unsure  Pain Relieving Factors unsure, been massaging it        12/12/18 1007  Transfers  Transfers Sit to Stand;Stand to Sit;Floor to Transfer  Sit to Stand 6: Modified independent (Device/Increase time)  Stand to Sit 6: Modified  independent (Device/Increase time)  Ambulation/Gait  Ambulation/Gait Yes  Ambulation/Gait Assistance 6: Modified independent (Device/Increase time)  Ambulation/Gait Assistance Details no balance issues noted with gait in session   Ambulation Distance (Feet)  (around gym with activity)  Assistive device None  Gait Pattern WFL  Ambulation Surface Level;Indoor       12/12/18 0945  Berg Balance Test  Sit to Stand 4  Standing Unsupported 4  Sitting with Back Unsupported but Feet Supported on Floor or Stool 4  Stand to Sit 4  Transfers  4  Standing Unsupported with Eyes Closed 4  Standing Ubsupported with Feet Together 4  From Standing, Reach Forward with Outstretched Arm 4  From Standing Position, Pick up Object from Floor 4  From Standing Position, Turn to Look Behind Over each Shoulder 4  Turn 360 Degrees 4  Standing Unsupported, Alternately Place Feet on Step/Stool 4 (16.10 sec's)  Standing Unsupported, One Foot in Front 3  Standing on One Leg 3  Total Score 54  Berg comment: 54/56= lower risk for falls  Functional Gait  Assessment  Gait assessed  Yes  Gait Level Surface 2 (6.13 sec's)  Change in Gait Speed 3  Gait with Horizontal Head Turns 3  Gait with Vertical Head Turns 3  Gait and Pivot Turn 3  Step Over Obstacle 3  Gait with Narrow Base of Support 2  Gait with Eyes Closed 1 (toe scuff with pt self correcting)  Ambulating Backwards 2  Steps 3  Total Score 25  FGA comment: 25/30= lower risk for falls.        PT Short Term Goals - 11/14/18 1323      PT SHORT TERM GOAL #1   Title  Pt will be IND with HEP to improve deficits listed above. TARGET DATE FOR ALL STGS: 11/16/18    Baseline  1/28: Pt is IND with HEP.     Status  Achieved      PT SHORT TERM GOAL #2   Title  Pt will begin aquatic PT to improve strength and balance and QOL.     Baseline  1/28: Pt has confirmed appt for aquatic PT.     Status  Achieved      PT SHORT TERM GOAL #3   Title  Pt will perform floor<>stand txfs with S, with UE support to improve safety.    Baseline  1/28: Pt able to perform floor<> stand with S for safety and SUE support.     Status  Achieved        PT Long Term Goals - 12/12/18 0941      PT LONG TERM GOAL #1   Title  patient to be independent with advanced HEP for balance. NEW POC FOR ALL UNMET LTGS: 12/14/18    Baseline  12/12/18: doing the HEP and walking program. Still challenged by current program.     Status  Achieved      PT LONG TERM GOAL #2   Title  patient to improve Berg to >/=54/56  demonstrating reduced fall risk    Baseline  48/56 on 10/19/18    Time  4    Period  Weeks    Status  On-going      PT LONG TERM GOAL #3   Title  patient to improve FGA by >/= 26/30 demonstrating improved functional mobility and reduced fall risk    Baseline  21/30 on 10/19/18    Time  4    Period  Weeks  Status  On-going      PT LONG TERM GOAL #4   Title  patient to demonstrate Floor <> stand transfers with Mod I without need for UE support on chair/object    Time  4    Period  Weeks    Status  On-going      PT LONG TERM GOAL #5   Title  patient to demosntrate gait over various even and uneven surfaces as well as up/down ramps and inclines for >/= 500 feet with Ind with no LOB    Time  4    Period  Weeks    Status  On-going      PT LONG TERM GOAL #6   Title  patient to demonstrate participation in gym/walking program most days of the week    Time  4    Period  Weeks    Status  On-going         12/12/18 [place] skilled session began to focus on progress toward LTGs with pt meeting 2 goals checked and other goal was partially met. Will plan to check remaining goals at next visit with plan to recert for a few weeks for further aquatic therapy sessions to complete pt's home program with the pool.   Pt will benefit from skilled therapeutic intervention in order to improve on the following deficits Decreased balance;Decreased activity tolerance;Decreased mobility;Decreased strength  Rehab Potential Good  PT Frequency 2x / week  PT Duration 8 weeks  PT Treatment/Interventions ADLs/Self Care Home Management;Gait training;Stair training;Functional mobility training;Therapeutic activities;Therapeutic exercise;Balance training;Patient/family education;Neuromuscular re-education;Manual techniques;Taping;Visual/perceptual remediation/compensation;Aquatic Therapy  PT Next Visit Plan check remaining goals with plan to recert for a few weeks to finish  out aquatic sessions to complete program for home in pool.   PT Home Exercise Plan [id number]   Consulted and Agree with Plan of Care Patient            Patient will benefit from skilled therapeutic intervention in order to improve the following deficits and impairments:     Visit Diagnosis: Other abnormalities of gait and mobility  Unsteadiness on feet  Muscle weakness (generalized)     Problem List Patient Active Problem List   Diagnosis Date Noted  . Raynaud disease 10/06/2018  . Encounter for monitoring long-term proton pump inhibitor therapy 08/28/2018  . White matter disease 02/20/2018  . Osteoporosis   . Hypercalcemia 01/17/2018  . Essential hypertension 01/16/2018  . Hypothyroidism 01/16/2018  . Depression with anxiety 01/16/2018  . Traumatic brain injury with loss of consciousness ([place]) 01/16/2018  . Hyperlipidemia     Willow Ora, PTA, [facility] [address], [place], [place] [address] [phone] 12/13/18, 10:18 PM   Name: Abigail Michael MRN: [id number] Date of Birth: 1951-02-28

## 2018-12-15 ENCOUNTER — Encounter: Payer: Self-pay | Admitting: Rehabilitation

## 2018-12-15 ENCOUNTER — Ambulatory Visit: Payer: Medicare Other | Admitting: Rehabilitation

## 2018-12-15 DIAGNOSIS — M6281 Muscle weakness (generalized): Secondary | ICD-10-CM | POA: Diagnosis not present

## 2018-12-15 DIAGNOSIS — R2689 Other abnormalities of gait and mobility: Secondary | ICD-10-CM | POA: Diagnosis not present

## 2018-12-15 DIAGNOSIS — R2681 Unsteadiness on feet: Secondary | ICD-10-CM

## 2018-12-15 NOTE — Therapy (Signed)
[facility] [address] [place], [place], [address] Phone: [phone]   Fax:  [phone]  Physical Therapy Treatment  Patient Details  Name: Abigail Michael MRN: [id number] Date of Birth: 1951/10/03 Referring Provider (PT): Dr. Raoul Pitch   Encounter Date: 12/15/2018  PT End of Session - 12/15/18 0935    Visit Number  15    Number of Visits  22    Date for PT Re-Evaluation  12/29/18   per updated POC   Authorization Type  Medicare + BCBS    PT Start Time  0930    PT Stop Time  1008    PT Time Calculation (min)  38 min    Equipment Utilized During Treatment  Gait belt    Activity Tolerance  Patient tolerated treatment well    Behavior During Therapy  WFL for tasks assessed/performed       Past Medical History:  Diagnosis Date  . Acid reflux   . 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 Chicken pox   . Constipation   . Depression   . Frequent headaches   . GERD (gastroesophageal reflux disease)   . 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 Hyperlipidemia   . Hypertension   . MS (multiple sclerosis) ([place]) 12/15/2016   possible dx. many MRI w/ positive white matter abnormality. MRI reported stable since 2015. mild progressive  Cognitive loss  . Obstructive sleep apnea    on prior neuro notes 10/2016; no results.   . Osteoporosis    had been on reclast  . Post concussion syndrome 10/2013   ongoing issues with concentration, memory loss, lability; EEG completed and normal.   . TBI (traumatic brain injury) ([place]) 2014   fall at [facility], hit head off of floor. No bleed, "concussion". Seen neurology since for focus and word finding.   Marland Kitchen TIA (transient ischemic attack) 11/30/2016   MRI normal. No lateralization. Speech affected and weakness during a hypotensive episdoe.   . Urinary incontinence     Past Surgical History:  Procedure Laterality Date  . BACK SURGERY  02/2015    herniated disc L4/5/6  . BREAST BIOPSY  1988   biopsy  . LAPAROSCOPIC HYSTERECTOMY     partial   . SHOULDER ARTHROSCOPY W/ LABRAL REPAIR Left 02/13/2016   left shoulder  . Study: cognitive eval  06/23/2016   mild cog-linguistic impairment, difficulty with verbal fluency  . Study: EEG   04/12/2015   Studies: EEG completed and normal.   . Study: MRI  04/28/2016   Impression: numerous non-enhancing flair hyperintense lesions w/in the brain .Suspicious for demyelinating disease such as lyme or MS. Her Lyme test were negative.     There were no vitals filed for this visit.  Subjective Assessment - 12/15/18 0933    Subjective  Pt reports feeling better about progress.     Pertinent History  depression, headaches, HTN, MS (possible dx), OSA, post-consussion syndrome, TBI, TIA    Patient Stated Goals  improve balance, feel more steady.    Currently in Pain?  Yes    Pain Score  3     Pain Location  Back    Pain Orientation  Lower    Pain Descriptors / Indicators  Aching    Pain Type  Chronic pain    Pain Onset  More than a month ago    Pain Frequency  Intermittent    Aggravating Factors  first thing in the morning    Pain Relieving Factors  stretching, moving                        [facility] Adult PT Treatment/Exercise - 12/15/18 1010      Ambulation/Gait   Ambulation/Gait  Yes    Ambulation/Gait Assistance  6: Modified independent (Device/Increase time)    Ambulation/Gait Assistance Details  Pt able to ambulate over varying outdoor surfaces including curbs, inclines/declines at mod I level today.  Pt reports mild low back pain and ankle soreness following walk.  Feel that she likely still has B ankle weakness.  Pt reports she feels that L ankle feels weaker and that this is foot that catches.  PT demo'd foot up brace to pt during session and did don for pt during session.  Pt reports this helps a lot and would be interested in purchasing.  PT provided handout from [facility] if  she chooses to purchse.     Ambulation Distance (Feet)  1100 Feet    Assistive device  None    Gait Pattern  Within Functional Limits    Ambulation Surface  Level;Unlevel;Indoor;Outdoor;Paved;Grass    Curb  6: Modified independent (Device/increase time)      TA:  Performed floor to stand transfer without UE support x 2 reps without LOB at mod I level.    Self Care: Discussed benefits of foot up brace esp when ambulating longer distances in case of fatigue.  Also discussed return to therapy in future if she ever feels that function has had a decline.         PT Education - 12/15/18 1014    Education Details  POC extending for 2 weeks for pool    Person(s) Educated  Patient    Methods  Explanation    Comprehension  Verbalized understanding       PT Short Term Goals - 11/14/18 1323      PT SHORT TERM GOAL #1   Title  Pt will be IND with HEP to improve deficits listed above. TARGET DATE FOR ALL STGS: 11/16/18    Baseline  1/28: Pt is IND with HEP.     Status  Achieved      PT SHORT TERM GOAL #2   Title  Pt will begin aquatic PT to improve strength and balance and QOL.     Baseline  1/28: Pt has confirmed appt for aquatic PT.     Status  Achieved      PT SHORT TERM GOAL #3   Title  Pt will perform floor<>stand txfs with S, with UE support to improve safety.    Baseline  1/28: Pt able to perform floor<> stand with S for safety and SUE support.     Status  Achieved        PT Long Term Goals - 12/15/18 8280      PT LONG TERM GOAL #1   Title  patient to be independent with advanced HEP for balance. NEW POC FOR ALL UNMET LTGS: 12/14/18    Baseline  12/12/18: doing the HEP and walking program. Still challenged by current program.     Status  Achieved      PT LONG TERM GOAL #2   Title  patient to improve Berg to >/=54/56 demonstrating reduced fall risk    Baseline  12/12/18: 54/56 scored today    Status  Achieved      PT LONG TERM GOAL #3  Title  patient to improve FGA by  >/= 26/30 demonstrating improved functional mobility and reduced fall risk    Baseline  12/12/18: 25/30, improved just not to goal     Status  Partially Met      PT LONG TERM GOAL #4   Title  patient to demonstrate Floor <> stand transfers with Mod I without need for UE support on chair/object    Baseline  met 12/15/18    Time  4    Period  Weeks    Status  Achieved      PT LONG TERM GOAL #5   Title  patient to demosntrate gait over various even and uneven surfaces as well as up/down ramps and inclines for >/= 500 feet with Ind with no LOB    Baseline  met 12/15/18    Time  4    Period  Weeks    Status  Achieved      PT LONG TERM GOAL #6   Title  patient to demonstrate participation in gym/walking program most days of the week    Baseline  Pt reports planning to return to walking in the neighborhood     Time  4    Period  Weeks    Status  Partially Met          PT Long Term Goals - 12/15/18 1019      PT LONG TERM GOAL #1   Title  Pt will be independent with pool HEP in order to indicate improved balance and strength.  (Target Date: 12/29/18)    Time  2    Period  Weeks    Status  New      PT LONG TERM GOAL #2   Title  Pt will verbalize return to community fitness program and walking program to continue to maintain gains made in therapy.     Time  2    Period  Weeks    Status  New          Plan - 12/15/18 1008    Clinical Impression Statement  Skilled session assessed remaining LTGs.  Pt hsa met 4/6 LTGs, partially meeting remaining 2 goals for community fitness and FGA goal.  She conitnues to have ankle weakness and high level balance deficits.  PT to extend POC for 2 more weeks to get established pool HEP.      Rehab Potential  Good    PT Frequency  1x / week    PT Duration  2 weeks    PT Treatment/Interventions  ADLs/Self Care Home Management;Gait training;Stair training;Functional mobility training;Therapeutic activities;Therapeutic exercise;Balance  training;Patient/family education;Neuromuscular re-education;Manual techniques;Taping;Visual/perceptual remediation/compensation;Aquatic Therapy    PT Next Visit Plan  Has HEP for pool goal and community fitness goals left.  [place] she needs balance and ankle strengthening in pool.     PT Home Exercise Plan  [id number]     Consulted and Agree with Plan of Care  Patient       Patient will benefit from skilled therapeutic intervention in order to improve the following deficits and impairments:  Decreased balance, Decreased activity tolerance, Decreased mobility, Decreased strength  Visit Diagnosis: Other abnormalities of gait and mobility  Unsteadiness on feet  Muscle weakness (generalized)     Problem List Patient Active Problem List   Diagnosis Date Noted  . Raynaud disease 10/06/2018  . Encounter for monitoring long-term proton pump inhibitor therapy 08/28/2018  . White matter disease 02/20/2018  . Osteoporosis   . Hypercalcemia  01/17/2018  . Essential hypertension 01/16/2018  . Hypothyroidism 01/16/2018  . Depression with anxiety 01/16/2018  . Traumatic brain injury with loss of consciousness ([place]) 01/16/2018  . Hyperlipidemia     Cameron Sprang, PT, MPT [facility] [address] [place], [place], [address] Phone: [phone]   Fax:  [phone] 12/15/18, 10:19 AM  Name: Lauran Romanski MRN: [id number] Date of Birth: 02/12/1951

## 2018-12-19 DIAGNOSIS — K05329 Chronic periodontitis, generalized, unspecified severity: Secondary | ICD-10-CM | POA: Diagnosis not present

## 2018-12-20 ENCOUNTER — Ambulatory Visit: Payer: Medicare Other | Attending: *Deleted | Admitting: Physical Therapy

## 2018-12-20 DIAGNOSIS — M6281 Muscle weakness (generalized): Secondary | ICD-10-CM

## 2018-12-20 DIAGNOSIS — R2681 Unsteadiness on feet: Secondary | ICD-10-CM | POA: Diagnosis not present

## 2018-12-20 DIAGNOSIS — R2689 Other abnormalities of gait and mobility: Secondary | ICD-10-CM | POA: Diagnosis not present

## 2018-12-21 NOTE — Therapy (Signed)
[facility] [address] [place], [place], [address] Phone: [phone]   Fax:  [phone]  Physical Therapy Treatment  Patient Details  Name: Abigail Michael MRN: [id number] Date of Birth: 08/12/1951 Referring Provider (PT): Dr. Raoul Pitch   Encounter Date: 12/20/2018  PT End of Session - 12/21/18 2022    Visit Number  16    Number of Visits  22    Date for PT Re-Evaluation  12/29/18    Authorization Type  Medicare + BCBS    PT Start Time  1500    PT Stop Time  1545    PT Time Calculation (min)  45 min    Activity Tolerance  Patient tolerated treatment well    Behavior During Therapy  [facility] for tasks assessed/performed       Past Medical History:  Diagnosis Date  . Acid reflux   . 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 Chicken pox   . Constipation   . Depression   . Frequent headaches   . GERD (gastroesophageal reflux disease)   . 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 Hyperlipidemia   . Hypertension   . MS (multiple sclerosis) ([place]) 12/15/2016   possible dx. many MRI w/ positive white matter abnormality. MRI reported stable since 2015. mild progressive  Cognitive loss  . Obstructive sleep apnea    on prior neuro notes 10/2016; no results.   . Osteoporosis    had been on reclast  . Post concussion syndrome 10/2013   ongoing issues with concentration, memory loss, lability; EEG completed and normal.   . TBI (traumatic brain injury) ([place]) 2014   fall at [facility], hit head off of floor. No bleed, "concussion". Seen neurology since for focus and word finding.   Marland Kitchen TIA (transient ischemic attack) 11/30/2016   MRI normal. No lateralization. Speech affected and weakness during a hypotensive episdoe.   . Urinary incontinence     Past Surgical History:  Procedure Laterality Date  . BACK SURGERY  02/2015   herniated disc L4/5/6  . BREAST BIOPSY  1988   biopsy  . LAPAROSCOPIC  HYSTERECTOMY     partial   . SHOULDER ARTHROSCOPY W/ LABRAL REPAIR Left 02/13/2016   left shoulder  . Study: cognitive eval  06/23/2016   mild cog-linguistic impairment, difficulty with verbal fluency  . Study: EEG   04/12/2015   Studies: EEG completed and normal.   . Study: MRI  04/28/2016   Impression: numerous non-enhancing flair hyperintense lesions w/in the brain .Suspicious for demyelinating disease such as lyme or MS. Her Lyme test were negative.     There were no vitals filed for this visit.  Subjective Assessment - 12/21/18 2021    Subjective  Pt states she is doing much better - feels balance is really improving; pt presents for aquatic therapy at [facility] - states she loves the temperature of the water at this pool    Pertinent History  depression, headaches, HTN, MS (possible dx), OSA, post-consussion syndrome, TBI, TIA    Patient Stated Goals  improve balance, feel more steady.    Currently in Pain?  No/denies           Aquatic therapy - pool temp 87.6 degrees   Patient seen for aquatic therapy today.  Treatment took place in water 3.5-4 feet deep depending upon activity.  Pt entered the pool via Ramp negotiation modified independently with use of one  rail.   Pt performed bil. Hip strengthening exercises in standing with UE support on pool wall - hip flexion, extension, abduction and adduction with use of Ankle buoyant cuff weight for increased resistance with eccentric motion - 10 reps each   Pt performed heel raises x 10 reps; marching in place 10 reps; toe raises x 10 reps Marching forwards and backwards 32m x 2 reps' across pool without UE support  Pt performed water walking in 4' water depth - forwards, backwards and sideways - approx. 50' x 2 reps each;  Pt performed standing with feet apart with head turns  - EO and then with EC; feet together with EO with horizontal and vertical head turns, then with EC with head turns with mild LOB with self recovery; use of  buoyancy of water for support with these balance exercises to be performed safely without risk of falling  Squats x 10 reps for bil. LE strengthening with UE support on pool wall using viscosity of water for resistance  Ai Chi postures for core stabilization, strengthening and balance training - 10 reps each with no LOB  Pt requires buoyancy of water for support with balance exs. and viscosity of water for resistance for strengthening exs.  Water provides safe environment to perform balance exercises safely without risk of fall without UE support                                         PT Short Term Goals - 11/14/18 1323      PT SHORT TERM GOAL #1   Title  Pt will be IND with HEP to improve deficits listed above. TARGET DATE FOR ALL STGS: 11/16/18    Baseline  1/28: Pt is IND with HEP.     Status  Achieved      PT SHORT TERM GOAL #2   Title  Pt will begin aquatic PT to improve strength and balance and QOL.     Baseline  1/28: Pt has confirmed appt for aquatic PT.     Status  Achieved      PT SHORT TERM GOAL #3   Title  Pt will perform floor<>stand txfs with S, with UE support to improve safety.    Baseline  1/28: Pt able to perform floor<> stand with S for safety and SUE support.     Status  Achieved        PT Long Term Goals - 12/15/18 1019      PT LONG TERM GOAL #1   Title  Pt will be independent with pool HEP in order to indicate improved balance and strength.  (Target Date: 12/29/18)    Time  2    Period  Weeks    Status  New      PT LONG TERM GOAL #2   Title  Pt will verbalize return to community fitness program and walking program to continue to maintain gains made in therapy.     Time  2    Period  Weeks    Status  New            Plan - 12/21/18 2024    Clinical Impression Statement  Aquatic therapy session focused on establishing exercises for aquatic HEP to be continued independently upon D/C from PT after 1 more session.   Pt able to maintain balance with Ai Chi exercises much better than  performance in previous session.  Pt able to perform strengthening exercises with no LOB with use of 1 UE support on pool edge and min c/o fatigue with exercises at end of session.      Rehab Potential  Good    PT Frequency  1x / week    PT Duration  2 weeks    PT Treatment/Interventions  ADLs/Self Care Home Management;Gait training;Stair training;Functional mobility training;Therapeutic activities;Therapeutic exercise;Balance training;Patient/family education;Neuromuscular re-education;Manual techniques;Taping;Visual/perceptual remediation/compensation;Aquatic Therapy    PT Next Visit Plan  Has HEP for pool goal and community fitness goals left.  Vinnie Level she needs balance and ankle strengthening in pool.     Consulted and Agree with Plan of Care  Patient       Patient will benefit from skilled therapeutic intervention in order to improve the following deficits and impairments:  Decreased balance, Decreased activity tolerance, Decreased mobility, Decreased strength  Visit Diagnosis: Other abnormalities of gait and mobility  Unsteadiness on feet  Muscle weakness (generalized)     Problem List Patient Active Problem List   Diagnosis Date Noted  . Raynaud disease 10/06/2018  . Encounter for monitoring long-term proton pump inhibitor therapy 08/28/2018  . White matter disease 02/20/2018  . Osteoporosis   . Hypercalcemia 01/17/2018  . Essential hypertension 01/16/2018  . Hypothyroidism 01/16/2018  . Depression with anxiety 01/16/2018  . Traumatic brain injury with loss of consciousness ([place]) 01/16/2018  . Hyperlipidemia     Laryn Venning, [place], PT 12/21/2018, 8:34 PM  [facility] [address] [place], [place], [address] Phone: [phone]   Fax:  [phone]  Name: Chayna Surratt MRN: [id number] Date of Birth: Apr 14, 1951

## 2018-12-27 ENCOUNTER — Other Ambulatory Visit: Payer: Self-pay

## 2018-12-27 ENCOUNTER — Ambulatory Visit: Payer: Medicare Other | Admitting: Physical Therapy

## 2018-12-27 DIAGNOSIS — R2689 Other abnormalities of gait and mobility: Secondary | ICD-10-CM | POA: Diagnosis not present

## 2018-12-27 DIAGNOSIS — M6281 Muscle weakness (generalized): Secondary | ICD-10-CM | POA: Diagnosis not present

## 2018-12-27 DIAGNOSIS — R2681 Unsteadiness on feet: Secondary | ICD-10-CM | POA: Diagnosis not present

## 2018-12-28 NOTE — Therapy (Signed)
[facility] [address] [place], [place], [address] Phone: [phone]   Fax:  [phone]  Physical Therapy Treatment  Patient Details  Name: Abigail Michael MRN: [id number] Date of Birth: 07-21-1951 Referring Provider (PT): Dr. Raoul Pitch   Encounter Date: 12/27/2018  PT End of Session - 12/28/18 2051    Visit Number  17    Number of Visits  22    Date for PT Re-Evaluation  12/29/18    Authorization Type  Medicare + BCBS    PT Start Time  1455    PT Stop Time  1545    PT Time Calculation (min)  50 min    Activity Tolerance  Patient tolerated treatment well    Behavior During Therapy  [facility] for tasks assessed/performed       Past Medical History:  Diagnosis Date  . Acid reflux   . 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 Chicken pox   . Constipation   . Depression   . Frequent headaches   . GERD (gastroesophageal reflux disease)   . 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 Hyperlipidemia   . Hypertension   . MS (multiple sclerosis) ([place]) 12/15/2016   possible dx. many MRI w/ positive white matter abnormality. MRI reported stable since 2015. mild progressive  Cognitive loss  . Obstructive sleep apnea    on prior neuro notes 10/2016; no results.   . Osteoporosis    had been on reclast  . Post concussion syndrome 10/2013   ongoing issues with concentration, memory loss, lability; EEG completed and normal.   . TBI (traumatic brain injury) ([place]) 2014   fall at [facility], hit head off of floor. No bleed, "concussion". Seen neurology since for focus and word finding.   Marland Kitchen TIA (transient ischemic attack) 11/30/2016   MRI normal. No lateralization. Speech affected and weakness during a hypotensive episdoe.   . Urinary incontinence     Past Surgical History:  Procedure Laterality Date  . BACK SURGERY  02/2015   herniated disc L4/5/6  . BREAST BIOPSY  1988   biopsy  . LAPAROSCOPIC  HYSTERECTOMY     partial   . SHOULDER ARTHROSCOPY W/ LABRAL REPAIR Left 02/13/2016   left shoulder  . Study: cognitive eval  06/23/2016   mild cog-linguistic impairment, difficulty with verbal fluency  . Study: EEG   04/12/2015   Studies: EEG completed and normal.   . Study: MRI  04/28/2016   Impression: numerous non-enhancing flair hyperintense lesions w/in the brain .Suspicious for demyelinating disease such as lyme or MS. Her Lyme test were negative.     There were no vitals filed for this visit.  Subjective Assessment - 12/28/18 2047    Subjective  Pt reports she is doing much better - says balance is better and she feels better emotionally; pt presents for aquatic therapy at [facility]     Pertinent History  depression, headaches, HTN, MS (possible dx), OSA, post-consussion syndrome, TBI, TIA    Patient Stated Goals  improve balance, feel more steady.    Currently in Pain?  No/denies          Aquatic therapy - pool temp 87.2 degrees   Patient seen for aquatic therapy today.  Treatment took place in water 2.5-4 feet deep depending upon activity.  Pt entered the pool via Step negotiation modified independently with use of hand rails.  Pt performed bil. Hip strengthening exercises  in standing with UE support on pool wall - hip flexion, extension, abduction and adduction with use of Ankle buoyant cuff weight for increased resistance with eccentric motion - 10 reps each   Pt performed heel raises x 10 reps; marching in place 10 reps Marching forwards and backwards approx. 6' across pool without UE support  Pt performed water walking in 4' water depth - forwards, backwards and sideways - approx. 50' x 4 reps each;  Pt performed standing with feet apart with head turns  - EO and then with EC; feet together with EO with horizontal and vertical head turns, then with EC with head turns with mild LOB with self recovery; use of buoyancy of water for support with these balance exercises to be  performed safely without risk of falling  Squats x 10 reps for bil. LE strengthening with UE support on pool wall using viscosity of water for resistance  Ai Chi postures for core stabilization, strengthening and balance training - Gathering, welcoming and accepting postures - 10 reps each with mild LOB  Pt requires buoyancy of water for support with balance exs. and viscosity of water for resistance for strengthening exs.  Water provides safe environment to perform balance exercises safely without risk of fall without UE support                                        PT Short Term Goals - 11/14/18 1323      PT SHORT TERM GOAL #1   Title  Pt will be IND with HEP to improve deficits listed above. TARGET DATE FOR ALL STGS: 11/16/18    Baseline  1/28: Pt is IND with HEP.     Status  Achieved      PT SHORT TERM GOAL #2   Title  Pt will begin aquatic PT to improve strength and balance and QOL.     Baseline  1/28: Pt has confirmed appt for aquatic PT.     Status  Achieved      PT SHORT TERM GOAL #3   Title  Pt will perform floor<>stand txfs with S, with UE support to improve safety.    Baseline  1/28: Pt able to perform floor<> stand with S for safety and SUE support.     Status  Achieved        PT Long Term Goals - 12/28/18 2058      PT LONG TERM GOAL #1   Title  Pt will be independent with pool HEP in order to indicate improved balance and strength.  (Target Date: 01-29-19)    Baseline  12/12/18: doing the HEP and walking program. Still challenged by current program.     Target Date  01/29/19      PT LONG TERM GOAL #2   Title  Pt will verbalize return to community fitness program and walking program to continue to maintain gains made in therapy.     Status  On-going    Target Date  01/29/19      PT LONG TERM GOAL #3   Title  patient to improve FGA by >/= 26/30 demonstrating improved functional mobility and reduced fall risk    Status  Partially  Met      PT LONG TERM GOAL #6   Title  patient to demonstrate participation in gym/walking program most days of the week    Baseline  Pt reports planning to return to walking in the neighborhood     Status  On-going    Target Date  01/29/19            Plan - 12/28/18 2053    Clinical Impression Statement  Aquatic therapy session focused on water walking in various directions with incorporation of perturbations with water current with stop/start for dynamic balance training.  Pt noted to have much improved balance with performance of Ai Chi postures, demonstrating improved trunk control and core stabilization. Pt demonstrated good endurance with minimal rest breaks needed during 45" treatment session.                                                                                                 Rehab Potential  Good    PT Frequency  1x / week    PT Duration  4 weeks    PT Treatment/Interventions  ADLs/Self Care Home Management;Gait training;Stair training;Functional mobility training;Therapeutic activities;Therapeutic exercise;Balance training;Patient/family education;Neuromuscular re-education;Manual techniques;Taping;Visual/perceptual remediation/compensation;Aquatic Therapy    PT Next Visit Plan  Has HEP for pool goal and community fitness goals left.  Vinnie Level she needs balance and ankle strengthening in pool.     PT Home Exercise Plan  [id number]     Consulted and Agree with Plan of Care  Patient       Patient will benefit from skilled therapeutic intervention in order to improve the following deficits and impairments:  Decreased balance, Decreased activity tolerance, Decreased mobility, Decreased strength  Visit Diagnosis: Other abnormalities of gait and mobility - Plan: PT plan of care cert/re-cert  Unsteadiness on feet - Plan: PT plan of care cert/re-cert  Muscle weakness (generalized) - Plan: PT plan of care cert/re-cert     Problem List Patient Active Problem List    Diagnosis Date Noted  . Raynaud disease 10/06/2018  . Encounter for monitoring long-term proton pump inhibitor therapy 08/28/2018  . White matter disease 02/20/2018  . Osteoporosis   . Hypercalcemia 01/17/2018  . Essential hypertension 01/16/2018  . Hypothyroidism 01/16/2018  . Depression with anxiety 01/16/2018  . Traumatic brain injury with loss of consciousness ([place]) 01/16/2018  . Hyperlipidemia     Alda Lea, PT 12/28/2018, 9:07 PM  [facility] [address] [place], [place], [address] Phone: [phone]   Fax:  [phone]  Name: Abigail Michael MRN: [id number] Date of Birth: Jul 26, 1951

## 2019-01-04 ENCOUNTER — Other Ambulatory Visit: Payer: Self-pay | Admitting: Family Medicine

## 2019-01-10 ENCOUNTER — Ambulatory Visit: Payer: Self-pay | Admitting: Gastroenterology

## 2019-01-10 ENCOUNTER — Ambulatory Visit: Payer: Medicare Other | Admitting: Physical Therapy

## 2019-01-11 ENCOUNTER — Ambulatory Visit: Payer: Self-pay | Admitting: Gastroenterology

## 2019-01-15 ENCOUNTER — Other Ambulatory Visit: Payer: Self-pay | Admitting: Family Medicine

## 2019-02-02 ENCOUNTER — Encounter: Payer: Self-pay | Admitting: Gastroenterology

## 2019-02-02 ENCOUNTER — Telehealth (INDEPENDENT_AMBULATORY_CARE_PROVIDER_SITE_OTHER): Payer: Medicare Other | Admitting: Gastroenterology

## 2019-02-02 ENCOUNTER — Other Ambulatory Visit: Payer: Self-pay

## 2019-02-02 VITALS — Ht 62.0 in | Wt 165.0 lb

## 2019-02-02 DIAGNOSIS — Z7189 Other specified counseling: Secondary | ICD-10-CM

## 2019-02-02 DIAGNOSIS — K449 Diaphragmatic hernia without obstruction or gangrene: Secondary | ICD-10-CM | POA: Diagnosis not present

## 2019-02-02 DIAGNOSIS — Z1211 Encounter for screening for malignant neoplasm of colon: Secondary | ICD-10-CM | POA: Diagnosis not present

## 2019-02-02 DIAGNOSIS — Z5181 Encounter for therapeutic drug level monitoring: Secondary | ICD-10-CM | POA: Diagnosis not present

## 2019-02-02 DIAGNOSIS — Z79899 Other long term (current) drug therapy: Secondary | ICD-10-CM | POA: Diagnosis not present

## 2019-02-02 DIAGNOSIS — K219 Gastro-esophageal reflux disease without esophagitis: Secondary | ICD-10-CM | POA: Diagnosis not present

## 2019-02-02 DIAGNOSIS — Z8719 Personal history of other diseases of the digestive system: Secondary | ICD-10-CM

## 2019-02-02 NOTE — Progress Notes (Signed)
Chief Complaint: GERD, hiatal hernia  Referring Provider:     Ma Hillock, DO   HPI:    Due to current restrictions/limitations of in-office visits due to the COVID-19 pandemic, this scheduled clinical appointment was converted to a telehealth consultation via telephone.  -Time of medical discussion: 25 minutes -The patient did consent to this telephone visit and is aware of possible charges through their insurance for this visit.  -Names of all parties present: Abigail Michael (patient), Gerrit Heck, DO, [facility] (physician)  Abigail Michael is a 68 y.o. female with a history of MS, hypertension, hyperlipidemia, TIA, TBI, referred to the Gastroenterology Clinic for evaluation of reflux.  She reports a longstanding history of reflux symptoms (pyrosis, regurgitation, dyspepsia) but with worsening symptoms lately. Had been previously treated in [place] and moved to [place] 2 years ago.   For reflux, she is prescribed Nexium 40 mg daily and Pepcid 20 mg twice daily. Sxs well controlled with dual therapy.  Breakthrough with any missed dose or if single agent. Previously treated with Dexilant and twice daily Zantac.  Otherwise, good p.o. intake, no dysphagia or odynophagia.  Reported history of Barrett's Esophagus on EGD in 2016 (no biopsy report available for review).  No subsequent EGD.  Endoscopic history: -EGD (01/20/2015, Dr. Margy Clarks, [name], [place]): 3-4 cm Hiatal hernia, incompetent LES, short segment Barrett's esophagus. Normal stomach and duodenum. Biopsies taken but not available for review.   Past medical history, past surgical history, social history, family history, medications, and allergies reviewed in the chart and with patient over the phone.  Past Medical History:  Diagnosis Date  . Acid reflux   . 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 Chicken pox   . Constipation   . Depression   . Frequent headaches   . GERD (gastroesophageal  reflux disease)   . 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 Hyperlipidemia   . Hypertension   . MS (multiple sclerosis) ([place]) 12/15/2016   possible dx. many MRI w/ positive white matter abnormality. MRI reported stable since 2015. mild progressive  Cognitive loss  . Obstructive sleep apnea    on prior neuro notes 10/2016; no results.   . Osteoporosis    had been on reclast  . Post concussion syndrome 10/2013   ongoing issues with concentration, memory loss, lability; EEG completed and normal.   . TBI (traumatic brain injury) ([place]) 2014   fall at [facility], hit head off of floor. No bleed, "concussion". Seen neurology since for focus and word finding.   Marland Kitchen TIA (transient ischemic attack) 11/30/2016   MRI normal. No lateralization. Speech affected and weakness during a hypotensive episdoe.   . Urinary incontinence      Past Surgical History:  Procedure Laterality Date  . BACK SURGERY  02/2015   herniated disc L4/5/6  . BREAST BIOPSY  1988   biopsy  . LAPAROSCOPIC HYSTERECTOMY     partial   . SHOULDER ARTHROSCOPY W/ LABRAL REPAIR Left 02/13/2016   left shoulder  . Study: cognitive eval  06/23/2016   mild cog-linguistic impairment, difficulty with verbal fluency  . Study: EEG   04/12/2015   Studies: EEG completed and normal.   . Study: MRI  04/28/2016   Impression: numerous non-enhancing flair hyperintense lesions w/in the brain .Suspicious for demyelinating disease such as lyme or MS. Her Lyme test were negative.  Family History  Problem Relation Age of Onset  . Lung cancer Mother   . Alcohol abuse Father   . Throat cancer Father   . Lung cancer Father   . Depression Father   . Heart attack Father   . Breast cancer Sister   . Alcohol abuse Brother   . Diabetes Brother   . Mental illness Sister   . Stroke Daughter    Social History   Tobacco Use  . Smoking status: Former Smoker    Packs/day: 1.00    Years: 19.00    Pack years: 19.00    Last attempt to quit: 2012     Years since quitting: 8.2  . Smokeless tobacco: Never Used  . Tobacco comment: quit 6 years ago  Substance Use Topics  . Alcohol use: Yes    Alcohol/week: 1.0 standard drinks    Types: 1 Glasses of wine per week    Comment: twice a month  . Drug use: Never   Current Outpatient Medications  Medication Sig Dispense Refill  . amLODipine (NORVASC) 10 MG tablet TAKE 1 TABLET BY MOUTH EVERY DAY 90 tablet 0  . aspirin EC 81 MG tablet Take 81 mg by mouth daily.    Marland Kitchen atorvastatin (LIPITOR) 40 MG tablet Take 1 tablet (40 mg total) by mouth daily. 90 tablet 3  . dalfampridine 10 MG TB12 Take 10 mg by mouth 2 (two) times daily.    Marland Kitchen donepezil (ARICEPT) 5 MG tablet Take 5 mg by mouth at bedtime.    Marland Kitchen EPINEPHrine 0.3 mg/0.3 mL IJ SOAJ injection Inject 0.3 mg into the skin as needed.    Marland Kitchen esomeprazole (NEXIUM) 40 MG capsule Take 1 capsule (40 mg total) by mouth daily at 12 noon. 90 capsule 1  . famotidine (PEPCID) 20 MG tablet Take 1 tablet (20 mg total) by mouth 2 (two) times daily. 180 tablet 3  . FLUoxetine (PROZAC) 40 MG capsule Take 1 capsule (40 mg total) by mouth daily. 90 capsule 1  . levothyroxine (SYNTHROID, LEVOTHROID) 25 MCG tablet TAKE 0.5 TABLETS (12.5 MCG TOTAL) BY MOUTH DAILY BEFORE BREAKFAST. 45 tablet 0  . Magnesium 500 MG TABS Take by mouth.    . TURMERIC PO Take by mouth.     No current facility-administered medications for this visit.    Allergies  Allergen Reactions  . Bee Venom Anaphylaxis  . Amoxicillin Dermatitis    Yeast infection     Review of Systems: All systems reviewed and negative except where noted in HPI.     Physical Exam:    Physical exam not completed due to the nature of this telehealth communication.  Patient was otherwise alert and oriented and well communicative.   ASSESSMENT AND PLAN;   1) GERD 2) Hiatal hernia 3) History of Barrett's Esophagus  68 year old female with longstanding history of reflux, well controlled with dual acid  suppression therapy.  EGD in 2016 with 3 to 4 cm hiatal hernia, incompetent LES, and reported endoscopic appearance of Barrett's esophagus.  No histology for review today.  No subsequent EGD.  Discussed the pathophysiology of reflux at length with the patient today, to include ongoing treatment options to include medical management versus surgical intervention.  Plan for the following:  -Repeat EGD when COVID-19 related restrictions are lifted for confirmation of Barrett's esophagus along with evaluation for extent and severity of disease.  Plan to evaluate size of hernia and LES laxity at that time with further recommendations regarding operative opportunities -  Resume current aspiration therapy -Resume antireflux lifestyle measures  4) CRC screening: Not clear that she has had a colonoscopy in the past.  Unable to review this today due to phone connectivity issues.  Will review on follow-up appointment and if appropriate, plan for colonoscopy for routine CRC screening.  5) Chronic acid suppression therapy: I have reviewed the indications, risks, and benefits of PPI therapy with the patient today. I have discussed studies that raise ? of increased osteoporosis, dementia, and kidney failure and explained that these studies show very weak associations of unclear significance and not clear cause and effect. We did discuss the potential for vitamin malabsorption, to include magnesium (very rare), calcium (easily modifiable with Calcium Citrate supplement), vitamin B12 (again, correctable with oral B12 supplement), and iron (although rarely clinically significant outside patients who require iron supplementation previously), and can monitor each of these periodically with routine labs. We have agreed to continue PPI treatment in this case.  The indications, risks, and benefits of EGD were explained to the patient in detail. Risks include but are not limited to bleeding, perforation, adverse reaction to  medications, and cardiopulmonary compromise. Sequelae include but are not limited to the possibility of surgery, hositalization, and mortality. The patient verbalized understanding and wished to proceed. All questions answered, referred to scheduler. Further recommendations pending results of the exam.      Dominic Pea Constanza Mincy, DO, FACG  02/02/2019, 1:19 PM   Kuneff, Renee A, DO

## 2019-02-02 NOTE — Patient Instructions (Addendum)
You will need an Endoscopy scheduled after COVID19 is over, we will contact you when this can be scheduled    Resume current aspiration therapy    Resume antireflux lifestyle measures    Gastroesophageal Reflux Disease, Adult Gastroesophageal reflux (GER) happens when acid from the stomach flows up into the tube that connects the mouth and the stomach (esophagus). Normally, food travels down the esophagus and stays in the stomach to be digested. However, when a person has GER, food and stomach acid sometimes move back up into the esophagus. If this becomes a more serious problem, the person may be diagnosed with a disease called gastroesophageal reflux disease (GERD). GERD occurs when the reflux:  Happens often.  Causes frequent or severe symptoms.  Causes problems such as damage to the esophagus. When stomach acid comes in contact with the esophagus, the acid may cause soreness (inflammation) in the esophagus. Over time, GERD may create small holes (ulcers) in the lining of the esophagus. What are the causes? This condition is caused by a problem with the muscle between the esophagus and the stomach (lower esophageal sphincter, or LES). Normally, the LES muscle closes after food passes through the esophagus to the stomach. When the LES is weakened or abnormal, it does not close properly, and that allows food and stomach acid to go back up into the esophagus. The LES can be weakened by certain dietary substances, medicines, and medical conditions, including:  Tobacco use.  Pregnancy.  Having a hiatal hernia.  Alcohol use.  Certain foods and beverages, such as coffee, chocolate, onions, and peppermint. What increases the risk? You are more likely to develop this condition if you:  Have an increased body weight.  Have a connective tissue disorder.  Use NSAID medicines. What are the signs or symptoms? Symptoms of this condition include:  Heartburn.  Difficult or painful  swallowing.  The feeling of having a lump in the throat.  Abitter taste in the mouth.  Bad breath.  Having a large amount of saliva.  Having an upset or bloated stomach.  Belching.  Chest pain. Different conditions can cause chest pain. Make sure you see your health care provider if you experience chest pain.  Shortness of breath or wheezing.  Ongoing (chronic) cough or a night-time cough.  Wearing away of tooth enamel.  Weight loss. How is this diagnosed? Your health care provider will take a medical history and perform a physical exam. To determine if you have mild or severe GERD, your health care provider may also monitor how you respond to treatment. You may also have tests, including:  A test to examine your stomach and esophagus with a small camera (endoscopy).  A test thatmeasures the acidity level in your esophagus.  A test thatmeasures how much pressure is on your esophagus.  A barium swallow or modified barium swallow test to show the shape, size, and functioning of your esophagus. How is this treated? The goal of treatment is to help relieve your symptoms and to prevent complications. Treatment for this condition may vary depending on how severe your symptoms are. Your health care provider may recommend:  Changes to your diet.  Medicine.  Surgery. Follow these instructions at home: Eating and drinking   Follow a diet as recommended by your health care provider. This may involve avoiding foods and drinks such as: ? Coffee and tea (with or without caffeine). ? Drinks that containalcohol. ? Energy drinks and sports drinks. ? Carbonated drinks or sodas. ? Chocolate  and cocoa. ? Peppermint and mint flavorings. ? Garlic and onions. ? Horseradish. ? Spicy and acidic foods, including peppers, chili powder, curry powder, vinegar, hot sauces, and barbecue sauce. ? Citrus fruit juices and citrus fruits, such as oranges, lemons, and limes. ? Tomato-based  foods, such as red sauce, chili, salsa, and pizza with red sauce. ? Fried and fatty foods, such as donuts, french fries, potato chips, and high-fat dressings. ? High-fat meats, such as hot dogs and fatty cuts of red and white meats, such as rib eye steak, sausage, ham, and bacon. ? High-fat dairy items, such as whole milk, butter, and cream cheese.  Eat small, frequent meals instead of large meals.  Avoid drinking large amounts of liquid with your meals.  Avoid eating meals during the 2-3 hours before bedtime.  Avoid lying down right after you eat.  Do not exercise right after you eat. Lifestyle   Do not use any products that contain nicotine or tobacco, such as cigarettes, e-cigarettes, and chewing tobacco. If you need help quitting, ask your health care provider.  Try to reduce your stress by using methods such as yoga or meditation. If you need help reducing stress, ask your health care provider.  If you are overweight, reduce your weight to an amount that is healthy for you. Ask your health care provider for guidance about a safe weight loss goal. General instructions  Pay attention to any changes in your symptoms.  Take over-the-counter and prescription medicines only as told by your health care provider. Do not take aspirin, ibuprofen, or other NSAIDs unless your health care provider told you to do so.  Wear loose-fitting clothing. Do not wear anything tight around your waist that causes pressure on your abdomen.  Raise (elevate) the head of your bed about 6 inches (15 cm).  Avoid bending over if this makes your symptoms worse.  Keep all follow-up visits as told by your health care provider. This is important. Contact a health care provider if:  You have: ? New symptoms. ? Unexplained weight loss. ? Difficulty swallowing or it hurts to swallow. ? Wheezing or a persistent cough. ? A hoarse voice.  Your symptoms do not improve with treatment. Get help right away if  you:  Have pain in your arms, neck, jaw, teeth, or back.  Feel sweaty, dizzy, or light-headed.  Have chest pain or shortness of breath.  Vomit and your vomit looks like blood or coffee grounds.  Faint.  Have stool that is bloody or black.  Cannot swallow, drink, or eat. Summary  Gastroesophageal reflux happens when acid from the stomach flows up into the esophagus. GERD is a disease in which the reflux happens often, causes frequent or severe symptoms, or causes problems such as damage to the esophagus.  Treatment for this condition may vary depending on how severe your symptoms are. Your health care provider may recommend diet and lifestyle changes, medicine, or surgery.  Contact a health care provider if you have new or worsening symptoms.  Take over-the-counter and prescription medicines only as told by your health care provider. Do not take aspirin, ibuprofen, or other NSAIDs unless your health care provider told you to do so.  Keep all follow-up visits as told by your health care provider. This is important. This information is not intended to replace advice given to you by your health care provider. Make sure you discuss any questions you have with your health care provider. Document Released: 07/14/2005 Document Revised: 04/12/2018 Document  Reviewed: 04/12/2018 Elsevier Interactive Patient Education  2019 [place] for choosing [name]   Vito Cirigliano,MD

## 2019-02-06 ENCOUNTER — Ambulatory Visit: Payer: Self-pay | Admitting: Gastroenterology

## 2019-03-06 ENCOUNTER — Encounter: Payer: Self-pay | Admitting: Gastroenterology

## 2019-03-30 ENCOUNTER — Encounter: Payer: Federal, State, Local not specified - PPO | Admitting: Gastroenterology

## 2019-04-01 ENCOUNTER — Other Ambulatory Visit: Payer: Self-pay | Admitting: Family Medicine

## 2019-04-02 NOTE — Telephone Encounter (Signed)
Please call patient. I have received refill request on patient's amlodipine and pt is due for 6 mos follow up on her chronic medical conditions. Please schedule ASAP to ensure she does not run out of medicine, need her on her medication when she presents to her appointment in order to know if treatment is effective.

## 2019-04-03 NOTE — Telephone Encounter (Signed)
Pt was called and she is in [place] visiting family and will not return to [facility] until July 16th. She does think she has enough medication to last until appt. She will call back if she is going to run out. Appt scheduled for July 20th after she returns

## 2019-04-05 ENCOUNTER — Telehealth: Payer: Self-pay | Admitting: Family Medicine

## 2019-04-05 MED ORDER — ATORVASTATIN CALCIUM 40 MG PO TABS: 40.0000 mg | ORAL_TABLET | Freq: Every day | ORAL | 0 refills | Status: DC

## 2019-04-05 MED ORDER — AMLODIPINE BESYLATE 10 MG PO TABS: 10.0000 mg | ORAL_TABLET | Freq: Every day | ORAL | 0 refills | Status: DC

## 2019-04-05 NOTE — Telephone Encounter (Signed)
Patient called requesting refills on Lipitor and Norvasc as she will run out before appt and returning home  [facility] [place] [phone] Last appt 11/20/2018 NOV 05/07/2019 Last filled: Lipitor 02/17/18 #90 x3 refills  Norvasc 01/04/19 #90 Please advise if this is okay. Pharmacy has been changed.

## 2019-04-05 NOTE — Telephone Encounter (Signed)
See previous phone note.  

## 2019-04-05 NOTE — Addendum Note (Signed)
Addended by: Caroll Rancher L on: 04/05/2019 11:41 AM   Modules accepted: Orders

## 2019-04-05 NOTE — Telephone Encounter (Signed)
Patient called requesting refills on Lipitor and Norvasc  [facility] [phone]  Thank you

## 2019-04-23 ENCOUNTER — Telehealth: Payer: Self-pay

## 2019-04-23 NOTE — Telephone Encounter (Signed)
Copied from [place] [id number]. Topic: General - Other >> Apr 23, 2019 11:26 AM Abigail Michael wrote: Reason for CRM:   refill request for her Fluoxetine.  She is out of town, on vacation. [facility] [place]  [phone]  Address:  [place]  RF request for Fluoxetine  LOV: 11/20/2018 acute, 08/28/2018 for [facility]  Next ov: 05/07/2019 Last written: 08/29/2018 #90 x1 refill

## 2019-04-25 MED ORDER — FLUOXETINE HCL 40 MG PO CAPS: 40.0000 mg | ORAL_CAPSULE | Freq: Every day | ORAL | 0 refills | Status: DC

## 2019-04-25 NOTE — Addendum Note (Signed)
Addended by: Caroll Rancher L on: 04/25/2019 08:20 AM   Modules accepted: Orders

## 2019-04-25 NOTE — Telephone Encounter (Signed)
Has this been handled? If not please send a 30 day script to get her to her appt.

## 2019-04-25 NOTE — Telephone Encounter (Signed)
Pts medication was sent to pharmacy.

## 2019-04-26 ENCOUNTER — Other Ambulatory Visit: Payer: Self-pay

## 2019-04-26 NOTE — Progress Notes (Signed)
Pt called and pharmacy never received Prozac RX. Called pharmacy in [place] and [place] with RX details and to fill #30 with 0 refills. Pt was called and given information

## 2019-05-07 ENCOUNTER — Encounter: Payer: Self-pay | Admitting: Family Medicine

## 2019-05-07 ENCOUNTER — Other Ambulatory Visit: Payer: Self-pay | Admitting: Family Medicine

## 2019-05-07 ENCOUNTER — Ambulatory Visit (INDEPENDENT_AMBULATORY_CARE_PROVIDER_SITE_OTHER): Payer: Medicare Other | Admitting: Family Medicine

## 2019-05-07 ENCOUNTER — Other Ambulatory Visit: Payer: Self-pay

## 2019-05-07 VITALS — BP 116/79 | HR 73 | Temp 98.1°F | Resp 16 | Ht 62.0 in | Wt 175.0 lb

## 2019-05-07 DIAGNOSIS — S069X9S Unspecified intracranial injury with loss of consciousness of unspecified duration, sequela: Secondary | ICD-10-CM | POA: Diagnosis not present

## 2019-05-07 DIAGNOSIS — E039 Hypothyroidism, unspecified: Secondary | ICD-10-CM | POA: Diagnosis not present

## 2019-05-07 DIAGNOSIS — I10 Essential (primary) hypertension: Secondary | ICD-10-CM

## 2019-05-07 DIAGNOSIS — E782 Mixed hyperlipidemia: Secondary | ICD-10-CM

## 2019-05-07 DIAGNOSIS — K219 Gastro-esophageal reflux disease without esophagitis: Secondary | ICD-10-CM | POA: Insufficient documentation

## 2019-05-07 DIAGNOSIS — Z1159 Encounter for screening for other viral diseases: Secondary | ICD-10-CM

## 2019-05-07 DIAGNOSIS — N3281 Overactive bladder: Secondary | ICD-10-CM

## 2019-05-07 DIAGNOSIS — M810 Age-related osteoporosis without current pathological fracture: Secondary | ICD-10-CM | POA: Diagnosis not present

## 2019-05-07 DIAGNOSIS — R9082 White matter disease, unspecified: Secondary | ICD-10-CM

## 2019-05-07 DIAGNOSIS — F418 Other specified anxiety disorders: Secondary | ICD-10-CM | POA: Diagnosis not present

## 2019-05-07 DIAGNOSIS — I7300 Raynaud's syndrome without gangrene: Secondary | ICD-10-CM | POA: Diagnosis not present

## 2019-05-07 DIAGNOSIS — Z1231 Encounter for screening mammogram for malignant neoplasm of breast: Secondary | ICD-10-CM

## 2019-05-07 LAB — COMPREHENSIVE METABOLIC PANEL
ALT: 16 U/L (ref 0–35)
AST: 13 U/L (ref 0–37)
Albumin: 4.7 g/dL (ref 3.5–5.2)
Alkaline Phosphatase: 108 U/L (ref 39–117)
BUN: 22 mg/dL (ref 6–23)
CO2: 26 mEq/L (ref 19–32)
Calcium: 10.9 mg/dL — ABNORMAL HIGH (ref 8.4–10.5)
Chloride: 104 mEq/L (ref 96–112)
Creatinine, Ser: 1.05 mg/dL (ref 0.40–1.20)
GFR: 52.03 mL/min — ABNORMAL LOW (ref >60.00)
Glucose, Bld: 101 mg/dL — ABNORMAL HIGH (ref 70–99)
Potassium: 4.8 mEq/L (ref 3.5–5.1)
Sodium: 139 mEq/L (ref 135–145)
Total Bilirubin: 0.5 mg/dL (ref 0.2–1.2)
Total Protein: 6.9 g/dL (ref 6.0–8.3)

## 2019-05-07 LAB — T4, FREE: Free T4: 0.83 ng/dL (ref 0.60–1.60)

## 2019-05-07 LAB — TSH: TSH: 3.57 u[IU]/mL (ref 0.35–4.50)

## 2019-05-07 LAB — VITAMIN D 25 HYDROXY (VIT D DEFICIENCY, FRACTURES): VITD: 44.69 ng/mL (ref 30.00–100.00)

## 2019-05-07 MED ORDER — AMLODIPINE BESYLATE 10 MG PO TABS: 10.0000 mg | ORAL_TABLET | Freq: Every day | ORAL | 1 refills | Status: DC

## 2019-05-07 MED ORDER — FLUOXETINE HCL 40 MG PO CAPS: 40.0000 mg | ORAL_CAPSULE | Freq: Every day | ORAL | 1 refills | Status: DC

## 2019-05-07 MED ORDER — ESOMEPRAZOLE MAGNESIUM 40 MG PO CPDR: 40.0000 mg | DELAYED_RELEASE_CAPSULE | Freq: Every day | ORAL | 1 refills | Status: DC

## 2019-05-07 MED ORDER — EPINEPHRINE 0.3 MG/0.3ML IJ SOAJ: 0.3000 mg | INTRAMUSCULAR | 1 refills | Status: DC | PRN

## 2019-05-07 MED ORDER — ATORVASTATIN CALCIUM 40 MG PO TABS: 40.0000 mg | ORAL_TABLET | Freq: Every day | ORAL | 3 refills | Status: DC

## 2019-05-07 NOTE — Patient Instructions (Addendum)
Pleasure to see you today.  I am so glad you are doing well.  I have refilled your meds.  I will call you with results of your labs.

## 2019-05-07 NOTE — Telephone Encounter (Signed)
Patient requesting EpiPen to be renewed and sent to [place] please.

## 2019-05-07 NOTE — Progress Notes (Signed)
Patient ID: Abigail Michael, female  DOB: 31-Jan-1951, 68 y.o.   MRN: [id number] Patient Care Team    Relationship Specialty Notifications Start End  Ma Hillock, DO PCP - General Family Medicine  01/16/18   Sharlene Dory, MD Referring Physician Neurology  08/28/18     Chief Complaint  Patient presents with   Hypertension    States that she has been doing great. No complaints.     Subjective:  Abigail Michael is a 68 y.o.  female present for follow up  Depression with anxiety Doing well on prozac.She Has had   appointments with Dr. [name] and feels it is very helpful.  Prior note:  Has been on prozac for a little over a year after CVA. She reports she feels her depression and anxiety are worsening. She has had many life changes. Moved from [place] last year to be close to family. Currently living with her daughter and enjoying being close to her family. Having trouble adjusting after retiring, moving and TBI last year. She denies SI or HI. Tried in the past: celexa and effexor.   Essential hypertension/HLD/morbid obesity/h/o TIA: Pt reports compliance with amlodipine 10 mg QD. Blood pressures ranges at home are not routinely checked. Patient denies chest pain, shortness of breath, dizziness or lower extremity edema.   Pt takes a  daily baby ASA. Pt is  prescribed statin. H/O TIA.  Diet: Low sodium Exercise: not as routinely, but trying to get back into it.  RF: HTN, HLD, Obesity, h/o CVA, FHX MI  osteoporosis:  Pt reports she had reclast in the past.  GERD/PPI:  nexium and pepcid  Hypothyroidism:  12.5 mcg levothyroxine.   Depression screen [facility] 2/9 05/07/2019 08/28/2018 02/17/2018 01/16/2018  Decreased Interest 0 0 0 2  Down, Depressed, Hopeless 0 0 0 2  PHQ - 2 Score 0 0 0 4  Altered sleeping - 0 0 2  Tired, decreased energy - 0 0 -  Change in appetite - 0 0 1  Feeling bad or failure about yourself  - 0 0 2  Trouble concentrating - 2 0 2  Moving slowly or  fidgety/restless - 0 0 2  Suicidal thoughts - 0 0 0  PHQ-9 Score - 2 0 13  Difficult doing work/chores - Somewhat difficult - -   GAD 7 : Generalized Anxiety Score 05/07/2019 08/28/2018 01/16/2018  Nervous, Anxious, on Edge 0 2 1  Control/stop worrying 0 2 2  Worry too much - different things 0 2 2  Trouble relaxing 0 0 2  Restless 0 0 2  Easily annoyed or irritable 0 2 2  Afraid - awful might happen 0 0 2  Total GAD 7 Score 0 8 13  Anxiety Difficulty - Somewhat difficult -       Fall Risk  05/07/2019 08/28/2018 01/16/2018  Falls in the past year? 0 0 Yes  Comment - - Jan 2016  Number falls in past yr: - 0 1  Injury with Fall? 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 many MRI w/ positive white matter abnormality. MRI reported stable since 2015. mild progressive  Cognitive loss   Obstructive sleep apnea    on prior neuro notes 10/2016; no results.    Osteoporosis    had been on reclast   Post concussion syndrome 10/2013   ongoing issues with concentration, memory loss, lability; EEG completed and normal.    TBI (traumatic brain injury) ([place]) 2014   fall at [facility], hit head off of floor. No bleed, "concussion". Seen neurology since for focus and word  finding.    TIA (transient ischemic attack) 11/30/2016   MRI normal. No lateralization. Speech affected and weakness during a hypotensive episdoe.    Urinary incontinence    Allergies  Allergen Reactions   Bee Venom Anaphylaxis   Amoxicillin Dermatitis    Yeast infection   Past Surgical History:  Procedure Laterality Date   BACK SURGERY  02/2015   herniated disc L4/5/6   BREAST BIOPSY  1988   biopsy   LAPAROSCOPIC HYSTERECTOMY     partial    SHOULDER ARTHROSCOPY W/ LABRAL REPAIR Left 02/13/2016   left shoulder   Study: cognitive eval  06/23/2016   mild cog-linguistic impairment, difficulty with verbal fluency   Study: EEG   04/12/2015   Studies: EEG completed and normal.    Study: MRI  04/28/2016   Impression: numerous non-enhancing flair hyperintense lesions w/in the brain .Suspicious for demyelinating disease such as lyme or MS. Her Lyme test were negative.    Family History  Problem Relation Age of Onset   Lung cancer Mother    Alcohol abuse Father    Throat cancer Father    Lung cancer Father    Depression Father    Heart attack Father    Breast cancer Sister    Alcohol abuse Brother    Diabetes Brother    Mental illness Sister    Stroke Daughter    Social History   Socioeconomic History   Marital status: Single    Spouse name: Not on file   Number of children: Not on file   Years of education: Not on file   Highest education level: Not on file  Occupational History   Occupation: retired  Scientist, product/process development strain: Not on file   Food insecurity    Worry: Not on file    Inability: Not on Lexicographer needs    Medical: Not on file    Non-medical: Not on file  Tobacco Use   Smoking status: Former Smoker    Packs/day: 1.00    Years: 19.00    Pack years: 19.00    Quit date: 2012    Years since quitting: 8.5   Smokeless tobacco: Never Used   Tobacco comment: quit 6 years ago  Substance and  Sexual Activity   Alcohol use: Yes    Alcohol/week: 1.0 standard drinks    Types: 1 Glasses of wine per week    Comment: twice a month   Drug use: Never   Sexual activity: Not Currently    Partners: Male  Lifestyle   Physical activity    Days per week: Not  on file    Minutes per session: Not on file   Stress: Not on file  Relationships   Social connections    Talks on phone: Not on file    Gets together: Not on file    Attends religious service: Not on file    Active member of club or organization: Not on file    Attends meetings of clubs or organizations: Not on file    Relationship status: Not on file   Intimate partner violence    Fear of current or ex partner: Not on file    Emotionally abused: Not on file    Physically abused: Not on file    Forced sexual activity: Not on file  Other Topics Concern   Not on file  Social History Narrative   Single. Lived in [place] area, moved to [place] to be close to her daughters 2018. Lives with her daughter.   Some college, worked as Programmer, systems. Retired.    Social drinker. Former smoker.   Exercises routinely.    Wears dentures.    Drinks caffeine, takes a daily vitamin.   Smoke alarm in the home. Wears her seatbelt.    Feels safe in her relationships.    Allergies as of 05/07/2019      Reactions   Bee Venom Anaphylaxis   Amoxicillin Dermatitis   Yeast infection      Medication List       Accurate as of May 07, 2019  9:42 AM. If you have any questions, ask your nurse or doctor.        STOP taking these medications   Magnesium 500 MG Tabs Stopped by: Howard Pouch, DO     TAKE these medications   amLODipine 10 MG tablet Commonly known as: NORVASC Take 1 tablet (10 mg total) by mouth daily.   aspirin EC 81 MG tablet Take 81 mg by mouth daily.   atorvastatin 40 MG tablet Commonly known as: LIPITOR Take 1 tablet (40 mg total) by mouth daily.   dalfampridine 10 MG Tb12 Take 10 mg by mouth 2 (two) times  daily. What changed: Another medication with the same name was removed. Continue taking this medication, and follow the directions you see here. Changed by: Howard Pouch, DO   donepezil 5 MG tablet Commonly known as: ARICEPT Take 5 mg by mouth at bedtime. What changed: Another medication with the same name was removed. Continue taking this medication, and follow the directions you see here. Changed by: Howard Pouch, DO   EPINEPHrine 0.3 mg/0.3 mL Soaj injection Commonly known as: EPI-PEN Inject 0.3 mg into the skin as needed.   esomeprazole 40 MG capsule Commonly known as: NEXIUM Take 1 capsule (40 mg total) by mouth daily at 12 noon.   famotidine 20 MG tablet Commonly known as: PEPCID Take 1 tablet (20 mg total) by mouth 2 (two) times daily.   FLUoxetine 40 MG capsule Commonly known as: PROZAC Take 1 capsule (40 mg total) by mouth daily.   GLUCOSAMINE CHONDR COMPLEX PO Take 1,200 mg by mouth daily.   levothyroxine 25 MCG tablet Commonly known as: SYNTHROID TAKE 0.5 TABLETS (12.5 MCG TOTAL) BY MOUTH DAILY BEFORE BREAKFAST.   Myrbetriq 25 MG Tb24 tablet Generic drug: mirabegron ER Take 25 mg by mouth daily.   TURMERIC PO Take by mouth.       All past medical history, surgical history, allergies, family history, immunizations andmedications were updated in the EMR today and reviewed under the history  and medication portions of their EMR.     No results found for this or any previous visit (from the past 2160 hour(s)).  Patient was never admitted.   ROS: 14 pt review of systems performed and negative (unless mentioned in an HPI)  Objective: BP 116/79    Pulse 73    Temp 98.1 F (36.7 C)    Resp 16    Ht 5' 2" (1.575 m)    Wt 175 lb (79.4 kg)    LMP 10/18/1990    SpO2 97%    BMI 32.01 kg/m  Gen: Afebrile. No acute distress. Pleasant, mildly obese female.  HENT: AT. [place].  MMM.  Eyes:Pupils Equal Round Reactive to light, Extraocular movements intact,  Conjunctiva  without redness, discharge or icterus. Neck/lymp/endocrine: Supple,no lymphadenopathy, no thyromegaly CV: RRR no murmur, no edema, +2/4 P posterior tibialis pulses Chest: CTAB, no wheeze or crackles Abd: Soft. NTND. BS present. no Masses palpated.  Skin: no rashes, purpura or petechiae.  Neuro:  Normal gait. PERLA. EOMi. Alert. Oriented x3 Psych: Normal affect, dress and demeanor. Normal speech. Normal thought content and judgment.    Assessment/plan: Abigail Michael is a 68 y.o. female present for establishment.  Depression with anxiety -Doing well on prozac 40 mg Qd.  - in a new relationship - Tried in the past: celexa and effexor.  -Follow-up in 6 months long as doing well, sooner if needed  Essential hypertension/morbid obesity/HLD - Bstable. Refills on norvasc 10 mg QD - Comp Met (CMET) - continue statin and ASA F/u 6 mos  Hypercalcemia - Comp Met (CMET) - PTH/CA  Traumatic brain injury with loss of consciousness, sequela (HCC)/White matter disease/Gait instability/TIA - attending speech and feels it is helping Also having difficulty with tripping and leaning with gait.  - est with neuro and PT  Encounter for monitoring long-term proton pump inhibitor therapy/GERD - stable on pepcid and nexium.  - Vitamin D (25 hydroxy)   Osteoporosis, unspecified osteoporosis type, unspecified pathological fracture presence - Vitamin D (25 hydroxy) - had reclast in the past.  - bone density ordered.   Breast cancer screen:  mammogram ordered.   Hep c screen: Hep C collected.   Needs MW scheduled with health coach.    Return in about 24 weeks (around 10/22/2019).  Note is dictated utilizing voice recognition software. Although note has been proof read prior to signing, occasional typographical errors still can be missed. If any questions arise, please do not hesitate to call for verification.  Electronically signed by: Howard Pouch, DO [place]

## 2019-05-07 NOTE — Progress Notes (Signed)
Pre visit review using our clinic review tool, if applicable. No additional management support is needed unless otherwise documented below in the visit note. 

## 2019-05-08 ENCOUNTER — Telehealth: Payer: Self-pay | Admitting: Family Medicine

## 2019-05-08 DIAGNOSIS — N183 Chronic kidney disease, stage 3 unspecified: Secondary | ICD-10-CM | POA: Insufficient documentation

## 2019-05-08 LAB — HEPATITIS C ANTIBODY
Hepatitis C Ab: NONREACTIVE
SIGNAL TO CUT-OFF: 0 (ref <1.00)

## 2019-05-08 LAB — PTH, INTACT AND CALCIUM
Calcium: 10.9 mg/dL — ABNORMAL HIGH (ref 8.6–10.4)
PTH: 31 pg/mL (ref 14–64)

## 2019-05-08 NOTE — Telephone Encounter (Signed)
Please inform patient the following information: 1. To answer her question- I would need to know what med- since she is on about 12 of them, in order to guide her.   2. Her calcium is elevated. It has been slowly rising since April. She should avoid any calcium supplements, including tums if taking. I would also like her to stop her glucosamine and chondroitin complex (these sometimes have calcium in them as well).  Hydrate.   3. Her kidney function has started to decrease since November. I have concerns over one of her MS meds (dalfampridine) could be playing a role, or at least neurology needs to be aware of her decreased function- since that med is tightly renally dosed. Please fax her labs to  Dr. [facility] neurolog[facility] clinic) to  Make her aware of renal changes.   Follow up in 4 weeks for repeat labs (lab appt only)- hydrated, off glucosamine complex. If labs not improved at that time, will need to pursue further evaluation on cause.

## 2019-05-08 NOTE — Telephone Encounter (Signed)
Patient wants to know when is the best time to take her meds  Please advice and contact

## 2019-05-09 NOTE — Telephone Encounter (Signed)
Attempted to call patient, unable to leave message.

## 2019-05-10 NOTE — Telephone Encounter (Signed)
Patient advised of lab results / provider recommendations.  She will stop glucosamine and calcium, and she will hydrate.  She scheduled lab visit 06/07/2019 for repeat labs.    Labs faxed to neurology.    Patient inquiring about when is best to take Lipitor.  Please advise.

## 2019-05-14 NOTE — Telephone Encounter (Signed)
Patient aware.

## 2019-05-14 NOTE — Telephone Encounter (Signed)
She can take anytime- but best before bed.

## 2019-05-30 ENCOUNTER — Telehealth: Payer: Self-pay

## 2019-05-30 NOTE — Telephone Encounter (Signed)
Left message for patient to call back to the office to schedule a pre visit and appt for EGD at [facility];

## 2019-05-30 NOTE — Telephone Encounter (Signed)
Pt stated that her PCP Dr. Raoul Pitch recommended an EGD/col.  Pt reported that she had a colon 4-5 years ago in [place] and will obtain records for review.

## 2019-05-30 NOTE — Telephone Encounter (Signed)
Once medical records are available for review and MD has made advisements -patient will be contacted to schedule appts;   Please add this patient to the recall "list" to ensure the office follows up with this patient

## 2019-05-31 NOTE — Telephone Encounter (Signed)
Once Dr. Lyndel Safe reviews records patient will be called for an appointment.

## 2019-06-05 ENCOUNTER — Ambulatory Visit (INDEPENDENT_AMBULATORY_CARE_PROVIDER_SITE_OTHER): Payer: Medicare Other

## 2019-06-05 ENCOUNTER — Other Ambulatory Visit: Payer: Self-pay

## 2019-06-05 DIAGNOSIS — N183 Chronic kidney disease, stage 3 unspecified: Secondary | ICD-10-CM

## 2019-06-05 LAB — BASIC METABOLIC PANEL
BUN: 13 mg/dL (ref 6–23)
CO2: 27 mEq/L (ref 19–32)
Calcium: 10.3 mg/dL (ref 8.4–10.5)
Chloride: 104 mEq/L (ref 96–112)
Creatinine, Ser: 1 mg/dL (ref 0.40–1.20)
GFR: 55.03 mL/min — ABNORMAL LOW (ref >60.00)
Glucose, Bld: 109 mg/dL — ABNORMAL HIGH (ref 70–99)
Potassium: 4.6 mEq/L (ref 3.5–5.1)
Sodium: 139 mEq/L (ref 135–145)

## 2019-06-06 ENCOUNTER — Ambulatory Visit: Payer: Medicare Other

## 2019-06-08 NOTE — Telephone Encounter (Signed)
You are right she needs to be scheduled with Cirigliano since she has seen him before.

## 2019-06-08 NOTE — Telephone Encounter (Signed)
Dr. Bryan Lemma is the provider for this patient and recommends the patient have a f/u appt scheduled to discuss next step in plan of care;

## 2019-06-08 NOTE — Telephone Encounter (Signed)
Please review this patient's chart to see if she is s Dr. Lyndel Safe or Dr. Bryan Lemma patient-I think she needs to be scheduled with Dr. Bryan Lemma; Please/thank you

## 2019-06-27 NOTE — Telephone Encounter (Signed)
Letter has been sent to this patient due to the office not being able to get in touch with this patient for the follow up appointment.

## 2019-07-16 NOTE — Progress Notes (Addendum)
Subjective:   Abigail Michael is a 68 y.o. female who presents for an Initial Medicare Annual Wellness Visit.  Review of Systems    No ROS.  Medicare Wellness Virtual Visit.  See social history for additional risk factors.  Cardiac Risk Factors include: dyslipidemia;advanced age (>58men, >53 women);family history of premature cardiovascular disease;obesity (BMI >30kg/m2);hypertension   Sleep patterns: Sleeps 7 hours, up to void x 1.  Home Safety/Smoke Alarms: Feels safe in home. Smoke alarms in place.  Living environment; residence and Firearm Safety: Lives with daughter and SIL (and 2 children) in 2 story home.  Seat Belt Safety/Bike Helmet: Wears seat belt.   Female:   Pap-N/A       [id number]. Scheduled 07/19/2019       Dexa scan-2017.  Scheduled 07/19/2019  [facility]-Colonoscopy 2015 in [place]. GI appt in Oct 2020.       Objective:    Today's Vitals   07/17/19 1021  BP: 118/70  Pulse: 60  SpO2: 97%  Height: 5\' 2"  (1.575 m)   Body mass index is 32.01 kg/m.  Advanced Directives 07/17/2019 09/05/2018 08/22/2018  Does Patient Have a Medical Advance Directive? Yes No No  Type of Paramedic of [place];Living will - -  Copy of [place] in Chart? No - copy requested - -  Would patient like information on creating a medical advance directive? - No - Patient declined Yes (MAU/Ambulatory/Procedural Areas - Information given)    Current Medications (verified) Outpatient Encounter Medications as of 07/17/2019  Medication Sig  . amLODipine (NORVASC) 10 MG tablet Take 1 tablet (10 mg total) by mouth daily.  Marland Kitchen aspirin EC 81 MG tablet Take 81 mg by mouth daily.  Marland Kitchen atorvastatin (LIPITOR) 40 MG tablet Take 1 tablet (40 mg total) by mouth daily.  Marland Kitchen dalfampridine 10 MG TB12 Take 10 mg by mouth 2 (two) times daily.  Marland Kitchen donepezil (ARICEPT) 5 MG tablet Take 5 mg by mouth at bedtime.  Marland Kitchen EPINEPHrine 0.3 mg/0.3 mL IJ SOAJ injection Inject 0.3 mLs  (0.3 mg total) into the skin as needed.  Marland Kitchen esomeprazole (NEXIUM) 40 MG capsule Take 1 capsule (40 mg total) by mouth daily at 12 noon.  . famotidine (PEPCID) 20 MG tablet Take 1 tablet (20 mg total) by mouth 2 (two) times daily.  Marland Kitchen FLUoxetine (PROZAC) 40 MG capsule Take 1 capsule (40 mg total) by mouth daily.  Marland Kitchen levothyroxine (SYNTHROID, LEVOTHROID) 25 MCG tablet TAKE 0.5 TABLETS (12.5 MCG TOTAL) BY MOUTH DAILY BEFORE BREAKFAST.  . MYRBETRIQ 25 MG TB24 tablet Take 25 mg by mouth daily.  . TURMERIC PO Take by mouth.   No facility-administered encounter medications on file as of 07/17/2019.     Allergies (verified) Bee venom and Amoxicillin   History: Past Medical History:  Diagnosis Date  . Acid reflux   . 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 Chicken pox   . Constipation   . Depression   . Frequent headaches   . GERD (gastroesophageal reflux disease)   . 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 Hyperlipidemia   . Hypertension   . MS (multiple sclerosis) ([facility]) 12/15/2016   possible dx. many MRI w/ positive white matter abnormality. MRI reported stable since 2015. mild progressive  Cognitive loss  . Obstructive sleep apnea    on prior neuro notes 10/2016; no results.   . Osteoporosis    had been on reclast  .  Post concussion syndrome 10/2013   ongoing issues with concentration, memory loss, lability; EEG completed and normal.   . TBI (traumatic brain injury) ([facility]) 2014   fall at [facility], hit head off of floor. No bleed, "concussion". Seen neurology since for focus and word finding.   Marland Kitchen TIA (transient ischemic attack) 11/30/2016   MRI normal. No lateralization. Speech affected and weakness during a hypotensive episdoe.   . Urinary incontinence    Past Surgical History:  Procedure Laterality Date  . BACK SURGERY  02/2015   herniated disc L4/5/6  . BREAST BIOPSY  1988   biopsy  . LAPAROSCOPIC HYSTERECTOMY     partial   . SHOULDER  ARTHROSCOPY W/ LABRAL REPAIR Left 02/13/2016   left shoulder  . Study: cognitive eval  06/23/2016   mild cog-linguistic impairment, difficulty with verbal fluency  . Study: EEG   04/12/2015   Studies: EEG completed and normal.   . Study: MRI  04/28/2016   Impression: numerous non-enhancing flair hyperintense lesions w/in the brain .Suspicious for demyelinating disease such as lyme or MS. Her Lyme test were negative.    Family History  Problem Relation Age of Onset  . Lung cancer Mother   . Alcohol abuse Father   . Throat cancer Father   . Lung cancer Father   . Depression Father   . Heart attack Father   . Breast cancer Sister   . Alcohol abuse Brother   . Diabetes Brother   . Mental illness Sister   . Stroke Daughter   . Endometriosis Daughter   . Heart disease Son   . Breast cancer Maternal Aunt   . Diabetes Maternal Grandmother   . GER disease Daughter    Social History   Socioeconomic History  . Marital status: Single    Spouse name: Not on file  . Number of children: Not on file  . Years of education: Not on file  . Highest education level: Not on file  Occupational History  . Occupation: retired  Scientific laboratory technician  . Financial resource strain: Not on file  . Food insecurity    Worry: Not on file    Inability: Not on file  . Transportation needs    Medical: Not on file    Non-medical: Not on file  Tobacco Use  . Smoking status: Former Smoker    Packs/day: 1.00    Years: 19.00    Pack years: 19.00    Quit date: 2012    Years since quitting: 8.7  . Smokeless tobacco: Never Used  . Tobacco comment: quit 6 years ago  Substance and Sexual Activity  . Alcohol use: Yes    Alcohol/week: 1.0 standard drinks    Types: 1 Glasses of wine per week    Comment: twice a month  . Drug use: Never  . Sexual activity: Not Currently    Partners: Male  Lifestyle  . Physical activity    Days per week: Not on file    Minutes per session: Not on file  . Stress: Not on file   Relationships  . Social Herbalist on phone: Not on file    Gets together: Not on file    Attends religious service: Not on file    Active member of club or organization: Not on file    Attends meetings of clubs or organizations: Not on file    Relationship status: Not on file  Other Topics Concern  . Not on file  Social History Narrative   Single. Lived in [place] area, moved to [place] to be close to her daughters 2018. Lives with her daughter.   Some college, worked as Programmer, systems. Retired.    Social drinker. Former smoker.   Exercises routinely.    Wears dentures.    Drinks caffeine, takes a daily vitamin.   Smoke alarm in the home. Wears her seatbelt.    Feels safe in her relationships.     Tobacco Counseling Counseling given: Not Answered Comment: quit 6 years ago    Activities of Daily Living In your present state of health, do you have any difficulty performing the following activities: 07/17/2019  Hearing? Y  Vision? N  Difficulty concentrating or making decisions? Y  Comment head injury 2015  Walking or climbing stairs? N  Dressing or bathing? N  Doing errands, shopping? N  Preparing Food and eating ? N  Using the Toilet? N  In the past six months, have you accidently leaked urine? Y  Do you have problems with loss of bowel control? N  Managing your Medications? N  Managing your Finances? N  Housekeeping or managing your Housekeeping? N  Some recent data might be hidden     Immunizations and Health Maintenance Immunization History  Administered Date(s) Administered  . Influenza, High Dose Seasonal PF 08/28/2018, 06/13/2019  . Pneumococcal Polysaccharide-23 08/28/2018  . Zoster Recombinat (Shingrix) 05/10/2019   Health Maintenance Due  Topic Date Due  . MAMMOGRAM  09/21/2018    Patient Care Team: Ma Hillock, DO as PCP - General (Family Medicine) Sharlene Dory, MD as Referring Physician (Neurology) Tommi Rumps (Dentistry) Wallace Going  (Orthodontics) Feliberto Harts R (Inactive) (Oral Surgery) Basuroski, Jacqulynn Cadet, MD as Referring Physician (Psychiatry)  Indicate any recent Medical Services you may have received from other than [facility] providers in the past year (date may be approximate).     Assessment:   This is a routine wellness examination for [place].  Hearing/Vision screen  Hearing Screening   Method: Audiometry   125Hz  250Hz  500Hz  1000Hz  2000Hz  3000Hz  4000Hz  6000Hz  8000Hz   Right ear:   Pass Pass Pass  Pass    Left ear:   Pass Pass Pass  Pass    Vision Screening Comments: Last exam > 2 years. Wears glasses.   Dietary issues and exercise activities discussed: Current Exercise Habits: Home exercise routine, Type of exercise: walking, Time (Minutes): 30, Frequency (Times/Week): 3, Weekly Exercise (Minutes/Week): 90, Exercise limited by: None identified   Diet (meal preparation, eat out, water intake, caffeinated beverages, dairy products, fruits and vegetables): Drinks water, shake and coffee.   Breakfast: egg/oatmeal, coffee Dinner: soup; potatoes; salads; chicken    Goals    . Patient Stated     Work on focused thinking, gets distracted easily.       Depression Screen PHQ 2/9 Scores 07/17/2019 05/07/2019 08/28/2018 02/17/2018 01/16/2018  PHQ - 2 Score 0 0 0 0 4  PHQ- 9 Score - - 2 0 13    Fall Risk Fall Risk  07/17/2019 05/07/2019 08/28/2018 01/16/2018  Falls in the past year? 0 0 0 Yes  Comment - - - Jan 2016  Number falls in past yr: 0 - 0 1  Injury with Fall? 0 - 0 Yes  Risk for fall due to : - - Impaired balance/gait;Mental status change History of fall(s)  Follow up Falls prevention discussed Falls evaluation completed Falls evaluation completed;Falls prevention discussed Follow up appointment     Screening Tests Health Maintenance  Topic Date Due  . MAMMOGRAM  09/21/2018  . COLONOSCOPY  09/18/2019 (Originally 10/13/2000)  . TETANUS/TDAP  07/16/2020 (Originally [birth year]1969)  . PNA vac Low Risk  Adult (2 of 2 - PCV13) 08/29/2019  . INFLUENZA VACCINE  Completed  . DEXA SCAN  Completed  . Hepatitis C Screening  Completed       Plan:     [email or web address] memory games online  Go to pharmacy for second Shingles vaccine.   Bring a copy of your living will and/or healthcare power of attorney to your next office visit.  Continue doing brain stimulating activities (puzzles, reading, adult coloring books, staying active) to keep memory sharp.   I have personally reviewed and noted the following in the patient's chart:   . Medical and social history . Use of alcohol, tobacco or illicit drugs  . Current medications and supplements . Functional ability and status . Nutritional status . Physical activity . Advanced directives . List of other physicians . Hospitalizations, surgeries, and ER visits in previous 12 months . Vitals . Screenings to include cognitive, depression, and falls . Referrals and appointments  In addition, I have reviewed and discussed with patient certain preventive protocols, quality metrics, and best practice recommendations. A written personalized care plan for preventive services as well as general preventive health recommendations were provided to patient.     Gerilyn Nestle, RN   07/17/2019    PCP Notes: -Pt interested in bladder injections for leakage (would like to discuss at next appt) -Pt reports sharp pain shooting down left foot 1/month x 5 min.(advised to call if increasing pain/concern for appt) -Pt requesting to restart PT for gait stability (phone note sent) -Next OV with PCP 10/2019.   Electronically Signed by: Howard Pouch, DO [facility] [place]

## 2019-07-17 ENCOUNTER — Other Ambulatory Visit: Payer: Self-pay

## 2019-07-17 ENCOUNTER — Ambulatory Visit (INDEPENDENT_AMBULATORY_CARE_PROVIDER_SITE_OTHER): Payer: Medicare Other

## 2019-07-17 ENCOUNTER — Telehealth: Payer: Self-pay

## 2019-07-17 VITALS — BP 118/70 | HR 60 | Ht 62.0 in | Wt 170.0 lb

## 2019-07-17 DIAGNOSIS — Z0000 Encounter for general adult medical examination without abnormal findings: Secondary | ICD-10-CM | POA: Diagnosis not present

## 2019-07-17 DIAGNOSIS — R9082 White matter disease, unspecified: Secondary | ICD-10-CM

## 2019-07-17 DIAGNOSIS — R2681 Unsteadiness on feet: Secondary | ICD-10-CM

## 2019-07-17 NOTE — Patient Instructions (Addendum)
[email or web address]-for memory games online  Go to pharmacy for second Shingles vaccine.   Bring a copy of your living will and/or healthcare power of attorney to your next office visit.  Continue doing brain stimulating activities (puzzles, reading, adult coloring books, staying active) to keep memory sharp.    Fall Prevention in the Home, Adult Falls can cause injuries. They can happen to people of all ages. There are many things you can do to make your home safe and to help prevent falls. Ask for help when making these changes, if needed. What actions can I take to prevent falls? General Instructions  Use good lighting in all rooms. Replace any light bulbs that burn out.  Turn on the lights when you go into a dark area. Use night-lights.  Keep items that you use often in easy-to-reach places. Lower the shelves around your home if necessary.  Set up your furniture so you have a clear path. Avoid moving your furniture around.  Do not have throw rugs and other things on the floor that can make you trip.  Avoid walking on wet floors.  If any of your floors are uneven, fix them.  Add color or contrast paint or tape to clearly mark and help you see: ? Any grab bars or handrails. ? First and last steps of stairways. ? Where the edge of each step is.  If you use a stepladder: ? Make sure that it is fully opened. Do not climb a closed stepladder. ? Make sure that both sides of the stepladder are locked into place. ? Ask someone to hold the stepladder for you while you use it.  If there are any pets around you, be aware of where they are. What can I do in the bathroom?      Keep the floor dry. Clean up any water that spills onto the floor as soon as it happens.  Remove soap buildup in the tub or shower regularly.  Use non-skid mats or decals on the floor of the tub or shower.  Attach bath mats securely with double-sided, non-slip rug tape.  If you need to sit down in the shower,  use a plastic, non-slip stool.  Install grab bars by the toilet and in the tub and shower. Do not use towel bars as grab bars. What can I do in the bedroom?  Make sure that you have a light by your bed that is easy to reach.  Do not use any sheets or blankets that are too big for your bed. They should not hang down onto the floor.  Have a firm chair that has side arms. You can use this for support while you get dressed. What can I do in the kitchen?  Clean up any spills right away.  If you need to reach something above you, use a strong step stool that has a grab bar.  Keep electrical cords out of the way.  Do not use floor polish or wax that makes floors slippery. If you must use wax, use non-skid floor wax. What can I do with my stairs?  Do not leave any items on the stairs.  Make sure that you have a light switch at the top of the stairs and the bottom of the stairs. If you do not have them, ask someone to add them for you.  Make sure that there are handrails on both sides of the stairs, and use them. Fix handrails that are broken or loose. Make sure that  handrails are as long as the stairways.  Install non-slip stair treads on all stairs in your home.  Avoid having throw rugs at the top or bottom of the stairs. If you do have throw rugs, attach them to the floor with carpet tape.  Choose a carpet that does not hide the edge of the steps on the stairway.  Check any carpeting to make sure that it is firmly attached to the stairs. Fix any carpet that is loose or worn. What can I do on the outside of my home?  Use bright outdoor lighting.  Regularly fix the edges of walkways and driveways and fix any cracks.  Remove anything that might make you trip as you walk through a door, such as a raised step or threshold.  Trim any bushes or trees on the path to your home.  Regularly check to see if handrails are loose or broken. Make sure that both sides of any steps have  handrails.  Install guardrails along the edges of any raised decks and porches.  Clear walking paths of anything that might make someone trip, such as tools or rocks.  Have any leaves, snow, or ice cleared regularly.  Use sand or salt on walking paths during winter.  Clean up any spills in your garage right away. This includes grease or oil spills. What other actions can I take?  Wear shoes that: ? Have a low heel. Do not wear high heels. ? Have rubber bottoms. ? Are comfortable and fit you well. ? Are closed at the toe. Do not wear open-toe sandals.  Use tools that help you move around (mobility aids) if they are needed. These include: ? Canes. ? Walkers. ? Scooters. ? Crutches.  Review your medicines with your doctor. Some medicines can make you feel dizzy. This can increase your chance of falling. Ask your doctor what other things you can do to help prevent falls. Where to find more information  Centers for Disease Control and Prevention, STEADI: https://garcia.biz/  [employer] on Aging: BrainJudge.co.uk Contact a doctor if:  You are afraid of falling at home.  You feel weak, drowsy, or dizzy at home.  You fall at home. Summary  There are many simple things that you can do to make your home safe and to help prevent falls.  Ways to make your home safe include removing tripping hazards and installing grab bars in the bathroom.  Ask for help when making these changes in your home. This information is not intended to replace advice given to you by your health care provider. Make sure you discuss any questions you have with your health care provider. Document Released: 07/31/2009 Document Revised: 01/25/2019 Document Reviewed: 05/19/2017 Elsevier Patient Education  2020 [place] Maintenance, Female Adopting a healthy lifestyle and getting preventive care are important in promoting health and wellness. Ask your health care provider about:   The right schedule for you to have regular tests and exams.  Things you can do on your own to prevent diseases and keep yourself healthy. What should I know about diet, weight, and exercise? Eat a healthy diet   Eat a diet that includes plenty of vegetables, fruits, low-fat dairy products, and lean protein.  Do not eat a lot of foods that are high in solid fats, added sugars, or sodium. Maintain a healthy weight Body mass index (BMI) is used to identify weight problems. It estimates body fat based on height and weight. Your health care provider  can help determine your BMI and help you achieve or maintain a healthy weight. Get regular exercise Get regular exercise. This is one of the most important things you can do for your health. Most adults should:  Exercise for at least 150 minutes each week. The exercise should increase your heart rate and make you sweat (moderate-intensity exercise).  Do strengthening exercises at least twice a week. This is in addition to the moderate-intensity exercise.  Spend less time sitting. Even light physical activity can be beneficial. Watch cholesterol and blood lipids Have your blood tested for lipids and cholesterol at 69 years of age, then have this test every 5 years. Have your cholesterol levels checked more often if:  Your lipid or cholesterol levels are high.  You are older than 69 years of age.  You are at high risk for heart disease. What should I know about cancer screening? Depending on your health history and family history, you may need to have cancer screening at various ages. This may include screening for:  Breast cancer.  Cervical cancer.  Colorectal cancer.  Skin cancer.  Lung cancer. What should I know about heart disease, diabetes, and high blood pressure? Blood pressure and heart disease  High blood pressure causes heart disease and increases the risk of stroke. This is more likely to develop in people who have high  blood pressure readings, are of African descent, or are overweight.  Have your blood pressure checked: ? Every 3-5 years if you are 7-79 years of age. ? Every year if you are 29 years old or older. Diabetes Have regular diabetes screenings. This checks your fasting blood sugar level. Have the screening done:  Once every three years after age 13 if you are at a normal weight and have a low risk for diabetes.  More often and at a younger age if you are overweight or have a high risk for diabetes. What should I know about preventing infection? Hepatitis B If you have a higher risk for hepatitis B, you should be screened for this virus. Talk with your health care provider to find out if you are at risk for hepatitis B infection. Hepatitis C Testing is recommended for:  Everyone born from [birth year] through 1966.  Anyone with known risk factors for hepatitis C. Sexually transmitted infections (STIs)  Get screened for STIs, including gonorrhea and chlamydia, if: ? You are sexually active and are younger than 69 years of age. ? You are older than 69 years of age and your health care provider tells you that you are at risk for this type of infection. ? Your sexual activity has changed since you were last screened, and you are at increased risk for chlamydia or gonorrhea. Ask your health care provider if you are at risk.  Ask your health care provider about whether you are at high risk for HIV. Your health care provider may recommend a prescription medicine to help prevent HIV infection. If you choose to take medicine to prevent HIV, you should first get tested for HIV. You should then be tested every 3 months for as long as you are taking the medicine. Pregnancy  If you are about to stop having your period (premenopausal) and you may become pregnant, seek counseling before you get pregnant.  Take 400 to 800 micrograms (mcg) of folic acid every day if you become pregnant.  Ask for birth control  (contraception) if you want to prevent pregnancy. Osteoporosis and menopause Osteoporosis is a disease in  which the bones lose minerals and strength with aging. This can result in bone fractures. If you are 60 years old or older, or if you are at risk for osteoporosis and fractures, ask your health care provider if you should:  Be screened for bone loss.  Take a calcium or vitamin D supplement to lower your risk of fractures.  Be given hormone replacement therapy (HRT) to treat symptoms of menopause. Follow these instructions at home: Lifestyle  Do not use any products that contain nicotine or tobacco, such as cigarettes, e-cigarettes, and chewing tobacco. If you need help quitting, ask your health care provider.  Do not use street drugs.  Do not share needles.  Ask your health care provider for help if you need support or information about quitting drugs. Alcohol use  Do not drink alcohol if: ? Your health care provider tells you not to drink. ? You are pregnant, may be pregnant, or are planning to become pregnant.  If you drink alcohol: ? Limit how much you use to 0-1 drink a day. ? Limit intake if you are breastfeeding.  Be aware of how much alcohol is in your drink. In the [place] one drink equals one 12 oz bottle of beer (355 mL), one 5 oz glass of wine (148 mL), or one 1 oz glass of hard liquor (44 mL). General instructions  Schedule regular health, dental, and eye exams.  Stay current with your vaccines.  Tell your health care provider if: ? You often feel depressed. ? You have ever been abused or do not feel safe at home. Summary  Adopting a healthy lifestyle and getting preventive care are important in promoting health and wellness.  Follow your health care provider's instructions about healthy diet, exercising, and getting tested or screened for diseases.  Follow your health care provider's instructions on monitoring your cholesterol and blood pressure. This  information is not intended to replace advice given to you by your health care provider. Make sure you discuss any questions you have with your health care provider. Document Released: 04/19/2011 Document Revised: 09/27/2018 Document Reviewed: 09/27/2018 Elsevier Patient Education  2020 [employer].

## 2019-07-17 NOTE — Telephone Encounter (Signed)
Patient in for AWV.  Patient requesting to restart PT for gain instability. She reports weak ankles and fear of falling. Therapy sessions ended d/t COVID 19. Per [facility], they will need an updated referral with possible re-evaluation.  Please advise.

## 2019-07-19 ENCOUNTER — Ambulatory Visit
Admission: RE | Admit: 2019-07-19 | Discharge: 2019-07-19 | Disposition: A | Discharge status: Home / Self Care | Payer: Medicare Other | Source: Ambulatory Visit | Attending: Family Medicine | Admitting: Family Medicine

## 2019-07-19 ENCOUNTER — Other Ambulatory Visit: Payer: Self-pay

## 2019-07-19 DIAGNOSIS — Z780 Asymptomatic menopausal state: Secondary | ICD-10-CM | POA: Diagnosis not present

## 2019-07-19 DIAGNOSIS — M8589 Other specified disorders of bone density and structure, multiple sites: Secondary | ICD-10-CM | POA: Diagnosis not present

## 2019-07-19 DIAGNOSIS — Z1231 Encounter for screening mammogram for malignant neoplasm of breast: Secondary | ICD-10-CM | POA: Diagnosis not present

## 2019-07-19 DIAGNOSIS — M810 Age-related osteoporosis without current pathological fracture: Secondary | ICD-10-CM

## 2019-07-19 NOTE — Addendum Note (Signed)
Addended by: Howard Pouch A on: 07/19/2019 07:52 AM   Modules accepted: Orders

## 2019-07-19 NOTE — Telephone Encounter (Signed)
Detailed message left informing patient that referral has been ordered.

## 2019-07-19 NOTE — Telephone Encounter (Signed)
Please inform patient the following information: Referral placed to restart PT

## 2019-07-21 NOTE — Progress Notes (Signed)
AWV reviewed and agree. Signed:  Phil Micco Bourbeau, MD           07/21/2019  

## 2019-07-24 ENCOUNTER — Other Ambulatory Visit: Payer: Self-pay

## 2019-07-24 ENCOUNTER — Encounter: Payer: Self-pay | Admitting: Physical Therapy

## 2019-07-24 ENCOUNTER — Ambulatory Visit: Payer: Medicare Other | Attending: Family Medicine | Admitting: Physical Therapy

## 2019-07-24 DIAGNOSIS — R419 Unspecified symptoms and signs involving cognitive functions and awareness: Secondary | ICD-10-CM | POA: Diagnosis not present

## 2019-07-24 DIAGNOSIS — M6281 Muscle weakness (generalized): Secondary | ICD-10-CM | POA: Diagnosis not present

## 2019-07-24 DIAGNOSIS — N3281 Overactive bladder: Secondary | ICD-10-CM | POA: Diagnosis not present

## 2019-07-24 DIAGNOSIS — R9082 White matter disease, unspecified: Secondary | ICD-10-CM | POA: Diagnosis not present

## 2019-07-24 DIAGNOSIS — R2681 Unsteadiness on feet: Secondary | ICD-10-CM | POA: Diagnosis not present

## 2019-07-24 DIAGNOSIS — R2689 Other abnormalities of gait and mobility: Secondary | ICD-10-CM | POA: Diagnosis not present

## 2019-07-24 DIAGNOSIS — R5382 Chronic fatigue, unspecified: Secondary | ICD-10-CM | POA: Diagnosis not present

## 2019-07-24 NOTE — Therapy (Signed)
[facility] [address] [place], [place], [address] Phone: [phone]   Fax:  [phone]  Physical Therapy Evaluation  Patient Details  Name: Abigail Michael MRN: [id number] Date of Birth: October 31, 1950 Referring Provider (PT): Dr. Raoul Pitch   Encounter Date: 07/24/2019  PT End of Session - 07/24/19 2057    Visit Number  1    Number of Visits  17    Date for PT Re-Evaluation  10/22/19   written for 90 day POC   Authorization Type  Medicare + BCBS    PT Start Time  1017    PT Stop Time  1100    PT Time Calculation (min)  43 min    Equipment Utilized During Treatment  Gait belt    Activity Tolerance  Patient tolerated treatment well    Behavior During Therapy  WFL for tasks assessed/performed       Past Medical History:  Diagnosis Date  . Acid reflux   . 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 Chicken pox   . Constipation   . Depression   . Frequent headaches   . GERD (gastroesophageal reflux disease)   . 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 Hyperlipidemia   . Hypertension   . MS (multiple sclerosis) ([place]) 12/15/2016   possible dx. many MRI w/ positive white matter abnormality. MRI reported stable since 2015. mild progressive  Cognitive loss  . Obstructive sleep apnea    on prior neuro notes 10/2016; no results.   . Osteoporosis    had been on reclast  . Post concussion syndrome 10/2013   ongoing issues with concentration, memory loss, lability; EEG completed and normal.   . TBI (traumatic brain injury) ([place]) 2014   fall at [facility], hit head off of floor. No bleed, "concussion". Seen neurology since for focus and word finding.   Marland Kitchen TIA (transient ischemic attack) 11/30/2016   MRI normal. No lateralization. Speech affected and weakness during a hypotensive episdoe.   . Urinary incontinence     Past Surgical History:  Procedure Laterality Date  . BACK SURGERY  02/2015    herniated disc L4/5/6  . BREAST BIOPSY  1988   biopsy  . LAPAROSCOPIC HYSTERECTOMY     partial   . SHOULDER ARTHROSCOPY W/ LABRAL REPAIR Left 02/13/2016   left shoulder  . Study: cognitive eval  06/23/2016   mild cog-linguistic impairment, difficulty with verbal fluency  . Study: EEG   04/12/2015   Studies: EEG completed and normal.   . Study: MRI  04/28/2016   Impression: numerous non-enhancing flair hyperintense lesions w/in the brain .Suspicious for demyelinating disease such as lyme or MS. Her Lyme test were negative.     There were no vitals filed for this visit.   Subjective Assessment - 07/24/19 1021    Subjective  Loved aquatic therapy - would really like to get back to it. Is finding that her ankles are still weak, is doing exercises with the bands. Walks about 1-2x week for 40 minutes and the foot-up brace helps tremendously. Back is still giving her discomfort - in the center of her low back. Still doing her HEP from last time. No falls. At end of June reports feeling shocks in her L foot and she felt like she was going to fall - has not had any since just some tingling.    Pertinent History  depression, headaches, HTN, MS (possible dx),  OSA, post-consussion syndrome, TBI, TIA, osteopenia    Patient Stated Goals  wants to have better balance, feels more sure with herself - doesn't want to be scared of falling    Currently in Pain?  Yes    Pain Score  3     Pain Location  Ankle    Pain Orientation  Left    Pain Descriptors / Indicators  Sore    Pain Type  Chronic pain    Aggravating Factors   walking    Pain Relieving Factors  elevating and putting her foot on a pillow or by massaging it         [facility] PT Assessment - 07/24/19 1030      Assessment   Medical Diagnosis  TBI; gait instability    Referring Provider (PT)  Dr. Raoul Pitch    Onset Date/Surgical Date  --   2015-concussion   Prior Therapy  yes   previous PT/ST and aquatic at this location prior to covid      Precautions   Precautions  Fall    Precaution Comments  per pt - "instructions need to be repeated multiple times in order for me to fully understand"      Restrictions   Weight Bearing Restrictions  No      Balance Screen   Has the patient fallen in the past 6 months  No    Has the patient had a decrease in activity level because of a fear of falling?   No    Is the patient reluctant to leave their home because of a fear of falling?   No   very aware of sidewalks and steps     [place] residence    Living Arrangements  Children    Type of [place] to enter    Entrance Stairs-Number of Steps  3    Entrance Stairs-Rails  Can reach both    [place]  Two level    Alternate Level Stairs-Number of Steps  --   flight of steps   Alternate Level Stairs-Rails  Right   and wall on the left   Home Equipment  None      Prior Function   Level of Independence  Independent    Leisure  walking, likes to garden, likes to paint (furniture and inside houses on walls)      Observation/Other Assessments   Observations  Pt emotional at one point during evaluation - pt stating that she feels frustrated with her cognition and became tearful, therapist providing emotional support.      Sensation   Light Touch  Appears Intact      Coordination   Gross Motor Movements are Fluid and Coordinated  Yes      ROM / Strength   AROM / PROM / Strength  Strength      Strength   Right Hip Flexion  4/5    Left Hip Flexion  4/5    Right Knee Flexion  4+/5    Right Knee Extension  5/5    Left Knee Flexion  4+/5    Left Knee Extension  4/5    Right Ankle Dorsiflexion  5/5    Left Ankle Dorsiflexion  4+/5    Left Ankle Inversion  4/5    Left Ankle Eversion  4+/5      Ambulation/Gait   Ambulation/Gait  Yes    Ambulation/Gait Assistance  5: Supervision;4: Min guard    Ambulation Distance (Feet)  300 Feet   approx. clinic distances    Assistive device  None    Gait Pattern  Step-through pattern;Wide base of support;Lateral hip instability;Decreased dorsiflexion - left;Decreased weight shift to left    Ambulation Surface  Level;Indoor    Gait velocity  3.14 ft/sec      Standardized Balance Assessment   Five times sit to stand comments   13.7 seconds    no UE support from low blue mat table     Functional Gait  Assessment   Gait assessed   Yes    Gait Level Surface  Walks 20 ft in less than 7 sec but greater than 5.5 sec, uses assistive device, slower speed, mild gait deviations, or deviates 6-10 in outside of the 12 in walkway width.   6.19 seconds    Change in Gait Speed  Able to change speed, demonstrates mild gait deviations, deviates 6-10 in outside of the 12 in walkway width, or no gait deviations, unable to achieve a major change in velocity, or uses a change in velocity, or uses an assistive device.    Gait with Horizontal Head Turns  Performs head turns smoothly with slight change in gait velocity (eg, minor disruption to smooth gait path), deviates 6-10 in outside 12 in walkway width, or uses an assistive device.    Gait with Vertical Head Turns  Performs task with moderate change in gait velocity, slows down, deviates 10-15 in outside 12 in walkway width but recovers, can continue to walk.    Gait and Pivot Turn  Pivot turns safely within 3 sec and stops quickly with no loss of balance.    Step Over Obstacle  Is able to step over one shoe box (4.5 in total height) without changing gait speed. No evidence of imbalance.    Gait with Narrow Base of Support  Ambulates less than 4 steps heel to toe or cannot perform without assistance.    Gait with Eyes Closed  Walks 20 ft, slow speed, abnormal gait pattern, evidence for imbalance, deviates 10-15 in outside 12 in walkway width. Requires more than 9 sec to ambulate 20 ft.   14.5 seconds   Ambulating Backwards  Walks 20 ft, slow speed, abnormal gait pattern, evidence for  imbalance, deviates 10-15 in outside 12 in walkway width.   23.3 seconds    Steps  Alternating feet, must use rail.    Total Score  16    FGA comment:  16/30                Objective measurements completed on examination: See above findings.                PT Short Term Goals - 07/25/19 1300      PT SHORT TERM GOAL #1   Title  Pt will be IND with HEP in order to build upon functional gains made in therapy. ALL STGS DUE 08/24/19    Time  4    Period  Weeks    Status  New    Target Date  08/24/19      PT SHORT TERM GOAL #2   Title  Patient will improve FGA score to at least 19/30 in order to demonstrate decreased fall risk.    Baseline  16/30 on 07/25/19    Time  4    Period  Weeks    Status  New  PT SHORT TERM GOAL #3   Title  Pt will perform floor<>stand txfs with supervision and with UE support to improve safety.    Baseline  not yet assessed.    Time  4    Period  Weeks    Status  New      PT SHORT TERM GOAL #4   Title  Patient will begin aquatic therapy PT in order to improve strength, balance, and gait.    Time  4    Period  Weeks    Status  New      PT SHORT TERM GOAL #5   Title  Patient will ambulate at least 230' over indoor/level surfaces while scanning environment with supervision with no LOB.    Time  4    Period  Weeks    Status  New        PT Long Term Goals - 07/25/19 1303      PT LONG TERM GOAL #1   Title  Pt will be independent with final HEP (including aquatics) in order to indicate improved balance and strength. ALL LTGS DUE 09/23/19    Time  8    Period  Weeks    Status  New    Target Date  09/23/19      PT LONG TERM GOAL #2   Title  Patient will perform diaphragmatic breathing techniques in supine/sitting with no verbal cues in order to calm down nervous system and report decreased feelings of being overwhelmed.    Time  8    Period  Weeks    Status  New      PT LONG TERM GOAL #3   Title  Patient will improve  FGA to at least 23/30 in order to demo decreased fall risk.    Baseline  16/30 on 07/25/19    Time  8    Period  Weeks    Status  New      PT LONG TERM GOAL #4   Title  Patient will ambulate at least 500' outdoors on paved surfaces while scanning environment with no LOB and suprevision in order to demonstrate improved community mobility.    Time  8    Period  Weeks    Status  New      PT LONG TERM GOAL #5   Title  Patient will perform 12 steps with single handrail on R and step through vs. step to pattern with supervision in order to demonstrate improved safety while going up and down her flight of stairs at home.    Time  8    Period  Weeks    Status  New            07/24/19 2059  Plan  Clinical Impression Statement Patient is a 68 year old female referred to Neuro OPPT for evaluation with primary concern of gait instability. Pt is known to this clinic - participated in PT and aquatic PT prior to Covid. Since Covid, she has noticed a decline in her balance.  Pt's PMH is significant for: depression, headaches, HTN, MS (possible dx), OSA, post-concussion syndrome, TBI, TIA, osteopenia.  The following deficits were present during the exam: impaired dynamic balance, gait abnormalities, decreased L>R LE strength . Noted increased postural sway with gait with vertical head nods during gait. Pt's FGA scores indicate pt is at a high risk for falls.  Pt would benefit from skilled PT to address these impairments and functional limitations to maximize functional mobility  independence  Personal Factors and Comorbidities Time since onset of injury/illness/exacerbation;Past/Current Experience;Comorbidity 3+  Comorbidities depression, headaches, HTN, MS (possible dx), OSA, post-consussion syndrome, TBI, TIA, osteopenia  Examination-Activity Limitations Stairs;Locomotion Level  Examination-Participation Restrictions Community Activity  Pt will benefit from skilled therapeutic intervention in order to  improve on the following deficits Decreased balance;Decreased activity tolerance;Decreased mobility;Decreased strength;Difficulty walking;Abnormal gait  Stability/Clinical Decision Making Stable/Uncomplicated  Clinical Decision Making Low  Rehab Potential Good  PT Frequency 2x / week  PT Duration 8 weeks  PT Treatment/Interventions ADLs/Self Care Home Management;Gait training;Stair training;Functional mobility training;Therapeutic activities;Therapeutic exercise;Balance training;Patient/family education;Neuromuscular re-education;Manual techniques;Aquatic Therapy  PT Next Visit Plan info about pool therapy? (gave referral to [facility]) initial HEP for LE strengthening, pt previously here pre-COVID, review old exercises and upgrade/add additional if needed. (ankles) and balance. Breathing techniques for relaxation/decreasing anxiety (pt reporting she feels very overwhelmed at times and wants techniques to de-stress), standing balance with head turns, eyes closed balance. Fall prevention (getting up off the floor).  PT Home Exercise Plan [email or web address] (from previous therapy prior to Covid)  Recommended Other Services aquatic therapy  Consulted and Agree with Plan of Care Patient         Patient will benefit from skilled therapeutic intervention in order to improve the following deficits and impairments:  Decreased balance, Decreased activity tolerance, Decreased mobility, Decreased strength, Difficulty walking, Abnormal gait  Visit Diagnosis: Other abnormalities of gait and mobility  Unsteadiness on feet  Muscle weakness (generalized)     Problem List Patient Active Problem List   Diagnosis Date Noted  . CKD (chronic kidney disease) stage 3, GFR 30-59 ml/min 05/08/2019  . GERD (gastroesophageal reflux disease) 05/07/2019  . Overactive bladder 11/26/2018  . Raynaud disease 10/06/2018  . Encounter for monitoring long-term proton pump inhibitor therapy 08/28/2018  . White matter disease  02/20/2018  . Osteoporosis   . Hypercalcemia 01/17/2018  . Essential hypertension 01/16/2018  . Hypothyroidism 01/16/2018  . Depression with anxiety 01/16/2018  . Traumatic brain injury with loss of consciousness ([place]) 01/16/2018  . Hyperlipidemia     Arliss Journey, PT, DPT  07/25/2019, 1:09 PM  [facility] [address] [place], [place], [address] Phone: [phone]   Fax:  [phone]  Name: Abigail Michael MRN: [id number] Date of Birth: 20-Dec-1950

## 2019-07-25 ENCOUNTER — Other Ambulatory Visit: Payer: Self-pay

## 2019-07-25 ENCOUNTER — Encounter: Payer: Self-pay | Admitting: Family Medicine

## 2019-07-25 MED ORDER — LEVOTHYROXINE SODIUM 25 MCG PO TABS: 12.5000 ug | ORAL_TABLET | Freq: Every day | ORAL | 0 refills | Status: DC

## 2019-07-27 ENCOUNTER — Ambulatory Visit (INDEPENDENT_AMBULATORY_CARE_PROVIDER_SITE_OTHER): Payer: Medicare Other | Admitting: Gastroenterology

## 2019-07-27 ENCOUNTER — Other Ambulatory Visit: Payer: Self-pay

## 2019-07-27 ENCOUNTER — Encounter: Payer: Self-pay | Admitting: Gastroenterology

## 2019-07-27 VITALS — BP 124/76 | HR 57 | Temp 98.1°F | Ht 62.5 in | Wt 172.1 lb

## 2019-07-27 DIAGNOSIS — K449 Diaphragmatic hernia without obstruction or gangrene: Secondary | ICD-10-CM

## 2019-07-27 DIAGNOSIS — K219 Gastro-esophageal reflux disease without esophagitis: Secondary | ICD-10-CM

## 2019-07-27 DIAGNOSIS — K2270 Barrett's esophagus without dysplasia: Secondary | ICD-10-CM | POA: Diagnosis not present

## 2019-07-27 DIAGNOSIS — Z86010 Personal history of colonic polyps: Secondary | ICD-10-CM | POA: Diagnosis not present

## 2019-07-27 MED ORDER — SUPREP BOWEL PREP KIT 17.5-3.13-1.6 GM/177ML PO SOLN: 1.0000 | ORAL | 0 refills | Status: DC

## 2019-07-27 NOTE — Progress Notes (Signed)
P  Chief Complaint:    GERD, Barrett's Esophagus, polyp surveillance  GI History: Abigail Michael is a 69 y.o. female with a history of MS, hypertension, hyperlipidemia, TIA, TBI (w/ memory loss),  follows in the GI clinic for GERD.  Longstanding history of reflux symptoms (pyrosis, regurgitation, dyspepsia) but with worsening symptoms in 2020. Had been previously treated in [place] and moved to [place] 2 years ago.   Reflux well controlled with Nexium 40 mg daily and Pepcid 20 mg twice daily. Breakthrough with any missed dose or if single agent. Previously treated with Dexilant and twice daily Zantac.    No dysphagia. Reported history of Barrett's Esophagus on EGD in 2016 (no biopsy report available for review).  No subsequent EGD.  Endoscopic history: -EGD (01/20/2015, Dr. Margy Clarks, [name], [place]): 3-4 cm Hiatal hernia, incompetent LES, short segment Barrett's esophagus. Normal stomach and duodenum. Biopsies taken but not available for review.   HPI:     Patient is a 68 y.o. female presenting to the Gastroenterology Clinic for follow-up.  Had previously recommended repeat EGD for Barrett's surveillance when lifting of COVID-19 restrictions- planning on scheduling today.  Today she states reflux well controlled on Pepcid/Nexium w/o any breakthrough symptoms.  Tolerating all p.o. intake.  No complaints.  Additionally, she states she is due for repeat colonoscopy for ongoing surveillance. Last colonoscopy was in [place] 5 years ago and n/f polyps.  She is otherwise without lower GI symptoms.   Review of systems:     No chest pain, no SOB, no fevers, no urinary sx   Past Medical History:  Diagnosis Date  . Acid reflux   . Brain injury ([place]) 2015   Due to a fall   . 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 Chicken pox   . Constipation   . Depression   . Frequent headaches   . GERD (gastroesophageal reflux disease)   . 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  Hyperlipidemia   . Hypertension   . MS (multiple sclerosis) ([place]) 12/15/2016   possible dx. many MRI w/ positive white matter abnormality. MRI reported stable since 2015. mild progressive  Cognitive loss  . Obstructive sleep apnea    on prior neuro notes 10/2016; no results.   . Osteoporosis    had been on reclast  . Post concussion syndrome 10/2013   ongoing issues with concentration, memory loss, lability; EEG completed and normal.   . TBI (traumatic brain injury) ([place]) 2014   fall at [facility], hit head off of floor. No bleed, "concussion". Seen neurology since for focus and word finding.   Marland Kitchen TIA (transient ischemic attack) 11/30/2016   MRI normal. No lateralization. Speech affected and weakness during a hypotensive episdoe.   . Urinary incontinence     Patient's surgical history, family medical history, social history, medications and allergies were all reviewed in Epic    Current Outpatient Medications  Medication Sig Dispense Refill  . amLODipine (NORVASC) 10 MG tablet Take 1 tablet (10 mg total) by mouth daily. 90 tablet 1  . aspirin EC 81 MG tablet Take 81 mg by mouth daily.    Marland Kitchen atorvastatin (LIPITOR) 40 MG tablet Take 1 tablet (40 mg total) by mouth daily. 90 tablet 3  . dalfampridine 10 MG TB12 Take 10 mg by mouth 2 (two) times daily.    Marland Kitchen donepezil (ARICEPT) 5 MG tablet Take 5 mg by mouth at bedtime.    Marland Kitchen  EPINEPHrine 0.3 mg/0.3 mL IJ SOAJ injection Inject 0.3 mLs (0.3 mg total) into the skin as needed. 1 each 1  . esomeprazole (NEXIUM) 40 MG capsule Take 1 capsule (40 mg total) by mouth daily at 12 noon. 90 capsule 1  . famotidine (PEPCID) 20 MG tablet Take 1 tablet (20 mg total) by mouth 2 (two) times daily. 180 tablet 3  . FLUoxetine (PROZAC) 40 MG capsule Take 1 capsule (40 mg total) by mouth daily. 90 capsule 1  . levothyroxine (SYNTHROID) 25 MCG tablet Take 0.5 tablets (12.5 mcg total) by mouth daily before breakfast. 45 tablet 0  . MYRBETRIQ 25 MG TB24 tablet Take 50 mg  by mouth daily.     . TURMERIC PO Take by mouth.     No current facility-administered medications for this visit.     Physical Exam:     BP 124/76   Pulse (!) 57   Temp 98.1 F (36.7 C)   Ht 5' 2.5" (1.588 m)   Wt 172 lb 2 oz (78.1 kg)   LMP 10/18/1990   BMI 30.98 kg/m   GENERAL:  Pleasant female in NAD PSYCH: : Cooperative, normal affect EENT:  conjunctiva pink, mucous membranes moist, neck supple without masses CARDIAC:  RRR, no murmur heard, no peripheral edema PULM: Normal respiratory effort, lungs CTA bilaterally, no wheezing ABDOMEN:  Nondistended, soft, nontender. No obvious masses, no hepatomegaly,  normal bowel sounds SKIN:  turgor, no lesions seen Musculoskeletal:  Normal muscle tone, normal strength NEURO: Alert and oriented x 3, no focal neurologic deficits   IMPRESSION and PLAN:    1) GERD 2) Hiatal hernia 3) History of Barrett's esophagus  -Reflux well controlled on current therapy -Resume Pepcid/Nexium as prescribed -Resume antireflux lifestyle/dietary modifications -Due for repeat EGD for ongoing Barrett's surveillance -Schedule EGD with esophageal biopsies  4) Polyp surveillance - Last colonoscopy approximately 5 years ago notable for polyps with plan to repeat in 5 years per patient. - Schedule colonoscopy  The indications, risks, and benefits of EGD and colonoscopy were explained to the patient in detail. Risks include but are not limited to bleeding, perforation, adverse reaction to medications, and cardiopulmonary compromise. Sequelae include but are not limited to the possibility of surgery, hositalization, and mortality. The patient verbalized understanding and wished to proceed. All questions answered, referred to scheduler and bowel prep ordered. Further recommendations pending results of the exam.            Dominic Pea Cirigliano ,DO, FACG 07/27/2019, 9:45 AM

## 2019-07-27 NOTE — Patient Instructions (Signed)
If you are age 69 or older, your body mass index should be between 23-30. Your Body mass index is 30.98 kg/m. If this is out of the aforementioned range listed, please consider follow up with your Primary Care Provider.  If you are age 19 or younger, your body mass index should be between 19-25. Your Body mass index is 30.98 kg/m. If this is out of the aformentioned range listed, please consider follow up with your Primary Care Provider.   You have been scheduled for an endoscopy and colonoscopy. Please follow the written instructions given to you at your visit today. Please pick up your prep supplies at the pharmacy within the next 1-3 days. If you use inhalers (even only as needed), please bring them with you on the day of your procedure. Your physician has requested that you go to www.startemmi.com and enter the access code given to you at your visit today. This web site gives a general overview about your procedure. However, you should still follow specific instructions given to you by our office regarding your preparation for the procedure.  We have sent the following medications to your pharmacy for you to pick up at your convenience: Suprep  It was a pleasure to see you today!  Vito Cirigliano, D.O.

## 2019-07-31 ENCOUNTER — Ambulatory Visit: Payer: Medicare Other

## 2019-07-31 ENCOUNTER — Other Ambulatory Visit: Payer: Self-pay

## 2019-07-31 DIAGNOSIS — R2689 Other abnormalities of gait and mobility: Secondary | ICD-10-CM | POA: Diagnosis not present

## 2019-07-31 DIAGNOSIS — R2681 Unsteadiness on feet: Secondary | ICD-10-CM

## 2019-07-31 DIAGNOSIS — M6281 Muscle weakness (generalized): Secondary | ICD-10-CM | POA: Diagnosis not present

## 2019-07-31 NOTE — Patient Instructions (Signed)
Access Code: [email or web address]  URL: [email or web address]  Date: 07/31/2019  Prepared by: Cherly Anderson   Exercises Clamshell - 10 reps - 3 sets - 2 hold - 1x daily - 3x weekly Standing Soleus Stretch - 3 reps - 2-3 sets - 30-60 hold - 1x daily - 7x weekly CLX Ankle Dorsiflexion and Eversion - 10 reps - 2 sets - 1x daily - 5x weekly Romberg Stance on Foam Pad with Head Rotation - 10 reps - 1 sets - 1x daily - 7x weekly Romberg Stance with Head Nods on Foam Pad - 10 reps - 1 sets - 1x daily - 7x weekly Romberg Stance Eyes Closed on Foam Pad - 3 reps - 1 sets - 20 hold - 1x daily - 7x weekly Sidestepping - 1 reps - 3 sets - 2x daily - 5x weekly Walking March - 1 reps - 3 sets - 2x daily - 7x weekly

## 2019-07-31 NOTE — Therapy (Signed)
[facility] [address] [place], [place], [address] Phone: [phone]   Fax:  [phone]  Physical Therapy Treatment  Patient Details  Name: Lucielle Ito MRN: [id number] Date of Birth: July 13, 1951 Referring Provider (PT): Dr. Raoul Pitch   Encounter Date: 07/31/2019  PT End of Session - 07/31/19 1155    Visit Number  2    Number of Visits  17    Date for PT Re-Evaluation  10/22/19   written for 90 day POC   Authorization Type  Medicare + BCBS    PT Start Time  1150    PT Stop Time  1229    PT Time Calculation (min)  39 min    Equipment Utilized During Treatment  Gait belt    Activity Tolerance  Patient tolerated treatment well    Behavior During Therapy  WFL for tasks assessed/performed       Past Medical History:  Diagnosis Date  . Acid reflux   . Brain injury ([place]) 2015   Due to a fall   . 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 Chicken pox   . Constipation   . Depression   . Frequent headaches   . GERD (gastroesophageal reflux disease)   . 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 Hyperlipidemia   . Hypertension   . MS (multiple sclerosis) ([place]) 12/15/2016   possible dx. many MRI w/ positive white matter abnormality. MRI reported stable since 2015. mild progressive  Cognitive loss  . Obstructive sleep apnea    on prior neuro notes 10/2016; no results.   . Osteoporosis    had been on reclast  . Post concussion syndrome 10/2013   ongoing issues with concentration, memory loss, lability; EEG completed and normal.   . TBI (traumatic brain injury) ([place]) 2014   fall at [facility], hit head off of floor. No bleed, "concussion". Seen neurology since for focus and word finding.   Marland Kitchen TIA (transient ischemic attack) 11/30/2016   MRI normal. No lateralization. Speech affected and weakness during a hypotensive episdoe.   . Urinary incontinence     Past Surgical History:   Procedure Laterality Date  . BACK SURGERY  02/2015   herniated disc L4/5/6  . BREAST BIOPSY  1988   biopsy  . COLONOSCOPY     [place] Around 2015  . ESOPHAGOGASTRODUODENOSCOPY  2016   [place]  . LAPAROSCOPIC HYSTERECTOMY     partial   . SHOULDER ARTHROSCOPY W/ LABRAL REPAIR Left 02/13/2016   left shoulder  . Study: cognitive eval  06/23/2016   mild cog-linguistic impairment, difficulty with verbal fluency  . Study: EEG   04/12/2015   Studies: EEG completed and normal.   . Study: MRI  04/28/2016   Impression: numerous non-enhancing flair hyperintense lesions w/in the brain .Suspicious for demyelinating disease such as lyme or MS. Her Lyme test were negative.     There were no vitals filed for this visit.  Subjective Assessment - 07/31/19 1153    Subjective  Pt reports that she still struggles with managing her schedule so showed up very early to session.    Pertinent History  depression, headaches, HTN, MS (possible dx), OSA, post-consussion syndrome, TBI, TIA, osteopenia    Patient Stated Goals  wants to have better balance, feels more sure with herself - doesn't want to be scared of falling    Currently in Pain?  Yes    Pain Score  4     Pain Location  Back    Pain Orientation  Lower    Pain Descriptors / Indicators  Aching    Pain Type  Chronic pain    Pain Onset  More than a month ago                       [facility] Adult PT Treatment/Exercise - 07/31/19 1225      Transfers   Transfers  Sit to Stand;Stand to Sit    Sit to Stand  7: Independent    Stand to Sit  7: Independent      Ambulation/Gait   Ambulation/Gait  Yes    Ambulation Distance (Feet)  345 Feet    Assistive device  None    Gait Pattern  Decreased dorsiflexion - left;Step-through pattern    Ambulation Surface  Level;Indoor    Gait Comments  Verbal cues to increase heel strike resulted in longer step length.      Neuro Re-ed    Neuro Re-ed Details   Pt performed standing on  pillow in corner: eyes open x 30 sec, added head rotations x 10 and up/down x 10. Increased sway with up and down. Standing on pillow with feet together eyes closed x 30 sec. Marching on pillow without UE support x 10. Along counter: marching gait 8' x 4, sidestepping 8' x 4 with verbal cues for large controlled steps. Pt was challenged with marching gait unsteady at times. Instructed to gently touch counter when perform at home.      Exercises   Exercises  Other Exercises    Other Exercises   Sidelying clamshell x 10 each side with verbal cues for technique. Pt reported feeling it on side of hip like she should. Standing gastroc stretch at wall 30 sec each side.              PT Education - 07/31/19 1257    Education Details  PT reviewed and added exercises to HEP from prior therapy. Wrote out specifics for frequency to help patient navigate easier which she said will help.    Person(s) Educated  Patient    Methods  Explanation;Demonstration;Handout    Comprehension  Verbalized understanding;Returned demonstration       PT Short Term Goals - 07/25/19 1300      PT SHORT TERM GOAL #1   Title  Pt will be IND with HEP in order to build upon functional gains made in therapy. ALL STGS DUE 08/24/19    Time  4    Period  Weeks    Status  New    Target Date  08/24/19      PT SHORT TERM GOAL #2   Title  Patient will improve FGA score to at least 19/30 in order to demonstrate decreased fall risk.    Baseline  16/30 on 07/25/19    Time  4    Period  Weeks    Status  New      PT SHORT TERM GOAL #3   Title  Pt will perform floor<>stand txfs with supervision and with UE support to improve safety.    Baseline  not yet assessed.    Time  4    Period  Weeks    Status  New      PT SHORT TERM GOAL #4   Title  Patient will begin aquatic therapy PT in order to improve strength, balance, and gait.    Time  4    Period  Weeks    Status  New      PT SHORT TERM GOAL #5   Title  Patient will  ambulate at least 230' over indoor/level surfaces while scanning environment with supervision with no LOB.    Time  4    Period  Weeks    Status  New        PT Long Term Goals - 07/25/19 1303      PT LONG TERM GOAL #1   Title  Pt will be independent with final HEP (including aquatics) in order to indicate improved balance and strength. ALL LTGS DUE 09/23/19    Time  8    Period  Weeks    Status  New    Target Date  09/23/19      PT LONG TERM GOAL #2   Title  Patient will perform diaphragmatic breathing techniques in supine/sitting with no verbal cues in order to calm down nervous system and report decreased feelings of being overwhelmed.    Time  8    Period  Weeks    Status  New      PT LONG TERM GOAL #3   Title  Patient will improve FGA to at least 23/30 in order to demo decreased fall risk.    Baseline  16/30 on 07/25/19    Time  8    Period  Weeks    Status  New      PT LONG TERM GOAL #4   Title  Patient will ambulate at least 500' outdoors on paved surfaces while scanning environment with no LOB and suprevision in order to demonstrate improved community mobility.    Time  8    Period  Weeks    Status  New      PT LONG TERM GOAL #5   Title  Patient will perform 12 steps with single handrail on R and step through vs. step to pattern with supervision in order to demonstrate improved safety while going up and down her flight of stairs at home.    Time  8    Period  Weeks    Status  New            Plan - 07/31/19 1258    Clinical Impression Statement  Pt needs reminders for technique with exercises due to history of cognitive issues with memory from TBI. Pt did well with encouragement during session so that she did not focus on things that were challenging and get frustrated. Pt was challenged with head turns up/down on foam as well as dynamic gait activities along counter with marching needing UE support at times. Improved foot clearance and step length with cues to  use more heel strike. Will probably need continued reinforcement.    Personal Factors and Comorbidities  Time since onset of injury/illness/exacerbation;Past/Current Experience;Comorbidity 3+    Comorbidities  depression, headaches, HTN, MS (possible dx), OSA, post-consussion syndrome, TBI, TIA, osteopenia    Examination-Activity Limitations  Stairs;Locomotion Level    Examination-Participation Restrictions  Community Activity    Stability/Clinical Decision Making  Stable/Uncomplicated    Rehab Potential  Good    PT Frequency  2x / week    PT Duration  8 weeks    PT Treatment/Interventions  ADLs/Self Care Home Management;Gait training;Stair training;Functional mobility training;Therapeutic activities;Therapeutic exercise;Balance training;Patient/family education;Neuromuscular re-education;Manual techniques;Aquatic Therapy    PT Next Visit Plan  Next visit work on ankle and hip strategies. Try rocker board. Continue with  dynamic gait and balance exercises. See how HEP has restarted.    PT Home Exercise Plan  [email or web address]    Consulted and Agree with Plan of Care  Patient       Patient will benefit from skilled therapeutic intervention in order to improve the following deficits and impairments:  Decreased balance, Decreased activity tolerance, Decreased mobility, Decreased strength, Difficulty walking, Abnormal gait  Visit Diagnosis: Unsteadiness on feet  Other abnormalities of gait and mobility  Muscle weakness (generalized)     Problem List Patient Active Problem List   Diagnosis Date Noted  . CKD (chronic kidney disease) stage 3, GFR 30-59 ml/min 05/08/2019  . GERD (gastroesophageal reflux disease) 05/07/2019  . Overactive bladder 11/26/2018  . Raynaud disease 10/06/2018  . Encounter for monitoring long-term proton pump inhibitor therapy 08/28/2018  . White matter disease 02/20/2018  . Osteoporosis   . Hypercalcemia 01/17/2018  . Essential hypertension 01/16/2018  . Hypothyroidism  01/16/2018  . Depression with anxiety 01/16/2018  . Traumatic brain injury with loss of consciousness ([place]) 01/16/2018  . Hyperlipidemia     Electa Sniff, PT, DPT, NCS 07/31/2019, 1:04 PM  [facility] [address] [place], [place], [address] Phone: [phone]   Fax:  [phone]  Name: Jaimelynn Dimmick MRN: [id number] Date of Birth: 1951-04-23

## 2019-08-02 ENCOUNTER — Ambulatory Visit: Payer: Medicare Other

## 2019-08-02 ENCOUNTER — Other Ambulatory Visit: Payer: Self-pay

## 2019-08-02 DIAGNOSIS — M6281 Muscle weakness (generalized): Secondary | ICD-10-CM | POA: Diagnosis not present

## 2019-08-02 DIAGNOSIS — R2689 Other abnormalities of gait and mobility: Secondary | ICD-10-CM | POA: Diagnosis not present

## 2019-08-02 DIAGNOSIS — R2681 Unsteadiness on feet: Secondary | ICD-10-CM | POA: Diagnosis not present

## 2019-08-02 NOTE — Therapy (Signed)
[facility] [address] [place], [place], [address] Phone: [phone]   Fax:  [phone]  Physical Therapy Treatment  Patient Details  Name: Abigail Michael MRN: [id number] Date of Birth: 1951/04/15 Referring Provider (PT): Dr. Raoul Pitch   Encounter Date: 08/02/2019  PT End of Session - 08/02/19 1154    Visit Number  3    Number of Visits  17    Date for PT Re-Evaluation  10/22/19   written for 90 day POC   Authorization Type  Medicare + BCBS    PT Start Time  1150    PT Stop Time  1228    PT Time Calculation (min)  38 min    Equipment Utilized During Treatment  Gait belt    Activity Tolerance  Patient tolerated treatment well    Behavior During Therapy  WFL for tasks assessed/performed       Past Medical History:  Diagnosis Date  . Acid reflux   . Brain injury ([place]) 2015   Due to a fall   . 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 Chicken pox   . Constipation   . Depression   . Frequent headaches   . GERD (gastroesophageal reflux disease)   . 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 Hyperlipidemia   . Hypertension   . MS (multiple sclerosis) ([place]) 12/15/2016   possible dx. many MRI w/ positive white matter abnormality. MRI reported stable since 2015. mild progressive  Cognitive loss  . Obstructive sleep apnea    on prior neuro notes 10/2016; no results.   . Osteoporosis    had been on reclast  . Post concussion syndrome 10/2013   ongoing issues with concentration, memory loss, lability; EEG completed and normal.   . TBI (traumatic brain injury) ([place]) 2014   fall at [facility], hit head off of floor. No bleed, "concussion". Seen neurology since for focus and word finding.   Marland Kitchen TIA (transient ischemic attack) 11/30/2016   MRI normal. No lateralization. Speech affected and weakness during a hypotensive episdoe.   . Urinary incontinence     Past Surgical History:   Procedure Laterality Date  . BACK SURGERY  02/2015   herniated disc L4/5/6  . BREAST BIOPSY  1988   biopsy  . COLONOSCOPY     [place]. Around 2015  . ESOPHAGOGASTRODUODENOSCOPY  2016   [place]  . LAPAROSCOPIC HYSTERECTOMY     partial   . SHOULDER ARTHROSCOPY W/ LABRAL REPAIR Left 02/13/2016   left shoulder  . Study: cognitive eval  06/23/2016   mild cog-linguistic impairment, difficulty with verbal fluency  . Study: EEG   04/12/2015   Studies: EEG completed and normal.   . Study: MRI  04/28/2016   Impression: numerous non-enhancing flair hyperintense lesions w/in the brain .Suspicious for demyelinating disease such as lyme or MS. Her Lyme test were negative.     There were no vitals filed for this visit.  Subjective Assessment - 08/02/19 1154    Subjective  Pt reports she is a little tired from doing her exercises yesterday.    Pertinent History  depression, headaches, HTN, MS (possible dx), OSA, post-consussion syndrome, TBI, TIA, osteopenia    Patient Stated Goals  wants to have better balance, feels more sure with herself - doesn't want to be scared of falling    Currently in Pain?  Yes    Pain Score  7  4/10 ankles   Pain Location  Back    Pain Orientation  Lower    Pain Descriptors / Indicators  Aching;Sore    Pain Onset  More than a month ago                       [facility] Adult PT Treatment/Exercise - 08/02/19 1155      Ambulation/Gait   Ambulation/Gait  Yes    Ambulation/Gait Assistance  7: Independent    Ambulation Distance (Feet)  230 Feet    Assistive device  None    Gait Pattern  Step-through pattern    Ambulation Surface  Level;Indoor    Gait Comments  Improved course with gait walking at end of session with no episodes of staggering to the side and ankles staying straighter.      Neuro Re-ed    Neuro Re-ed Details   Standing on foam feet together with eyes open and eyes closed x 30 sec each without UE support, standing in  staggered stance on airex 30 sec x 2 each position with occasional UE support. Rockerboard: keeping level with positioned for ant/post shift x 10 min, weight shifting ant/posterior with only occasional UE support, keeping level with moving arms up/out to side/across chest x 10. Tandem gait along counter CGA as unsteady at times 6' x 4, marching gait 6' x 4, side stepping 6' x 4.             PT Education - 08/02/19 1308    Education Details  PT added tandem stance to HEP.    Person(s) Educated  Patient    Methods  Explanation;Demonstration;Handout    Comprehension  Verbalized understanding;Returned demonstration       PT Short Term Goals - 07/25/19 1300      PT SHORT TERM GOAL #1   Title  Pt will be IND with HEP in order to build upon functional gains made in therapy. ALL STGS DUE 08/24/19    Time  4    Period  Weeks    Status  New    Target Date  08/24/19      PT SHORT TERM GOAL #2   Title  Patient will improve FGA score to at least 19/30 in order to demonstrate decreased fall risk.    Baseline  16/30 on 07/25/19    Time  4    Period  Weeks    Status  New      PT SHORT TERM GOAL #3   Title  Pt will perform floor<>stand txfs with supervision and with UE support to improve safety.    Baseline  not yet assessed.    Time  4    Period  Weeks    Status  New      PT SHORT TERM GOAL #4   Title  Patient will begin aquatic therapy PT in order to improve strength, balance, and gait.    Time  4    Period  Weeks    Status  New      PT SHORT TERM GOAL #5   Title  Patient will ambulate at least 230' over indoor/level surfaces while scanning environment with supervision with no LOB.    Time  4    Period  Weeks    Status  New        PT Long Term Goals - 07/25/19 1303      PT LONG TERM GOAL #1   Title  Pt will  be independent with final HEP (including aquatics) in order to indicate improved balance and strength. ALL LTGS DUE 09/23/19    Time  8    Period  Weeks    Status  New     Target Date  09/23/19      PT LONG TERM GOAL #2   Title  Patient will perform diaphragmatic breathing techniques in supine/sitting with no verbal cues in order to calm down nervous system and report decreased feelings of being overwhelmed.    Time  8    Period  Weeks    Status  New      PT LONG TERM GOAL #3   Title  Patient will improve FGA to at least 23/30 in order to demo decreased fall risk.    Baseline  16/30 on 07/25/19    Time  8    Period  Weeks    Status  New      PT LONG TERM GOAL #4   Title  Patient will ambulate at least 500' outdoors on paved surfaces while scanning environment with no LOB and suprevision in order to demonstrate improved community mobility.    Time  8    Period  Weeks    Status  New      PT LONG TERM GOAL #5   Title  Patient will perform 12 steps with single handrail on R and step through vs. step to pattern with supervision in order to demonstrate improved safety while going up and down her flight of stairs at home.    Time  8    Period  Weeks    Status  New            Plan - 08/02/19 1308    Clinical Impression Statement  Pt was able to improve gait quality from walking in to session and at end with less staggering and better ankle control. Pt was challenged by tandem walking. Pt reported feeling very encouraged after session feeling good. No increase in ankle or back pain with activities.    Personal Factors and Comorbidities  Time since onset of injury/illness/exacerbation;Past/Current Experience;Comorbidity 3+    Comorbidities  depression, headaches, HTN, MS (possible dx), OSA, post-consussion syndrome, TBI, TIA, osteopenia    Examination-Activity Limitations  Stairs;Locomotion Level    Examination-Participation Restrictions  Community Activity    Stability/Clinical Decision Making  Stable/Uncomplicated    Rehab Potential  Good    PT Frequency  2x / week    PT Duration  8 weeks    PT Treatment/Interventions  ADLs/Self Care Home  Management;Gait training;Stair training;Functional mobility training;Therapeutic activities;Therapeutic exercise;Balance training;Patient/family education;Neuromuscular re-education;Manual techniques;Aquatic Therapy    PT Next Visit Plan  Continue with ankle and hip strategy training with rocker board and foam, dynamic balance    PT Home Exercise Plan  [email or web address]    Consulted and Agree with Plan of Care  Patient       Patient will benefit from skilled therapeutic intervention in order to improve the following deficits and impairments:  Decreased balance, Decreased activity tolerance, Decreased mobility, Decreased strength, Difficulty walking, Abnormal gait  Visit Diagnosis: Other abnormalities of gait and mobility  Muscle weakness (generalized)     Problem List Patient Active Problem List   Diagnosis Date Noted  . CKD (chronic kidney disease) stage 3, GFR 30-59 ml/min 05/08/2019  . GERD (gastroesophageal reflux disease) 05/07/2019  . Overactive bladder 11/26/2018  . Raynaud disease 10/06/2018  . Encounter for monitoring long-term proton pump inhibitor therapy 08/28/2018  .  White matter disease 02/20/2018  . Osteoporosis   . Hypercalcemia 01/17/2018  . Essential hypertension 01/16/2018  . Hypothyroidism 01/16/2018  . Depression with anxiety 01/16/2018  . Traumatic brain injury with loss of consciousness ([place]) 01/16/2018  . Hyperlipidemia     Electa Sniff, PT, DPT, NCS 08/02/2019, 1:12 PM  [facility] [address] [place], [place], [address] Phone: [phone]   Fax:  [phone]  Name: Jaylianna Picklesimer MRN: [id number] Date of Birth: 07-Jun-1951

## 2019-08-02 NOTE — Patient Instructions (Signed)
Access Code: [email or web address]  URL: [email or web address]  Date: 08/02/2019  Prepared by: Cherly Anderson   Exercises Clamshell - 10 reps - 3 sets - 2 hold - 1x daily - 3x weekly Standing Soleus Stretch - 3 reps - 2-3 sets - 30-60 hold - 1x daily - 7x weekly CLX Ankle Dorsiflexion and Eversion - 10 reps - 2 sets - 1x daily - 5x weekly Romberg Stance on Foam Pad with Head Rotation - 10 reps - 1 sets - 1x daily - 7x weekly Romberg Stance with Head Nods on Foam Pad - 10 reps - 1 sets - 1x daily - 7x weekly Romberg Stance Eyes Closed on Foam Pad - 3 reps - 1 sets - 20 hold - 1x daily - 7x weekly Sidestepping - 1 reps - 3 sets - 2x daily - 5x weekly Walking March - 1 reps - 3 sets - 2x daily - 7x weekly Tandem Stance - 3 reps - 2 sets - 30sec hold - 1x daily - 7x weekly

## 2019-08-07 ENCOUNTER — Encounter: Payer: Self-pay | Admitting: Physical Therapy

## 2019-08-07 ENCOUNTER — Other Ambulatory Visit: Payer: Self-pay

## 2019-08-07 ENCOUNTER — Ambulatory Visit: Payer: Medicare Other | Admitting: Physical Therapy

## 2019-08-07 DIAGNOSIS — M6281 Muscle weakness (generalized): Secondary | ICD-10-CM

## 2019-08-07 DIAGNOSIS — R2681 Unsteadiness on feet: Secondary | ICD-10-CM

## 2019-08-07 DIAGNOSIS — R2689 Other abnormalities of gait and mobility: Secondary | ICD-10-CM

## 2019-08-07 NOTE — Therapy (Signed)
[facility] [address] [place], [place], [address] Phone: [phone]   Fax:  [phone]  Physical Therapy Treatment  Patient Details  Name: Abigail Michael MRN: [id number] Date of Birth: November 27, 1950 Referring Provider (PT): Dr. Raoul Pitch   Encounter Date: 08/07/2019  PT End of Session - 08/07/19 1244    Visit Number  4    Number of Visits  17    Date for PT Re-Evaluation  10/22/19   written for 90 day POC   Authorization Type  Medicare + BCBS    PT Start Time  1155    PT Stop Time  1235    PT Time Calculation (min)  40 min    Equipment Utilized During Treatment  Gait belt    Activity Tolerance  Patient tolerated treatment well    Behavior During Therapy  [facility] for tasks assessed/performed       Past Medical History:  Diagnosis Date  . Acid reflux   . Brain injury ([place]) 2015   Due to a fall   . 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 Chicken pox   . Constipation   . Depression   . Frequent headaches   . GERD (gastroesophageal reflux disease)   . 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 Hyperlipidemia   . Hypertension   . MS (multiple sclerosis) ([place]) 12/15/2016   possible dx. many MRI w/ positive white matter abnormality. MRI reported stable since 2015. mild progressive  Cognitive loss  . Obstructive sleep apnea    on prior neuro notes 10/2016; no results.   . Osteoporosis    had been on reclast  . Post concussion syndrome 10/2013   ongoing issues with concentration, memory loss, lability; EEG completed and normal.   . TBI (traumatic brain injury) ([place]) 2014   fall at [facility], hit head off of floor. No bleed, "concussion". Seen neurology since for focus and word finding.   Marland Kitchen TIA (transient ischemic attack) 11/30/2016   MRI normal. No lateralization. Speech affected and weakness during a hypotensive episdoe.   . Urinary incontinence     Past Surgical History:   Procedure Laterality Date  . BACK SURGERY  02/2015   herniated disc L4/5/6  . BREAST BIOPSY  1988   biopsy  . COL[place]OSCOPY     [place] Around 2015  . ESOPHAGOGASTRODUODENOSCOPY  2016   [place]  . LAPAROSCOPIC HYSTERECTOMY     partial   . SHOULDER ARTHROSCOPY W/ LABRAL REPAIR Left 02/13/2016   left shoulder  . Study: cognitive eval  06/23/2016   mild cog-linguistic impairment, difficulty with verbal fluency  . Study: EEG   04/12/2015   Studies: EEG completed and normal.   . Study: MRI  04/28/2016   Impression: numerous non-enhancing flair hyperintense lesions w/in the brain .Suspicious for demyelinating disease such as lyme or MS. Her Lyme test were negative.     There were no vitals filed for this visit.  Subjective Assessment - 08/07/19 1201    Subjective  Exercises are going well at home - reports that they feel a little difficult. Is excited for aquatic therapy tomorrow.    Pertinent History  depression, headaches, HTN, MS (possible dx), OSA, post-consussion syndrome, TBI, TIA, osteopenia    Patient Stated Goals  wants to have better balance, feels more sure with herself - doesn't want to be scared of falling    Currently in Pain?  Yes  Pain Score  5    3/10 for ankles   Pain Location  Back    Pain Orientation  Lower    Pain Descriptors / Indicators  Aching;Sore    Pain Onset  More than a month ago                        Balance Exercises - 08/07/19 1245      Balance Exercises: Standing   Standing Eyes Opened  Foam/compliant surface;2 reps;Narrow base of support (BOS)   2 x 10 vertical head nods, horizontal head turns   Standing Eyes Closed  Foam/compliant surface;3 reps;30 secs;Wide (BOA)    Other Standing Exercises  On blue foam: narrow BOS with eyes closed 3 x 15 seconds, 2 x 10 reps alternating forward stepping strategy onto colored discs, followed by 1 x 10 reps cross body step taps to colorful disc. On level surface SLS toe taps to  2 colorful foam bubbles on foam, massed practice - pt with increased difficulty performing standing on LLE. On rocker board A/P weight shifting with no UE support 1 x 10 reps, holding board in middle 1 x 7 reps alternating UE lifts.         PT Education - 08/07/19 1250    Education Details  discussed with pt scheduling for aquatic therapy - Vinnie Level, PT present during schedule to help with scheduling. pt will keep Tuesday appointments for land therapy and Wednesdays for aquatic therapy, will cancel remainder of Thursday PT session to maintain POC for 2x week, provided pt handout of what to expect for aquatic therapy at [facility] (pt starts tomorrow)    Person(s) Educated  Patient    Methods  Explanation;Demonstration    Comprehension  Verbalized understanding;Returned demonstration       PT Short Term Goals - 07/25/19 1300      PT SHORT TERM GOAL #1   Title  Pt will be IND with HEP in order to build upon functional gains made in therapy. ALL STGS DUE 08/24/19    Time  4    Period  Weeks    Status  New    Target Date  08/24/19      PT SHORT TERM GOAL #2   Title  Patient will improve FGA score to at least 19/30 in order to demonstrate decreased fall risk.    Baseline  16/30 on 07/25/19    Time  4    Period  Weeks    Status  New      PT SHORT TERM GOAL #3   Title  Pt will perform floor<>stand txfs with supervision and with UE support to improve safety.    Baseline  not yet assessed.    Time  4    Period  Weeks    Status  New      PT SHORT TERM GOAL #4   Title  Patient will begin aquatic therapy PT in order to improve strength, balance, and gait.    Time  4    Period  Weeks    Status  New      PT SHORT TERM GOAL #5   Title  Patient will ambulate at least 230' over indoor/level surfaces while scanning environment with supervision with no LOB.    Time  4    Period  Weeks    Status  New        PT Long Term Goals - 07/25/19 1303  PT LONG TERM GOAL #1   Title  Pt will be  independent with final HEP (including aquatics) in order to indicate improved balance and strength. ALL LTGS DUE 09/23/19    Time  8    Period  Weeks    Status  New    Target Date  09/23/19      PT LONG TERM GOAL #2   Title  Patient will perform diaphragmatic breathing techniques in supine/sitting with no verbal cues in order to calm down nervous system and report decreased feelings of being overwhelmed.    Time  8    Period  Weeks    Status  New      PT LONG TERM GOAL #3   Title  Patient will improve FGA to at least 23/30 in order to demo decreased fall risk.    Baseline  16/30 on 07/25/19    Time  8    Period  Weeks    Status  New      PT LONG TERM GOAL #4   Title  Patient will ambulate at least 500' outdoors on paved surfaces while scanning environment with no LOB and suprevision in order to demonstrate improved community mobility.    Time  8    Period  Weeks    Status  New      PT LONG TERM GOAL #5   Title  Patient will perform 12 steps with single handrail on R and step through vs. step to pattern with supervision in order to demonstrate improved safety while going up and down her flight of stairs at home.    Time  8    Period  Weeks    Status  New            Plan - 08/07/19 1253    Clinical Impression Statement  Focus of today's skilled session was ankle balance strategies on compliant surfaces with eyes open/eyes closed. Pt demonstrates decreased vestibular input for balance - is unable to hold feet together eyes closed for 30 seconds on foam. Pt reporting mild dizziness after performing head turns right and left and rated a 2/10 - able to subside immediately when resting. Pt continues to demonstrate difficulty with SLS on LLE. Will continue to progress towards LTGs.    Personal Factors and Comorbidities  Time since onset of injury/illness/exacerbation;Past/Current Experience;Comorbidity 3+    Comorbidities  depression, headaches, HTN, MS (possible dx), OSA,  post-consussion syndrome, TBI, TIA, osteopenia    Examination-Activity Limitations  Stairs;Locomotion Level    Examination-Participation Restrictions  Community Activity    Stability/Clinical Decision Making  Stable/Uncomplicated    Rehab Potential  Good    PT Frequency  2x / week    PT Duration  8 weeks    PT Treatment/Interventions  ADLs/Self Care Home Management;Gait training;Stair training;Functional mobility training;Therapeutic activities;Therapeutic exercise;Balance training;Patient/family education;Neuromuscular re-education;Manual techniques;Aquatic Therapy    PT Next Visit Plan  Continue with ankle and hip strategy training with rocker board and foam, dynamic balance. SLS, balance focused on vestibular input.    PT Home Exercise Plan  [email or web address]    Consulted and Agree with Plan of Care  Patient       Patient will benefit from skilled therapeutic intervention in order to improve the following deficits and impairments:  Decreased balance, Decreased activity tolerance, Decreased mobility, Decreased strength, Difficulty walking, Abnormal gait  Visit Diagnosis: Other abnormalities of gait and mobility  Muscle weakness (generalized)  Unsteadiness on feet     Problem List  Patient Active Problem List   Diagnosis Date Noted  . CKD (chronic kidney disease) stage 3, GFR 30-59 ml/min 05/08/2019  . GERD (gastroesophageal reflux disease) 05/07/2019  . Overactive bladder 11/26/2018  . Raynaud disease 10/06/2018  . Encounter for monitoring long-term proton pump inhibitor therapy 08/28/2018  . White matter disease 02/20/2018  . Osteoporosis   . Hypercalcemia 01/17/2018  . Essential hypertension 01/16/2018  . Hypothyroidism 01/16/2018  . Depression with anxiety 01/16/2018  . Traumatic brain injury with loss of consciousness ([place]) 01/16/2018  . Hyperlipidemia     Arliss Journey, PT, DPT  08/07/2019, 12:56 PM  [facility] [address] [place], [place], [address] Phone: [phone]   Fax:  [phone]  Name: Abigail Michael MRN: [id number] Date of Birth: 06-Apr-1951

## 2019-08-08 ENCOUNTER — Ambulatory Visit: Payer: Medicare Other | Admitting: Physical Therapy

## 2019-08-08 DIAGNOSIS — R2681 Unsteadiness on feet: Secondary | ICD-10-CM | POA: Diagnosis not present

## 2019-08-08 DIAGNOSIS — M6281 Muscle weakness (generalized): Secondary | ICD-10-CM

## 2019-08-08 DIAGNOSIS — R2689 Other abnormalities of gait and mobility: Secondary | ICD-10-CM | POA: Diagnosis not present

## 2019-08-08 NOTE — Therapy (Signed)
[facility] [address] [place], [place], [address] Phone: [phone]   Fax:  [phone]  Physical Therapy Treatment  Patient Details  Name: Abigail Michael MRN: [id number] Date of Birth: [birth year] Referring Provider (PT): Dr. Raoul Pitch   Encounter Date: 08/08/2019  PT End of Session - 08/08/19 2107    Visit Number  5    Number of Visits  17    Date for PT Re-Evaluation  10/22/19   written for 90 day POC   Authorization Type  Medicare + BCBS    PT Start Time  1504    PT Stop Time  1545    PT Time Calculation (min)  41 min    Activity Tolerance  Patient tolerated treatment well    Behavior During Therapy  [facility] for tasks assessed/performed       Past Medical History:  Diagnosis Date  . Acid reflux   . Brain injury ([place]) 2015   Due to a fall   . 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 Chicken pox   . Constipation   . Depression   . Frequent headaches   . GERD (gastroesophageal reflux disease)   . 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 Hyperlipidemia   . Hypertension   . MS (multiple sclerosis) ([place]) 12/15/2016   possible dx. many MRI w/ positive white matter abnormality. MRI reported stable since 2015. mild progressive  Cognitive loss  . Obstructive sleep apnea    on prior neuro notes 10/2016; no results.   . Osteoporosis    had been on reclast  . Post concussion syndrome 10/2013   ongoing issues with concentration, memory loss, lability; EEG completed and normal.   . TBI (traumatic brain injury) ([place]) 2014   fall at [facility], hit head off of floor. No bleed, "concussion". Seen neurology since for focus and word finding.   Marland Kitchen TIA (transient ischemic attack) 11/30/2016   MRI normal. No lateralization. Speech affected and weakness during a hypotensive episdoe.   . Urinary incontinence     Past Surgical History:  Procedure Laterality Date  . BACK SURGERY  02/2015   herniated disc L4/5/6  . BREAST BIOPSY  1988   biopsy  . COLONOSCOPY     [place]. Around 2015  . ESOPHAGOGASTRODUODENOSCOPY  2016   [place]  . LAPAROSCOPIC HYSTERECTOMY     partial   . SHOULDER ARTHROSCOPY W/ LABRAL REPAIR Left 02/13/2016   left shoulder  . Study: cognitive eval  06/23/2016   mild cog-linguistic impairment, difficulty with verbal fluency  . Study: EEG   04/12/2015   Studies: EEG completed and normal.   . Study: MRI  04/28/2016   Impression: numerous non-enhancing flair hyperintense lesions w/in the brain .Suspicious for demyelinating disease such as lyme or MS. Her Lyme test were negative.     There were no vitals filed for this visit.  Subjective Assessment - 08/08/19 2105    Subjective  States she feels like her balance is worse than when she start aquatics before stopping due to [place].  States she likes to be challenged.    Pertinent History  depression, headaches, HTN, MS (possible dx), OSA, post-consussion syndrome, TBI, TIA, osteopenia    Patient Stated Goals  wants to have better balance, feels more sure with herself - doesn't want to be scared of falling    Currently in Pain?  No/denies    Pain Onset  More than  a month ago       Aquatic therapy - pool temp 87.4 degrees   Performed runners stretch at edge of pool holding side x 30 sec each side and bil toes up wall x 30 sec for stretch. Patient seen for aquatic therapy today.  Treatment took place in water 2.5-4 feet deep depending upon activity.  Pt entered the pool via step negotiation modified independently with use of hand rails.   Pt performed walking forward in water x 4m, backwards x 9m, side stepping with arms at side x 46m, side stepping with squat x 25 m with slowed down movement and verbal cues to maintain proper sequence and slow down for balance.  Pt performed bil. Hip strengthening exercises in standing with UE support on pool wall - hip flexion, extension, abduction, hamstring  curl and hip flexion<>hip extension with use of ankle buoyant cuff weight for increased resistance with eccentric motion - 15 reps each    Pt performed heel raises x 20 reps; marching in place 20 reps   Pt performed standing with feet apart with head turns  - EO and then with EC; feet together with EO with horizontal and vertical head turns, then with EC; use of buoyancy of water for support with these balance exercises to be performed safely without risk of falling  Performed SLS x 10 sec x 2 reps each side, tandem standing each direction x 10 sec x 2 reps.  Tandem walking 10' x 2 reps.   Standing at steps with rail for support for bil taps to 6" step then 6", 12", 6" and floor x 10 reps of each.   Ai Chi postures for core stabilization, strengthening and balance training - Enclosing and Soothing 10 reps each with mild LOB.  Verbal and visual cues to maintain sequencing.  Repeated stretches at end of session as performed at beginning.   Pt requires buoyancy of water for support with balance exs. and viscosity of water for resistance for strengthening exs.  Water provides safe environment to perform balance exercises safely without risk of fall without UE support    PT Short Term Goals - 07/25/19 1300      PT SHORT TERM GOAL #1   Title  Pt will be IND with HEP in order to build upon functional gains made in therapy. ALL STGS DUE 08/24/19    Time  4    Period  Weeks    Status  New    Target Date  08/24/19      PT SHORT TERM GOAL #2   Title  Patient will improve FGA score to at least 19/30 in order to demonstrate decreased fall risk.    Baseline  16/30 on 07/25/19    Time  4    Period  Weeks    Status  New      PT SHORT TERM GOAL #3   Title  Pt will perform floor<>stand txfs with supervision and with UE support to improve safety.    Baseline  not yet assessed.    Time  4    Period  Weeks    Status  New      PT SHORT TERM GOAL #4   Title  Patient will begin aquatic therapy PT in  order to improve strength, balance, and gait.    Time  4    Period  Weeks    Status  New      PT SHORT TERM GOAL #5   Title  Patient will ambulate at least 230' over indoor/level surfaces while scanning environment with supervision with no LOB.    Time  4    Period  Weeks    Status  New        PT Long Term Goals - 07/25/19 1303      PT LONG TERM GOAL #1   Title  Pt will be independent with final HEP (including aquatics) in order to indicate improved balance and strength. ALL LTGS DUE 09/23/19    Time  8    Period  Weeks    Status  New    Target Date  09/23/19      PT LONG TERM GOAL #2   Title  Patient will perform diaphragmatic breathing techniques in supine/sitting with no verbal cues in order to calm down nervous system and report decreased feelings of being overwhelmed.    Time  8    Period  Weeks    Status  New      PT LONG TERM GOAL #3   Title  Patient will improve FGA to at least 23/30 in order to demo decreased fall risk.    Baseline  16/30 on 07/25/19    Time  8    Period  Weeks    Status  New      PT LONG TERM GOAL #4   Title  Patient will ambulate at least 500' outdoors on paved surfaces while scanning environment with no LOB and suprevision in order to demonstrate improved community mobility.    Time  8    Period  Weeks    Status  New      PT LONG TERM GOAL #5   Title  Patient will perform 12 steps with single handrail on R and step through vs. step to pattern with supervision in order to demonstrate improved safety while going up and down her flight of stairs at home.    Time  8    Period  Weeks    Status  New            Plan - 08/08/19 2107    Clinical Impression Statement  Pt tolerated first aquatic session well with pt demonstrating decreased trunk control with balance and trunk stabilization required for Ai Chi postures.  Pt required intermittent UE support at times and uses bil UE's significantly throughout session by moving them in the water to  balance self.  Pt needed verbal and visual cues with exercises requiring coordination.  Pt reports h/o TBI impacting her coordination at times and sequencing.    Personal Factors and Comorbidities  Time since onset of injury/illness/exacerbation;Past/Current Experience;Comorbidity 3+    Comorbidities  depression, headaches, HTN, MS (possible dx), OSA, post-consussion syndrome, TBI, TIA, osteopenia    Examination-Activity Limitations  Stairs;Locomotion Level    Examination-Participation Restrictions  Community Activity    Stability/Clinical Decision Making  Stable/Uncomplicated    Rehab Potential  Good    PT Frequency  2x / week    PT Duration  8 weeks    PT Treatment/Interventions  ADLs/Self Care Home Management;Gait training;Stair training;Functional mobility training;Therapeutic activities;Therapeutic exercise;Balance training;Patient/family education;Neuromuscular re-education;Manual techniques;Aquatic Therapy    PT Next Visit Plan  Continue with ankle and hip strategy training with rocker board and foam, dynamic balance. SLS, balance focused on vestibular input.    PT Home Exercise Plan  [email or web address]    Consulted and Agree with Plan of Care  Patient       Patient will benefit from skilled  therapeutic intervention in order to improve the following deficits and impairments:  Decreased balance, Decreased activity tolerance, Decreased mobility, Decreased strength, Difficulty walking, Abnormal gait  Visit Diagnosis: Other abnormalities of gait and mobility  Muscle weakness (generalized)  Unsteadiness on feet     Problem List Patient Active Problem List   Diagnosis Date Noted  . CKD (chronic kidney disease) stage 3, GFR 30-59 ml/min 05/08/2019  . GERD (gastroesophageal reflux disease) 05/07/2019  . Overactive bladder 11/26/2018  . Raynaud disease 10/06/2018  . Encounter for monitoring long-term proton pump inhibitor therapy 08/28/2018  . White matter disease 02/20/2018  . Osteoporosis    . Hypercalcemia 01/17/2018  . Essential hypertension 01/16/2018  . Hypothyroidism 01/16/2018  . Depression with anxiety 01/16/2018  . Traumatic brain injury with loss of consciousness ([place]) 01/16/2018  . Hyperlipidemia    Narda Bonds, [place] 08/08/19 9:15 PM Phone: [phone] Fax: [facility] [address] [place], [place], [address] Phone: [phone]   Fax:  [phone]  Name: Lowana Monier MRN: [id number] Date of Birth: 12-29-1950

## 2019-08-09 ENCOUNTER — Ambulatory Visit: Payer: Medicare Other

## 2019-08-14 ENCOUNTER — Other Ambulatory Visit: Payer: Self-pay

## 2019-08-14 ENCOUNTER — Ambulatory Visit: Payer: Medicare Other

## 2019-08-14 DIAGNOSIS — R2689 Other abnormalities of gait and mobility: Secondary | ICD-10-CM | POA: Diagnosis not present

## 2019-08-14 DIAGNOSIS — M6281 Muscle weakness (generalized): Secondary | ICD-10-CM | POA: Diagnosis not present

## 2019-08-14 DIAGNOSIS — R2681 Unsteadiness on feet: Secondary | ICD-10-CM | POA: Diagnosis not present

## 2019-08-14 NOTE — Patient Instructions (Signed)
Access Code: [email or web address]  URL: [email or web address]  Date: 08/14/2019  Prepared by: Cherly Anderson   Exercises Clamshell - 10 reps - 3 sets - 2 hold - 1x daily - 3x weekly Standing Soleus Stretch - 3 reps - 2-3 sets - 30-60 hold - 1x daily - 7x weekly CLX Ankle Dorsiflexion and Eversion - 10 reps - 2 sets - 1x daily - 5x weekly Romberg Stance on Foam Pad with Head Rotation - 10 reps - 1 sets - 1x daily - 7x weekly Romberg Stance with Head Nods on Foam Pad - 10 reps - 1 sets - 1x daily - 7x weekly Romberg Stance Eyes Closed on Foam Pad - 3 reps - 1 sets - 20 hold - 1x daily - 7x weekly Sidestepping - 1 reps - 3 sets - 2x daily - 5x weekly Walking March - 1 reps - 3 sets - 2x daily - 7x weekly Tandem Stance - 3 reps - 2 sets - 30sec hold - 1x daily - 7x weekly                  Forward Step Up with Unilateral Counter Support - 10 reps - 2 sets - 1x daily - 3x weekly Lateral Step Up with Unilateral Counter Support - 10 reps - 2 sets - 1x daily - 3x weekly

## 2019-08-14 NOTE — Therapy (Signed)
[facility] [address] [place], [place], [address] Phone: [phone]   Fax:  [phone]  Physical Therapy Treatment  Patient Details  Name: Abigail Michael MRN: [id number] Date of Birth: Aug 19, 1951 Referring Provider (PT): Dr. Raoul Pitch   Encounter Date: 08/14/2019  PT End of Session - 08/14/19 1058    Visit Number  6    Number of Visits  17    Date for PT Re-Evaluation  10/22/19   written for 90 day POC   Authorization Type  Medicare + BCBS    PT Start Time  1017    PT Stop Time  1058    PT Time Calculation (min)  41 min    Activity Tolerance  Patient tolerated treatment well    Behavior During Therapy  [facility] for tasks assessed/performed       Past Medical History:  Diagnosis Date  . Acid reflux   . Brain injury ([place]) 2015   Due to a fall   . 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 Chicken pox   . Constipation   . Depression   . Frequent headaches   . GERD (gastroesophageal reflux disease)   . 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 Hyperlipidemia   . Hypertension   . MS (multiple sclerosis) ([place]) 12/15/2016   possible dx. many MRI w/ positive white matter abnormality. MRI reported stable since 2015. mild progressive  Cognitive loss  . Obstructive sleep apnea    on prior neuro notes 10/2016; no results.   . Osteoporosis    had been on reclast  . Post concussion syndrome 10/2013   ongoing issues with concentration, memory loss, lability; EEG completed and normal.   . TBI (traumatic brain injury) ([place]) 2014   fall at [facility], hit head off of floor. No bleed, "concussion". Seen neurology since for focus and word finding.   Marland Kitchen TIA (transient ischemic attack) 11/30/2016   MRI normal. No lateralization. Speech affected and weakness during a hypotensive episdoe.   . Urinary incontinence     Past Surgical History:  Procedure Laterality Date  . BACK SURGERY  02/2015    herniated disc L4/5/6  . BREAST BIOPSY  1988   biopsy  . [place]ONOSCOPY     [place] Around 2015  . ESOPHAGOGASTRODUODENOSCOPY  2016   [place]  . LAPAROSCOPIC HYSTERECTOMY     partial   . SHOULDER ARTHROSCOPY W/ LABRAL REPAIR Left 02/13/2016   left shoulder  . Study: cognitive eval  06/23/2016   mild cog-linguistic impairment, difficulty with verbal fluency  . Study: EEG   04/12/2015   Studies: EEG completed and normal.   . Study: MRI  04/28/2016   Impression: numerous non-enhancing flair hyperintense lesions w/in the brain .Suspicious for demyelinating disease such as lyme or MS. Her Lyme test were negative.     There were no vitals filed for this visit.  Subjective Assessment - 08/14/19 1021    Subjective  Pt reports that she really enjoyed the pool the other day. Was tired after.    Pertinent History  depression, headaches, HTN, MS (possible dx), OSA, post-consussion syndrome, TBI, TIA, osteopenia    Patient Stated Goals  wants to have better balance, feels more sure with herself - doesn't want to be scared of falling    Currently in Pain?  Yes    Pain Score  3     Pain Location  Ankle  Pain Orientation  Right;Left    Pain Descriptors / Indicators  Aching    Pain Type  Chronic pain    Pain Onset  More than a month ago    Multiple Pain Sites  Yes    Pain Score  4    Pain Location  Back    Pain Orientation  Lower    Pain Descriptors / Indicators  Aching    Pain Type  Chronic pain    Pain Onset  More than a month ago                       [facility] Adult PT Treatment/Exercise - 08/14/19 1024      Neuro Re-ed    Neuro Re-ed Details   Pt performed balance activities in corner on pillows: feet together eyes open x 30 sec then with head turns up/down and left/right x 10 each. Increased sway with eyes closed. On pillow with feet apart eyes closed 30 sec x 2. Improved stability with 2nd bout. Tandem gait along counter with only occasional UE support 6' x  4, sidestepping on compliant 4" foam for ankle strategy 4' x 4, standing on compliant 4" beam x 30 sec eyes open and then x20 sec eyes closed then losing balance anterior. Attempted staggered stance on blue foam beam without UE support but too unsteady so switched to floor x 30 sec each position. Easier with right foot behind. Close SBA with all activities for safety.      Exercises   Exercises  Other Exercises    Other Exercises   Step-ups on 4" step x 10 each LE without UE support getting balance each time forward and lateral with CGA for safety.             PT Education - 08/14/19 1124    Education Details  Added foward and lateral step-ups to HEP    Person(s) Educated  Patient    Methods  Explanation;Demonstration;Handout    Comprehension  Verbalized understanding;Returned demonstration       PT Short Term Goals - 07/25/19 1300      PT SHORT TERM GOAL #1   Title  Pt will be IND with HEP in order to build upon functional gains made in therapy. ALL STGS DUE 08/24/19    Time  4    Period  Weeks    Status  New    Target Date  08/24/19      PT SHORT TERM GOAL #2   Title  Patient will improve FGA score to at least 19/30 in order to demonstrate decreased fall risk.    Baseline  16/30 on 07/25/19    Time  4    Period  Weeks    Status  New      PT SHORT TERM GOAL #3   Title  Pt will perform floor<>stand txfs with supervision and with UE support to improve safety.    Baseline  not yet assessed.    Time  4    Period  Weeks    Status  New      PT SHORT TERM GOAL #4   Title  Patient will begin aquatic therapy PT in order to improve strength, balance, and gait.    Time  4    Period  Weeks    Status  New      PT SHORT TERM GOAL #5   Title  Patient will ambulate at least 230' over indoor/level surfaces  while scanning environment with supervision with no LOB.    Time  4    Period  Weeks    Status  New        PT Long Term Goals - 07/25/19 1303      PT LONG TERM GOAL #1    Title  Pt will be independent with final HEP (including aquatics) in order to indicate improved balance and strength. ALL LTGS DUE 09/23/19    Time  8    Period  Weeks    Status  New    Target Date  09/23/19      PT LONG TERM GOAL #2   Title  Patient will perform diaphragmatic breathing techniques in supine/sitting with no verbal cues in order to calm down nervous system and report decreased feelings of being overwhelmed.    Time  8    Period  Weeks    Status  New      PT LONG TERM GOAL #3   Title  Patient will improve FGA to at least 23/30 in order to demo decreased fall risk.    Baseline  16/30 on 07/25/19    Time  8    Period  Weeks    Status  New      PT LONG TERM GOAL #4   Title  Patient will ambulate at least 500' outdoors on paved surfaces while scanning environment with no LOB and suprevision in order to demonstrate improved community mobility.    Time  8    Period  Weeks    Status  New      PT LONG TERM GOAL #5   Title  Patient will perform 12 steps with single handrail on R and step through vs. step to pattern with supervision in order to demonstrate improved safety while going up and down her flight of stairs at home.    Time  8    Period  Weeks    Status  New            Plan - 08/14/19 1125    Clinical Impression Statement  Pt remains highly motivated during session and pushes herself. She is challenged with using ankle strategies with decreased stability on compliant surfaces. Will benefit from continued PT to continue to work on improving balance and strength.    Personal Factors and Comorbidities  Time since onset of injury/illness/exacerbation;Past/Current Experience;Comorbidity 3+    Comorbidities  depression, headaches, HTN, MS (possible dx), OSA, post-consussion syndrome, TBI, TIA, osteopenia    Examination-Activity Limitations  Stairs;Locomotion Level    Examination-Participation Restrictions  Community Activity    Stability/Clinical Decision Making   Stable/Uncomplicated    Rehab Potential  Good    PT Frequency  2x / week    PT Duration  8 weeks    PT Treatment/Interventions  ADLs/Self Care Home Management;Gait training;Stair training;Functional mobility training;Therapeutic activities;Therapeutic exercise;Balance training;Patient/family education;Neuromuscular re-education;Manual techniques;Aquatic Therapy    PT Next Visit Plan  Continue with ankle and hip strategy training with rocker board and foam, dynamic balance. SLS, balance focused on vestibular input. Reassess STG next land visit    PT Home Exercise Plan  [id number]    Consulted and Agree with Plan of Care  Patient       Patient will benefit from skilled therapeutic intervention in order to improve the following deficits and impairments:  Decreased balance, Decreased activity tolerance, Decreased mobility, Decreased strength, Difficulty walking, Abnormal gait  Visit Diagnosis: Other abnormalities of gait and mobility  Muscle  weakness (generalized)     Problem List Patient Active Problem List   Diagnosis Date Noted  . CKD (chronic kidney disease) stage 3, GFR 30-59 ml/min 05/08/2019  . GERD (gastroesophageal reflux disease) 05/07/2019  . Overactive bladder 11/26/2018  . Raynaud disease 10/06/2018  . Encounter for monitoring long-term proton pump inhibitor therapy 08/28/2018  . White matter disease 02/20/2018  . Osteoporosis   . Hypercalcemia 01/17/2018  . Essential hypertension 01/16/2018  . Hypothyroidism 01/16/2018  . Depression with anxiety 01/16/2018  . Traumatic brain injury with loss of consciousness ([place]) 01/16/2018  . Hyperlipidemia     Electa Sniff, PT, DPT, NCS 08/14/2019, 11:30 AM  [facility] [address] [place], [place], [address] Phone: [phone]   Fax:  [phone]  Name: Abigail Michael MRN: [id number] Date of Birth: [birth year]

## 2019-08-15 ENCOUNTER — Ambulatory Visit: Payer: Medicare Other | Admitting: Physical Therapy

## 2019-08-15 ENCOUNTER — Encounter: Payer: Self-pay | Admitting: Physical Therapy

## 2019-08-15 DIAGNOSIS — R2681 Unsteadiness on feet: Secondary | ICD-10-CM

## 2019-08-15 DIAGNOSIS — M6281 Muscle weakness (generalized): Secondary | ICD-10-CM | POA: Diagnosis not present

## 2019-08-15 DIAGNOSIS — R2689 Other abnormalities of gait and mobility: Secondary | ICD-10-CM | POA: Diagnosis not present

## 2019-08-15 NOTE — Therapy (Signed)
[facility] [address] [place], [place], [address] Phone: [phone]   Fax:  [phone]  Physical Therapy Treatment  Patient Details  Name: Abigail Michael MRN: [id number] Date of Birth: 04-25-1951 Referring Provider (PT): Dr. Raoul Pitch   Encounter Date: 08/15/2019  PT End of Session - 08/15/19 1826    Visit Number  7    Number of Visits  17    Date for PT Re-Evaluation  10/22/19   written for 90 day POC   Authorization Type  Medicare + BCBS    PT Start Time  1503    PT Stop Time  1546    PT Time Calculation (min)  43 min    Equipment Utilized During Treatment  Other (comment)   ankle cuffs   Activity Tolerance  Patient tolerated treatment well    Behavior During Therapy  [facility] for tasks assessed/performed       Past Medical History:  Diagnosis Date  . Acid reflux   . Brain injury ([place]) 2015   Due to a fall   . 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 Chicken pox   . Constipation   . Depression   . Frequent headaches   . GERD (gastroesophageal reflux disease)   . 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 Hyperlipidemia   . Hypertension   . MS (multiple sclerosis) ([place]) 12/15/2016   possible dx. many MRI w/ positive white matter abnormality. MRI reported stable since 2015. mild progressive  Cognitive loss  . Obstructive sleep apnea    on prior neuro notes 10/2016; no results.   . Osteoporosis    had been on reclast  . Post concussion syndrome 10/2013   ongoing issues with concentration, memory loss, lability; EEG completed and normal.   . TBI (traumatic brain injury) ([place]) 2014   fall at [facility], hit head off of floor. No bleed, "concussion". Seen neurology since for focus and word finding.   Marland Kitchen TIA (transient ischemic attack) 11/30/2016   MRI normal. No lateralization. Speech affected and weakness during a hypotensive episdoe.   . Urinary incontinence     Past  Surgical History:  Procedure Laterality Date  . BACK SURGERY  02/2015   herniated disc L4/5/6  . BREAST BIOPSY  1988   biopsy  . COLONOSCOPY     [place]. Around 2015  . ESOPHAGOGASTRODUODENOSCOPY  2016   [place]  . LAPAROSCOPIC HYSTERECTOMY     partial   . SHOULDER ARTHROSCOPY W/ LABRAL REPAIR Left 02/13/2016   left shoulder  . Study: cognitive eval  06/23/2016   mild cog-linguistic impairment, difficulty with verbal fluency  . Study: EEG   04/12/2015   Studies: EEG completed and normal.   . Study: MRI  04/28/2016   Impression: numerous non-enhancing flair hyperintense lesions w/in the brain .Suspicious for demyelinating disease such as lyme or MS. Her Lyme test were negative.     There were no vitals filed for this visit.  Subjective Assessment - 08/15/19 1825    Subjective  Pt reports feeling fatigued after last session and went home to take a nap afterwards.  States it was more challenging than she thought at the time.    Pertinent History  depression, headaches, HTN, MS (possible dx), OSA, post-consussion syndrome, TBI, TIA, osteopenia    Patient Stated Goals  wants to have better balance, feels more sure with herself - doesn't want to be scared of falling  Currently in Pain?  No/denies    Pain Onset  More than a month ago    Pain Onset  More than a month ago       Aquatic therapy - pool temp 87.2 degrees    Patient seen for aquatic therapy today.  Treatment took place in water 2.5-4 feet deep depending upon activity.  Pt entered the pool via step negotiation modified independently with use of hand rails.  Performed runners stretch at edge of pool holding side x 30 sec each side and bil toes up wall x 30 sec for stretch  Pt performed walking forward in water x 60m, backwards x 28m, side stepping with arms at side x 79m, side stepping with squat x 25 m x 2 reps with slowed down movement and verbal cues to maintain proper sequence and slow down for balance.    Pt performed bil. Hip strengthening exercises in standing with UE support on pool wall - hip flexion, extension, abduction, hamstring curl and hip flexion<>hip extension with use of ankle buoyant cuff weight for increased resistance with eccentric motion - 15 reps each    Pt performed heel raises x 20 reps; marching in place 20 reps   Pt performed standing with feet apart with head turns  - EO and then with EC; feet together with EO with horizontal and vertical head turns, then with EC; use of buoyancy of water for support with these balance exercises to be performed safely without risk of falling   Performed SLS x 10 sec x 2 reps each side, tandem standing each direction x 10 sec x 2 reps.  Tandem walking 15' x 2 reps.    Ai Chi postures for core stabilization, strengthening and balance training - Enclosing, Soothing and Balancing 10 reps each with mild LOB.  Verbal and visual cues to maintain sequencing.  Used edge of pool for balance.  On seat in pool performed 10 reps sit<>stand with ankle cuffs for buoyancy.  Performed 10 reps single leg sit<>stand with min UE assist.  Performed reverse chair push ups on step while keeping bil heels on pool floor by engaging core for increased stability.   Repeated stretches at end of session as performed at beginning.   Pt requires buoyancy of water for support with balance exs. and viscosity of water for resistance for strengthening exs.  Water provides safe environment to perform balance exercises safely without risk of fall without UE support       PT Short Term Goals - 07/25/19 1300      PT SHORT TERM GOAL #1   Title  Pt will be IND with HEP in order to build upon functional gains made in therapy. ALL STGS DUE 08/24/19    Time  4    Period  Weeks    Status  New    Target Date  08/24/19      PT SHORT TERM GOAL #2   Title  Patient will improve FGA score to at least 19/30 in order to demonstrate decreased fall risk.    Baseline  16/30 on  07/25/19    Time  4    Period  Weeks    Status  New      PT SHORT TERM GOAL #3   Title  Pt will perform floor<>stand txfs with supervision and with UE support to improve safety.    Baseline  not yet assessed.    Time  4    Period  Weeks  Status  New      PT SHORT TERM GOAL #4   Title  Patient will begin aquatic therapy PT in order to improve strength, balance, and gait.    Time  4    Period  Weeks    Status  New      PT SHORT TERM GOAL #5   Title  Patient will ambulate at least 230' over indoor/level surfaces while scanning environment with supervision with no LOB.    Time  4    Period  Weeks    Status  New        PT Long Term Goals - 07/25/19 1303      PT LONG TERM GOAL #1   Title  Pt will be independent with final HEP (including aquatics) in order to indicate improved balance and strength. ALL LTGS DUE 09/23/19    Time  8    Period  Weeks    Status  New    Target Date  09/23/19      PT LONG TERM GOAL #2   Title  Patient will perform diaphragmatic breathing techniques in supine/sitting with no verbal cues in order to calm down nervous system and report decreased feelings of being overwhelmed.    Time  8    Period  Weeks    Status  New      PT LONG TERM GOAL #3   Title  Patient will improve FGA to at least 23/30 in order to demo decreased fall risk.    Baseline  16/30 on 07/25/19    Time  8    Period  Weeks    Status  New      PT LONG TERM GOAL #4   Title  Patient will ambulate at least 500' outdoors on paved surfaces while scanning environment with no LOB and suprevision in order to demonstrate improved community mobility.    Time  8    Period  Weeks    Status  New      PT LONG TERM GOAL #5   Title  Patient will perform 12 steps with single handrail on R and step through vs. step to pattern with supervision in order to demonstrate improved safety while going up and down her flight of stairs at home.    Time  8    Period  Weeks    Status  New             Plan - 08/15/19 1827    Clinical Impression Statement  Pt continues to  tolerated aquatic sessions well with pt demonstrating decreased trunk control with balance and trunk stabilization required for Ai Chi postures.  Pt required intermittent UE support at times but less bil UE's movements to assist with balance than last time  Pt needed verbal and visual cues with exercises requiring coordination.    Personal Factors and Comorbidities  Time since onset of injury/illness/exacerbation;Past/Current Experience;Comorbidity 3+    Comorbidities  depression, headaches, HTN, MS (possible dx), OSA, post-consussion syndrome, TBI, TIA, osteopenia    Examination-Activity Limitations  Stairs;Locomotion Level    Examination-Participation Restrictions  Community Activity    Stability/Clinical Decision Making  Stable/Uncomplicated    Rehab Potential  Good    PT Frequency  2x / week    PT Duration  8 weeks    PT Treatment/Interventions  ADLs/Self Care Home Management;Gait training;Stair training;Functional mobility training;Therapeutic activities;Therapeutic exercise;Balance training;Patient/family education;Neuromuscular re-education;Manual techniques;Aquatic Therapy    PT Next Visit Plan  Continue with ankle  and hip strategy training with rocker board and foam, dynamic balance. SLS, balance focused on vestibular input. Reassess STG next land visit    PT Home Exercise Plan  [id number]    Consulted and Agree with Plan of Care  Patient       Patient will benefit from skilled therapeutic intervention in order to improve the following deficits and impairments:  Decreased balance, Decreased activity tolerance, Decreased mobility, Decreased strength, Difficulty walking, Abnormal gait  Visit Diagnosis: Other abnormalities of gait and mobility  Muscle weakness (generalized)  Unsteadiness on feet     Problem List Patient Active Problem List   Diagnosis Date Noted  . CKD (chronic kidney disease)  stage 3, GFR 30-59 ml/min 05/08/2019  . GERD (gastroesophageal reflux disease) 05/07/2019  . Overactive bladder 11/26/2018  . Raynaud disease 10/06/2018  . Encounter for monitoring long-term proton pump inhibitor therapy 08/28/2018  . White matter disease 02/20/2018  . Osteoporosis   . Hypercalcemia 01/17/2018  . Essential hypertension 01/16/2018  . Hypothyroidism 01/16/2018  . Depression with anxiety 01/16/2018  . Traumatic brain injury with loss of consciousness ([place]) 01/16/2018  . Hyperlipidemia     Narda Bonds, [place] Abigail 08/15/19 6:31 PM Phone: [phone] Fax: [facility] [address] [place], [place], [address] Phone: [phone]   Fax:  [phone]  Name: Jamerica Schlotzhauer MRN: [id number] Date of Birth: 11/05/1950

## 2019-08-16 ENCOUNTER — Ambulatory Visit: Payer: Medicare Other

## 2019-08-21 ENCOUNTER — Ambulatory Visit: Payer: Medicare Other | Attending: Family Medicine

## 2019-08-21 ENCOUNTER — Other Ambulatory Visit: Payer: Self-pay

## 2019-08-21 DIAGNOSIS — R2689 Other abnormalities of gait and mobility: Secondary | ICD-10-CM | POA: Diagnosis not present

## 2019-08-21 DIAGNOSIS — R2681 Unsteadiness on feet: Secondary | ICD-10-CM | POA: Insufficient documentation

## 2019-08-21 DIAGNOSIS — M6281 Muscle weakness (generalized): Secondary | ICD-10-CM | POA: Insufficient documentation

## 2019-08-21 NOTE — Therapy (Signed)
[facility] [address] [place], [place], [address] Phone: [phone]   Fax:  [phone]  Physical Therapy Treatment  Patient Details  Name: Abigail Michael MRN: [id number] Date of Birth: 04/11/1951 Referring Provider (PT): Dr. Raoul Pitch   Encounter Date: 08/21/2019  PT End of Session - 08/21/19 1106    Visit Number  8    Number of Visits  17    Date for PT Re-Evaluation  10/22/19   written for 90 day POC   Authorization Type  Medicare + BCBS    PT Start Time  1105    PT Stop Time  1145    PT Time Calculation (min)  40 min    Equipment Utilized During Treatment  Other (comment)   ankle cuffs   Activity Tolerance  Patient tolerated treatment well    Behavior During Therapy  [facility] for tasks assessed/performed       Past Medical History:  Diagnosis Date  . Acid reflux   . Brain injury ([place]) 2015   Due to a fall   . 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 Chicken pox   . Constipation   . Depression   . Frequent headaches   . GERD (gastroesophageal reflux disease)   . 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 Hyperlipidemia   . Hypertension   . MS (multiple sclerosis) ([place]) 12/15/2016   possible dx. many MRI w/ positive white matter abnormality. MRI reported stable since 2015. mild progressive  Cognitive loss  . Obstructive sleep apnea    on prior neuro notes 10/2016; no results.   . Osteoporosis    had been on reclast  . Post concussion syndrome 10/2013   ongoing issues with concentration, memory loss, lability; EEG completed and normal.   . TBI (traumatic brain injury) ([place]) 2014   fall at [facility], hit head off of floor. No bleed, "concussion". Seen neurology since for focus and word finding.   Marland Kitchen TIA (transient ischemic attack) 11/30/2016   MRI normal. No lateralization. Speech affected and weakness during a hypotensive episdoe.   . Urinary incontinence     Past Surgical  History:  Procedure Laterality Date  . BACK SURGERY  02/2015   herniated disc L4/5/6  . BREAST BIOPSY  1988   biopsy  . COLONOSCOPY     [place]. Around 2015  . ESOPHAGOGASTRODUODENOSCOPY  2016   [place]  . LAPAROSCOPIC HYSTERECTOMY     partial   . SHOULDER ARTHROSCOPY W/ LABRAL REPAIR Left 02/13/2016   left shoulder  . Study: cognitive eval  06/23/2016   mild cog-linguistic impairment, difficulty with verbal fluency  . Study: EEG   04/12/2015   Studies: EEG completed and normal.   . Study: MRI  04/28/2016   Impression: numerous non-enhancing flair hyperintense lesions w/in the brain .Suspicious for demyelinating disease such as lyme or MS. Her Lyme test were negative.     There were no vitals filed for this visit.  Subjective Assessment - 08/21/19 1105    Subjective  Pt reports she is doing well.    Pertinent History  depression, headaches, HTN, MS (possible dx), OSA, post-consussion syndrome, TBI, TIA, osteopenia    Patient Stated Goals  wants to have better balance, feels more sure with herself - doesn't want to be scared of falling    Currently in Pain?  Yes    Pain Score  4     Pain Location  Back    Pain Orientation  Lower    Pain Descriptors / Indicators  Throbbing    Pain Onset  More than a month ago    Pain Onset  More than a month ago         [facility] PT Assessment - 08/21/19 1108      Functional Gait  Assessment   Gait assessed   Yes    Gait Level Surface  Walks 20 ft in less than 5.5 sec, no assistive devices, good speed, no evidence for imbalance, normal gait pattern, deviates no more than 6 in outside of the 12 in walkway width.    Change in Gait Speed  Able to smoothly change walking speed without loss of balance or gait deviation. Deviate no more than 6 in outside of the 12 in walkway width.    Gait with Horizontal Head Turns  Performs head turns smoothly with slight change in gait velocity (eg, minor disruption to smooth gait path), deviates 6-10  in outside 12 in walkway width, or uses an assistive device.    Gait with Vertical Head Turns  Performs head turns with no change in gait. Deviates no more than 6 in outside 12 in walkway width.    Gait and Pivot Turn  Pivot turns safely within 3 sec and stops quickly with no loss of balance.    Step Over Obstacle  Is able to step over 2 stacked shoe boxes taped together (9 in total height) without changing gait speed. No evidence of imbalance.    Gait with Narrow Base of Support  Ambulates 7-9 steps.    Gait with Eyes Closed  Cannot walk 20 ft without assistance, severe gait deviations or imbalance, deviates greater than 15 in outside 12 in walkway width or will not attempt task.    Ambulating Backwards  Walks 20 ft, slow speed, abnormal gait pattern, evidence for imbalance, deviates 10-15 in outside 12 in walkway width.    Steps  Alternating feet, must use rail.    Total Score  22                   [facility] Adult PT Treatment/Exercise - 08/21/19 1123      Transfers   Comments  Pt performed floor to standing transfer with holding to mat and then with no support independently.      Ambulation/Gait   Ambulation/Gait  Yes    Ambulation/Gait Assistance  7: Independent    Ambulation Distance (Feet)  400 Feet    Assistive device  None    Gait Pattern  Step-through pattern    Stairs  Yes    Stairs Assistance  5: Supervision    Stairs Assistance Details (indicate cue type and reason)  Pt very cautious with descent with increased time to perform    Stair Management Technique  No rails    Number of Stairs  4    Height of Stairs  6    Gait Comments  Pt was given verbal cues to scan environment as she went with looking for difference objects. Pt reported increase in low back pain as went on. Did stagger at times but able to self correct.      Neuro Re-ed    Neuro Re-ed Details   Rockerboard ant/post trying to maintain level 30 sec x 2 then with adding in trunk rotation reaching across  body x 5 each side. Rockerboard lateral maintaining level 30 sec x 2. Close SBA for safety. Pt  was given verbal cues to breath which helped some with stability.      Exercises   Exercises  Other Exercises    Other Exercises   Step ups on 6" step x 10 left with 1 UE support.             PT Education - 08/21/19 1333    Education Details  Pt to continue with current HEP    Person(s) Educated  Patient    Methods  Explanation    Comprehension  Verbalized understanding       PT Short Term Goals - 08/21/19 1107      PT SHORT TERM GOAL #1   Title  Pt will be IND with HEP in order to build upon functional gains made in therapy. ALL STGS DUE 08/24/19    Baseline  Pt reports that she is doing exercises almost every day.    Time  4    Period  Weeks    Status  Achieved    Target Date  08/24/19      PT SHORT TERM GOAL #2   Title  Patient will improve FGA score to at least 19/30 in order to demonstrate decreased fall risk.    Baseline  22/30 on 08/21/19    Time  4    Period  Weeks    Status  Achieved      PT SHORT TERM GOAL #3   Title  Pt will perform floor<>stand txfs with supervision and with UE support to improve safety.    Baseline  Pt performed floor to stand transfer holding to chair and with nothing independently    Time  4    Period  Weeks    Status  Achieved      PT SHORT TERM GOAL #4   Title  Patient will begin aquatic therapy PT in order to improve strength, balance, and gait.    Baseline  Pt has started aquatic PT    Time  4    Period  Weeks    Status  Achieved      PT SHORT TERM GOAL #5   Title  Patient will ambulate at least 230' over indoor/level surfaces while scanning environment with supervision with no LOB.    Baseline  Pt ambulated inside >400' without AD scanning independently    Time  4    Period  Weeks    Status  Achieved        PT Long Term Goals - 07/25/19 1303      PT LONG TERM GOAL #1   Title  Pt will be independent with final HEP (including  aquatics) in order to indicate improved balance and strength. ALL LTGS DUE 09/23/19    Time  8    Period  Weeks    Status  New    Target Date  09/23/19      PT LONG TERM GOAL #2   Title  Patient will perform diaphragmatic breathing techniques in supine/sitting with no verbal cues in order to calm down nervous system and report decreased feelings of being overwhelmed.    Time  8    Period  Weeks    Status  New      PT LONG TERM GOAL #3   Title  Patient will improve FGA to at least 23/30 in order to demo decreased fall risk.    Baseline  16/30 on 07/25/19    Time  8    Period  Weeks  Status  New      PT LONG TERM GOAL #4   Title  Patient will ambulate at least 500' outdoors on paved surfaces while scanning environment with no LOB and suprevision in order to demonstrate improved community mobility.    Time  8    Period  Weeks    Status  New      PT LONG TERM GOAL #5   Title  Patient will perform 12 steps with single handrail on R and step through vs. step to pattern with supervision in order to demonstrate improved safety while going up and down her flight of stairs at home.    Time  8    Period  Weeks    Status  New            Plan - 08/21/19 1334    Clinical Impression Statement  Pt showing improving balance and gait meeting initial FGA goal. Pt also met STGs for gait and floor transfers. Pt continues to be challenged by narrow BOS activities and other balance activities requiring increased ankle strategy and will benefit from continued PT to continue to address.    Personal Factors and Comorbidities  Time since onset of injury/illness/exacerbation;Past/Current Experience;Comorbidity 3+    Comorbidities  depression, headaches, HTN, MS (possible dx), OSA, post-consussion syndrome, TBI, TIA, osteopenia    Examination-Activity Limitations  Stairs;Locomotion Level    Examination-Participation Restrictions  Community Activity    Stability/Clinical Decision Making   Stable/Uncomplicated    Rehab Potential  Good    PT Frequency  2x / week    PT Duration  8 weeks    PT Treatment/Interventions  ADLs/Self Care Home Management;Gait training;Stair training;Functional mobility training;Therapeutic activities;Therapeutic exercise;Balance training;Patient/family education;Neuromuscular re-education;Manual techniques;Aquatic Therapy    PT Next Visit Plan  Continue with ankle and hip strategy training with rocker board and foam, dynamic balance. SLS, balance focused on vestibular input. Next land visit 10th visit progress note    PT Home Exercise Plan  [id number]    Consulted and Agree with Plan of Care  Patient       Patient will benefit from skilled therapeutic intervention in order to improve the following deficits and impairments:  Decreased balance, Decreased activity tolerance, Decreased mobility, Decreased strength, Difficulty walking, Abnormal gait  Visit Diagnosis: Other abnormalities of gait and mobility  Muscle weakness (generalized)     Problem List Patient Active Problem List   Diagnosis Date Noted  . CKD (chronic kidney disease) stage 3, GFR 30-59 ml/min 05/08/2019  . GERD (gastroesophageal reflux disease) 05/07/2019  . Overactive bladder 11/26/2018  . Raynaud disease 10/06/2018  . Encounter for monitoring long-term proton pump inhibitor therapy 08/28/2018  . White matter disease 02/20/2018  . Osteoporosis   . Hypercalcemia 01/17/2018  . Essential hypertension 01/16/2018  . Hypothyroidism 01/16/2018  . Depression with anxiety 01/16/2018  . Traumatic brain injury with loss of consciousness ([place]) 01/16/2018  . Hyperlipidemia     Electa Sniff, PT, DPT, NCS 08/21/2019, 1:46 PM  [facility] [address] [place], [place], [address] Phone: [phone]   Fax:  [phone]  Name: Brandii Lakey MRN: [id number] Date of Birth: [birth year]

## 2019-08-22 ENCOUNTER — Ambulatory Visit: Payer: Medicare Other | Admitting: Physical Therapy

## 2019-08-22 ENCOUNTER — Encounter: Payer: Self-pay | Admitting: Physical Therapy

## 2019-08-22 DIAGNOSIS — R2689 Other abnormalities of gait and mobility: Secondary | ICD-10-CM | POA: Diagnosis not present

## 2019-08-22 DIAGNOSIS — R2681 Unsteadiness on feet: Secondary | ICD-10-CM

## 2019-08-22 DIAGNOSIS — M6281 Muscle weakness (generalized): Secondary | ICD-10-CM

## 2019-08-22 NOTE — Therapy (Signed)
[facility] [address] [place], [place], [address] Phone: [phone]   Fax:  [phone]  Physical Therapy Treatment  Patient Details  Name: Abigail Michael MRN: [id number] Date of Birth: December 16, 1950 Referring Provider (PT): Dr. Raoul Pitch   Encounter Date: 08/22/2019  PT End of Session - 08/22/19 1759    Visit Number  9    Number of Visits  17    Date for PT Re-Evaluation  10/22/19   written for 90 day POC   Authorization Type  Medicare + BCBS    PT Start Time  1506    PT Stop Time  1549    PT Time Calculation (min)  43 min    Equipment Utilized During Treatment  Other (comment)   buoyancy ankle cuffs and pool noodle   Activity Tolerance  Patient tolerated treatment well    Behavior During Therapy  [facility] for tasks assessed/performed       Past Medical History:  Diagnosis Date  . Acid reflux   . Brain injury ([place]) 2015   Due to a fall   . 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 Chicken pox   . Constipation   . Depression   . Frequent headaches   . GERD (gastroesophageal reflux disease)   . 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 Hyperlipidemia   . Hypertension   . MS (multiple sclerosis) ([place]) 12/15/2016   possible dx. many MRI w/ positive white matter abnormality. MRI reported stable since 2015. mild progressive  Cognitive loss  . Obstructive sleep apnea    on prior neuro notes 10/2016; no results.   . Osteoporosis    had been on reclast  . Post concussion syndrome 10/2013   ongoing issues with concentration, memory loss, lability; EEG completed and normal.   . TBI (traumatic brain injury) ([place]) 2014   fall at [facility], hit head off of floor. No bleed, "concussion". Seen neurology since for focus and word finding.   Marland Kitchen TIA (transient ischemic attack) 11/30/2016   MRI normal. No lateralization. Speech affected and weakness during a hypotensive episdoe.   . Urinary  incontinence     Past Surgical History:  Procedure Laterality Date  . BACK SURGERY  02/2015   herniated disc L4/5/6  . BREAST BIOPSY  1988   biopsy  . COLONOSCOPY     [place]. Around 2015  . ESOPHAGOGASTRODUODENOSCOPY  2016   [place]  . LAPAROSCOPIC HYSTERECTOMY     partial   . SHOULDER ARTHROSCOPY W/ LABRAL REPAIR Left 02/13/2016   left shoulder  . Study: cognitive eval  06/23/2016   mild cog-linguistic impairment, difficulty with verbal fluency  . Study: EEG   04/12/2015   Studies: EEG completed and normal.   . Study: MRI  04/28/2016   Impression: numerous non-enhancing flair hyperintense lesions w/in the brain .Suspicious for demyelinating disease such as lyme or MS. Her Lyme test were negative.     There were no vitals filed for this visit.  Subjective Assessment - 08/22/19 1758    Subjective  Denies falls or changes.  Feels challenged with balance with aquatics treatments.    Pertinent History  depression, headaches, HTN, MS (possible dx), OSA, post-consussion syndrome, TBI, TIA, osteopenia    Patient Stated Goals  wants to have better balance, feels more sure with herself - doesn't want to be scared of falling    Currently in Pain?  No/denies  Pain Onset  More than a month ago    Pain Onset  More than a month ago         Aquatic therapy - pool temp 87.4 degrees     Patient seen for aquatic therapy today.  Treatment took place in water 3.5-4 feet deep depending upon activity.  Pt entered the pool via step negotiation modified independently with use of hand rails.   Performed runners stretch at edge of pool holding side x 30 sec each side and bil toes up wall x 30 sec for stretch  Pt performed walking forward in water x 66m, backwards x 51m, side stepping with arms at side x 27m, side stepping with squat x 25 m x 2 reps with slowed down movement and verbal cues to maintain proper sequence and slow down for balance.   Pt performed bil. Hip strengthening  exercises in standing with UE support on pool wall - hip flexion, extension, abduction, hamstring curl and hip flexion<>hip extension with use of ankle buoyant cuff weight for increased resistance with eccentric motion - 15 reps each    Pt performed heel raises x 20 reps; marching in place 20 reps   Pt performed standing with feet apart with head turns  - EO and then with EC; feet together with EO with horizontal and vertical head turns, then with EC; use of buoyancy of water for support with these balance exercises to be performed safely without risk of falling   Performed SLS x 10 sec x 2 reps each side, tandem standing each direction x 10 sec x 2 reps.  Tandem walking 15' x 2 reps.  Squats at edge of pool x 10 then RLE x 10 reps then LLE x 10 reps   Ai Chi postures for core stabilization, strengthening and balance training - Enclosing, Soothing and Balancing 10 reps each with mild LOB.  Verbal and visual cues to maintain sequencing.  Used edge of pool for balance.  Pt supine with pool noodle behind back performing hip abd/add x 31m then bicycling LE's x 48m.  Pt needing min assist of PTA for supine exercises.  Was able to return to standing from supine without assistance.   Repeated stretches at end of session as performed at beginning.   Pt requires buoyancy of water for support with balance exs. and viscosity of water for resistance for strengthening exs.  Water provides safe environment to perform balance exercises safely without risk of fall without UE support       PT Short Term Goals - 08/21/19 1107      PT SHORT TERM GOAL #1   Title  Pt will be IND with HEP in order to build upon functional gains made in therapy. ALL STGS DUE 08/24/19    Baseline  Pt reports that she is doing exercises almost every day.    Time  4    Period  Weeks    Status  Achieved    Target Date  08/24/19      PT SHORT TERM GOAL #2   Title  Patient will improve FGA score to at least 19/30 in order to  demonstrate decreased fall risk.    Baseline  22/30 on 08/21/19    Time  4    Period  Weeks    Status  Achieved      PT SHORT TERM GOAL #3   Title  Pt will perform floor<>stand txfs with supervision and with UE support to improve safety.  Baseline  Pt performed floor to stand transfer holding to chair and with nothing independently    Time  4    Period  Weeks    Status  Achieved      PT SHORT TERM GOAL #4   Title  Patient will begin aquatic therapy PT in order to improve strength, balance, and gait.    Baseline  Pt has started aquatic PT    Time  4    Period  Weeks    Status  Achieved      PT SHORT TERM GOAL #5   Title  Patient will ambulate at least 230' over indoor/level surfaces while scanning environment with supervision with no LOB.    Baseline  Pt ambulated inside >400' without AD scanning independently    Time  4    Period  Weeks    Status  Achieved        PT Long Term Goals - 07/25/19 1303      PT LONG TERM GOAL #1   Title  Pt will be independent with final HEP (including aquatics) in order to indicate improved balance and strength. ALL LTGS DUE 09/23/19    Time  8    Period  Weeks    Status  New    Target Date  09/23/19      PT LONG TERM GOAL #2   Title  Patient will perform diaphragmatic breathing techniques in supine/sitting with no verbal cues in order to calm down nervous system and report decreased feelings of being overwhelmed.    Time  8    Period  Weeks    Status  New      PT LONG TERM GOAL #3   Title  Patient will improve FGA to at least 23/30 in order to demo decreased fall risk.    Baseline  16/30 on 07/25/19    Time  8    Period  Weeks    Status  New      PT LONG TERM GOAL #4   Title  Patient will ambulate at least 500' outdoors on paved surfaces while scanning environment with no LOB and suprevision in order to demonstrate improved community mobility.    Time  8    Period  Weeks    Status  New      PT LONG TERM GOAL #5   Title  Patient  will perform 12 steps with single handrail on R and step through vs. step to pattern with supervision in order to demonstrate improved safety while going up and down her flight of stairs at home.    Time  8    Period  Weeks    Status  New            Plan - 08/22/19 1801    Clinical Impression Statement  Pt continues to  demonstrate decreased trunk control with balance and trunk stabilization and requires intermittent UE support at times.  Able to perform supine activities with min assist for support.  Pt works hard during sessions.    Personal Factors and Comorbidities  Time since onset of injury/illness/exacerbation;Past/Current Experience;Comorbidity 3+    Comorbidities  depression, headaches, HTN, MS (possible dx), OSA, post-consussion syndrome, TBI, TIA, osteopenia    Examination-Activity Limitations  Stairs;Locomotion Level    Examination-Participation Restrictions  Community Activity    Stability/Clinical Decision Making  Stable/Uncomplicated    Rehab Potential  Good    PT Frequency  2x / week    PT Duration  8 weeks    PT Treatment/Interventions  ADLs/Self Care Home Management;Gait training;Stair training;Functional mobility training;Therapeutic activities;Therapeutic exercise;Balance training;Patient/family education;Neuromuscular re-education;Manual techniques;Aquatic Therapy    PT Next Visit Plan  Continue with ankle and hip strategy training with rocker board and foam, dynamic balance. SLS, balance focused on vestibular input. Next land visit 10th visit progress note    PT Home Exercise Plan  [id number]    Consulted and Agree with Plan of Care  Patient       Patient will benefit from skilled therapeutic intervention in order to improve the following deficits and impairments:  Decreased balance, Decreased activity tolerance, Decreased mobility, Decreased strength, Difficulty walking, Abnormal gait  Visit Diagnosis: Other abnormalities of gait and mobility  Muscle weakness  (generalized)  Unsteadiness on feet     Problem List Patient Active Problem List   Diagnosis Date Noted  . CKD (chronic kidney disease) stage 3, GFR 30-59 ml/min 05/08/2019  . GERD (gastroesophageal reflux disease) 05/07/2019  . Overactive bladder 11/26/2018  . Raynaud disease 10/06/2018  . Encounter for monitoring long-term proton pump inhibitor therapy 08/28/2018  . White matter disease 02/20/2018  . Osteoporosis   . Hypercalcemia 01/17/2018  . Essential hypertension 01/16/2018  . Hypothyroidism 01/16/2018  . Depression with anxiety 01/16/2018  . Traumatic brain injury with loss of consciousness ([place]) 01/16/2018  . Hyperlipidemia     Narda Bonds, [place] 08/22/19 6:05 PM Phone: [phone] Fax: [facility] [address] [place], [place], [address] Phone: [phone]   Fax:  [phone]  Name: Quantisha Majeed MRN: [id number] Date of Birth: 1950/12/11

## 2019-08-23 ENCOUNTER — Ambulatory Visit: Payer: Medicare Other

## 2019-08-28 ENCOUNTER — Other Ambulatory Visit: Payer: Self-pay

## 2019-08-28 ENCOUNTER — Ambulatory Visit: Payer: Medicare Other

## 2019-08-28 DIAGNOSIS — R2689 Other abnormalities of gait and mobility: Secondary | ICD-10-CM

## 2019-08-28 DIAGNOSIS — M6281 Muscle weakness (generalized): Secondary | ICD-10-CM | POA: Diagnosis not present

## 2019-08-28 DIAGNOSIS — R2681 Unsteadiness on feet: Secondary | ICD-10-CM | POA: Diagnosis not present

## 2019-08-28 NOTE — Therapy (Signed)
[facility] [address] [place], [place], [address] Phone: [phone]   Fax:  [phone]  Physical Therapy Treatment/10th visit progress note  Patient Details  Name: Abigail Michael MRN: [id number] Date of Birth: Aug 01, 1951 Referring Provider (PT): Dr. Raoul Pitch    Progress Note  Reporting period 07/24/2019 to 08/28/2019  See Note below for Objective Data and Assessment of Progress/Goals  Encounter Date: 08/28/2019  PT End of Session - 08/28/19 0934    Visit Number  10    Number of Visits  17    Date for PT Re-Evaluation  10/22/19   written for 90 day POC   Authorization Type  Medicare + BCBS    PT Start Time  0932    PT Stop Time  1015    PT Time Calculation (min)  43 min    Equipment Utilized During Treatment  Other (comment)   buoyancy ankle cuffs and pool noodle   Activity Tolerance  Patient tolerated treatment well    Behavior During Therapy  [facility] for tasks assessed/performed       Past Medical History:  Diagnosis Date  . Acid reflux   . Brain injury ([place]) 2015   Due to a fall   . 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 Chicken pox   . Constipation   . Depression   . Frequent headaches   . GERD (gastroesophageal reflux disease)   . 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 Hyperlipidemia   . Hypertension   . MS (multiple sclerosis) ([place]) 12/15/2016   possible dx. many MRI w/ positive white matter abnormality. MRI reported stable since 2015. mild progressive  Cognitive loss  . Obstructive sleep apnea    on prior neuro notes 10/2016; no results.   . Osteoporosis    had been on reclast  . Post concussion syndrome 10/2013   ongoing issues with concentration, memory loss, lability; EEG completed and normal.   . TBI (traumatic brain injury) ([place]) 2014   fall at [facility], hit head off of floor. No bleed, "concussion". Seen neurology since for focus and word finding.   Marland Kitchen  TIA (transient ischemic attack) 11/30/2016   MRI normal. No lateralization. Speech affected and weakness during a hypotensive episdoe.   . Urinary incontinence     Past Surgical History:  Procedure Laterality Date  . BACK SURGERY  02/2015   herniated disc L4/5/6  . BREAST BIOPSY  1988   biopsy  . COLONOSCOPY     [place] Around 2015  . ESOPHAGOGASTRODUODENOSCOPY  2016   [place]  . LAPAROSCOPIC HYSTERECTOMY     partial   . SHOULDER ARTHROSCOPY W/ LABRAL REPAIR Left 02/13/2016   left shoulder  . Study: cognitive eval  06/23/2016   mild cog-linguistic impairment, difficulty with verbal fluency  . Study: EEG   04/12/2015   Studies: EEG completed and normal.   . Study: MRI  04/28/2016   Impression: numerous non-enhancing flair hyperintense lesions w/in the brain .Suspicious for demyelinating disease such as lyme or MS. Her Lyme test were negative.     There were no vitals filed for this visit.  Subjective Assessment - 08/28/19 0935    Subjective  Pt reports that low back has been bothering her more the last several days.    Pertinent History  depression, headaches, HTN, MS (possible dx), OSA, post-consussion syndrome, TBI, TIA, osteopenia    Patient Stated Goals  wants to have  better balance, feels more sure with herself - doesn't want to be scared of falling    Currently in Pain?  Yes    Pain Score  8     Pain Location  Back    Pain Orientation  Lower;Medial    Pain Descriptors / Indicators  Aching    Pain Type  Chronic pain    Pain Onset  More than a month ago    Multiple Pain Sites  Yes    Pain Location  Ankle    Pain Orientation  Right;Left    Pain Descriptors / Indicators  Sore    Pain Type  Chronic pain    Pain Onset  More than a month ago                       [facility] Adult PT Treatment/Exercise - 08/28/19 0954      Ambulation/Gait   Ambulation/Gait  Yes    Ambulation/Gait Assistance  7: Independent    Ambulation Distance (Feet)  815  Feet    Assistive device  None    Gait Pattern  Step-through pattern    Stairs Assistance  6: Modified independent (Device/Increase time);5: Supervision    Stair Management Technique  Alternating pattern;No rails    Number of Stairs  12    Gait Comments  Pt ambulated 5 min. Reported back pain down to 4/10 at end of session.      Neuro Re-ed    Neuro Re-ed Details   Side stepping on blue foam  in // bars  x 2 laps without UE support, tandem stance on blue foam 30 sec x 2  each position with occasional UE support. Pt reported ankles sore. Tandem gait in // bars x 2 laps. Step-ups on 6" step x 10 bilateral forward and lateral without UE support.  Pt required occasional UE support with activities. Ankles working hard throughout      Exercises   Exercises  Other Exercises    Other Exercises   Standing gastroc stretch 30 sec x 2 bilateral. Ankle rolls x 10 each direction. Pt reported decrease in ankle soreness and in back pain after activities.             PT Education - 08/28/19 1000    Education Details  Added standing gastroc stretch    Person(s) Educated  Patient    Methods  Explanation;Handout    Comprehension  Verbalized understanding;Returned demonstration       PT Short Term Goals - 08/21/19 1107      PT SHORT TERM GOAL #1   Title  Pt will be IND with HEP in order to build upon functional gains made in therapy. ALL STGS DUE 08/24/19    Baseline  Pt reports that she is doing exercises almost every day.    Time  4    Period  Weeks    Status  Achieved    Target Date  08/24/19      PT SHORT TERM GOAL #2   Title  Patient will improve FGA score to at least 19/30 in order to demonstrate decreased fall risk.    Baseline  22/30 on 08/21/19    Time  4    Period  Weeks    Status  Achieved      PT SHORT TERM GOAL #3   Title  Pt will perform floor<>stand txfs with supervision and with UE support to improve safety.    Baseline  Pt  performed floor to stand transfer holding to chair  and with nothing independently    Time  4    Period  Weeks    Status  Achieved      PT SHORT TERM GOAL #4   Title  Patient will begin aquatic therapy PT in order to improve strength, balance, and gait.    Baseline  Pt has started aquatic PT    Time  4    Period  Weeks    Status  Achieved      PT SHORT TERM GOAL #5   Title  Patient will ambulate at least 230' over indoor/level surfaces while scanning environment with supervision with no LOB.    Baseline  Pt ambulated inside >400' without AD scanning independently    Time  4    Period  Weeks    Status  Achieved        PT Long Term Goals - 08/28/19 6759      PT LONG TERM GOAL #1   Title  Pt will be independent with final HEP (including aquatics) in order to indicate improved balance and strength. ALL LTGS DUE 09/23/19    Time  8    Period  Weeks    Status  New      PT LONG TERM GOAL #2   Title  Patient will perform diaphragmatic breathing techniques in supine/sitting with no verbal cues in order to calm down nervous system and report decreased feelings of being overwhelmed.    Baseline  Pt reports that she has been using the diaphragmatic breathing when she feels anxious and it is helping.    Time  8    Period  Weeks    Status  Achieved      PT LONG TERM GOAL #3   Title  Patient will improve FGA to at least 23/30 in order to demo decreased fall risk.    Baseline  16/30 on 07/25/19, 22/30 on 08/21/19    Time  8    Period  Weeks    Status  On-going      PT LONG TERM GOAL #4   Title  Patient will ambulate at least 500' outdoors on paved surfaces while scanning environment with no LOB and suprevision in order to demonstrate improved community mobility.    Baseline  >800 on level floor inside independently with no LOB    Time  8    Period  Weeks    Status  Achieved      PT LONG TERM GOAL #5   Title  Patient will perform 12 steps with single handrail on R and step through vs. step to pattern with supervision in order to  demonstrate improved safety while going up and down her flight of stairs at home.    Baseline  Pt performed 12 steps without railing with reciprocal pattern supervision. Able to perform with railing mod I.    Time  8    Period  Weeks    Status  Achieved            Plan - 08/28/19 1556    Clinical Impression Statement  Pt continues to show improvement with balance and gait activities. Pt has been doing better with controlling her anxiety with deep breathing activities. Has met gait goals on level surfaces. She is able to negotiate steps without rails supervison and with rails independently. Pt challenged with balance activities especially challenging ankle strategy but continues to show improvement. She reported decreased pain  in low back and ankles after session today. Pt continues to benefit from skilled PT to work on strengthening and balance to improve functional mobility.    Personal Factors and Comorbidities  Time since onset of injury/illness/exacerbation;Past/Current Experience;Comorbidity 3+    Comorbidities  depression, headaches, HTN, MS (possible dx), OSA, post-consussion syndrome, TBI, TIA, osteopenia    Examination-Activity Limitations  Stairs;Locomotion Level    Examination-Participation Restrictions  Community Activity    Stability/Clinical Decision Making  Stable/Uncomplicated    Rehab Potential  Good    PT Frequency  2x / week    PT Duration  8 weeks    PT Treatment/Interventions  ADLs/Self Care Home Management;Gait training;Stair training;Functional mobility training;Therapeutic activities;Therapeutic exercise;Balance training;Patient/family education;Neuromuscular re-education;Manual techniques;Aquatic Therapy    PT Next Visit Plan  Continue with ankle and hip strategy training with rocker board and foam, dynamic balance. SLS, balance focused on vestibular input.    PT Home Exercise Plan  [email or web address]    Consulted and Agree with Plan of Care  Patient       Patient will  benefit from skilled therapeutic intervention in order to improve the following deficits and impairments:  Decreased balance, Decreased activity tolerance, Decreased mobility, Decreased strength, Difficulty walking, Abnormal gait  Visit Diagnosis: Other abnormalities of gait and mobility  Muscle weakness (generalized)     Problem List Patient Active Problem List   Diagnosis Date Noted  . CKD (chronic kidney disease) stage 3, GFR 30-59 ml/min 05/08/2019  . GERD (gastroesophageal reflux disease) 05/07/2019  . Overactive bladder 11/26/2018  . Raynaud disease 10/06/2018  . Encounter for monitoring long-term proton pump inhibitor therapy 08/28/2018  . White matter disease 02/20/2018  . Osteoporosis   . Hypercalcemia 01/17/2018  . Essential hypertension 01/16/2018  . Hypothyroidism 01/16/2018  . Depression with anxiety 01/16/2018  . Traumatic brain injury with loss of consciousness ([place]) 01/16/2018  . Hyperlipidemia     Electa Sniff, PT, DPT, NCS 08/28/2019, 4:01 PM  [facility] [address] [place], [place], [address] Phone: [phone]   Fax:  [phone]  Name: Giulianna Rocha MRN: [id number] Date of Birth: 1951/04/17

## 2019-08-28 NOTE — Patient Instructions (Signed)
Access Code: [email or web address]  URL: [email or web address]  Date: 08/28/2019  Prepared by: Cherly Anderson   Exercises Clamshell - 10 reps - 3 sets - 2 hold - 1x daily - 3x weekly Standing Soleus Stretch - 3 reps - 2-3 sets - 30-60 hold - 1x daily - 7x weekly CLX Ankle Dorsiflexion and Eversion - 10 reps - 2 sets - 1x daily - 5x weekly Romberg Stance on Foam Pad with Head Rotation - 10 reps - 1 sets - 1x daily - 7x weekly Romberg Stance with Head Nods on Foam Pad - 10 reps - 1 sets - 1x daily - 7x weekly Romberg Stance Eyes Closed on Foam Pad - 3 reps - 1 sets - 20 hold - 1x daily - 7x weekly Sidestepping - 1 reps - 3 sets - 2x daily - 5x weekly Walking March - 1 reps - 3 sets - 2x daily - 7x weekly Tandem Stance - 3 reps - 2 sets - 30sec hold - 1x daily - 7x weekly                  Forward Step Up with Unilateral Counter Support - 10 reps - 2 sets - 1x daily - 3x weekly Lateral Step Up with Unilateral Counter Support - 10 reps - 2 sets - 1x daily - 3x weekly Standing Gastroc Stretch - 3 reps - 1 sets - 30 hold - 1x daily - 7x weekly

## 2019-08-29 ENCOUNTER — Ambulatory Visit: Payer: Self-pay | Admitting: Physical Therapy

## 2019-08-30 ENCOUNTER — Ambulatory Visit: Payer: Medicare Other

## 2019-09-03 ENCOUNTER — Encounter: Payer: Self-pay | Admitting: Physical Therapy

## 2019-09-03 ENCOUNTER — Other Ambulatory Visit: Payer: Self-pay

## 2019-09-03 ENCOUNTER — Ambulatory Visit: Payer: Medicare Other | Admitting: Physical Therapy

## 2019-09-03 DIAGNOSIS — M6281 Muscle weakness (generalized): Secondary | ICD-10-CM | POA: Diagnosis not present

## 2019-09-03 DIAGNOSIS — R2689 Other abnormalities of gait and mobility: Secondary | ICD-10-CM | POA: Diagnosis not present

## 2019-09-03 DIAGNOSIS — R2681 Unsteadiness on feet: Secondary | ICD-10-CM | POA: Diagnosis not present

## 2019-09-03 NOTE — Therapy (Signed)
[facility] [address] [place], [place], [address] Phone: [phone]   Fax:  [phone]  Physical Therapy Treatment  Patient Details  Name: Abigail Michael MRN: [id number] Date of Birth: May 22, 1951 Referring Provider (PT): Dr. Raoul Pitch   Encounter Date: 09/03/2019  PT End of Session - 09/03/19 1754    Visit Number  11    Number of Visits  17    Date for PT Re-Evaluation  10/22/19   written for 90 day POC   Authorization Type  Medicare + BCBS    PT Start Time  1500    PT Stop Time  1545    PT Time Calculation (min)  45 min    Equipment Utilized During Treatment  Other (comment)   buoyancy ankle cuffs and pool noodle   Activity Tolerance  Patient tolerated treatment well    Behavior During Therapy  [facility] for tasks assessed/performed       Past Medical History:  Diagnosis Date  . Acid reflux   . Brain injury ([place]) 2015   Due to a fall   . 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 Chicken pox   . Constipation   . Depression   . Frequent headaches   . GERD (gastroesophageal reflux disease)   . 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 Hyperlipidemia   . Hypertension   . MS (multiple sclerosis) ([place]) 12/15/2016   possible dx. many MRI w/ positive white matter abnormality. MRI reported stable since 2015. mild progressive  Cognitive loss  . Obstructive sleep apnea    on prior neuro notes 10/2016; no results.   . Osteoporosis    had been on reclast  . Post concussion syndrome 10/2013   ongoing issues with concentration, memory loss, lability; EEG completed and normal.   . TBI (traumatic brain injury) ([place]) 2014   fall at [facility], hit head off of floor. No bleed, "concussion". Seen neurology since for focus and word finding.   Marland Kitchen TIA (transient ischemic attack) 11/30/2016   MRI normal. No lateralization. Speech affected and weakness during a hypotensive episdoe.   . Urinary  incontinence     Past Surgical History:  Procedure Laterality Date  . BACK SURGERY  02/2015   herniated disc L4/5/6  . BREAST BIOPSY  1988   biopsy  . COLONOSCOPY     [place]. Around 2015  . ESOPHAGOGASTRODUODENOSCOPY  2016   [place]  . LAPAROSCOPIC HYSTERECTOMY     partial   . SHOULDER ARTHROSCOPY W/ LABRAL REPAIR Left 02/13/2016   left shoulder  . Study: cognitive eval  06/23/2016   mild cog-linguistic impairment, difficulty with verbal fluency  . Study: EEG   04/12/2015   Studies: EEG completed and normal.   . Study: MRI  04/28/2016   Impression: numerous non-enhancing flair hyperintense lesions w/in the brain .Suspicious for demyelinating disease such as lyme or MS. Her Lyme test were negative.     There were no vitals filed for this visit.  Subjective Assessment - 09/03/19 1747    Subjective  Pt reports feeling like her ankles are still weak and unstable and impact her balance.    Pertinent History  depression, headaches, HTN, MS (possible dx), OSA, post-consussion syndrome, TBI, TIA, osteopenia    Patient Stated Goals  wants to have better balance, feels more sure with herself - doesn't want to be scared of falling    Currently in Pain?  No/denies  Pain Onset  More than a month ago    Pain Onset  More than a month ago       Aquatic therapy - pool temp 87.2 degrees     Patient seen for aquatic therapy today.  Treatment took place in water 3.5-4 feet deep depending upon activity.  Pt entered the pool via step negotiation modified independently with use of hand rails.   Pt performed walking forward in water x 57m x 2 with second rep with high marching, backwards x 51m, side stepping with squat x 25 m x 2 reps    Pt performed bilateral hip strengthening exercises in standing with intermittent UE support on pool wall - hip flexion, extension, abduction, hamstring curl and hip flexion<>hip extension with use of ankle buoyant cuff weight for increased  resistance with eccentric motion - 15 reps each    Pt performed heel raises x 20 reps; marching in place 20 reps. Ambulating x 35m on heels, x 4m on toes, x61m sidestepping on toes.  Repeated heels x 58m and on toes x 85m.  Standing toe taps x 20. Performed runners stretch at edge of pool holding side x 30 sec each side and bil toes up wall x 30 sec for stretch     Performed SLS x 10 sec x 2 reps each side, tandem standing each direction x 10 sec x 2 reps.  Tandem walking 15' x 2 reps.  Squats at edge of pool x 10 then RLE x 10 reps then LLE x 10 reps   Ai Chi postures for core stabilization, strengthening and balance training - Enclosing, Soothing and Balancing 10 reps each with mild LOB.  Verbal and visual cues to maintain sequencing.    Pt supine with pool noodle behind back performing hip abd/add x 51m then bicycling LE's x 51m.  Pt needing min assist of PTA for supine exercises.  Was able to return to standing from supine without assistance.  Stepping onto/off 6" pool step with 1 UE support x 15 reps Repeated stretches at end of session as performed at beginning.   Pt requires buoyancy of water for support with balance exs. and viscosity of water for resistance for strengthening exs.  Water provides safe environment to perform balance exercises safely without risk of fall without UE support  Pt continues to use UE movements in pool to assist with balance at times.     PT Short Term Goals - 08/21/19 1107      PT SHORT TERM GOAL #1   Title  Pt will be IND with HEP in order to build upon functional gains made in therapy. ALL STGS DUE 08/24/19    Baseline  Pt reports that she is doing exercises almost every day.    Time  4    Period  Weeks    Status  Achieved    Target Date  08/24/19      PT SHORT TERM GOAL #2   Title  Patient will improve FGA score to at least 19/30 in order to demonstrate decreased fall risk.    Baseline  22/30 on 08/21/19    Time  4    Period  Weeks    Status   Achieved      PT SHORT TERM GOAL #3   Title  Pt will perform floor<>stand txfs with supervision and with UE support to improve safety.    Baseline  Pt performed floor to stand transfer holding to chair and with nothing independently  Time  4    Period  Weeks    Status  Achieved      PT SHORT TERM GOAL #4   Title  Patient will begin aquatic therapy PT in order to improve strength, balance, and gait.    Baseline  Pt has started aquatic PT    Time  4    Period  Weeks    Status  Achieved      PT SHORT TERM GOAL #5   Title  Patient will ambulate at least 230' over indoor/level surfaces while scanning environment with supervision with no LOB.    Baseline  Pt ambulated inside >400' without AD scanning independently    Time  4    Period  Weeks    Status  Achieved        PT Long Term Goals - 08/28/19 [id number]      PT LONG TERM GOAL #1   Title  Pt will be independent with final HEP (including aquatics) in order to indicate improved balance and strength. ALL LTGS DUE 09/23/19    Time  8    Period  Weeks    Status  New      PT LONG TERM GOAL #2   Title  Patient will perform diaphragmatic breathing techniques in supine/sitting with no verbal cues in order to calm down nervous system and report decreased feelings of being overwhelmed.    Baseline  Pt reports that she has been using the diaphragmatic breathing when she feels anxious and it is helping.    Time  8    Period  Weeks    Status  Achieved      PT LONG TERM GOAL #3   Title  Patient will improve FGA to at least 23/30 in order to demo decreased fall risk.    Baseline  16/30 on 07/25/19, 22/30 on 08/21/19    Time  8    Period  Weeks    Status  On-going      PT LONG TERM GOAL #4   Title  Patient will ambulate at least 500' outdoors on paved surfaces while scanning environment with no LOB and suprevision in order to demonstrate improved community mobility.    Baseline  >800 on level floor inside independently with no LOB    Time  8     Period  Weeks    Status  Achieved      PT LONG TERM GOAL #5   Title  Patient will perform 12 steps with single handrail on R and step through vs. step to pattern with supervision in order to demonstrate improved safety while going up and down her flight of stairs at home.    Baseline  Pt performed 12 steps without railing with reciprocal pattern supervision. Able to perform with railing mod I.    Time  8    Period  Weeks    Status  Achieved            Plan - 09/03/19 1754    Clinical Impression Statement  Pt's overall balance improving in pool but continues to be challenged, especially with decrease BOS and on heels vs toes.  Pt works hard during session and does not require rest breaks.  Continues to use UE's significantly with challenging balance activities.    Personal Factors and Comorbidities  Time since onset of injury/illness/exacerbation;Past/Current Experience;Comorbidity 3+    Comorbidities  depression, headaches, HTN, MS (possible dx), OSA, post-consussion syndrome, TBI, TIA, osteopenia  Examination-Activity Limitations  Stairs;Locomotion Level    Examination-Participation Restrictions  Community Activity    Stability/Clinical Decision Making  Stable/Uncomplicated    Rehab Potential  Good    PT Frequency  2x / week    PT Duration  8 weeks    PT Treatment/Interventions  ADLs/Self Care Home Management;Gait training;Stair training;Functional mobility training;Therapeutic activities;Therapeutic exercise;Balance training;Patient/family education;Neuromuscular re-education;Manual techniques;Aquatic Therapy    PT Next Visit Plan  Continue with ankle and hip strategy training with rocker board and foam, dynamic balance. SLS, balance focused on vestibular input.    PT Home Exercise Plan  [email or web address]    Consulted and Agree with Plan of Care  Patient       Patient will benefit from skilled therapeutic intervention in order to improve the following deficits and impairments:   Decreased balance, Decreased activity tolerance, Decreased mobility, Decreased strength, Difficulty walking, Abnormal gait  Visit Diagnosis: Other abnormalities of gait and mobility  Muscle weakness (generalized)  Unsteadiness on feet     Problem List Patient Active Problem List   Diagnosis Date Noted  . CKD (chronic kidney disease) stage 3, GFR 30-59 ml/min 05/08/2019  . GERD (gastroesophageal reflux disease) 05/07/2019  . Overactive bladder 11/26/2018  . Raynaud disease 10/06/2018  . Encounter for monitoring long-term proton pump inhibitor therapy 08/28/2018  . White matter disease 02/20/2018  . Osteoporosis   . Hypercalcemia 01/17/2018  . Essential hypertension 01/16/2018  . Hypothyroidism 01/16/2018  . Depression with anxiety 01/16/2018  . Traumatic brain injury with loss of consciousness ([place]) 01/16/2018  . Hyperlipidemia    Narda Bonds, [place] 09/03/19 5:59 PM Phone: [phone] Fax: [facility] [address] [place], [place], [address] Phone: [phone]   Fax:  [phone]  Name: Abigail Michael MRN: [id number] Date of Birth: 1951/07/20

## 2019-09-04 ENCOUNTER — Ambulatory Visit: Payer: Medicare Other

## 2019-09-04 ENCOUNTER — Other Ambulatory Visit: Payer: Self-pay

## 2019-09-04 DIAGNOSIS — R2681 Unsteadiness on feet: Secondary | ICD-10-CM | POA: Diagnosis not present

## 2019-09-04 DIAGNOSIS — R2689 Other abnormalities of gait and mobility: Secondary | ICD-10-CM

## 2019-09-04 DIAGNOSIS — M6281 Muscle weakness (generalized): Secondary | ICD-10-CM

## 2019-09-04 NOTE — Therapy (Signed)
Abigail Michael [place], [place], [address] Phone: [phone]   Fax:  [phone]  Physical Therapy Treatment  Patient Details  Name: Abigail Michael MRN: [id number] Date of Birth: 1951/04/19 Referring Provider (PT): Dr. Raoul Pitch   Encounter Date: 09/04/2019  PT End of Session - 09/04/19 1021    Visit Number  12    Number of Visits  17    Date for PT Re-Evaluation  10/22/19   written for 90 day POC   Authorization Type  Medicare + BCBS    PT Start Time  1019    PT Stop Time  1057    PT Time Calculation (min)  38 min    Equipment Utilized During Treatment  Other (comment)   buoyancy ankle cuffs and pool noodle   Activity Tolerance  Patient tolerated treatment well    Behavior During Therapy  [facility] for tasks assessed/performed       Past Medical History:  Diagnosis Date  . Acid reflux   . Brain injury ([place]) 2015   Due to a fall   . 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 Chicken pox   . Constipation   . Depression   . Frequent headaches   . GERD (gastroesophageal reflux disease)   . 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 Hyperlipidemia   . Hypertension   . MS (multiple sclerosis) ([place]) 12/15/2016   possible dx. many MRI w/ positive white matter abnormality. MRI reported stable since 2015. mild progressive  Cognitive loss  . Obstructive sleep apnea    on prior neuro notes 10/2016; no results.   . Osteoporosis    had been on reclast  . Post concussion syndrome 10/2013   ongoing issues with concentration, memory loss, lability; EEG completed and normal.   . TBI (traumatic brain injury) ([place]) 2014   fall at [facility], hit head off of floor. No bleed, "concussion". Seen neurology since for focus and word finding.   Marland Kitchen TIA (transient ischemic attack) 11/30/2016   MRI normal. No lateralization. Speech affected and weakness during a hypotensive episdoe.   . Urinary  incontinence     Past Surgical History:  Procedure Laterality Date  . BACK SURGERY  02/2015   herniated disc L4/5/6  . BREAST BIOPSY  1988   biopsy  . COLONOSCOPY     [place]. Around 2015  . ESOPHAGOGASTRODUODENOSCOPY  2016   [place]  . LAPAROSCOPIC HYSTERECTOMY     partial   . SHOULDER ARTHROSCOPY W/ LABRAL REPAIR Left 02/13/2016   left shoulder  . Study: cognitive eval  06/23/2016   mild cog-linguistic impairment, difficulty with verbal fluency  . Study: EEG   04/12/2015   Studies: EEG completed and normal.   . Study: MRI  04/28/2016   Impression: numerous non-enhancing flair hyperintense lesions w/in the brain .Suspicious for demyelinating disease such as lyme or MS. Her Lyme test were negative.     There were no vitals filed for this visit.  Subjective Assessment - 09/04/19 1021    Subjective  Pt reports that she is doing ok. Back really sore but ankles doing pretty good. Had aquatics yesterday.    Pertinent History  depression, headaches, HTN, MS (possible dx), OSA, post-consussion syndrome, TBI, TIA, osteopenia    Patient Stated Goals  wants to have better balance, feels more sure with herself - doesn't want to be scared of falling    Currently in  Pain?  Yes    Pain Score  9     Pain Location  Back    Pain Orientation  Lower    Pain Descriptors / Indicators  Aching    Pain Onset  More than a month ago    Pain Onset  More than a month ago                       [facility] Adult PT Treatment/Exercise - 09/04/19 1023      Ambulation/Gait   Ambulation/Gait  Yes    Ambulation/Gait Assistance  7: Independent    Ambulation Distance (Feet)  115 Feet    Assistive device  None    Gait Pattern  Step-through pattern    Gait Comments  Pt ambulated on treadmill x 6 min at 1.13mph covering 0.60miles then overground 115' independently. Pt was given cues to focus on long steps with heel strike. Pt reported being tired after gait on treadmill 4/10 RPE.       Neuro Re-ed    Neuro Re-ed Details   Pt performed tandem gait in // bars without UE support 8' x 4, side stepping along 2" foam beam for ankle and hip strategies 6' x 4. Tandem stance on 2" foam beam 30 sec x 2 each position, Standing on 2" foam beam for ankle stategy while playing catch x 10 tosses, Standing on foam beam with raising ball overhead x 10 and then side to side for trunk rotation x 10 close SBA. Pt was given verbal cues to tighten core to help with stability. Improved with practice on each activity.             PT Education - 09/04/19 1302    Education Details  Pt to continue with current HEP    Person(s) Educated  Patient    Methods  Explanation    Comprehension  Verbalized understanding       PT Short Term Goals - 08/21/19 1107      PT SHORT TERM GOAL #1   Title  Pt will be IND with HEP in order to build upon functional gains made in therapy. ALL STGS DUE 08/24/19    Baseline  Pt reports that she is doing exercises almost every day.    Time  4    Period  Weeks    Status  Achieved    Target Date  08/24/19      PT SHORT TERM GOAL #2   Title  Patient will improve FGA score to at least 19/30 in order to demonstrate decreased fall risk.    Baseline  22/30 on 08/21/19    Time  4    Period  Weeks    Status  Achieved      PT SHORT TERM GOAL #3   Title  Pt will perform floor<>stand txfs with supervision and with UE support to improve safety.    Baseline  Pt performed floor to stand transfer holding to chair and with nothing independently    Time  4    Period  Weeks    Status  Achieved      PT SHORT TERM GOAL #4   Title  Patient will begin aquatic therapy PT in order to improve strength, balance, and gait.    Baseline  Pt has started aquatic PT    Time  4    Period  Weeks    Status  Achieved      PT SHORT TERM  GOAL #5   Title  Patient will ambulate at least 230' over indoor/level surfaces while scanning environment with supervision with no LOB.    Baseline  Pt  ambulated inside >400' without AD scanning independently    Time  4    Period  Weeks    Status  Achieved        PT Long Term Goals - 08/28/19 [id number]      PT LONG TERM GOAL #1   Title  Pt will be independent with final HEP (including aquatics) in order to indicate improved balance and strength. ALL LTGS DUE 09/23/19    Time  8    Period  Weeks    Status  New      PT LONG TERM GOAL #2   Title  Patient will perform diaphragmatic breathing techniques in supine/sitting with no verbal cues in order to calm down nervous system and report decreased feelings of being overwhelmed.    Baseline  Pt reports that she has been using the diaphragmatic breathing when she feels anxious and it is helping.    Time  8    Period  Weeks    Status  Achieved      PT LONG TERM GOAL #3   Title  Patient will improve FGA to at least 23/30 in order to demo decreased fall risk.    Baseline  16/30 on 07/25/19, 22/30 on 08/21/19    Time  8    Period  Weeks    Status  On-going      PT LONG TERM GOAL #4   Title  Patient will ambulate at least 500' outdoors on paved surfaces while scanning environment with no LOB and suprevision in order to demonstrate improved community mobility.    Baseline  >800 on level floor inside independently with no LOB    Time  8    Period  Weeks    Status  Achieved      PT LONG TERM GOAL #5   Title  Patient will perform 12 steps with single handrail on R and step through vs. step to pattern with supervision in order to demonstrate improved safety while going up and down her flight of stairs at home.    Baseline  Pt performed 12 steps without railing with reciprocal pattern supervision. Able to perform with railing mod I.    Time  8    Period  Weeks    Status  Achieved            Plan - 09/04/19 1303    Clinical Impression Statement  Pt was able to improve stability on each exercise with practice. Pt fatigued on treadmill faster than overground but has good reciprocal steps.     Personal Factors and Comorbidities  Time since onset of injury/illness/exacerbation;Past/Current Experience;Comorbidity 3+    Comorbidities  depression, headaches, HTN, MS (possible dx), OSA, post-consussion syndrome, TBI, TIA, osteopenia    Examination-Activity Limitations  Stairs;Locomotion Level    Examination-Participation Restrictions  Community Activity    Stability/Clinical Decision Making  Stable/Uncomplicated    Rehab Potential  Good    PT Frequency  2x / week    PT Duration  8 weeks    PT Treatment/Interventions  ADLs/Self Care Home Management;Gait training;Stair training;Functional mobility training;Therapeutic activities;Therapeutic exercise;Balance training;Patient/family education;Neuromuscular re-education;Manual techniques;Aquatic Therapy    PT Next Visit Plan  Continue with ankle and hip strategy training with rocker board and foam, dynamic balance. SLS, balance focused on vestibular input.  PT Home Exercise Plan  [id number]    Consulted and Agree with Plan of Care  Patient       Patient will benefit from skilled therapeutic intervention in order to improve the following deficits and impairments:  Decreased balance, Decreased activity tolerance, Decreased mobility, Decreased strength, Difficulty walking, Abnormal gait  Visit Diagnosis: Other abnormalities of gait and mobility  Muscle weakness (generalized)     Problem List Patient Active Problem List   Diagnosis Date Noted  . CKD (chronic kidney disease) stage 3, GFR 30-59 ml/min 05/08/2019  . GERD (gastroesophageal reflux disease) 05/07/2019  . Overactive bladder 11/26/2018  . Raynaud disease 10/06/2018  . Encounter for monitoring long-term proton pump inhibitor therapy 08/28/2018  . White matter disease 02/20/2018  . Osteoporosis   . Hypercalcemia 01/17/2018  . Essential hypertension 01/16/2018  . Hypothyroidism 01/16/2018  . Depression with anxiety 01/16/2018  . Traumatic brain injury with loss of  consciousness ([place]) 01/16/2018  . Hyperlipidemia     Electa Sniff, PT, DPT, NCS 09/04/2019, 1:06 PM  [facility] [address] [place], [place], [address] Phone: [phone]   Fax:  [phone]  Name: Abigail Michael MRN: [id number] Date of Birth: Oct 12, 1951

## 2019-09-05 ENCOUNTER — Encounter: Payer: Self-pay | Admitting: Gastroenterology

## 2019-09-05 ENCOUNTER — Ambulatory Visit (AMBULATORY_SURGERY_CENTER): Payer: Medicare Other | Admitting: Gastroenterology

## 2019-09-05 VITALS — BP 138/77 | HR 64 | Temp 98.6°F | Resp 15 | Ht 62.0 in | Wt 172.0 lb

## 2019-09-05 DIAGNOSIS — D122 Benign neoplasm of ascending colon: Secondary | ICD-10-CM | POA: Diagnosis not present

## 2019-09-05 DIAGNOSIS — K2270 Barrett's esophagus without dysplasia: Secondary | ICD-10-CM | POA: Diagnosis not present

## 2019-09-05 DIAGNOSIS — K219 Gastro-esophageal reflux disease without esophagitis: Secondary | ICD-10-CM | POA: Diagnosis not present

## 2019-09-05 DIAGNOSIS — Z860100 Personal history of colon polyps, unspecified: Secondary | ICD-10-CM

## 2019-09-05 DIAGNOSIS — K5730 Diverticulosis of large intestine without perforation or abscess without bleeding: Secondary | ICD-10-CM

## 2019-09-05 DIAGNOSIS — K2950 Unspecified chronic gastritis without bleeding: Secondary | ICD-10-CM

## 2019-09-05 DIAGNOSIS — K640 First degree hemorrhoids: Secondary | ICD-10-CM

## 2019-09-05 DIAGNOSIS — D125 Benign neoplasm of sigmoid colon: Secondary | ICD-10-CM

## 2019-09-05 DIAGNOSIS — K449 Diaphragmatic hernia without obstruction or gangrene: Secondary | ICD-10-CM | POA: Diagnosis not present

## 2019-09-05 MED ORDER — SODIUM CHLORIDE 0.9 % IV SOLN: 500.0000 mL | Freq: Once | INTRAVENOUS | Status: DC

## 2019-09-05 NOTE — Op Note (Signed)
[facility] Patient Name: Abigail Michael Procedure Date: 09/05/2019 10:32 AM MRN: [id number] Endoscopist: Gerrit Heck , MD Age: 68 Referring MD:  Date of Birth: 07-19-1951 Gender: Female Account #: [id number] Procedure:                Colonoscopy Indications:              High risk colon cancer surveillance: Personal                            history of colonic polyps. Last colonoscopy was 5                            years ago at an outside facility in [place] and notable                            for polyps, with recommendation to repeat in 5                            years. Medicines:                Monitored Anesthesia Care Procedure:                Pre-Anesthesia Assessment:                           - Prior to the procedure, a History and Physical                            was performed, and patient medications and                            allergies were reviewed. The patient's tolerance of                            previous anesthesia was also reviewed. The risks                            and benefits of the procedure and the sedation                            options and risks were discussed with the patient.                            All questions were answered, and informed consent                            was obtained. Prior Anticoagulants: The patient has                            taken no previous anticoagulant or antiplatelet                            agents. ASA Grade Assessment: III - A patient with  severe systemic disease. After reviewing the risks                            and benefits, the patient was deemed in                            satisfactory condition to undergo the procedure.                           After obtaining informed consent, the colonoscope                            was passed under direct vision. Throughout the                            procedure, the patient's blood pressure, pulse, and                           oxygen saturations were monitored continuously. The                            Colonoscope was introduced through the anus and                            advanced to the the cecum, identified by                            appendiceal orifice and ileocecal valve. The                            colonoscopy was performed without difficulty. The                            patient tolerated the procedure well. The quality                            of the bowel preparation was adequate. The                            ileocecal valve, appendiceal orifice, and rectum                            were photographed. Scope In: 11:02:23 AM Scope Out: 11:24:48 AM Scope Withdrawal Time: 0 hours 19 minutes 7 seconds  Total Procedure Duration: 0 hours 22 minutes 25 seconds  Findings:                 The perianal and digital rectal examinations were                            normal.                           Four sessile polyps were found in the sigmoid colon                            (  3) and ascending colon (1). The polyps were 2 to 8                            mm in size. Two of these polyps were removed with a                            cold snare and two with cold forceps. Resection and                            retrieval were complete. Estimated blood loss was                            minimal.                           Multiple small and large-mouthed diverticula were                            found in the sigmoid colon and descending colon.                           Non-bleeding internal hemorrhoids were found during                            retroflexion. The hemorrhoids were small. Complications:            No immediate complications. Estimated Blood Loss:     Estimated blood loss was minimal. Impression:               - Four 2 to 8 mm polyps in the sigmoid colon and in                            the ascending colon, removed with a cold snare and                             cold forceps. Resected and retrieved.                           - Diverticulosis in the sigmoid colon and in the                            descending colon.                           - Non-bleeding internal hemorrhoids. Recommendation:           - Patient has a contact number available for                            emergencies. The signs and symptoms of potential                            delayed complications were discussed with the  patient. Return to normal activities tomorrow.                            Written discharge instructions were provided to the                            patient.                           - Resume previous diet.                           - Continue present medications.                           - Await pathology results.                           - Repeat colonoscopy for surveillance based on                            pathology results.                           - Use fiber, for example Citrucel, Fibercon, Konsyl                            or Metamucil.                           - Internal hemorrhoids were noted on this study and                            may be amenable to hemorrhoid band ligation. If you                            are interested in further treatment of these                            hemorrhoids with band ligation, please contact my                            clinic to set up an appointment for evaluation and                            treatment. Gerrit Heck, MD 09/05/2019 11:36:48 AM

## 2019-09-05 NOTE — Progress Notes (Signed)
A/ox3, pleased with MAC, report to RN 

## 2019-09-05 NOTE — Op Note (Signed)
[facility] Patient Name: Abigail Michael Procedure Date: 09/05/2019 10:32 AM MRN: [id number] Endoscopist: Gerrit Heck , MD Age: 68 Referring MD:  Date of Birth: Sep 18, 1951 Gender: Female Account #: [id number] Procedure:                Upper GI endoscopy Indications:              Esophageal reflux, Follow-up of Barrett's esophagus                           EGD in [place] in in 2016 demonstrated 3-4 cmHiatal                            hernia, incompetent LES,short segmentBarrett's                            esophagus (no path for review) and otherwise normal                            stomach/duodenum. Medicines:                Monitored Anesthesia Care Procedure:                Pre-Anesthesia Assessment:                           - Prior to the procedure, a History and Physical                            was performed, and patient medications and                            allergies were reviewed. The patient's tolerance of                            previous anesthesia was also reviewed. The risks                            and benefits of the procedure and the sedation                            options and risks were discussed with the patient.                            All questions were answered, and informed consent                            was obtained. Prior Anticoagulants: The patient has                            taken no previous anticoagulant or antiplatelet                            agents. ASA Grade Assessment: III - A patient with  severe systemic disease. After reviewing the risks                            and benefits, the patient was deemed in                            satisfactory condition to undergo the procedure.                           After obtaining informed consent, the endoscope was                            passed under direct vision. Throughout the                            procedure, the patient's blood  pressure, pulse, and                            oxygen saturations were monitored continuously. The                            Endoscope was introduced through the mouth, and                            advanced to the second part of duodenum. The upper                            GI endoscopy was accomplished without difficulty.                            The patient tolerated the procedure well. Scope In: Scope Out: Findings:                 Esophagogastric landmarks were identified: the                            Z-line was found at 32 cm, the gastroesophageal                            junction was found at 35 cm and the site of hiatal                            narrowing was found at 40 cm from the incisors.                           Circumferential salmon-colored mucosa was present                            from 32 to 35 cm. No other visible abnormalities                            were present. The maximum longitudinal extent of  these esophageal mucosal changes was 3 cm in                            length. Biopsies were taken at 35 cm, 33 cm, and 32                            cm with a cold forceps for histology. Estimated                            blood loss was minimal.                           A 5 cm hiatal hernia was present.                           The gastroesophageal flap valve was visualized                            endoscopically and classified as Hill Grade IV (no                            fold, wide open lumen, hiatal hernia present).                           The entire examined stomach was normal.                           The duodenal bulb, first portion of the duodenum                            and second portion of the duodenum were normal. Complications:            No immediate complications. Estimated Blood Loss:     Estimated blood loss was minimal. Impression:               - Esophagogastric landmarks identified.                            - Salmon-colored mucosa suspicious for                            short-segment Barrett's esophagus ([place]                            classification C3M3). Biopsied.                           - 5 cm hiatal hernia.                           - Gastroesophageal flap valve classified as Hill                            Grade IV (no fold, wide open lumen, hiatal hernia  present).                           - Normal stomach.                           - Normal duodenal bulb, first portion of the                            duodenum and second portion of the duodenum. Recommendation:           - Patient has a contact number available for                            emergencies. The signs and symptoms of potential                            delayed complications were discussed with the                            patient. Return to normal activities tomorrow.                            Written discharge instructions were provided to the                            patient.                           - Resume previous diet.                           - Continue present medications.                           - Await pathology results.                           - Repeat upper endoscopy for surveillance based on                            pathology results.                           - Return to GI clinic at appointment to be                            scheduled. Gerrit Heck, MD 09/05/2019 11:31:51 AM

## 2019-09-05 NOTE — Progress Notes (Signed)
Called to room to assist during endoscopic procedure.  Patient ID and intended procedure confirmed with present staff. Received instructions for my participation in the procedure from the performing physician.  

## 2019-09-05 NOTE — Patient Instructions (Signed)
HANDOUTS PROVIDED ON: HIATAL HERNIA, POLYPS, DIVERTICULOSIS, & HEMORRHOIDS   THE POLYPS AND BIOPSIES TAKEN TODAY HAVE BEEN SENT FOR PATHOLOGY.  THE RESULTS CAN TAKE 2-3 WEEKS TO RECEIVE.  BASED ON THE RESULTS IS WHEN YOUR NEXT COLONOSCOPY WILL BE RECOMMENDED.  YOU MAY RESUME YOUR PREVIOUS DIET AND MEDICATION SCHEDULE.  PLEASE ADD A FIBER SUPPLEMENT TO YOUR DIET.  SOME EXAMPLE ARE CITRUCEL, FIBERCON, KONSYL, OR METAMUCIL.  [place] YOU FOR ALLOWING [place] TO CARE FOR YOU TODAY!!!  YOU HAD AN ENDOSCOPIC PROCEDURE TODAY AT [facility]:   Refer to the procedure report that was given to you for any specific questions about what was found during the examination.  If the procedure report does not answer your questions, please call your gastroenterologist to clarify.  If you requested that your care partner not be given the details of your procedure findings, then the procedure report has been included in a sealed envelope for you to review at your convenience later.  YOU SHOULD EXPECT: Some feelings of bloating in the abdomen. Passage of more gas than usual.  Walking can help get rid of the air that was put into your GI tract during the procedure and reduce the bloating. If you had a lower endoscopy (such as a colonoscopy or flexible sigmoidoscopy) you may notice spotting of blood in your stool or on the toilet paper. If you underwent a bowel prep for your procedure, you may not have a normal bowel movement for a few days.  Please Note:  You might notice some irritation and congestion in your nose or some drainage.  This is from the oxygen used during your procedure.  There is no need for concern and it should clear up in a day or so.  SYMPTOMS TO REPORT IMMEDIATELY:   Following lower endoscopy (colonoscopy or flexible sigmoidoscopy):  Excessive amounts of blood in the stool  Significant tenderness or worsening of abdominal pains  Swelling of the abdomen that is new, acute  Fever of 100F or  higher   Following upper endoscopy (EGD)  Vomiting of blood or coffee ground material  New chest pain or pain under the shoulder blades  Painful or persistently difficult swallowing  New shortness of breath  Fever of 100F or higher  Black, tarry-looking stools  For urgent or emergent issues, a gastroenterologist can be reached at any hour by calling [phone].   DIET:  We do recommend a small meal at first, but then you may proceed to your regular diet.  Drink plenty of fluids but you should avoid alcoholic beverages for 24 hours.  ACTIVITY:  You should plan to take it easy for the rest of today and you should NOT DRIVE or use heavy machinery until tomorrow (because of the sedation medicines used during the test).    FOLLOW UP: Our staff will call the number listed on your records 48-72 hours following your procedure to check on you and address any questions or concerns that you may have regarding the information given to you following your procedure. If we do not reach you, we will leave a message.  We will attempt to reach you two times.  During this call, we will ask if you have developed any symptoms of COVID 19. If you develop any symptoms (ie: fever, flu-like symptoms, shortness of breath, cough etc.) before then, please call [phone].  If you test positive for Covid 19 in the 2 weeks post procedure, please call and report this information to  [place].    If any biopsies were taken you will be contacted by phone or by letter within the next 1-3 weeks.  Please call us at [phone] if you have not heard about the biopsies in 3 weeks.    SIGNATURES/CONFIDENTIALITY: You and/or your care partner have signed paperwork which will be entered into your electronic medical record.  These signatures attest to the fact that that the information above on your After Visit Summary has been reviewed and is understood.  Full responsibility of the confidentiality of this discharge information  lies with you and/or your care-partner.

## 2019-09-05 NOTE — Progress Notes (Signed)
Pt's states no medical or surgical changes since previsit or office visit.  JB - temp CW - vitals. 

## 2019-09-06 ENCOUNTER — Ambulatory Visit: Payer: Medicare Other

## 2019-09-07 ENCOUNTER — Telehealth: Payer: Self-pay | Admitting: *Deleted

## 2019-09-07 NOTE — Telephone Encounter (Signed)
1. Have you developed a fever since your procedure? no  2.   Have you had an respiratory symptoms (SOB or cough) since your procedure? no  3.   Have you tested positive for COVID 19 since your procedure no  4.   Have you had any family members/close contacts diagnosed with the COVID 19 since your procedure?  no   If yes to any of these questions please route to Joylene [name], RN and Alphonsa Gin, Therapist, sports.  Follow up Call-  Call back number 09/05/2019  Post procedure Call Back phone  # [phone]  Permission to leave phone message Yes  Some recent data might be hidden     Patient questions:  Do you have a fever, pain , or abdominal swelling? No. Pain Score  0 *  Have you tolerated food without any problems? Yes.    Have you been able to return to your normal activities? Yes.    Do you have any questions about your discharge instructions: Diet   No. Medications  No. Follow up visit  No.  Do you have questions or concerns about your Care? No.  Actions: * If pain score is 4 or above: No action needed, pain <4.

## 2019-09-10 ENCOUNTER — Other Ambulatory Visit: Payer: Self-pay

## 2019-09-10 ENCOUNTER — Ambulatory Visit: Payer: Medicare Other

## 2019-09-10 ENCOUNTER — Other Ambulatory Visit: Payer: Self-pay | Admitting: Family Medicine

## 2019-09-10 DIAGNOSIS — R2681 Unsteadiness on feet: Secondary | ICD-10-CM | POA: Diagnosis not present

## 2019-09-10 DIAGNOSIS — R2689 Other abnormalities of gait and mobility: Secondary | ICD-10-CM | POA: Diagnosis not present

## 2019-09-10 DIAGNOSIS — M6281 Muscle weakness (generalized): Secondary | ICD-10-CM | POA: Diagnosis not present

## 2019-09-10 NOTE — Therapy (Signed)
[facility] [address] [place], [place], [address] Phone: [phone]   Fax:  [phone]  Physical Therapy Treatment  Patient Details  Name: Abigail Michael MRN: [id number] Date of Birth: [birth year] Referring Provider (PT): Dr. Raoul Pitch   Encounter Date: 09/10/2019  PT End of Session - 09/10/19 0934    Visit Number  13    Number of Visits  17    Date for PT Re-Evaluation  10/22/19   written for 90 day POC   Authorization Type  Medicare + BCBS    PT Start Time  0932    PT Stop Time  1012    PT Time Calculation (min)  40 min    Equipment Utilized During Treatment  Other (comment)   buoyancy ankle cuffs and pool noodle   Activity Tolerance  Patient tolerated treatment well    Behavior During Therapy  [facility] for tasks assessed/performed       Past Medical History:  Diagnosis Date  . Acid reflux   . Brain injury ([place]) 2015   Due to a fall   . 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 Chicken pox   . Constipation   . Depression   . Frequent headaches   . GERD (gastroesophageal reflux disease)   . 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 Hyperlipidemia   . Hypertension   . MS (multiple sclerosis) ([place]) 12/15/2016   possible dx. many MRI w/ positive white matter abnormality. MRI reported stable since 2015. mild progressive  Cognitive loss  . Obstructive sleep apnea    on prior neuro notes 10/2016; no results.   . Osteoporosis    had been on reclast  . Post concussion syndrome 10/2013   ongoing issues with concentration, memory loss, lability; EEG completed and normal.   . TBI (traumatic brain injury) ([place]) 2014   fall at [facility], hit head off of floor. No bleed, "concussion". Seen neurology since for focus and word finding.   [facility] Kitchen TIA (transient ischemic attack) 11/30/2016   MRI normal. No lateralization. Speech affected and weakness during a hypotensive episdoe.   . Urinary  incontinence     Past Surgical History:  Procedure Laterality Date  . BACK SURGERY  02/2015   herniated disc L4/5/6  . BREAST BIOPSY  1988   biopsy  . COLONOSCOPY     [place]. Around 2015  . ESOPHAGOGASTRODUODENOSCOPY  2016   [place]  . LAPAROSCOPIC HYSTERECTOMY     partial   . SHOULDER ARTHROSCOPY W/ LABRAL REPAIR Left 02/13/2016   left shoulder  . Study: cognitive eval  06/23/2016   mild cog-linguistic impairment, difficulty with verbal fluency  . Study: EEG   04/12/2015   Studies: EEG completed and normal.   . Study: MRI  04/28/2016   Impression: numerous non-enhancing flair hyperintense lesions w/in the brain .Suspicious for demyelinating disease such as lyme or MS. Her Lyme test were negative.     There were no vitals filed for this visit.  Subjective Assessment - 09/10/19 0934    Subjective  Pt report that she is doing well.    Pertinent History  depression, headaches, HTN, MS (possible dx), OSA, post-consussion syndrome, TBI, TIA, osteopenia    Patient Stated Goals  wants to have better balance, feels more sure with herself - doesn't want to be scared of falling    Currently in Pain?  Yes    Pain Score  3  Pain Location  Back    Pain Orientation  Lower    Pain Descriptors / Indicators  Aching    Pain Type  Chronic pain    Pain Onset  More than a month ago    Pain Frequency  Intermittent    Pain Onset  More than a month ago                       [facility] Adult PT Treatment/Exercise - 09/10/19 0937      Ambulation/Gait   Ambulation/Gait  Yes    Ambulation Distance (Feet)  80 Feet   in to and out of clinic without AD   Assistive device  None    Gait Pattern  Step-through pattern      Neuro Re-ed    Neuro Re-ed Details   Tandem gait on foam in // bars x 3 laps down and back without UE support. Pt improved her stability as went on. Tandem gait in // bars without UE support x 2 laps. Gait in // bars with eyes closed x 3 laps. Pt with  significant decrease in speed and stability with gait. Pt was instructed to try to visualize path when closes eyes. Standing on airex with eyes closed x 30 sec then with head turns left/right and up/down with eyes closed x 10 each. Rockerboard trying to maintain level in ant/post position then adding in eyes closed x 30 sec then head turns left/right and up/down x 10. Alternating taps on soccerball x 10 without UE support. SLS with 1 foot on soccerball 30 sec x 2 eaach position with occasional UE support. More dificulty on left leg. Side stepping on half foam 3' x 3 laps. Pt was instructed to tighten core to help with stability.      Exercises   Exercises  --             PT Education - 09/10/19 1540    Education Details  Pt to continue with current HEP    Person(s) Educated  Patient    Methods  Explanation    Comprehension  Verbalized understanding       PT Short Term Goals - 08/21/19 1107      PT SHORT TERM GOAL #1   Title  Pt will be IND with HEP in order to build upon functional gains made in therapy. ALL STGS DUE 08/24/19    Baseline  Pt reports that she is doing exercises almost every day.    Time  4    Period  Weeks    Status  Achieved    Target Date  08/24/19      PT SHORT TERM GOAL #2   Title  Patient will improve FGA score to at least 19/30 in order to demonstrate decreased fall risk.    Baseline  22/30 on 08/21/19    Time  4    Period  Weeks    Status  Achieved      PT SHORT TERM GOAL #3   Title  Pt will perform floor<>stand txfs with supervision and with UE support to improve safety.    Baseline  Pt performed floor to stand transfer holding to chair and with nothing independently    Time  4    Period  Weeks    Status  Achieved      PT SHORT TERM GOAL #4   Title  Patient will begin aquatic therapy PT in order to improve strength, balance, and  gait.    Baseline  Pt has started aquatic PT    Time  4    Period  Weeks    Status  Achieved      PT SHORT TERM GOAL  #5   Title  Patient will ambulate at least 230' over indoor/level surfaces while scanning environment with supervision with no LOB.    Baseline  Pt ambulated inside >400' without AD scanning independently    Time  4    Period  Weeks    Status  Achieved        PT Long Term Goals - 08/28/19 [id number]      PT LONG TERM GOAL #1   Title  Pt will be independent with final HEP (including aquatics) in order to indicate improved balance and strength. ALL LTGS DUE 09/23/19    Time  8    Period  Weeks    Status  New      PT LONG TERM GOAL #2   Title  Patient will perform diaphragmatic breathing techniques in supine/sitting with no verbal cues in order to calm down nervous system and report decreased feelings of being overwhelmed.    Baseline  Pt reports that she has been using the diaphragmatic breathing when she feels anxious and it is helping.    Time  8    Period  Weeks    Status  Achieved      PT LONG TERM GOAL #3   Title  Patient will improve FGA to at least 23/30 in order to demo decreased fall risk.    Baseline  16/30 on 07/25/19, 22/30 on 08/21/19    Time  8    Period  Weeks    Status  On-going      PT LONG TERM GOAL #4   Title  Patient will ambulate at least 500' outdoors on paved surfaces while scanning environment with no LOB and suprevision in order to demonstrate improved community mobility.    Baseline  >800 on level floor inside independently with no LOB    Time  8    Period  Weeks    Status  Achieved      PT LONG TERM GOAL #5   Title  Patient will perform 12 steps with single handrail on R and step through vs. step to pattern with supervision in order to demonstrate improved safety while going up and down her flight of stairs at home.    Baseline  Pt performed 12 steps without railing with reciprocal pattern supervision. Able to perform with railing mod I.    Time  8    Period  Weeks    Status  Achieved            Plan - 09/10/19 1541    Clinical Impression  Statement  Pt continues to show improvement with balance activities.    Personal Factors and Comorbidities  Time since onset of injury/illness/exacerbation;Past/Current Experience;Comorbidity 3+    Comorbidities  depression, headaches, HTN, MS (possible dx), OSA, post-consussion syndrome, TBI, TIA, osteopenia    Examination-Activity Limitations  Stairs;Locomotion Level    Examination-Participation Restrictions  Community Activity    Stability/Clinical Decision Making  Stable/Uncomplicated    Rehab Potential  Good    PT Frequency  2x / week    PT Duration  8 weeks    PT Treatment/Interventions  ADLs/Self Care Home Management;Gait training;Stair training;Functional mobility training;Therapeutic activities;Therapeutic exercise;Balance training;Patient/family education;Neuromuscular re-education;Manual techniques;Aquatic Therapy    PT Next Visit Plan  dynamic  gait activities    PT Home Exercise Plan  [id number]    Consulted and Agree with Plan of Care  Patient       Patient will benefit from skilled therapeutic intervention in order to improve the following deficits and impairments:  Decreased balance, Decreased activity tolerance, Decreased mobility, Decreased strength, Difficulty walking, Abnormal gait  Visit Diagnosis: Other abnormalities of gait and mobility     Problem List Patient Active Problem List   Diagnosis Date Noted  . CKD (chronic kidney disease) stage 3, GFR 30-59 ml/min 05/08/2019  . GERD (gastroesophageal reflux disease) 05/07/2019  . Overactive bladder 11/26/2018  . Raynaud disease 10/06/2018  . Encounter for monitoring long-term proton pump inhibitor therapy 08/28/2018  . White matter disease 02/20/2018  . Osteoporosis   . Hypercalcemia 01/17/2018  . Essential hypertension 01/16/2018  . Hypothyroidism 01/16/2018  . Depression with anxiety 01/16/2018  . Traumatic brain injury with loss of consciousness ([place]) 01/16/2018  . Hyperlipidemia     Electa Sniff, PT,  DPT, NCS 09/10/2019, 3:42 PM  [facility] [address] [place], [place], [address] Phone: [phone]   Fax:  [phone]  Name: Chenika Morgenstern MRN: [id number] Date of Birth: Oct 19, 1950

## 2019-09-11 ENCOUNTER — Ambulatory Visit: Payer: Medicare Other

## 2019-09-11 ENCOUNTER — Telehealth: Payer: Self-pay

## 2019-09-11 ENCOUNTER — Encounter: Payer: Self-pay | Admitting: Gastroenterology

## 2019-09-11 DIAGNOSIS — R2689 Other abnormalities of gait and mobility: Secondary | ICD-10-CM | POA: Diagnosis not present

## 2019-09-11 DIAGNOSIS — R2681 Unsteadiness on feet: Secondary | ICD-10-CM | POA: Diagnosis not present

## 2019-09-11 DIAGNOSIS — M6281 Muscle weakness (generalized): Secondary | ICD-10-CM | POA: Diagnosis not present

## 2019-09-11 NOTE — Telephone Encounter (Signed)
Patient has been scheduled for OV on 10/17/2019 at 11:00 am per [facility]

## 2019-09-11 NOTE — Therapy (Signed)
[facility] [address] [place], [place], [address] Phone: [phone]   Fax:  [phone]  Physical Therapy Treatment  Patient Details  Name: Abigail Michael MRN: [id number] Date of Birth: 1951-08-01 Referring Provider (PT): Dr. Raoul Pitch   Encounter Date: 09/11/2019  PT End of Session - 09/11/19 1020    Visit Number  14    Number of Visits  17    Date for PT Re-Evaluation  10/22/19   written for 90 day POC   Authorization Type  Medicare + BCBS    PT Start Time  1018    PT Stop Time  1058    PT Time Calculation (min)  40 min    Equipment Utilized During Treatment  Other (comment)   buoyancy ankle cuffs and pool noodle   Activity Tolerance  Patient tolerated treatment well    Behavior During Therapy  [facility] for tasks assessed/performed       Past Medical History:  Diagnosis Date  . Acid reflux   . Brain injury ([place]) 2015   Due to a fall   . 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 Chicken pox   . Constipation   . Depression   . Frequent headaches   . GERD (gastroesophageal reflux disease)   . 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 Hyperlipidemia   . Hypertension   . MS (multiple sclerosis) ([place]) 12/15/2016   possible dx. many MRI w/ positive white matter abnormality. MRI reported stable since 2015. mild progressive  Cognitive loss  . Obstructive sleep apnea    on prior neuro notes 10/2016; no results.   . Osteoporosis    had been on reclast  . Post concussion syndrome 10/2013   ongoing issues with concentration, memory loss, lability; EEG completed and normal.   . TBI (traumatic brain injury) ([place]) 2014   fall at [facility], hit head off of floor. No bleed, "concussion". Seen neurology since for focus and word finding.   Marland Kitchen TIA (transient ischemic attack) 11/30/2016   MRI normal. No lateralization. Speech affected and weakness during a hypotensive episdoe.   . Urinary  incontinence     Past Surgical History:  Procedure Laterality Date  . BACK SURGERY  02/2015   herniated disc L4/5/6  . BREAST BIOPSY  1988   biopsy  . COLONOSCOPY     [place]. Around 2015  . ESOPHAGOGASTRODUODENOSCOPY  2016   [place]  . LAPAROSCOPIC HYSTERECTOMY     partial   . SHOULDER ARTHROSCOPY W/ LABRAL REPAIR Left 02/13/2016   left shoulder  . Study: cognitive eval  06/23/2016   mild cog-linguistic impairment, difficulty with verbal fluency  . Study: EEG   04/12/2015   Studies: EEG completed and normal.   . Study: MRI  04/28/2016   Impression: numerous non-enhancing flair hyperintense lesions w/in the brain .Suspicious for demyelinating disease such as lyme or MS. Her Lyme test were negative.     There were no vitals filed for this visit.  Subjective Assessment - 09/11/19 1020    Subjective  Pt reports that back a little sore but was very busy yesterday.    Pertinent History  depression, headaches, HTN, MS (possible dx), OSA, post-consussion syndrome, TBI, TIA, osteopenia    Patient Stated Goals  wants to have better balance, feels more sure with herself - doesn't want to be scared of falling    Currently in Pain?  Yes  Pain Score  4     Pain Location  Back    Pain Descriptors / Indicators  Aching    Pain Type  Chronic pain    Pain Onset  More than a month ago    Pain Onset  More than a month ago                       [facility] Adult PT Treatment/Exercise - 09/11/19 1030      Ambulation/Gait   Ambulation/Gait  Yes    Gait Comments  0.25 miles on treadmill x 8 min at 1.38mph. Pt had good reciprocal pattern. Reports back and hips a little sore.       Neuro Re-ed    Neuro Re-ed Details   Dynamic gait activities in hallway 40' x 2 with all: marching gait, gait with head turns left/right (increased sway and staggered gait at times), gait with head turns up/down, side stepping gait, backwards gait, toe walking, heel walking, eyes closed, tandem  gait. Pt has decreased stability with tandem gait and found heel walking challenging. Close SBA for safety with activities.             PT Education - 09/11/19 2049    Education Details  Pt to continue with current HEP    Person(s) Educated  Patient    Methods  Explanation    Comprehension  Verbalized understanding       PT Short Term Goals - 08/21/19 1107      PT SHORT TERM GOAL #1   Title  Pt will be IND with HEP in order to build upon functional gains made in therapy. ALL STGS DUE 08/24/19    Baseline  Pt reports that she is doing exercises almost every day.    Time  4    Period  Weeks    Status  Achieved    Target Date  08/24/19      PT SHORT TERM GOAL #2   Title  Patient will improve FGA score to at least 19/30 in order to demonstrate decreased fall risk.    Baseline  22/30 on 08/21/19    Time  4    Period  Weeks    Status  Achieved      PT SHORT TERM GOAL #3   Title  Pt will perform floor<>stand txfs with supervision and with UE support to improve safety.    Baseline  Pt performed floor to stand transfer holding to chair and with nothing independently    Time  4    Period  Weeks    Status  Achieved      PT SHORT TERM GOAL #4   Title  Patient will begin aquatic therapy PT in order to improve strength, balance, and gait.    Baseline  Pt has started aquatic PT    Time  4    Period  Weeks    Status  Achieved      PT SHORT TERM GOAL #5   Title  Patient will ambulate at least 230' over indoor/level surfaces while scanning environment with supervision with no LOB.    Baseline  Pt ambulated inside >400' without AD scanning independently    Time  4    Period  Weeks    Status  Achieved        PT Long Term Goals - 08/28/19 [id number]      PT LONG TERM GOAL #1   Title  Pt will be  independent with final HEP (including aquatics) in order to indicate improved balance and strength. ALL LTGS DUE 09/23/19    Time  8    Period  Weeks    Status  New      PT LONG TERM GOAL  #2   Title  Patient will perform diaphragmatic breathing techniques in supine/sitting with no verbal cues in order to calm down nervous system and report decreased feelings of being overwhelmed.    Baseline  Pt reports that she has been using the diaphragmatic breathing when she feels anxious and it is helping.    Time  8    Period  Weeks    Status  Achieved      PT LONG TERM GOAL #3   Title  Patient will improve FGA to at least 23/30 in order to demo decreased fall risk.    Baseline  16/30 on 07/25/19, 22/30 on 08/21/19    Time  8    Period  Weeks    Status  On-going      PT LONG TERM GOAL #4   Title  Patient will ambulate at least 500' outdoors on paved surfaces while scanning environment with no LOB and suprevision in order to demonstrate improved community mobility.    Baseline  >800 on level floor inside independently with no LOB    Time  8    Period  Weeks    Status  Achieved      PT LONG TERM GOAL #5   Title  Patient will perform 12 steps with single handrail on R and step through vs. step to pattern with supervision in order to demonstrate improved safety while going up and down her flight of stairs at home.    Baseline  Pt performed 12 steps without railing with reciprocal pattern supervision. Able to perform with railing mod I.    Time  8    Period  Weeks    Status  Achieved            Plan - 09/11/19 2050    Clinical Impression Statement  Pt continues to show improvement with dynamic gait activities.    Personal Factors and Comorbidities  Time since onset of injury/illness/exacerbation;Past/Current Experience;Comorbidity 3+    Comorbidities  depression, headaches, HTN, MS (possible dx), OSA, post-consussion syndrome, TBI, TIA, osteopenia    Examination-Activity Limitations  Stairs;Locomotion Level    Examination-Participation Restrictions  Community Activity    Stability/Clinical Decision Making  Stable/Uncomplicated    Rehab Potential  Good    PT Frequency  2x /  week    PT Duration  8 weeks    PT Treatment/Interventions  ADLs/Self Care Home Management;Gait training;Stair training;Functional mobility training;Therapeutic activities;Therapeutic exercise;Balance training;Patient/family education;Neuromuscular re-education;Manual techniques;Aquatic Therapy    PT Next Visit Plan  dynamic gait activities, balance, reassess as orders end next week being week 8.    PT Home Exercise Plan  [id number]    Consulted and Agree with Plan of Care  Patient       Patient will benefit from skilled therapeutic intervention in order to improve the following deficits and impairments:  Decreased balance, Decreased activity tolerance, Decreased mobility, Decreased strength, Difficulty walking, Abnormal gait  Visit Diagnosis: Other abnormalities of gait and mobility  Muscle weakness (generalized)     Problem List Patient Active Problem List   Diagnosis Date Noted  . CKD (chronic kidney disease) stage 3, GFR 30-59 ml/min 05/08/2019  . GERD (gastroesophageal reflux disease) 05/07/2019  . Overactive bladder 11/26/2018  .  Raynaud disease 10/06/2018  . Encounter for monitoring long-term proton pump inhibitor therapy 08/28/2018  . White matter disease 02/20/2018  . Osteoporosis   . Hypercalcemia 01/17/2018  . Essential hypertension 01/16/2018  . Hypothyroidism 01/16/2018  . Depression with anxiety 01/16/2018  . Traumatic brain injury with loss of consciousness ([place]) 01/16/2018  . Hyperlipidemia     Electa Sniff, PT, DPT, NCS 09/11/2019, 8:55 PM  [facility] [address] [place], [place], [address] Phone: [phone]   Fax:  [phone]  Name: Abigail Michael MRN: [id number] Date of Birth: 02/09/1951

## 2019-09-11 NOTE — Telephone Encounter (Signed)
Left message for patient to call back to the office- Patient needs to schedule an OV- 4 wk f/u post EGD/colon-esophageal reflux/f/u Barrett's esophagus/personal hx of colonic polyps/Cirigliano

## 2019-09-18 ENCOUNTER — Other Ambulatory Visit: Payer: Self-pay

## 2019-09-18 ENCOUNTER — Ambulatory Visit: Payer: Medicare Other | Attending: Family Medicine

## 2019-09-18 DIAGNOSIS — R2689 Other abnormalities of gait and mobility: Secondary | ICD-10-CM | POA: Diagnosis present

## 2019-09-18 NOTE — Therapy (Signed)
[facility] [address] [place], [place], [address] Phone: [phone]   Fax:  [phone]  Physical Therapy Treatment/Discharge Summary  Patient Details  Name: Abigail Michael MRN: [id number] Date of Birth: December 31, 1950 Referring Provider (PT): Dr. Raoul Pitch PHYSICAL THERAPY DISCHARGE SUMMARY  Visits from Start of Care: 15  Current functional level related to goals / functional outcomes: Pt is ambulating independently with no AD. Balance has improved with current FGA score indicating low fall risk. See Goals for more information.   Remaining deficits: Continues to be challenged by high level balance activities with narrow BOS.   Education / Equipment: HEP  Plan: Patient agrees to discharge.  Patient goals were met. Patient is being discharged due to meeting the stated rehab goals.  ?????        Encounter Date: 09/18/2019  PT End of Session - 09/18/19 1024    Visit Number  15    Number of Visits  17    Date for PT Re-Evaluation  10/22/19   written for 90 day POC   Authorization Type  Medicare + BCBS    PT Start Time  1022   PT running behind   PT Stop Time  1054    PT Time Calculation (min)  32 min    Equipment Utilized During Treatment  Other (comment)   buoyancy ankle cuffs and pool noodle   Activity Tolerance  Patient tolerated treatment well    Behavior During Therapy  W[facility] for tasks assessed/performed       Past Medical History:  Diagnosis Date  . Acid reflux   . Brain injury ([place]) 2015   Due to a fall   . 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 Chicken pox   . Constipation   . Depression   . Frequent headaches   . GERD (gastroesophageal reflux disease)   . 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 Hyperlipidemia   . Hypertension   . MS (multiple sclerosis) ([place]) 12/15/2016   possible dx. many MRI w/ positive white matter abnormality. MRI reported stable since  2015. mild progressive  Cognitive loss  . Obstructive sleep apnea    on prior neuro notes 10/2016; no results.   . Osteoporosis    had been on reclast  . Post concussion syndrome 10/2013   ongoing issues with concentration, memory loss, lability; EEG completed and normal.   . TBI (traumatic brain injury) ([place]) 2014   fall at [facility], hit head off of floor. No bleed, "concussion". Seen neurology since for focus and word finding.   Marland Kitchen TIA (transient ischemic attack) 11/30/2016   MRI normal. No lateralization. Speech affected and weakness during a hypotensive episdoe.   . Urinary incontinence     Past Surgical History:  Procedure Laterality Date  . BACK SURGERY  02/2015   herniated disc L4/5/6  . BREAST BIOPSY  1988   biopsy  . COLONOSCOPY     [place]. Around 2015  . ESOPHAGOGASTRODUODENOSCOPY  2016   [place]  . LAPAROSCOPIC HYSTERECTOMY     partial   . SHOULDER ARTHROSCOPY W/ LABRAL REPAIR Left 02/13/2016   left shoulder  . Study: cognitive eval  06/23/2016   mild cog-linguistic impairment, difficulty with verbal fluency  . Study: EEG   04/12/2015   Studies: EEG completed and normal.   . Study: MRI  04/28/2016   Impression: numerous non-enhancing flair hyperintense lesions w/in the brain .Suspicious for demyelinating  disease such as lyme or MS. Her Lyme test were negative.     There were no vitals filed for this visit.  Subjective Assessment - 09/18/19 1024    Subjective  Pt reports that she is doing well. Feels much more comfortable with all her exercises and her concentration for doing them is better.    Pertinent History  depression, headaches, HTN, MS (possible dx), OSA, post-consussion syndrome, TBI, TIA, osteopenia    Patient Stated Goals  wants to have better balance, feels more sure with herself - doesn't want to be scared of falling    Currently in Pain?  Yes    Pain Score  2     Pain Location  Back    Pain Orientation  Lower    Pain Descriptors /  Indicators  Aching    Pain Type  Chronic pain    Pain Onset  More than a month ago    Pain Frequency  Intermittent    Pain Onset  More than a month ago         [facility] PT Assessment - 09/18/19 1027      Functional Gait  Assessment   Gait assessed   Yes    Gait Level Surface  Walks 20 ft in less than 5.5 sec, no assistive devices, good speed, no evidence for imbalance, normal gait pattern, deviates no more than 6 in outside of the 12 in walkway width.    Change in Gait Speed  Able to smoothly change walking speed without loss of balance or gait deviation. Deviate no more than 6 in outside of the 12 in walkway width.    Gait with Horizontal Head Turns  Performs head turns smoothly with slight change in gait velocity (eg, minor disruption to smooth gait path), deviates 6-10 in outside 12 in walkway width, or uses an assistive device.    Gait with Vertical Head Turns  Performs task with slight change in gait velocity (eg, minor disruption to smooth gait path), deviates 6 - 10 in outside 12 in walkway width or uses assistive device    Gait and Pivot Turn  Pivot turns safely within 3 sec and stops quickly with no loss of balance.    Step Over Obstacle  Is able to step over 2 stacked shoe boxes taped together (9 in total height) without changing gait speed. No evidence of imbalance.    Gait with Narrow Base of Support  Ambulates 7-9 steps.    Gait with Eyes Closed  Walks 20 ft, no assistive devices, good speed, no evidence of imbalance, normal gait pattern, deviates no more than 6 in outside 12 in walkway width. Ambulates 20 ft in less than 7 sec.    Ambulating Backwards  Walks 20 ft, uses assistive device, slower speed, mild gait deviations, deviates 6-10 in outside 12 in walkway width.    Steps  Alternating feet, no rail.    Total Score  26                   [facility] Adult PT Treatment/Exercise - 09/18/19 1049      Neuro Re-ed    Neuro Re-ed Details   Pt performed standing balance  exercises in corner: standing on pillow with feet together with head turns x 10 left/right and up/down x 10, eyes closed x 30 sec, tandem stance on floor 30 sec x 2 each position with increased sway but did improve as went on.  PT Education - 09/18/19 1125    Education Details  PT reviewed HEP and patient denies any questions.    Person(s) Educated  Patient    Methods  Explanation;Demonstration    Comprehension  Verbalized understanding       PT Short Term Goals - 08/21/19 1107      PT SHORT TERM GOAL #1   Title  Pt will be IND with HEP in order to build upon functional gains made in therapy. ALL STGS DUE 08/24/19    Baseline  Pt reports that she is doing exercises almost every day.    Time  4    Period  Weeks    Status  Achieved    Target Date  08/24/19      PT SHORT TERM GOAL #2   Title  Patient will improve FGA score to at least 19/30 in order to demonstrate decreased fall risk.    Baseline  22/30 on 08/21/19    Time  4    Period  Weeks    Status  Achieved      PT SHORT TERM GOAL #3   Title  Pt will perform floor<>stand txfs with supervision and with UE support to improve safety.    Baseline  Pt performed floor to stand transfer holding to chair and with nothing independently    Time  4    Period  Weeks    Status  Achieved      PT SHORT TERM GOAL #4   Title  Patient will begin aquatic therapy PT in order to improve strength, balance, and gait.    Baseline  Pt has started aquatic PT    Time  4    Period  Weeks    Status  Achieved      PT SHORT TERM GOAL #5   Title  Patient will ambulate at least 230' over indoor/level surfaces while scanning environment with supervision with no LOB.    Baseline  Pt ambulated inside >400' without AD scanning independently    Time  4    Period  Weeks    Status  Achieved        PT Long Term Goals - 09/18/19 1034      PT LONG TERM GOAL #1   Title  Pt will be independent with final HEP (including aquatics) in order  to indicate improved balance and strength. ALL LTGS DUE 09/23/19    Baseline  Pt reports that she has been working on her exercises as instructed. She denies any questions on exercises and also plans to join aquatic program    Time  8    Period  Weeks    Status  Achieved      PT LONG TERM GOAL #2   Title  Patient will perform diaphragmatic breathing techniques in supine/sitting with no verbal cues in order to calm down nervous system and report decreased feelings of being overwhelmed.    Baseline  Pt reports that she has been using the diaphragmatic breathing when she feels anxious and it is helping.    Time  8    Period  Weeks    Status  Achieved      PT LONG TERM GOAL #3   Title  Patient will improve FGA to at least 23/30 in order to demo decreased fall risk.    Baseline  09/18/2019 26/30    Time  8    Period  Weeks    Status  Achieved  PT LONG TERM GOAL #4   Title  Patient will ambulate at least 500' outdoors on paved surfaces while scanning environment with no LOB and suprevision in order to demonstrate improved community mobility.    Baseline  >800 on level floor inside independently with no LOB    Time  8    Period  Weeks    Status  Achieved      PT LONG TERM GOAL #5   Title  Patient will perform 12 steps with single handrail on R and step through vs. step to pattern with supervision in order to demonstrate improved safety while going up and down her flight of stairs at home.    Baseline  Pt performed 12 steps without railing with reciprocal pattern supervision. Able to perform with railing mod I.    Time  8    Period  Weeks    Status  Achieved            Plan - 09/18/19 1125    Clinical Impression Statement  Pt met FGA goal today showing continued improvement in balance and gait with decreased fall risk. Pt has now met all goals. Pt is ambulating without AD on varied surfaces. Pt feels balance is much improved. She feels comfortable continuing with her home program.  PT discharging today.    Personal Factors and Comorbidities  Time since onset of injury/illness/exacerbation;Past/Current Experience;Comorbidity 3+    Comorbidities  depression, headaches, HTN, MS (possible dx), OSA, post-consussion syndrome, TBI, TIA, osteopenia    Examination-Activity Limitations  Stairs;Locomotion Level    Examination-Participation Restrictions  Community Activity    Stability/Clinical Decision Making  Stable/Uncomplicated    Rehab Potential  Good    PT Frequency  2x / week    PT Duration  8 weeks    PT Treatment/Interventions  ADLs/Self Care Home Management;Gait training;Stair training;Functional mobility training;Therapeutic activities;Therapeutic exercise;Balance training;Patient/family education;Neuromuscular re-education;Manual techniques;Aquatic Therapy    PT Next Visit Plan  pt discharged today.    PT Home Exercise Plan  [id number]    Consulted and Agree with Plan of Care  Patient       Patient will benefit from skilled therapeutic intervention in order to improve the following deficits and impairments:  Decreased balance, Decreased activity tolerance, Decreased mobility, Decreased strength, Difficulty walking, Abnormal gait  Visit Diagnosis: Other abnormalities of gait and mobility     Problem List Patient Active Problem List   Diagnosis Date Noted  . CKD (chronic kidney disease) stage 3, GFR 30-59 ml/min 05/08/2019  . GERD (gastroesophageal reflux disease) 05/07/2019  . Overactive bladder 11/26/2018  . Raynaud disease 10/06/2018  . Encounter for monitoring long-term proton pump inhibitor therapy 08/28/2018  . White matter disease 02/20/2018  . Osteoporosis   . Hypercalcemia 01/17/2018  . Essential hypertension 01/16/2018  . Hypothyroidism 01/16/2018  . Depression with anxiety 01/16/2018  . Traumatic brain injury with loss of consciousness ([place]) 01/16/2018  . Hyperlipidemia     Electa Sniff, PT, DPT, NCS 09/18/2019, 11:28 AM  [facility] [address] [place], [place], [address] Phone: [phone]   Fax:  [phone]  Name: Williemae Muriel MRN: [id number] Date of Birth: 10/01/1951

## 2019-09-19 ENCOUNTER — Ambulatory Visit: Payer: Medicare Other | Admitting: Physical Therapy

## 2019-09-20 ENCOUNTER — Ambulatory Visit: Payer: Medicare Other

## 2019-09-26 ENCOUNTER — Ambulatory Visit: Payer: Self-pay | Admitting: Physical Therapy

## 2019-09-28 ENCOUNTER — Ambulatory Visit: Payer: Medicare Other

## 2019-10-04 ENCOUNTER — Telehealth: Payer: Self-pay | Admitting: Family Medicine

## 2019-10-04 NOTE — Telephone Encounter (Signed)
Patient is requesting that Dr. Raoul Pitch take over managing, refills, etc for:  MYRBETRIQ 50mg   (this has been increased since this prescription was entered Patient requests 90 d/s   Please send to [facility]   Patient can be reached at [phone]

## 2019-10-05 NOTE — Telephone Encounter (Signed)
Pt was called and told she would need appt for any medications that Dr Raoul Pitch was possobly taking over. She verbalized understanding. Appt was scheduled for Monday.

## 2019-10-08 ENCOUNTER — Encounter: Payer: Self-pay | Admitting: Family Medicine

## 2019-10-08 ENCOUNTER — Other Ambulatory Visit: Payer: Self-pay

## 2019-10-08 ENCOUNTER — Ambulatory Visit (INDEPENDENT_AMBULATORY_CARE_PROVIDER_SITE_OTHER): Payer: Medicare Other | Admitting: Family Medicine

## 2019-10-08 ENCOUNTER — Telehealth: Payer: Self-pay

## 2019-10-08 VITALS — BP 126/80 | HR 61 | Temp 97.6°F | Resp 17 | Ht 62.0 in | Wt 172.4 lb

## 2019-10-08 DIAGNOSIS — E782 Mixed hyperlipidemia: Secondary | ICD-10-CM

## 2019-10-08 DIAGNOSIS — E039 Hypothyroidism, unspecified: Secondary | ICD-10-CM | POA: Diagnosis not present

## 2019-10-08 DIAGNOSIS — F418 Other specified anxiety disorders: Secondary | ICD-10-CM

## 2019-10-08 DIAGNOSIS — S069X9S Unspecified intracranial injury with loss of consciousness of unspecified duration, sequela: Secondary | ICD-10-CM

## 2019-10-08 DIAGNOSIS — I10 Essential (primary) hypertension: Secondary | ICD-10-CM | POA: Diagnosis not present

## 2019-10-08 DIAGNOSIS — N3281 Overactive bladder: Secondary | ICD-10-CM

## 2019-10-08 DIAGNOSIS — R9082 White matter disease, unspecified: Secondary | ICD-10-CM

## 2019-10-08 DIAGNOSIS — N1831 Chronic kidney disease, stage 3a: Secondary | ICD-10-CM

## 2019-10-08 LAB — CBC
HCT: 41.9 % (ref 36.0–46.0)
Hemoglobin: 13.8 g/dL (ref 12.0–15.0)
MCHC: 32.8 g/dL (ref 30.0–36.0)
MCV: 87.7 fl (ref 78.0–100.0)
Platelets: 215 10*3/uL (ref 150.0–400.0)
RBC: 4.78 Mil/uL (ref 3.87–5.11)
RDW: 13 % (ref 11.5–15.5)
WBC: 5.4 10*3/uL (ref 4.0–10.5)

## 2019-10-08 LAB — BASIC METABOLIC PANEL
BUN: 10 mg/dL (ref 6–23)
CO2: 26 mEq/L (ref 19–32)
Calcium: 9.8 mg/dL (ref 8.4–10.5)
Chloride: 105 mEq/L (ref 96–112)
Creatinine, Ser: 0.87 mg/dL (ref 0.40–1.20)
GFR: 64.56 mL/min (ref >60.00)
Glucose, Bld: 97 mg/dL (ref 70–99)
Potassium: 4.6 mEq/L (ref 3.5–5.1)
Sodium: 137 mEq/L (ref 135–145)

## 2019-10-08 LAB — LIPID PANEL
Cholesterol: 174 mg/dL (ref 0–200)
HDL: 87.3 mg/dL (ref >39.00)
LDL Cholesterol: 78 mg/dL (ref 0–99)
NonHDL: 87.15
Total CHOL/HDL Ratio: 2
Triglycerides: 48 mg/dL (ref 0.0–149.0)
VLDL: 9.6 mg/dL (ref 0.0–40.0)

## 2019-10-08 MED ORDER — MIRABEGRON ER 50 MG PO TB24: 50.0000 mg | ORAL_TABLET | Freq: Every day | ORAL | 3 refills | Status: DC

## 2019-10-08 NOTE — Progress Notes (Signed)
Patient ID: Abigail Michael, female  DOB: August 01, 1951, 68 y.o.   MRN: [id number] Patient Care Team    Relationship Specialty Notifications Start End  Ma Hillock, DO PCP - General Family Medicine  01/16/18   Sharlene Dory, MD Referring Physician Neurology  08/28/18   Tommi Rumps  Dentistry  07/17/19   [name]  Orthodontics  07/17/19   Feliberto Harts R (Inactive)  Oral Surgery  07/17/19   Delanna Ahmadi, MD Referring Physician Psychiatry  07/17/19    Comment: Neurology    Chief Complaint  Patient presents with  . Medication Refill    Pt would like for Dr Raoul Pitch to take over Myrbetriq. Pt does not want to go back to Neuro she was seeing, who was RXing medication, she would like new referral to go to another Neuro     Subjective:  Abigail Michael is a 68 y.o.  female present for follow up  Depression with anxiety Doing well on prozac.she would like refills today. Prior note:  Has been on prozac for a little over a year after CVA. She reports she feels her depression and anxiety are worsening. She has had many life changes. Moved from [place] last year to be close to family. Currently living with her daughter and enjoying being close to her family. Having trouble adjusting after retiring, moving and TBI last year. She denies SI or HI. Tried in the past: celexa and effexor.   Essential hypertension/HLD/morbid obesity/h/o TIA: Pt reports compliance with amlodipine 10 mg QD. Blood pressures ranges at home are not routinely checked. Patient denies chest pain, shortness of breath, dizziness or lower extremity edema.  Pt takes a  daily baby ASA. Pt is  prescribed statin. H/O TIA.  Diet: Low sodium Exercise: not as routinely, but trying to get back into it.  RF: HTN, HLD, Obesity, h/o CVA, FHX MI  GERD/PPI:  Patient reports compliance with Nexium and Pepcid.  She would like refills today.  She feels the medications work well for her.  Hypothyroidism:  12.5 mcg levothyroxine daily.   Patient reports compliance.  Last thyroid panel completed 04/2019.  Overactive bladder/MS-white matter disease/TIA/TBI: Patient reports she would like to have her Myrbetriq prescribed by this office.  She was getting the medication prescribed by neurology in the past.  She would like this medication called into her mail-in pharmacy.  Patient reports the 50 mg tablet is working well for her. Patient was established with neurology.  However today she states she would like to be referred to a new neurologist if possible.  She asked for refills on her Aricept and dalfampridine.  Explained to patient I be happy to refill her Aricept however dalfampridine is a medication that is prescribed by neurology.  Depression screen [facility] 2/9 07/17/2019 05/07/2019 08/28/2018 02/17/2018 01/16/2018  Decreased Interest 0 0 0 0 2  Down, Depressed, Hopeless 0 0 0 0 2  PHQ - 2 Score 0 0 0 0 4  Altered sleeping - - 0 0 2  Tired, decreased energy - - 0 0 -  Change in appetite - - 0 0 1  Feeling bad or failure about yourself  - - 0 0 2  Trouble concentrating - - 2 0 2  Moving slowly or fidgety/restless - - 0 0 2  Suicidal thoughts - - 0 0 0  PHQ-9 Score - - 2 0 13  Difficult doing work/chores - - Somewhat difficult - -   GAD 7 : Generalized Anxiety Score 05/07/2019  08/28/2018 01/16/2018  Nervous, Anxious, on Edge 0 2 1  Control/stop worrying 0 2 2  Worry too much - different things 0 2 2  Trouble relaxing 0 0 2  Restless 0 0 2  Easily annoyed or irritable 0 2 2  Afraid - awful might happen 0 0 2  Total GAD 7 Score 0 8 13  Anxiety Difficulty - Somewhat difficult -       Fall Risk  07/17/2019 05/07/2019 08/28/2018 01/16/2018  Falls in the past year? 0 0 0 Yes  Comment - - - Jan 2016  Number falls in past yr: 0 - 0 1  Injury with Fall? 0 - 0 Yes  Risk for fall due to : - - Impaired balance/gait;Mental status change History of fall(s)  Follow up Falls prevention discussed Falls evaluation completed Falls evaluation  completed;Falls prevention discussed Follow up appointment     Immunization History  Administered Date(s) Administered  . Influenza, High Dose Seasonal PF 08/28/2018, 06/13/2019  . Pneumococcal Polysaccharide-23 08/28/2018  . Zoster Recombinat (Shingrix) 05/10/2019, 09/10/2019    No exam data present  Past Medical History:  Diagnosis Date  . Acid reflux   . Brain injury ([place]) 2015   Due to a fall   . 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 Chicken pox   . Constipation   . Depression   . Frequent headaches   . GERD (gastroesophageal reflux disease)   . 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 Hyperlipidemia   . Hypertension   . MS (multiple sclerosis) ([place]) 12/15/2016   possible dx. many MRI w/ positive white matter abnormality. MRI reported stable since 2015. mild progressive  Cognitive loss  . Obstructive sleep apnea    on prior neuro notes 10/2016; no results.   . Osteoporosis    had been on reclast  . Post concussion syndrome 10/2013   ongoing issues with concentration, memory loss, lability; EEG completed and normal.   . TBI (traumatic brain injury) ([place]) 2014   fall at [facility], hit head off of floor. No bleed, "concussion". Seen neurology since for focus and word finding.   Marland Kitchen TIA (transient ischemic attack) 11/30/2016   MRI normal. No lateralization. Speech affected and weakness during a hypotensive episdoe.   . Urinary incontinence    Allergies  Allergen Reactions  . Bee Venom Anaphylaxis  . Amoxicillin Dermatitis    Yeast infection   Past Surgical History:  Procedure Laterality Date  . BACK SURGERY  02/2015   herniated disc L4/5/6  . BREAST BIOPSY  1988   biopsy  . COLONOSCOPY     [place] Around 2015  . ESOPHAGOGASTRODUODENOSCOPY  2016   [place]  . LAPAROSCOPIC HYSTERECTOMY     partial   . SHOULDER ARTHROSCOPY W/ LABRAL REPAIR Left 02/13/2016   left shoulder  . Study: cognitive eval  06/23/2016   mild  cog-linguistic impairment, difficulty with verbal fluency  . Study: EEG   04/12/2015   Studies: EEG completed and normal.   . Study: MRI  04/28/2016   Impression: numerous non-enhancing flair hyperintense lesions w/in the brain .Suspicious for demyelinating disease such as lyme or MS. Her Lyme test were negative.    Family History  Problem Relation Age of Onset  . Lung cancer Mother   . Alcohol abuse Father   . Throat cancer Father   . Lung cancer Father   . Depression Father   . Heart attack Father   .  Breast cancer Sister   . Alcohol abuse Brother   . Diabetes Brother   . Mental illness Sister   . Stroke Daughter   . Endometriosis Daughter   . Heart disease Son   . Breast cancer Maternal Aunt   . Diabetes Maternal Grandmother   . GER disease Daughter   . Esophageal cancer Paternal Grandfather   . Colon cancer Neg Hx   . Rectal cancer Neg Hx   . Stomach cancer Neg Hx    Social History   Socioeconomic History  . Marital status: Single    Spouse name: Not on file  . Number of children: Not on file  . Years of education: Not on file  . Highest education level: Not on file  Occupational History  . Occupation: retired  Tobacco Use  . Smoking status: Former Smoker    Packs/day: 1.00    Years: 19.00    Pack years: 19.00    Quit date: 2012    Years since quitting: 8.9  . Smokeless tobacco: Never Used  . Tobacco comment: quit 6 years ago  Substance and Sexual Activity  . Alcohol use: Yes    Alcohol/week: 1.0 standard drinks    Types: 1 Glasses of wine per week    Comment: twice a month  . Drug use: Never  . Sexual activity: Not Currently    Partners: Male  Other Topics Concern  . Not on file  Social History Narrative   Single. Lived in [place] area, moved to [place] to be close to her daughters 2018. Lives with her daughter.   Some college, worked as Programmer, systems. Retired.    Social drinker. Former smoker.   Exercises routinely.    Wears dentures.    Drinks caffeine,  takes a daily vitamin.   Smoke alarm in the home. Wears her seatbelt.    Feels safe in her relationships.    Social Determinants of Health   Financial Resource Strain:   . Difficulty of Paying Living Expenses: Not on file  Food Insecurity:   . Worried About Charity fundraiser in the Last Year: Not on file  . Ran Out of Food in the Last Year: Not on file  Transportation Needs:   . Lack of Transportation (Medical): Not on file  . Lack of Transportation (Non-Medical): Not on file  Physical Activity:   . Days of Exercise per Week: Not on file  . Minutes of Exercise per Session: Not on file  Stress:   . Feeling of Stress : Not on file  Social Connections:   . Frequency of Communication with Friends and Family: Not on file  . Frequency of Social Gatherings with Friends and Family: Not on file  . Attends Religious Services: Not on file  . Active Member of Clubs or Organizations: Not on file  . Attends Archivist Meetings: Not on file  . Marital Status: Not on file  Intimate Partner Violence:   . Fear of Current or Ex-Partner: Not on file  . Emotionally Abused: Not on file  . Physically Abused: Not on file  . Sexually Abused: Not on file   Allergies as of 10/08/2019      Reactions   Bee Venom Anaphylaxis   Amoxicillin Dermatitis   Yeast infection      Medication List       Accurate as of October 08, 2019 11:59 PM. If you have any questions, ask your nurse or doctor.  amLODipine 10 MG tablet Commonly known as: NORVASC Take 1 tablet (10 mg total) by mouth daily.   aspirin EC 81 MG tablet Take 81 mg by mouth daily.   atorvastatin 40 MG tablet Commonly known as: LIPITOR Take 1 tablet (40 mg total) by mouth daily.   dalfampridine 10 MG Tb12 Take 10 mg by mouth 2 (two) times daily.   donepezil 5 MG tablet Commonly known as: ARICEPT Take 1 tablet (5 mg total) by mouth at bedtime.   EPINEPHrine 0.3 mg/0.3 mL Soaj injection Commonly known as: EPI-PEN  Inject 0.3 mLs (0.3 mg total) into the skin as needed.   esomeprazole 40 MG capsule Commonly known as: NEXIUM Take 1 capsule (40 mg total) by mouth daily at 12 noon.   famotidine 20 MG tablet Commonly known as: PEPCID Take 1 tablet (20 mg total) by mouth 2 (two) times daily.   FLUoxetine 40 MG capsule Commonly known as: PROZAC Take 1 capsule (40 mg total) by mouth daily.   ibuprofen 100 MG tablet Commonly known as: ADVIL Take 100 mg by mouth every 6 (six) hours as needed for fever. Taking 1x/day usually in morning   levothyroxine 25 MCG tablet Commonly known as: SYNTHROID Take 0.5 tablets (12.5 mcg total) by mouth daily before breakfast.   mirabegron ER 50 MG Tb24 tablet Commonly known as: MYRBETRIQ Take 1 tablet (50 mg total) by mouth daily. What changed: medication strength Changed by: Howard Pouch, DO   omega-3 acid ethyl esters 1 g capsule Commonly known as: LOVAZA Take by mouth 2 (two) times daily.   TURMERIC PO Take by mouth.       All past medical history, surgical history, allergies, family history, immunizations andmedications were updated in the EMR today and reviewed under the history and medication portions of their EMR.     Recent Results (from the past 2160 hour(s))  CBC     Status: None   Collection Time: 10/08/19  8:59 AM  Result Value Ref Range   WBC 5.4 4.0 - 10.5 K/uL   RBC 4.78 3.87 - 5.11 Mil/uL   Platelets 215.0 150.0 - 400.0 K/uL   Hemoglobin 13.8 12.0 - 15.0 g/dL   HCT 41.9 36.0 - 46.0 %   MCV 87.7 78.0 - 100.0 fl   MCHC 32.8 30.0 - 36.0 g/dL   RDW 13.0 11.5 - [id number] %  Basic Metabolic Panel (BMET)     Status: None   Collection Time: 10/08/19  8:59 AM  Result Value Ref Range   Sodium 137 135 - 145 mEq/L   Potassium 4.6 3.5 - 5.1 mEq/L   Chloride 105 96 - 112 mEq/L   CO2 26 19 - 32 mEq/L   Glucose, Bld 97 70 - 99 mg/dL   BUN 10 6 - 23 mg/dL   Creatinine, Ser 0.87 0.40 - 1.20 mg/dL   GFR 64.56 >60.00 mL/min   Calcium 9.8 8.4 - 10.5  mg/dL  Lipid panel     Status: None   Collection Time: 10/08/19  8:59 AM  Result Value Ref Range   Cholesterol 174 0 - 200 mg/dL    Comment: ATP III Classification       Desirable:  < 200 mg/dL               Borderline High:  200 - 239 mg/dL          High:  > = 240 mg/dL   Triglycerides 48.0 0.0 - 149.0 mg/dL    Comment:  Normal:  <150 mg/dLBorderline High:  150 - 199 mg/dL   HDL 87.30 >39.00 mg/dL   VLDL 9.6 0.0 - 40.0 mg/dL   LDL Cholesterol 78 0 - 99 mg/dL   Total CHOL/HDL Ratio 2     Comment:                Men          Women1/2 Average Risk     3.4          3.3Average Risk          5.0          4.42X Average Risk          9.6          7.13X Average Risk          15.0          11.0                       NonHDL 87.15     Comment: NOTE:  Non-HDL goal should be 30 mg/dL higher than patient's LDL goal (i.e. LDL goal of < 70 mg/dL, would have non-HDL goal of < 100 mg/dL)    Patient was never admitted.   ROS: 14 pt review of systems performed and negative (unless mentioned in an HPI)  Objective: BP 126/80 (BP Location: Right Arm, Patient Position: Sitting, Cuff Size: Normal)   Pulse 61   Temp 97.6 F (36.4 C) (Temporal)   Resp 17   Ht 5\' 2"  (1.575 m)   Wt 172 lb 6 oz (78.2 kg)   LMP 10/18/1990   SpO2 98%   BMI 31.53 kg/m  Gen: Afebrile. No acute distress.  Nontoxic in presentation, pleasant Caucasian female. HENT: AT. [place].  Eyes:Pupils Equal Round Reactive to light, Extraocular movements intact,  Conjunctiva without redness, discharge or icterus. Neck/lymp/endocrine: Supple, no lymphadenopathy, no thyromegaly CV: RRR , no edema, +2/4 P posterior tibialis pulses Chest: CTAB, no wheeze or crackles Skin: No rashes, purpura or petechiae.  Neuro:  Normal gait. PERLA. EOMi. Alert. Oriented x3 Psych: Normal affect, dress and demeanor. Normal speech. Normal thought content and judgment..    Assessment/plan: Shylo Shanes is a 68 y.o. female present for establishment.  Depression  with anxiety -Doing very well on Prozac 40 mg daily.  Refilled for her today to desired pharmacy of [place] in the past: celexa and effexor.  -Follow-up in 6 months long as doing well, sooner if needed  Essential hypertension/morbid obesity/HLD -Blood pressure stable today.  Refills provided on amlodipine 10 mg daily to [facility]. -Continue atorvastatin 40 mg daily, refills called into [facility]. - continue statin and ASA F/u 6 mos  Traumatic brain injury with loss of consciousness, sequela (HCC)/White matter disease/TIA/overactive bladder -Agreed to refill her Aricept 5 mg--called into [facility]. -Agreed to refill her Myrbetriq 50 mg daily--refills called into her mail-in pharmacy. -Patient desired referral to new neurologist for evaluation, this was completed for her today. She worked with physical therapy and did really well with improving her gait.  Encounter for monitoring long-term proton pump inhibitor therapy/GERD - stable on pepcid and nexium refills called into [facility] for her.    Return in about 5 months (around 03/18/2020).  Note is dictated utilizing voice recognition software. Although note has been proof read prior to signing, occasional typographical errors still can be missed. If any questions arise, please do not hesitate to call for verification.  Electronically signed by:  Howard Pouch, DO [place]

## 2019-10-08 NOTE — Patient Instructions (Signed)
Please call back today with Neurology doctors name and Aricept dose.   We will call you with labs.   We will cancel the 1/6 appt. Follow up in 5.5 months.

## 2019-10-08 NOTE — Telephone Encounter (Signed)
Patient called and left VM on nurses line stating she was told to call when she got home to let Dr Raoul Pitch what she was taking. Pt states she is taking Aricept 5mg  tablet at night, gets this from [facility] [employer]. She also said her daughter does not see a Neurologist and to just pick anyone she recommends.

## 2019-10-08 NOTE — Telephone Encounter (Signed)
noted 

## 2019-10-09 ENCOUNTER — Telehealth: Payer: Self-pay | Admitting: Family Medicine

## 2019-10-09 ENCOUNTER — Encounter: Payer: Self-pay | Admitting: Family Medicine

## 2019-10-09 MED ORDER — ESOMEPRAZOLE MAGNESIUM 40 MG PO CPDR: 40.0000 mg | DELAYED_RELEASE_CAPSULE | Freq: Every day | ORAL | 1 refills | Status: DC

## 2019-10-09 MED ORDER — FLUOXETINE HCL 40 MG PO CAPS: 40.0000 mg | ORAL_CAPSULE | Freq: Every day | ORAL | 1 refills | Status: DC

## 2019-10-09 MED ORDER — AMLODIPINE BESYLATE 10 MG PO TABS: 10.0000 mg | ORAL_TABLET | Freq: Every day | ORAL | 1 refills | Status: DC

## 2019-10-09 MED ORDER — DONEPEZIL HCL 5 MG PO TABS: 5.0000 mg | ORAL_TABLET | Freq: Every day | ORAL | 1 refills | Status: DC

## 2019-10-09 MED ORDER — FAMOTIDINE 20 MG PO TABS: 20.0000 mg | ORAL_TABLET | Freq: Two times a day (BID) | ORAL | 3 refills | Status: DC

## 2019-10-09 MED ORDER — LEVOTHYROXINE SODIUM 25 MCG PO TABS: 12.5000 ug | ORAL_TABLET | Freq: Every day | ORAL | 3 refills | Status: DC

## 2019-10-09 MED ORDER — ATORVASTATIN CALCIUM 40 MG PO TABS: 40.0000 mg | ORAL_TABLET | Freq: Every day | ORAL | 3 refills | Status: DC

## 2019-10-09 NOTE — Telephone Encounter (Signed)
Please call her local [facility] pharmacy listed and cancel any remainder prescription refills she has on all her medications.  Patient has transferred her prescriptions to another pharmacy.  I have already sent the refills to her other pharmacy.

## 2019-10-09 NOTE — Telephone Encounter (Signed)
Pharmacy was called and all RX's were cancelled and refills

## 2019-10-17 ENCOUNTER — Ambulatory Visit (INDEPENDENT_AMBULATORY_CARE_PROVIDER_SITE_OTHER): Payer: Medicare Other | Admitting: Gastroenterology

## 2019-10-17 ENCOUNTER — Other Ambulatory Visit: Payer: Self-pay

## 2019-10-17 ENCOUNTER — Encounter: Payer: Self-pay | Admitting: Gastroenterology

## 2019-10-17 ENCOUNTER — Telehealth: Payer: Self-pay | Admitting: *Deleted

## 2019-10-17 VITALS — BP 144/84 | HR 66 | Temp 98.0°F | Ht 62.0 in | Wt 174.1 lb

## 2019-10-17 DIAGNOSIS — K449 Diaphragmatic hernia without obstruction or gangrene: Secondary | ICD-10-CM

## 2019-10-17 DIAGNOSIS — Z86010 Personal history of colonic polyps: Secondary | ICD-10-CM | POA: Diagnosis not present

## 2019-10-17 DIAGNOSIS — K219 Gastro-esophageal reflux disease without esophagitis: Secondary | ICD-10-CM | POA: Diagnosis not present

## 2019-10-17 DIAGNOSIS — Z8719 Personal history of other diseases of the digestive system: Secondary | ICD-10-CM | POA: Diagnosis not present

## 2019-10-17 NOTE — Telephone Encounter (Signed)
LVM informing patient her appointment next Monday is canceled due to MD out of the office. I advised she'll get a call  next week to reschedule. I advised office is closed tomorrow and Friday, left #.

## 2019-10-17 NOTE — Patient Instructions (Signed)
Please return to office in 1 year  It was a pleasure to see you today!  Vito Cirigliano, D.O.

## 2019-10-17 NOTE — Progress Notes (Signed)
P  Chief Complaint:    GERD, history of Barrett's esophagus, colon polyps, procedure follow-up  GI History: Abigail Michael a 68 y.o.femalewith a history of MS, hypertension, hyperlipidemia, TIA,TBI (w/ memory loss), follows in the GI clinic for GERD.  Longstanding history of reflux symptoms (pyrosis, regurgitation, dyspepsia) but with worsening symptoms in 2020.Had been previously treated in [place] and moved to [place] 2 years ago.  Reflux well controlled with Nexium 40 mg daily and Pepcid 20 mg twice daily.Breakthrough with any missed dose or if single agent. Previously treated with Dexilant and twice daily Zantac.   No dysphagia.Reported history of Barrett's Esophagus on EGD in 2016 (no biopsy report available for review).No subsequent EGD.  Endoscopic history: -EGD (01/20/2015,Dr. Farivar, [place]):3-4 cmHiatal hernia, incompetent LES,short segmentBarrett's esophagus. Normal stomach and duodenum. Biopsies taken but not available for review. -EGD (08/2019, Dr. Bryan Lemma): 5 cm HH, 3 cm segment of salmon-colored mucosa, but biopsies negative for intestinal metaplasia -Colonoscopy (08/2019, Dr. Bryan Lemma): 4 subcentimeter tubular adenomas, left-sided diverticulosis, internal hemorrhoids.  Repeat in 3 years  HPI:    Patient is a 68 y.o. female presenting to the Gastroenterology Clinic for follow-up.  Last seen by me in 07/2019.  Was doing well at that time, with reflux well controlled with Pepcid/Nexium without any breakthrough symptoms.  Has since completed EGD for Barrett's surveillance along with colonoscopy for ongoing polyp surveillance as outlined above.  Today, she states she otherwise feels well.  Reflux still well controlled Nexium/Pepcid.  Tolerating her p.o. intake without any issues.  No complaints today.  Normal CBC and BMP earlier this month.   Review of systems:     No chest pain, no SOB, no fevers, no urinary sx   Past Medical History:  Diagnosis Date  .  Acid reflux   . Brain injury ([place]) 2015   Due to a fall   . 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 Chicken pox   . Constipation   . Depression   . Frequent headaches   . GERD (gastroesophageal reflux disease)   . 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 Hyperlipidemia   . Hypertension   . MS (multiple sclerosis) ([place]) 12/15/2016   possible dx. many MRI w/ positive white matter abnormality. MRI reported stable since 2015. mild progressive  Cognitive loss  . Obstructive sleep apnea    on prior neuro notes 10/2016; no results.   . Osteoporosis    had been on reclast  . Post concussion syndrome 10/2013   ongoing issues with concentration, memory loss, lability; EEG completed and normal.   . TBI (traumatic brain injury) ([place]) 2014   fall at [facility], hit head off of floor. No bleed, "concussion". Seen neurology since for focus and word finding.   Marland Kitchen TIA (transient ischemic attack) 11/30/2016   MRI normal. No lateralization. Speech affected and weakness during a hypotensive episdoe.   . Urinary incontinence     Patient's surgical history, family medical history, social history, medications and allergies were all reviewed in Epic    Current Outpatient Medications  Medication Sig Dispense Refill  . AMBULATORY NON FORMULARY MEDICATION 4 tablets 3 (three) times daily. X Factor start up-all natural product for inflammation    . amLODipine (NORVASC) 10 MG tablet Take 1 tablet (10 mg total) by mouth daily. 90 tablet 1  . aspirin EC 81 MG tablet Take 81 mg by mouth daily.    Marland Kitchen atorvastatin (LIPITOR)  40 MG tablet Take 1 tablet (40 mg total) by mouth daily. 90 tablet 3  . dalfampridine 10 MG TB12 Take 10 mg by mouth 2 (two) times daily.    Marland Kitchen donepezil (ARICEPT) 5 MG tablet Take 1 tablet (5 mg total) by mouth at bedtime. 90 tablet 1  . esomeprazole (NEXIUM) 40 MG capsule Take 1 capsule (40 mg total) by mouth daily at 12 noon. 90 capsule 1  . famotidine (PEPCID)  20 MG tablet Take 1 tablet (20 mg total) by mouth 2 (two) times daily. 180 tablet 3  . FLUoxetine (PROZAC) 40 MG capsule Take 1 capsule (40 mg total) by mouth daily. 90 capsule 1  . ibuprofen (ADVIL) 100 MG tablet Take 100 mg by mouth every 6 (six) hours as needed for fever. Taking 1x/day usually in morning    . levothyroxine (SYNTHROID) 25 MCG tablet Take 0.5 tablets (12.5 mcg total) by mouth daily before breakfast. 45 tablet 3  . mirabegron ER (MYRBETRIQ) 50 MG TB24 tablet Take 1 tablet (50 mg total) by mouth daily. 90 tablet 3  . TURMERIC PO Take by mouth.    . EPINEPHrine 0.3 mg/0.3 mL IJ SOAJ injection Inject 0.3 mLs (0.3 mg total) into the skin as needed. (Patient not taking: Reported on 10/17/2019) 1 each 1   No current facility-administered medications for this visit.    Physical Exam:     BP (!) 144/84   Pulse 66   Temp 98 F (36.7 C)   Ht 5\' 2"  (1.575 m)   Wt 174 lb 2 oz (79 kg)   LMP 10/18/1990   BMI 31.85 kg/m   GENERAL:  Pleasant female in NAD PSYCH: : Cooperative, normal affect EENT:  conjunctiva pink, mucous membranes moist, neck supple without masses ABDOMEN:  Nondistended, soft, nontender. No obvious masses, no hepatomegaly SKIN:  turgor, no lesions seen Musculoskeletal:  Normal muscle tone, normal strength NEURO: Alert and oriented x 3, no focal neurologic deficits   IMPRESSION and PLAN:     1) GERD 2) Hiatal hernia 3) History of Barrett's esophagus  -Reflux well-controlled on current therapy.  No change at this time from current Pepcid/Nexium -Resume antireflux lifestyle/dietary modifications -Recent EGD actually did not demonstrate any intestinal metaplasia.  Discussed those results at length today.  Given her large hernia, prior history of Barrett's, she would like to continue ongoing surveillance with repeat EGD in 3-5 years -Discussed the role of surgical intervention given large hiatal hernia.  She politely declines surgical referral  4) Colon  polyps -For subcentimeter tubular adenomas.  Repeat in 3 years -Repeat colonoscopy in 2023 for ongoing polyp surveillance  RTC in 12 months or sooner as needed          Lavena Bullion ,DO, FACG 10/17/2019, 11:16 AM

## 2019-10-22 ENCOUNTER — Ambulatory Visit: Payer: Medicare Other | Admitting: Neurology

## 2019-10-24 ENCOUNTER — Ambulatory Visit: Payer: Medicare Other | Admitting: Family Medicine

## 2019-10-29 ENCOUNTER — Ambulatory Visit: Payer: Medicare Other | Admitting: Neurology

## 2019-11-27 ENCOUNTER — Ambulatory Visit: Payer: Medicare Other

## 2019-11-29 ENCOUNTER — Ambulatory Visit: Payer: Self-pay

## 2019-12-19 ENCOUNTER — Other Ambulatory Visit: Payer: Self-pay

## 2019-12-19 ENCOUNTER — Encounter: Payer: Self-pay | Admitting: Neurology

## 2019-12-19 ENCOUNTER — Ambulatory Visit (INDEPENDENT_AMBULATORY_CARE_PROVIDER_SITE_OTHER): Payer: Medicare Other | Admitting: Neurology

## 2019-12-19 ENCOUNTER — Telehealth: Payer: Self-pay | Admitting: Neurology

## 2019-12-19 VITALS — BP 151/88 | HR 64 | Temp 97.7°F | Ht 62.0 in | Wt 174.0 lb

## 2019-12-19 DIAGNOSIS — R9082 White matter disease, unspecified: Secondary | ICD-10-CM

## 2019-12-19 DIAGNOSIS — G3281 Cerebellar ataxia in diseases classified elsewhere: Secondary | ICD-10-CM

## 2019-12-19 NOTE — Telephone Encounter (Signed)
Medicare/bcbs fed order sent to GI. No auth they will reach out to the patient to schedule.  °

## 2019-12-19 NOTE — Patient Instructions (Addendum)
Stop the Ampyra (dalfampridine).

## 2019-12-19 NOTE — Progress Notes (Signed)
Reason for visit: Possible multiple sclerosis  Referring physician: Dr. Sherie Don Michael is a 69 y.o. female  History of present illness:  [name] is a 69 year old right-handed white female with a history of hypertension and dyslipidemia.  She had a concussion in 2015, she has had some cognitive changes since that time that has been persistent.  The patient apparently moved from [place] to this area, prior to the move she was told that she may have multiple sclerosis.  She had MRI of the brain on 16 November 2016, the radiologist read out the study as showing white matter changes consistent with multiple sclerosis.  The patient however never had a lumbar puncture and was never placed on medical therapy.  Since moving to this area, she has been followed through [facility] MRI of the thoracic and cervical spine were done which were unremarkable for any spinal cord lesions.  The patient underwent lumbar puncture with negative oligoclonal banding.  The patient was told that she did not have multiple sclerosis.  She has however been placed on Ampyra for walking problems.  The patient has had a significant improvement in her gait instability and balance after taking physical therapy.  The patient does report some difficulty controlling the bladder.  She denies any numbness or weakness of the extremities.  She denies any vision changes or double vision.  She has no headaches or dizziness or difficulty with speech or swallowing.  She does report some troubles with memory.  She is sent to this office for an evaluation.  Past Medical History:  Diagnosis Date  . Acid reflux   . Brain injury ([place]) 2015   Due to a fall   . 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 Chicken pox   . Constipation   . Depression   . Frequent headaches   . GERD (gastroesophageal reflux disease)   . 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 Hyperlipidemia   . Hypertension     . MS (multiple sclerosis) ([place]) 12/15/2016   possible dx. many MRI w/ positive white matter abnormality. MRI reported stable since 2015. mild progressive  Cognitive loss  . Obstructive sleep apnea    on prior neuro notes 10/2016; no results.   . Osteoporosis    had been on reclast  . Post concussion syndrome 10/2013   ongoing issues with concentration, memory loss, lability; EEG completed and normal.   . TBI (traumatic brain injury) ([place]) 2014   fall at [facility], hit head off of floor. No bleed, "concussion". Seen neurology since for focus and word finding.   Marland Kitchen TIA (transient ischemic attack) 11/30/2016   MRI normal. No lateralization. Speech affected and weakness during a hypotensive episdoe.   . Urinary incontinence     Past Surgical History:  Procedure Laterality Date  . BACK SURGERY  02/2015   herniated disc L4/5/6  . BREAST BIOPSY  1988   biopsy  . COLONOSCOPY     [place]. Around 2015  . ESOPHAGOGASTRODUODENOSCOPY  2016   [place]  . LAPAROSCOPIC HYSTERECTOMY     partial   . SHOULDER ARTHROSCOPY W/ LABRAL REPAIR Left 02/13/2016   left shoulder  . Study: cognitive eval  06/23/2016   mild cog-linguistic impairment, difficulty with verbal fluency  . Study: EEG   04/12/2015   Studies: EEG completed and normal.   . Study: MRI  04/28/2016   Impression: numerous non-enhancing flair hyperintense lesions  w/in the brain .Suspicious for demyelinating disease such as lyme or MS. Her Lyme test were negative.     Family History  Problem Relation Age of Onset  . Lung cancer Mother   . Alcohol abuse Father   . Throat cancer Father   . Lung cancer Father   . Depression Father   . Heart attack Father   . Breast cancer Sister   . Alcohol abuse Brother   . Diabetes Brother   . Mental illness Sister   . Stroke Daughter   . Endometriosis Daughter   . Heart disease Son   . Breast cancer Maternal Aunt   . Diabetes Maternal Grandmother   . GER disease Daughter   .  Esophageal cancer Paternal Grandfather   . Colon cancer Neg Hx   . Rectal cancer Neg Hx   . Stomach cancer Neg Hx     Social history:  reports that she quit smoking about 9 years ago. She has a 19.00 pack-year smoking history. She has never used smokeless tobacco. She reports current alcohol use of about 1.0 standard drinks of alcohol per week. She reports that she does not use drugs.  Medications:  Prior to Admission medications   Medication Sig Start Date End Date Taking? Authorizing Provider  AMBULATORY NON FORMULARY MEDICATION 4 tablets 3 (three) times daily. X Factor start up-all natural product for inflammation   Yes [provider]  amLODipine (NORVASC) 10 MG tablet Take 1 tablet (10 mg total) by mouth daily. 10/09/19  Yes Kuneff, Renee A, DO  aspirin EC 81 MG tablet Take 81 mg by mouth daily.   Yes [provider]  atorvastatin (LIPITOR) 40 MG tablet Take 1 tablet (40 mg total) by mouth daily. 10/09/19  Yes Kuneff, Renee A, DO  dalfampridine 10 MG TB12 Take 10 mg by mouth 2 (two) times daily. 12/07/18  Yes Basuroski, Jacqulynn Cadet, MD  donepezil (ARICEPT) 5 MG tablet Take 1 tablet (5 mg total) by mouth at bedtime. 10/09/19  Yes Kuneff, Renee A, DO  EPINEPHrine 0.3 mg/0.3 mL IJ SOAJ injection Inject 0.3 mLs (0.3 mg total) into the skin as needed. 05/07/19  Yes Kuneff, Renee A, DO  esomeprazole (NEXIUM) 40 MG capsule Take 1 capsule (40 mg total) by mouth daily at 12 noon. 10/09/19  Yes Kuneff, Renee A, DO  famotidine (PEPCID) 20 MG tablet Take 1 tablet (20 mg total) by mouth 2 (two) times daily. 10/09/19  Yes Kuneff, Renee A, DO  ibuprofen (ADVIL) 100 MG tablet Take 100 mg by mouth every 6 (six) hours as needed for fever. Taking 1x/day usually in morning   Yes [provider]  levothyroxine (SYNTHROID) 25 MCG tablet Take 0.5 tablets (12.5 mcg total) by mouth daily before breakfast. 10/09/19  Yes Kuneff, Renee A, DO  mirabegron ER (MYRBETRIQ) 50 MG TB24 tablet  Take 1 tablet (50 mg total) by mouth daily. 10/08/19  Yes Kuneff, Renee A, DO  TURMERIC PO Take by mouth.   Yes [provider]      Allergies  Allergen Reactions  . Bee Venom Anaphylaxis  . Amoxicillin Dermatitis    Yeast infection    ROS:  Out of a complete 14 system review of symptoms, the patient complains only of the following symptoms, and all other reviewed systems are negative.  Walking difficulty Memory problems  Blood pressure (!) 151/88, pulse 64, temperature 97.7 F (36.5 C), height 5\' 2"  (1.575 m), weight 174 lb (78.9 kg), last menstrual period 10/18/1990.  Physical Exam  General: The patient is alert and cooperative at the time of the examination.  The patient is mildly to moderately obese.  Eyes: Pupils are equal, round, and reactive to light. Discs are flat bilaterally.  Neck: The neck is supple, no carotid bruits are noted.  Respiratory: The respiratory examination is clear.  Cardiovascular: The cardiovascular examination reveals a regular rate and rhythm, no obvious murmurs or rubs are noted.  Skin: Extremities are without significant edema.  Neurologic Exam  Mental status: The patient is alert and oriented x 3 at the time of the examination. The patient has apparent normal recent and remote memory, with an apparently normal attention span and concentration ability.  Mini-Mental status examination done today shows a total score 30/30.  The patient is able to name 7 four-legged animals in 60 seconds.  Cranial nerves: Facial symmetry is present. There is good sensation of the face to pinprick and soft touch bilaterally. The strength of the facial muscles and the muscles to head turning and shoulder shrug are normal bilaterally. Speech is well enunciated, no aphasia or dysarthria is noted. Extraocular movements are full. Visual fields are full. The tongue is midline, and the patient has symmetric elevation of the soft palate. No obvious hearing deficits  are noted.  Motor: The motor testing reveals 5 over 5 strength of all 4 extremities. Good symmetric motor tone is noted throughout.  Sensory: Sensory testing is intact to pinprick, soft touch, vibration sensation, and position sense on all 4 extremities. No evidence of extinction is noted.  Coordination: Cerebellar testing reveals good finger-nose-finger and heel-to-shin bilaterally.  Gait and station: Gait is normal. Tandem gait is slightly unsteady.  Romberg is negative. No drift is seen.  Reflexes: Deep tendon reflexes are symmetric and normal bilaterally. Toes are downgoing bilaterally.   Assessment/Plan:  1.  History of cerebrovascular disease, TIA  2.  Abnormal MRI brain  The patient likely does not have multiple sclerosis.  She has risk factors for cerebrovascular disease, she has had a clinical TIA in the past associated with speech alteration and bilateral arm weakness.  This event occurred on 30 November 2016.  The patient is on aspirin currently.  Clinical examination does not show spasticity of the lower extremities, there is no evidence of a significant gait disorder.  The ongoing use of Ampyra therefore is likely not indicated.  We will stop the medication.  The patient will follow up in 6 months, we will repeat MRI of the brain.  Jill Alexanders MD 12/19/2019 9:27 AM  [facility] [address] [place], [place] [address]  Phone [phone] Fax [phone]

## 2020-01-12 ENCOUNTER — Ambulatory Visit
Admission: RE | Admit: 2020-01-12 | Discharge: 2020-01-12 | Disposition: A | Discharge status: Home / Self Care | Payer: Medicare Other | Source: Ambulatory Visit | Attending: Neurology | Admitting: Neurology

## 2020-01-12 ENCOUNTER — Other Ambulatory Visit: Payer: Self-pay

## 2020-01-12 DIAGNOSIS — G3281 Cerebellar ataxia in diseases classified elsewhere: Secondary | ICD-10-CM

## 2020-01-12 MED ORDER — GADOBENATE DIMEGLUMINE 529 MG/ML IV SOLN
16.0000 mL | Freq: Once | INTRAVENOUS | Status: AC | PRN
  Administered 2020-01-12: 16 mL via INTRAVENOUS

## 2020-01-13 ENCOUNTER — Telehealth: Payer: Self-pay | Admitting: Neurology

## 2020-01-13 NOTE — Telephone Encounter (Signed)
I called the patient.  MRI of the brain shows mild nonspecific white matter changes, given the fact that spinal cord evaluation and spinal fluid evaluation were unremarkable, the patient likely does not have a demyelinating disease.   MRI brain 01/12/20:  IMPRESSION: This MRI of the brain with and without contrast shows the following: 1.   Scattered T2/FLAIR hyperintense foci in the right cerebellar hemisphere and in the periventricular, subcortical and deep white matter of both hemispheres.  The pattern is nonspecific but could be seen with chronic microvascular ischemic change or demyelination.  None of the foci enhance or appear to be acute a report from 2015 describes a similar pattern, but actual films were not available for comparison. 2.   There is a normal enhancement pattern and no acute findings.

## 2020-01-23 ENCOUNTER — Other Ambulatory Visit: Payer: Self-pay

## 2020-01-23 ENCOUNTER — Ambulatory Visit (INDEPENDENT_AMBULATORY_CARE_PROVIDER_SITE_OTHER): Payer: Medicare Other | Admitting: Family Medicine

## 2020-01-23 ENCOUNTER — Encounter: Payer: Self-pay | Admitting: Family Medicine

## 2020-01-23 VITALS — BP 138/88 | HR 70 | Temp 97.6°F | Resp 17 | Ht 62.0 in | Wt 174.4 lb

## 2020-01-23 DIAGNOSIS — I69311 Memory deficit following cerebral infarction: Secondary | ICD-10-CM | POA: Insufficient documentation

## 2020-01-23 DIAGNOSIS — S069X9S Unspecified intracranial injury with loss of consciousness of unspecified duration, sequela: Secondary | ICD-10-CM | POA: Diagnosis not present

## 2020-01-23 DIAGNOSIS — F418 Other specified anxiety disorders: Secondary | ICD-10-CM | POA: Diagnosis not present

## 2020-01-23 DIAGNOSIS — G3184 Mild cognitive impairment, so stated: Secondary | ICD-10-CM | POA: Insufficient documentation

## 2020-01-23 MED ORDER — BUPROPION HCL ER (SR) 150 MG PO TB12: ORAL_TABLET | ORAL | 1 refills | Status: DC

## 2020-01-23 NOTE — Progress Notes (Signed)
Patient ID: Abigail Michael, female  DOB: January 22, 1951, 69 y.o.   MRN: [id number] Patient Care Team    Relationship Specialty Notifications Start End  Ma Hillock, DO PCP - General Family Medicine  01/16/18   Sharlene Dory, MD Referring Physician Neurology  08/28/18   Tommi Rumps  Dentistry  07/17/19   [name]  Orthodontics  07/17/19   Feliberto Harts R (Inactive)  Oral Surgery  07/17/19   Delanna Ahmadi, MD Referring Physician Psychiatry  07/17/19    Comment: Neurology    Chief Complaint  Patient presents with  . Referral    Pt would like referral to Therapist/Psych. Pt states she is getting angry at times and would like to work through issues. Would like older female.     Subjective:  Abigail Michael is a 69 y.o.  female present for follow up Depression with anxiety Patient presents today to discuss her depression anxiety.  She reports she would like a referral to a therapist or psychologist to discuss her worsening anger.  She is prescribed Prozac 40 mg daily and has been doing well on this medication for a few years.  She states over the last few weeks she has noted she feels more lonely.  She is struggling working through the her feelings of "abandonment "and relocation/move to the [place] area.  She reports even though she is the one that had left [place] she still feels hurt and abandoned by the circumstances.  She has noted she has become more angry and irritable.  Her daughter told her she threw a tantrum the other day and she agrees she let her emotions get out of hand.  She reports she has been drinking more alcohol, which is not like her.  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  She is having more difficulty with attention and focus and this makes her upset.  When her emotions start to overwhelm her she tries to get out of the house and take a walk or work in the yard.  She has also tried to reach out  and join social groups and most recently went bowling.  The past 6 months she has been in a relationship with a man that she has known for years and had been her friend prior.  She has enjoyed this relationship and had reported happiness with the relationship.  Today she endorses feeling guilty  because she knows this relationship is not what she needs and is not working because of the long distance between them.  She has had a few therapy sessions with Dr. [name] in the past.  She really liked Dr. [name] but did not feel a connection with her.  She would like a referral to a different therapist today. Prior note:  Has been on prozac for a little over a year after CVA. She reports she feels her depression and anxiety are worsening. She has had many life changes. Moved from [place] last year to be close to family. Currently living with her daughter and enjoying being close to her family. Having trouble adjusting after retiring, moving and TBI last year. She denies SI or HI. Tried in the past: celexa and effexor.     Depression screen [facility] 2/9 01/23/2020 07/17/2019 05/07/2019 08/28/2018 02/17/2018  Decreased Interest 1 0 0 0 0  Down, Depressed, Hopeless 1 0 0 0 0  PHQ - 2 Score 2 0 0 0  0  Altered sleeping 0 - - 0 0  Tired, decreased energy 3 - - 0 0  Change in appetite 3 - - 0 0  Feeling bad or failure about yourself  2 - - 0 0  Trouble concentrating 1 - - 2 0  Moving slowly or fidgety/restless 0 - - 0 0  Suicidal thoughts 1 - - 0 0  PHQ-9 Score 12 - - 2 0  Difficult doing work/chores Not difficult at all - - Somewhat difficult -   GAD 7 : Generalized Anxiety Score 01/23/2020 05/07/2019 08/28/2018 01/16/2018  Nervous, Anxious, on Edge 3 0 2 1  Control/stop worrying 0 0 2 2  Worry too much - different things 1 0 2 2  Trouble relaxing 3 0 0 2  Restless 0 0 0 2  Easily annoyed or irritable 3 0 2 2  Afraid - awful might happen 0 0 0 2  Total GAD 7 Score 10 0 8 13  Anxiety Difficulty Not difficult at  all - Somewhat difficult -       Fall Risk  07/17/2019 05/07/2019 08/28/2018 01/16/2018  Falls in the past year? 0 0 0 Yes  Comment - - - Jan 2016  Number falls in past yr: 0 - 0 1  Injury with Fall? 0 - 0 Yes  Risk for fall due to : - - Impaired balance/gait;Mental status change History of fall(s)  Follow up Falls prevention discussed Falls evaluation completed Falls evaluation completed;Falls prevention discussed Follow up appointment     Immunization History  Administered Date(s) Administered  . Influenza, High Dose Seasonal PF 08/28/2018, 06/13/2019  . PFIZER SARS-COV-2 Vaccination 11/09/2019, 12/03/2019  . Pneumococcal Polysaccharide-23 08/28/2018  . Zoster Recombinat (Shingrix) 05/10/2019, 09/10/2019    No exam data present  Past Medical History:  Diagnosis Date  . Acid reflux   . Brain injury ([place]) 2015   Due to a fall   . 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 Chicken pox   . Constipation   . Depression   . Frequent headaches   . GERD (gastroesophageal reflux disease)   . 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 Hyperlipidemia   . Hypertension   . MS (multiple sclerosis) ([place]) 12/15/2016   possible dx. many MRI w/ positive white matter abnormality. MRI reported stable since 2015. mild progressive  Cognitive loss  . Obstructive sleep apnea    on prior neuro notes 10/2016; no results.   . Osteoporosis    had been on reclast  . Post concussion syndrome 10/2013   ongoing issues with concentration, memory loss, lability; EEG completed and normal.   . TBI (traumatic brain injury) ([place]) 2014   fall at [facility], hit head off of floor. No bleed, "concussion". Seen neurology since for focus and word finding.   Marland Kitchen TIA (transient ischemic attack) 11/30/2016   MRI normal. No lateralization. Speech affected and weakness during a hypotensive episdoe.   . Urinary incontinence    Allergies  Allergen Reactions  . Bee Venom Anaphylaxis  .  Amoxicillin Dermatitis    Yeast infection   Past Surgical History:  Procedure Laterality Date  . BACK SURGERY  02/2015   herniated disc L4/5/6  . BREAST BIOPSY  1988   biopsy  . COLONOSCOPY     [place]. Around 2015  . ESOPHAGOGASTRODUODENOSCOPY  2016   [place]  . LAPAROSCOPIC HYSTERECTOMY     partial   . SHOULDER ARTHROSCOPY  W/ LABRAL REPAIR Left 02/13/2016   left shoulder  . Study: cognitive eval  06/23/2016   mild cog-linguistic impairment, difficulty with verbal fluency  . Study: EEG   04/12/2015   Studies: EEG completed and normal.   . Study: MRI  04/28/2016   Impression: numerous non-enhancing flair hyperintense lesions w/in the brain .Suspicious for demyelinating disease such as lyme or MS. Her Lyme test were negative.    Family History  Problem Relation Age of Onset  . Lung cancer Mother   . Alcohol abuse Father   . Throat cancer Father   . Lung cancer Father   . Depression Father   . Heart attack Father   . Breast cancer Sister   . Alcohol abuse Brother   . Diabetes Brother   . Mental illness Sister   . Stroke Daughter   . Endometriosis Daughter   . Heart disease Son   . Breast cancer Maternal Aunt   . Diabetes Maternal Grandmother   . GER disease Daughter   . Esophageal cancer Paternal Grandfather   . Colon cancer Neg Hx   . Rectal cancer Neg Hx   . Stomach cancer Neg Hx    Social History   Socioeconomic History  . Marital status: Single    Spouse name: Not on file  . Number of children: 4  . Years of education: two years of college  . Highest education level: Not on file  Occupational History  . Occupation: retired  Tobacco Use  . Smoking status: Former Smoker    Packs/day: 1.00    Years: 19.00    Pack years: 19.00    Quit date: 2012    Years since quitting: 9.2  . Smokeless tobacco: Never Used  . Tobacco comment: quit 6 years ago  Substance and Sexual Activity  . Alcohol use: Yes    Alcohol/week: 1.0 standard drinks    Types:  1 Glasses of wine per week    Comment: twice a month  . Drug use: Never  . Sexual activity: Not Currently    Partners: Male  Other Topics Concern  . Not on file  Social History Narrative   Single. Lived in [place] area, moved to [place] to be close to her daughters 2018. Lives with her daughter.   Some college, worked as Programmer, systems. Retired.    Social drinker. Former smoker.   Exercises routinely.    Wears dentures.    Drinks caffeine, takes a daily vitamin.   Smoke alarm in the home. Wears her seatbelt.    Feels safe in her relationships.   3-4 cups per day.   Four children (2 biological, 2 she raised as her own).   Social Determinants of Health   Financial Resource Strain:   . Difficulty of Paying Living Expenses:   Food Insecurity:   . Worried About Charity fundraiser in the Last Year:   . Arboriculturist in the Last Year:   Transportation Needs:   . Film/video editor (Medical):   Marland Kitchen Lack of Transportation (Non-Medical):   Physical Activity:   . Days of Exercise per Week:   . Minutes of Exercise per Session:   Stress:   . Feeling of Stress :   Social Connections:   . Frequency of Communication with Friends and Family:   . Frequency of Social Gatherings with Friends and Family:   . Attends Religious Services:   . Active Member of Clubs or Organizations:   . Attends  Club or Organization Meetings:   Marland Kitchen Marital Status:   Intimate Partner Violence:   . Fear of Current or Ex-Partner:   . Emotionally Abused:   Marland Kitchen Physically Abused:   . Sexually Abused:    Allergies as of 01/23/2020      Reactions   Bee Venom Anaphylaxis   Amoxicillin Dermatitis   Yeast infection      Medication List       Accurate as of January 23, 2020  2:13 PM. If you have any questions, ask your nurse or doctor.        AMBULATORY NON FORMULARY MEDICATION 4 tablets 3 (three) times daily. X Factor start up-all natural product for inflammation   amLODipine 10 MG tablet Commonly known as:  NORVASC Take 1 tablet (10 mg total) by mouth daily.   aspirin EC 81 MG tablet Take 81 mg by mouth daily.   atorvastatin 40 MG tablet Commonly known as: LIPITOR Take 1 tablet (40 mg total) by mouth daily.   buPROPion 150 MG 12 hr tablet Commonly known as: Wellbutrin SR 1 tab in the morning for 3 days, then 1 tab every 12 hours. Started by: Howard Pouch, DO   donepezil 5 MG tablet Commonly known as: ARICEPT Take 1 tablet (5 mg total) by mouth at bedtime.   EPINEPHrine 0.3 mg/0.3 mL Soaj injection Commonly known as: EPI-PEN Inject 0.3 mLs (0.3 mg total) into the skin as needed.   esomeprazole 40 MG capsule Commonly known as: NEXIUM Take 1 capsule (40 mg total) by mouth daily at 12 noon.   famotidine 20 MG tablet Commonly known as: PEPCID Take 1 tablet (20 mg total) by mouth 2 (two) times daily.   FLUoxetine 40 MG capsule Commonly known as: PROZAC Take 40 mg by mouth daily.   ibuprofen 100 MG tablet Commonly known as: ADVIL Take 100 mg by mouth every 6 (six) hours as needed for fever. Taking 1x/day usually in morning   levothyroxine 25 MCG tablet Commonly known as: SYNTHROID Take 0.5 tablets (12.5 mcg total) by mouth daily before breakfast.   mirabegron ER 50 MG Tb24 tablet Commonly known as: MYRBETRIQ Take 1 tablet (50 mg total) by mouth daily.   TURMERIC PO Take by mouth.       All past medical history, surgical history, allergies, family history, immunizations andmedications were updated in the EMR today and reviewed under the history and medication portions of their EMR.     No results found for this or any previous visit (from the past 2160 hour(s)).  Patient was never admitted.   ROS: 14 pt review of systems performed and negative (unless mentioned in an HPI)  Objective: BP 138/88 (BP Location: Left Arm, Patient Position: Sitting, Cuff Size: Normal)   Pulse 70   Temp 97.6 F (36.4 C) (Temporal)   Resp 17   Ht 5\' 2"  (1.575 m)   Wt 174 lb 6 oz  (79.1 kg)   LMP 10/18/1990   SpO2 96%   BMI 31.89 kg/m  Gen: Afebrile. No acute distress.  Very pleasant female. HENT: AT. [employer].  Psych: Tearful during discussion.  Otherwise normal  affect, dress and demeanor. Normal speech. Normal thought content and judgment.   Assessment/plan: Anabell Cholico is a 69 y.o. female present for establishment.  Depression with anxiety/memory-focus deficits -Lengthy discussion today surrounding patient's circumstances with her depression anxiety and worsening anger.  She is also noting more memory and focus deficits since her CVA and traumatic brain injury which she  is having a difficult time adapting to. - Continue Prozac 40 mg daily -Added Wellbutrin 150 mg twice daily-added therapy for her depression anxiety and she hopefully will also see benefits with her attention. - Referral to [facility] for therapy-I believe this particular therapist would be a good fit for her. - Tried in the past: celexa and effexor.  - Follow-up in 6 weeks on medication changes    Return in about 6 weeks (around 03/05/2020).   Orders Placed This Encounter  Procedures  . Ambulatory referral to Psychology   Meds ordered this encounter  Medications  . buPROPion (WELLBUTRIN SR) 150 MG 12 hr tablet    Sig: 1 tab in the morning for 3 days, then 1 tab every 12 hours.    Dispense:  60 tablet    Refill:  1    Referral Orders     Ambulatory referral to Psychology   Note is dictated utilizing voice recognition software. Although note has been proof read prior to signing, occasional typographical errors still can be missed. If any questions arise, please do not hesitate to call for verification.  Electronically signed by: Howard Pouch, DO [place]

## 2020-01-23 NOTE — Patient Instructions (Signed)
Referred you to Myra Gianotti for Therapy. I believe she will be a nice fit for you.  In addition to your prozac, start Wellbutrin per label instructions. Follow up in 6 weeks for recheck.   Great to see you today!

## 2020-02-11 ENCOUNTER — Telehealth: Payer: Self-pay

## 2020-02-11 MED ORDER — FLUOXETINE HCL 40 MG PO CAPS: 40.0000 mg | ORAL_CAPSULE | Freq: Every day | ORAL | 1 refills | Status: DC

## 2020-02-11 NOTE — Telephone Encounter (Signed)
Patient refill request   FLUoxetine (PROZAC) 40 MG capsule [id number]    [facility] # [place], [place]

## 2020-02-11 NOTE — Addendum Note (Signed)
Addended by: Howard Pouch A on: 02/11/2020 12:07 PM   Modules accepted: Orders

## 2020-02-11 NOTE — Telephone Encounter (Signed)
RF request for Prozac  LOV: 01/23/2020 Next ov: 03/05/2020 Last written:10/09/2019 #90 x1 refill-  cancelled on 12/19/2019  Please advise

## 2020-02-11 NOTE — Telephone Encounter (Signed)
Pt was called and given information.  

## 2020-02-11 NOTE — Telephone Encounter (Signed)
Refills on prozac completed , please make pt aware.

## 2020-03-05 ENCOUNTER — Other Ambulatory Visit: Payer: Self-pay

## 2020-03-05 ENCOUNTER — Encounter: Payer: Self-pay | Admitting: Family Medicine

## 2020-03-05 ENCOUNTER — Ambulatory Visit (INDEPENDENT_AMBULATORY_CARE_PROVIDER_SITE_OTHER): Payer: Medicare Other | Admitting: Family Medicine

## 2020-03-05 VITALS — BP 140/88 | HR 66 | Temp 97.6°F | Resp 18 | Ht 62.0 in | Wt 172.1 lb

## 2020-03-05 DIAGNOSIS — N3281 Overactive bladder: Secondary | ICD-10-CM | POA: Diagnosis not present

## 2020-03-05 DIAGNOSIS — Z5181 Encounter for therapeutic drug level monitoring: Secondary | ICD-10-CM

## 2020-03-05 DIAGNOSIS — F418 Other specified anxiety disorders: Secondary | ICD-10-CM | POA: Diagnosis not present

## 2020-03-05 DIAGNOSIS — E782 Mixed hyperlipidemia: Secondary | ICD-10-CM

## 2020-03-05 DIAGNOSIS — N1831 Chronic kidney disease, stage 3a: Secondary | ICD-10-CM

## 2020-03-05 DIAGNOSIS — Z79899 Other long term (current) drug therapy: Secondary | ICD-10-CM | POA: Diagnosis not present

## 2020-03-05 DIAGNOSIS — I10 Essential (primary) hypertension: Secondary | ICD-10-CM

## 2020-03-05 DIAGNOSIS — I69311 Memory deficit following cerebral infarction: Secondary | ICD-10-CM

## 2020-03-05 DIAGNOSIS — K219 Gastro-esophageal reflux disease without esophagitis: Secondary | ICD-10-CM

## 2020-03-05 DIAGNOSIS — E039 Hypothyroidism, unspecified: Secondary | ICD-10-CM | POA: Diagnosis not present

## 2020-03-05 DIAGNOSIS — S069X9S Unspecified intracranial injury with loss of consciousness of unspecified duration, sequela: Secondary | ICD-10-CM | POA: Diagnosis not present

## 2020-03-05 MED ORDER — BUPROPION HCL ER (SR) 150 MG PO TB12: 150.0000 mg | ORAL_TABLET | Freq: Two times a day (BID) | ORAL | 1 refills | Status: DC

## 2020-03-05 MED ORDER — DONEPEZIL HCL 5 MG PO TABS: 5.0000 mg | ORAL_TABLET | Freq: Every day | ORAL | 1 refills | Status: DC

## 2020-03-05 MED ORDER — AMLODIPINE BESYLATE 10 MG PO TABS: 10.0000 mg | ORAL_TABLET | Freq: Every day | ORAL | 1 refills | Status: DC

## 2020-03-05 MED ORDER — ESOMEPRAZOLE MAGNESIUM 40 MG PO CPDR: 40.0000 mg | DELAYED_RELEASE_CAPSULE | Freq: Every day | ORAL | 1 refills | Status: DC

## 2020-03-05 NOTE — Progress Notes (Signed)
Patient ID: Abigail Michael, female  DOB: 1951-05-31, 69 y.o.   MRN: [id number] Patient Care Team    Relationship Specialty Notifications Start End  Ma Hillock, DO PCP - General Family Medicine  01/16/18   Sharlene Dory, MD Referring Physician Neurology  08/28/18   Tommi Rumps  Dentistry  07/17/19   [name]  Orthodontics  07/17/19   Feliberto Harts R (Inactive)  Oral Surgery  07/17/19   Delanna Ahmadi, MD Referring Physician Psychiatry  07/17/19    Comment: Neurology    Chief Complaint  Patient presents with  . Anxiety    Pt is doing much better and states "I feel like a new person". Would like ears cleaned out if possible today  . Depression    Subjective: Abigail Michael is a 69 y.o.  female present for follow up Depression with anxiety Patient reports she is doing great on the new regimen and feels more like her old self.  She has an appointment with a therapist at the end of the month.  She is feeling much better and she found a group online of people within her age bracket they get together and go out to eat, go bowling etc.  She states she feels like she is connected with a few people and is making friends. Prior note: Patient presents today to discuss her depression anxiety.  She reports she would like a referral to a therapist or psychologist to discuss her worsening anger.  She is prescribed Prozac 40 mg daily and has been doing well on this medication for a few years.  She states over the last few weeks she has noted she feels more lonely.  She is struggling working through the her feelings of "abandonment "and relocation/move to the [place] area.  She reports even though she is the one that had left [place] she still feels hurt and abandoned by the circumstances.  She has noted she has become more angry and irritable.  Her daughter told her she threw a tantrum the other day and she agrees she let her emotions get out of hand.  She reports she has been drinking more  alcohol, which is not like her.  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  She is having more difficulty with attention and focus and this makes her upset.  When her emotions start to overwhelm her she tries to get out of the house and take a walk or work in the yard.  She has also tried to reach out and join social groups and most recently went bowling.  The past 6 months she has been in a relationship with a man that she has known for years and had been her friend prior.  She has enjoyed this relationship and had reported happiness with the relationship.  Today she endorses feeling guilty  because she knows this relationship is not what she needs and is not working because of the long distance between them.  She has had a few therapy sessions with Dr. [name] in the past.  She really liked Dr. [name] but did not feel a connection with her.  She would like a referral to a different therapist today. Prior note:  Has been on prozac for a little over a year after CVA. She reports she feels her depression and anxiety are worsening. She has had many life changes. Moved from [place] last year to be  close to family. Currently living with her daughter and enjoying being close to her family. Having trouble adjusting after retiring, moving and TBI last year. She denies SI or HI. Tried in the past: celexa and effexor.   Essential hypertension/HLD/morbid obesity/h/o TIA: Pt reports compliance with amlodipine 10 mg QD-although she did forget to take her medications before coming to her appointment today. Blood pressures ranges at home are within normal range.Patient denies chest pain, shortness of breath, dizziness or lower extremity edema.   Pt takes a  daily baby ASA. Pt is  prescribed statin. H/O TIA.  Diet: Low sodium Exercise: Patient reports she has become more active getting out walking more routinely and feeling better. RF: HTN, HLD,  Obesity, h/o CVA, FHX MI  GERD/PPI:  Patient reports compliance with Nexium and Pepcid.    He does need refills today.  Hypothyroidism:  Patient reports compliance with 12.5 mcg levothyroxine daily.   Last thyroid panel completed 04/2019.  Overactive bladder/MS-white matter disease/TIA/TBI: Patient reports her Myrbetriq is working well for her.  She does need refills on this medication.  Medication had been started by neurology in the past.  Patient also reports compliance with Aricept. Prior note: Patient was established with neurology.  However today she states she would like to be referred to a new neurologist if possible.  She asked for refills on her Aricept and dalfampridine.  Explained to patient I be happy to refill her Aricept however dalfampridine is a medication that is prescribed by neurology.   Depression screen [facility] 2/9 03/05/2020 01/23/2020 07/17/2019 05/07/2019 08/28/2018  Decreased Interest 0 1 0 0 0  Down, Depressed, Hopeless 0 1 0 0 0  PHQ - 2 Score 0 2 0 0 0  Altered sleeping 0 0 - - 0  Tired, decreased energy 0 3 - - 0  Change in appetite 0 3 - - 0  Feeling bad or failure about yourself  0 2 - - 0  Trouble concentrating 1 1 - - 2  Moving slowly or fidgety/restless 0 0 - - 0  Suicidal thoughts 0 1 - - 0  PHQ-9 Score 1 12 - - 2  Difficult doing work/chores Not difficult at all Not difficult at all - - Somewhat difficult   GAD 7 : Generalized Anxiety Score 03/05/2020 01/23/2020 05/07/2019 08/28/2018  Nervous, Anxious, on Edge 0 3 0 2  Control/stop worrying 0 0 0 2  Worry too much - different things 0 1 0 2  Trouble relaxing 0 3 0 0  Restless 0 0 0 0  Easily annoyed or irritable 0 3 0 2  Afraid - awful might happen 0 0 0 0  Total GAD 7 Score 0 10 0 8  Anxiety Difficulty Not difficult at all Not difficult at all - Somewhat difficult       Fall Risk  07/17/2019 05/07/2019 08/28/2018 01/16/2018  Falls in the past year? 0 0 0 Yes  Comment - - - Jan 2016  Number falls in  past yr: 0 - 0 1  Injury with Fall? 0 - 0 Yes  Risk for fall due to : - - Impaired balance/gait;Mental status change History of fall(s)  Follow up Falls prevention discussed Falls evaluation completed Falls evaluation completed;Falls prevention discussed Follow up appointment     Immunization History  Administered Date(s) Administered  . Influenza, High Dose Seasonal PF 08/28/2018, 06/13/2019  . PFIZER SARS-COV-2 Vaccination 11/09/2019, 12/03/2019  . Pneumococcal Polysaccharide-23 08/28/2018  . Zoster Recombinat (Shingrix) 05/10/2019, 09/10/2019  No exam data present  Past Medical History:  Diagnosis Date  . Acid reflux   . Brain injury ([place]) 2015   Due to a fall   . 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 Chicken pox   . Constipation   . Depression   . Frequent headaches   . GERD (gastroesophageal reflux disease)   . 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 Hyperlipidemia   . Hypertension   . MS (multiple sclerosis) ([place]) 12/15/2016   possible dx. many MRI w/ positive white matter abnormality. MRI reported stable since 2015. mild progressive  Cognitive loss  . Obstructive sleep apnea    on prior neuro notes 10/2016; no results.   . Osteoporosis    had been on reclast  . Post concussion syndrome 10/2013   ongoing issues with concentration, memory loss, lability; EEG completed and normal.   . TBI (traumatic brain injury) ([place]) 2014   fall at [facility], hit head off of floor. No bleed, "concussion". Seen neurology since for focus and word finding.   Marland Kitchen TIA (transient ischemic attack) 11/30/2016   MRI normal. No lateralization. Speech affected and weakness during a hypotensive episdoe.   . Urinary incontinence    Allergies  Allergen Reactions  . Bee Venom Anaphylaxis  . Amoxicillin Dermatitis    Yeast infection   Past Surgical History:  Procedure Laterality Date  . BACK SURGERY  02/2015   herniated disc L4/5/6  . BREAST BIOPSY  1988    biopsy  . COLONOSCOPY     [place]. Around 2015  . ESOPHAGOGASTRODUODENOSCOPY  2016   [place]  . LAPAROSCOPIC HYSTERECTOMY     partial   . SHOULDER ARTHROSCOPY W/ LABRAL REPAIR Left 02/13/2016   left shoulder  . Study: cognitive eval  06/23/2016   mild cog-linguistic impairment, difficulty with verbal fluency  . Study: EEG   04/12/2015   Studies: EEG completed and normal.   . Study: MRI  04/28/2016   Impression: numerous non-enhancing flair hyperintense lesions w/in the brain .Suspicious for demyelinating disease such as lyme or MS. Her Lyme test were negative.    Family History  Problem Relation Age of Onset  . Lung cancer Mother   . Alcohol abuse Father   . Throat cancer Father   . Lung cancer Father   . Depression Father   . Heart attack Father   . Breast cancer Sister   . Alcohol abuse Brother   . Diabetes Brother   . Mental illness Sister   . Stroke Daughter   . Endometriosis Daughter   . Heart disease Son   . Breast cancer Maternal Aunt   . Diabetes Maternal Grandmother   . GER disease Daughter   . Esophageal cancer Paternal Grandfather   . Colon cancer Neg Hx   . Rectal cancer Neg Hx   . Stomach cancer Neg Hx    Social History   Socioeconomic History  . Marital status: Single    Spouse name: Not on file  . Number of children: 4  . Years of education: two years of college  . Highest education level: Not on file  Occupational History  . Occupation: retired  Tobacco Use  . Smoking status: Former Smoker    Packs/day: 1.00    Years: 19.00    Pack years: 19.00    Quit date: 2012    Years since quitting: 9.3  . Smokeless tobacco: Never Used  . Tobacco  comment: quit 6 years ago  Substance and Sexual Activity  . Alcohol use: Yes    Alcohol/week: 1.0 standard drinks    Types: 1 Glasses of wine per week    Comment: twice a month  . Drug use: Never  . Sexual activity: Not Currently    Partners: Male  Other Topics Concern  . Not on file   Social History Narrative   Single. Lived in [place] area, moved to [place] to be close to her daughters 2018. Lives with her daughter.   Some college, worked as Programmer, systems. Retired.    Social drinker. Former smoker.   Exercises routinely.    Wears dentures.    Drinks caffeine, takes a daily vitamin.   Smoke alarm in the home. Wears her seatbelt.    Feels safe in her relationships.   3-4 cups per day.   Four children (2 biological, 2 she raised as her own).   Social Determinants of Health   Financial Resource Strain:   . Difficulty of Paying Living Expenses:   Food Insecurity:   . Worried About Charity fundraiser in the Last Year:   . Arboriculturist in the Last Year:   Transportation Needs:   . Film/video editor (Medical):   Marland Kitchen Lack of Transportation (Non-Medical):   Physical Activity:   . Days of Exercise per Week:   . Minutes of Exercise per Session:   Stress:   . Feeling of Stress :   Social Connections:   . Frequency of Communication with Friends and Family:   . Frequency of Social Gatherings with Friends and Family:   . Attends Religious Services:   . Active Member of Clubs or Organizations:   . Attends Archivist Meetings:   Marland Kitchen Marital Status:   Intimate Partner Violence:   . Fear of Current or Ex-Partner:   . Emotionally Abused:   Marland Kitchen Physically Abused:   . Sexually Abused:    Allergies as of 03/05/2020      Reactions   Bee Venom Anaphylaxis   Amoxicillin Dermatitis   Yeast infection      Medication List       Accurate as of Mar 05, 2020  5:28 PM. If you have any questions, ask your nurse or doctor.        STOP taking these medications   AMBULATORY NON FORMULARY MEDICATION Stopped by: Howard Pouch, DO   ibuprofen 100 MG tablet Commonly known as: ADVIL Stopped by: Howard Pouch, DO     TAKE these medications   amLODipine 10 MG tablet Commonly known as: NORVASC Take 1 tablet (10 mg total) by mouth daily.   aspirin EC 81 MG tablet Take 81  mg by mouth daily.   atorvastatin 40 MG tablet Commonly known as: LIPITOR Take 1 tablet (40 mg total) by mouth daily.   buPROPion 150 MG 12 hr tablet Commonly known as: Wellbutrin SR Take 1 tablet (150 mg total) by mouth 2 (two) times daily. What changed:   how much to take  how to take this  when to take this  additional instructions Changed by: Howard Pouch, DO   donepezil 5 MG tablet Commonly known as: ARICEPT Take 1 tablet (5 mg total) by mouth at bedtime.   EPINEPHrine 0.3 mg/0.3 mL Soaj injection Commonly known as: EPI-PEN Inject 0.3 mLs (0.3 mg total) into the skin as needed.   esomeprazole 40 MG capsule Commonly known as: NEXIUM Take 1 capsule (40 mg total)  by mouth daily at 12 noon.   famotidine 20 MG tablet Commonly known as: PEPCID Take 1 tablet (20 mg total) by mouth 2 (two) times daily.   FLUoxetine 40 MG capsule Commonly known as: PROZAC Take 1 capsule (40 mg total) by mouth daily.   levothyroxine 25 MCG tablet Commonly known as: SYNTHROID Take 0.5 tablets (12.5 mcg total) by mouth daily before breakfast.   mirabegron ER 50 MG Tb24 tablet Commonly known as: MYRBETRIQ Take 1 tablet (50 mg total) by mouth daily.   TURMERIC PO Take by mouth.       All past medical history, surgical history, allergies, family history, immunizations andmedications were updated in the EMR today and reviewed under the history and medication portions of their EMR.     No results found for this or any previous visit (from the past 2160 hour(s)).  Patient was never admitted.   ROS: 14 pt review of systems performed and negative (unless mentioned in an HPI)  Objective: BP 140/88 (BP Location: Right Arm, Patient Position: Sitting, Cuff Size: Normal)   Pulse 66   Temp 97.6 F (36.4 C) (Temporal)   Resp 18   Ht 5\' 2"  (1.575 m)   Wt 172 lb 2 oz (78.1 kg)   LMP 10/18/1990   SpO2 96%   BMI 31.48 kg/m  Gen: Afebrile. No acute distress.  Nontoxic in presentation.   Well-nourished, well-developed HENT: AT. [place].  Eyes:Pupils Equal Round Reactive to light, Extraocular movements intact,  Conjunctiva without redness, discharge or icterus. CV: RRR no murmur, no edema, +2/4 P posterior tibialis pulses Chest: CTAB, no wheeze or crackles Skin: No rashes, purpura or petechiae.  Neuro:  Normal gait. PERLA. EOMi. Alert. Oriented x3  Psych: Normal affect, dress and demeanor. Normal speech. Normal thought content and judgment.   Assessment/plan: Abigail Michael is a 69 y.o. female present for establishment.  Depression with anxiety/memory-focus deficits -Patient feels much improved with the added Wellbutrin. - Continue Prozac 40 mg daily -Continue Wellbutrin 150 mg twice daily-added therapy for her depression anxiety and she hopefully will also see benefits with her attention. - Referral to Myra Gianotti for therapy was placed last visit and she has her establishment appointment coming up at the end of this month. - Tried in the past: celexa and effexor.  - Follow-up in 5.5 months.  Essential hypertension/morbid obesity/HLD Blood pressure is mildly upper goal today however she has not taken her medication yet today.   Continue  amlodipine 10 mg daily to [facility]. -Continue atorvastatin 40 mg daily, refills called into [facility]. - continue statin and ASA F/u 6 mos  Traumatic brain injury with loss of consciousness, sequela (HCC)/White matter disease/TIA/overactive bladder -Continue Aricept 5 mg--called into [facility]. -Continue Myrbetriq 50 mg daily.  Refills not needed at this time.  This prescription gets called into mail-in pharmacy. She worked with physical therapy and did really well with improving her gait. Patient is established with neurology.  Encounter for monitoring long-term proton pump inhibitor therapy/GERD -Stable. Continue omeprazole. Continue Pepcid.  Return in about 25 weeks (around 08/27/2020).   No orders of the defined types were placed  in this encounter.  Meds ordered this encounter  Medications  . buPROPion (WELLBUTRIN SR) 150 MG 12 hr tablet    Sig: Take 1 tablet (150 mg total) by mouth 2 (two) times daily.    Dispense:  180 tablet    Refill:  1  . donepezil (ARICEPT) 5 MG tablet    Sig: Take 1  tablet (5 mg total) by mouth at bedtime.    Dispense:  90 tablet    Refill:  1  . esomeprazole (NEXIUM) 40 MG capsule    Sig: Take 1 capsule (40 mg total) by mouth daily at 12 noon.    Dispense:  90 capsule    Refill:  1    DC prior dose  . amLODipine (NORVASC) 10 MG tablet    Sig: Take 1 tablet (10 mg total) by mouth daily.    Dispense:  90 tablet    Refill:  1   Referral Orders  No referral(s) requested today     Note is dictated utilizing voice recognition software. Although note has been proof read prior to signing, occasional typographical errors still can be missed. If any questions arise, please do not hesitate to call for verification.  Electronically signed by: Howard Pouch, DO [place]

## 2020-03-05 NOTE — Patient Instructions (Signed)
Next appt end of November> labs will be collected at that time.   I have refilled your medications for you.   I hope you have a great summer.

## 2020-03-13 ENCOUNTER — Ambulatory Visit (INDEPENDENT_AMBULATORY_CARE_PROVIDER_SITE_OTHER): Payer: Medicare Other | Admitting: Psychology

## 2020-03-13 ENCOUNTER — Ambulatory Visit: Payer: Medicare Other | Admitting: Psychology

## 2020-03-13 DIAGNOSIS — F418 Other specified anxiety disorders: Secondary | ICD-10-CM | POA: Diagnosis not present

## 2020-04-15 ENCOUNTER — Telehealth: Payer: Self-pay

## 2020-04-15 MED ORDER — LEVOTHYROXINE SODIUM 25 MCG PO TABS: 12.5000 ug | ORAL_TABLET | Freq: Every day | ORAL | 0 refills | Status: DC

## 2020-04-15 NOTE — Telephone Encounter (Signed)
Patient called in stating that she is on vacation and tried to refill her prescription and it was too early wanting to know if she can have a one time refill sent to pharmacy where she is   levothyroxine (SYNTHROID) 25 MCG tablet   Pharmacy wanting refill to be sent to is :   [id number] - [place], [place] - [address]

## 2020-04-15 NOTE — Telephone Encounter (Signed)
Pt was called and explained when she left a month ago for [place] she tried to get it refilled at [facility] but it was to early at the time to get filled. Not she can get it but will be out of town for another Month. 30 day supply sent to pharmacy.

## 2020-05-12 ENCOUNTER — Ambulatory Visit (INDEPENDENT_AMBULATORY_CARE_PROVIDER_SITE_OTHER): Payer: Medicare Other | Admitting: Psychology

## 2020-05-12 DIAGNOSIS — F419 Anxiety disorder, unspecified: Secondary | ICD-10-CM | POA: Diagnosis not present

## 2020-05-12 DIAGNOSIS — F329 Major depressive disorder, single episode, unspecified: Secondary | ICD-10-CM

## 2020-05-14 ENCOUNTER — Other Ambulatory Visit: Payer: Self-pay | Admitting: Family Medicine

## 2020-05-23 ENCOUNTER — Telehealth: Payer: Self-pay

## 2020-05-26 ENCOUNTER — Ambulatory Visit (INDEPENDENT_AMBULATORY_CARE_PROVIDER_SITE_OTHER): Payer: Medicare Other | Admitting: Psychology

## 2020-05-26 DIAGNOSIS — F419 Anxiety disorder, unspecified: Secondary | ICD-10-CM | POA: Diagnosis not present

## 2020-05-26 DIAGNOSIS — F329 Major depressive disorder, single episode, unspecified: Secondary | ICD-10-CM | POA: Diagnosis not present

## 2020-05-26 MED ORDER — LEVOTHYROXINE SODIUM 25 MCG PO TABS: ORAL_TABLET | ORAL | 0 refills | Status: DC

## 2020-05-27 NOTE — Telephone Encounter (Signed)
Received a refill fax request Last office visit- 02-28-2020 Last TSH lab- 05-07-19

## 2020-06-09 ENCOUNTER — Ambulatory Visit (INDEPENDENT_AMBULATORY_CARE_PROVIDER_SITE_OTHER): Payer: Medicare Other | Admitting: Psychology

## 2020-06-09 DIAGNOSIS — F419 Anxiety disorder, unspecified: Secondary | ICD-10-CM | POA: Diagnosis not present

## 2020-06-09 DIAGNOSIS — F329 Major depressive disorder, single episode, unspecified: Secondary | ICD-10-CM | POA: Diagnosis not present

## 2020-06-24 ENCOUNTER — Other Ambulatory Visit: Payer: Self-pay | Admitting: Family Medicine

## 2020-06-24 DIAGNOSIS — Z1231 Encounter for screening mammogram for malignant neoplasm of breast: Secondary | ICD-10-CM

## 2020-06-27 ENCOUNTER — Ambulatory Visit (INDEPENDENT_AMBULATORY_CARE_PROVIDER_SITE_OTHER): Payer: Medicare Other | Admitting: Psychology

## 2020-06-27 DIAGNOSIS — F419 Anxiety disorder, unspecified: Secondary | ICD-10-CM | POA: Diagnosis not present

## 2020-06-27 DIAGNOSIS — F329 Major depressive disorder, single episode, unspecified: Secondary | ICD-10-CM | POA: Diagnosis not present

## 2020-06-30 ENCOUNTER — Encounter: Payer: Self-pay | Admitting: Neurology

## 2020-06-30 ENCOUNTER — Ambulatory Visit (INDEPENDENT_AMBULATORY_CARE_PROVIDER_SITE_OTHER): Payer: Medicare Other | Admitting: Neurology

## 2020-06-30 ENCOUNTER — Other Ambulatory Visit: Payer: Self-pay

## 2020-06-30 VITALS — BP 132/80 | HR 66 | Ht 62.0 in | Wt 166.0 lb

## 2020-06-30 DIAGNOSIS — I69311 Memory deficit following cerebral infarction: Secondary | ICD-10-CM

## 2020-06-30 DIAGNOSIS — R9082 White matter disease, unspecified: Secondary | ICD-10-CM | POA: Diagnosis not present

## 2020-06-30 MED ORDER — DONEPEZIL HCL 10 MG PO TABS
10.0000 mg | ORAL_TABLET | Freq: Every day | ORAL | 3 refills | Status: DC
Start: 2020-06-30 — End: 2021-01-06

## 2020-06-30 NOTE — Progress Notes (Signed)
Reason for visit: Small vessel disease by MRI, memory disturbance  Abigail Michael is an 69 y.o. female  History of present illness:  Abigail Michael is a 69 year old right-handed white female with a history of a concussion from a fall in 2015, she claims that her cognitive status has not been the same since that time.  She believes that her memory has gradually worsened over time.  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  She has had a carotid Doppler study and 2D echocardiogram done in 2018 before moving to this area from [place].  The studies were unremarkable.  The patient is on low-dose aspirin.  She continues to function independently, she drives a car and uses GPS to get around, she is able to pay her bills and keep up with medications and appointments.  She is sleeping well at night and has a good energy level during the day.  She denies any severe gait instability problems, she sometimes feels as if she is drifting to the left and she occasionally may catch her right toe with walking.  She is no longer on Ampyra, this made no difference in her walking when the medication was discontinued.  She returns to this office for an evaluation.  Past Medical History:  Diagnosis Date  . Acid reflux   . Brain injury ([place]) 2015   Due to a fall   . 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 Chicken pox   . Constipation   . Depression   . Frequent headaches   . GERD (gastroesophageal reflux disease)   . 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 Hyperlipidemia   . Hypertension   . MS (multiple sclerosis) ([place]) 12/15/2016   possible dx. many MRI w/ positive white matter abnormality. MRI reported stable since 2015. mild progressive  Cognitive loss  . Obstructive sleep apnea    on prior neuro notes 10/2016; no results.   .  Osteoporosis    had been on reclast  . Post concussion syndrome 10/2013   ongoing issues with concentration, memory loss, lability; EEG completed and normal.   . TBI (traumatic brain injury) ([place]) 2014   fall at [facility], hit head off of floor. No bleed, "concussion". Seen neurology since for focus and word finding.   Marland Kitchen TIA (transient ischemic attack) 11/30/2016   MRI normal. No lateralization. Speech affected and weakness during a hypotensive episdoe.   . Urinary incontinence     Past Surgical History:  Procedure Laterality Date  . BACK SURGERY  02/2015   herniated disc L4/5/6  . BREAST BIOPSY  1988   biopsy  . COLONOSCOPY     [place]. Around 2015  . ESOPHAGOGASTRODUODENOSCOPY  2016   [place]  . LAPAROSCOPIC HYSTERECTOMY     partial   . SHOULDER ARTHROSCOPY W/ LABRAL REPAIR Left 02/13/2016   left shoulder  . Study: cognitive eval  06/23/2016   mild cog-linguistic impairment, difficulty with verbal fluency  . Study: EEG   04/12/2015   Studies: EEG completed and normal.   . Study: MRI  04/28/2016   Impression: numerous non-enhancing flair hyperintense lesions w/in the brain .Suspicious for demyelinating disease such as lyme or MS. Her Lyme test were negative.     Family History  Problem Relation Age of Onset  .  Lung cancer Mother   . Alcohol abuse Father   . Throat cancer Father   . Lung cancer Father   . Depression Father   . Heart attack Father   . Breast cancer Sister   . Alcohol abuse Brother   . Diabetes Brother   . Mental illness Sister   . Stroke Daughter   . Endometriosis Daughter   . Heart disease Son   . Breast cancer Maternal Aunt   . Diabetes Maternal Grandmother   . GER disease Daughter   . Esophageal cancer Paternal Grandfather   . Colon cancer Neg Hx   . Rectal cancer Neg Hx   . Stomach cancer Neg Hx     Social history:  reports that she quit smoking about 9 years ago. She has a 19.00 pack-year smoking history. She has never used  smokeless tobacco. She reports current alcohol use of about 1.0 standard drink of alcohol per week. She reports that she does not use drugs.    Allergies  Allergen Reactions  . Bee Venom Anaphylaxis  . Amoxicillin Dermatitis    Yeast infection    Medications:  Prior to Admission medications   Medication Sig Start Date End Date Taking? Authorizing Provider  amLODipine (NORVASC) 10 MG tablet Take 1 tablet (10 mg total) by mouth daily. 03/05/20   Kuneff, Renee A, DO  aspirin EC 81 MG tablet Take 81 mg by mouth daily.    [provider]  atorvastatin (LIPITOR) 40 MG tablet Take 1 tablet (40 mg total) by mouth daily. 10/09/19   Kuneff, Renee A, DO  buPROPion (WELLBUTRIN SR) 150 MG 12 hr tablet Take 1 tablet (150 mg total) by mouth 2 (two) times daily. 03/05/20   Kuneff, Renee A, DO  donepezil (ARICEPT) 5 MG tablet Take 1 tablet (5 mg total) by mouth at bedtime. 03/05/20   Kuneff, Renee A, DO  EPINEPHrine 0.3 mg/0.3 mL IJ SOAJ injection Inject 0.3 mLs (0.3 mg total) into the skin as needed. 05/07/19   Kuneff, Renee A, DO  esomeprazole (NEXIUM) 40 MG capsule Take 1 capsule (40 mg total) by mouth daily at 12 noon. 03/05/20   Kuneff, Renee A, DO  famotidine (PEPCID) 20 MG tablet Take 1 tablet (20 mg total) by mouth 2 (two) times daily. 10/09/19   Kuneff, Renee A, DO  FLUoxetine (PROZAC) 40 MG capsule Take 1 capsule (40 mg total) by mouth daily. 02/11/20   Kuneff, Renee A, DO  levothyroxine (SYNTHROID) 25 MCG tablet TAKE 1/2 TABLET BY MOUTH BEFORE BREAKFAST. 05/26/20   Kuneff, Renee A, DO  mirabegron ER (MYRBETRIQ) 50 MG TB24 tablet Take 1 tablet (50 mg total) by mouth daily. 10/08/19   Kuneff, Renee A, DO  TURMERIC PO Take by mouth.    [provider]    ROS:  Out of a complete 14 system review of symptoms, the patient complains only of the following symptoms, and all other reviewed systems are negative.  Memory problems Slight walking difficulty  Blood pressure 132/80, pulse 66,  height 5\' 2"  (1.575 m), weight 166 lb (75.3 kg), last menstrual period 10/18/1990.  Physical Exam  General: The patient is alert and cooperative at the time of the examination.  Skin: No significant peripheral edema is noted.   Neurologic Exam  Mental status: The patient is alert and oriented x 3 at the time of the examination. The patient has apparent normal recent and remote memory, with an apparently normal attention span and concentration ability.  Cranial nerves: Facial symmetry is present. Speech is normal, no aphasia or dysarthria is noted. Extraocular movements are full. Visual fields are full.  Motor: The patient has good strength in all 4 extremities.  Sensory examination: Soft touch sensation is symmetric on the face, arms, and legs.  Coordination: The patient has good finger-nose-finger and heel-to-shin bilaterally.  Gait and station: The patient has a normal gait. Tandem gait is normal. Romberg is negative, but is slightly unsteady. No drift is seen.  Reflexes: Deep tendon reflexes are symmetric.   Assessment/Plan:  1.  Mild memory problems, mild cognitive impairment  2.  Small vessel disease by MRI brain  3.  History of concussion  The patient reports some alteration in her memory since the concussion, but she believes that the memory has gradually and slowly worsened since 2015.  She is on low-dose Aricept, I will maximize the dose to 10 mg at night.  She will follow up here in 6 months.  We will follow the memory issues over time.  Jill Alexanders MD 06/30/2020 9:51 AM  [facility] [address] [place], [place] [address]  Phone [phone] Fax [phone]

## 2020-07-01 ENCOUNTER — Other Ambulatory Visit: Payer: Self-pay

## 2020-07-01 ENCOUNTER — Encounter: Payer: Self-pay | Admitting: Family Medicine

## 2020-07-01 ENCOUNTER — Ambulatory Visit (INDEPENDENT_AMBULATORY_CARE_PROVIDER_SITE_OTHER): Payer: Medicare Other | Admitting: Family Medicine

## 2020-07-01 VITALS — BP 125/81 | HR 66 | Temp 98.1°F | Resp 16 | Wt 166.0 lb

## 2020-07-01 DIAGNOSIS — L819 Disorder of pigmentation, unspecified: Secondary | ICD-10-CM

## 2020-07-01 NOTE — Progress Notes (Signed)
This visit occurred during the SARS-CoV-2 public health emergency.  Safety protocols were in place, including screening questions prior to the visit, additional usage of staff PPE, and extensive cleaning of exam room while observing appropriate contact time as indicated for disinfecting solutions.    Abigail Michael , April 19, 1951, 69 y.o., female MRN: [id number] Patient Care Team    Relationship Specialty Notifications Start End  Ma Hillock, DO PCP - General Family Medicine  01/16/18   Sharlene Dory, MD Referring Physician Neurology  08/28/18   Tommi Rumps  Dentistry  07/17/19   [name]  Orthodontics  07/17/19   Feliberto Harts R (Inactive)  Oral Surgery  07/17/19   Delanna Ahmadi, MD Referring Physician Psychiatry  07/17/19    Comment: Neurology    Chief Complaint  Patient presents with  . Nevus     Subjective: Pt presents for an OV for evaluation of new skin lesion she noticed about 1 month ago. She denies irritation. She has no personal or family history of skin cancers. She reports she noticed it bc it was so dark.   Depression screen [facility] 2/9 07/01/2020 03/05/2020 01/23/2020 07/17/2019 05/07/2019  Decreased Interest 0 0 1 0 0  Down, Depressed, Hopeless 0 0 1 0 0  PHQ - 2 Score 0 0 2 0 0  Altered sleeping 0 0 0 - -  Tired, decreased energy 0 0 3 - -  Change in appetite 0 0 3 - -  Feeling bad or failure about yourself  0 0 2 - -  Trouble concentrating 0 1 1 - -  Moving slowly or fidgety/restless 0 0 0 - -  Suicidal thoughts 0 0 1 - -  PHQ-9 Score 0 1 12 - -  Difficult doing work/chores - Not difficult at all Not difficult at all - -    Allergies  Allergen Reactions  . Bee Venom Anaphylaxis  . Amoxicillin Dermatitis    Yeast infection   Social History   Social History Narrative   Single. Lived in [place] area, moved to [place] to be close to her daughters 2018. Lives with her daughter.   Some college, worked as Programmer, systems. Retired.    Social drinker. Former smoker.    Exercises routinely.    Wears dentures.    Drinks caffeine, takes a daily vitamin.   Smoke alarm in the home. Wears her seatbelt.    Feels safe in her relationships.   3-4 cups per day.   Four children (2 biological, 2 she raised as her own).   Past Medical History:  Diagnosis Date  . Acid reflux   . Brain injury ([place]) 2015   Due to a fall   . 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 Chicken pox   . Constipation   . Depression   . Frequent headaches   . GERD (gastroesophageal reflux disease)   . 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 Hyperlipidemia   . Hypertension   . MS (multiple sclerosis) ([place]) 12/15/2016   possible dx. many MRI w/ positive white matter abnormality. MRI reported stable since 2015. mild progressive  Cognitive loss  . Obstructive sleep apnea    on prior neuro notes 10/2016; no results.   . Osteoporosis    had been on reclast  . Post concussion syndrome 10/2013   ongoing issues with concentration, memory loss, lability; EEG completed and normal.   . TBI (traumatic brain injury) (  [place]) 2014   fall at [facility], hit head off of floor. No bleed, "concussion". Seen neurology since for focus and word finding.   Marland Kitchen TIA (transient ischemic attack) 11/30/2016   MRI normal. No lateralization. Speech affected and weakness during a hypotensive episdoe.   . Urinary incontinence    Past Surgical History:  Procedure Laterality Date  . BACK SURGERY  02/2015   herniated disc L4/5/6  . BREAST BIOPSY  1988   biopsy  . COLONOSCOPY     [place]. Around 2015  . ESOPHAGOGASTRODUODENOSCOPY  2016   [place]  . LAPAROSCOPIC HYSTERECTOMY     partial   . SHOULDER ARTHROSCOPY W/ LABRAL REPAIR Left 02/13/2016   left shoulder  . Study: cognitive eval  06/23/2016   mild cog-linguistic impairment, difficulty with verbal fluency  . Study: EEG   04/12/2015   Studies: EEG completed and normal.   . Study: MRI  04/28/2016   Impression:  numerous non-enhancing flair hyperintense lesions w/in the brain .Suspicious for demyelinating disease such as lyme or MS. Her Lyme test were negative.    Family History  Problem Relation Age of Onset  . Lung cancer Mother   . Alcohol abuse Father   . Throat cancer Father   . Lung cancer Father   . Depression Father   . Heart attack Father   . Breast cancer Sister   . Alcohol abuse Brother   . Diabetes Brother   . Mental illness Sister   . Stroke Daughter   . Endometriosis Daughter   . Heart disease Son   . Breast cancer Maternal Aunt   . Diabetes Maternal Grandmother   . GER disease Daughter   . Esophageal cancer Paternal Grandfather   . Colon cancer Neg Hx   . Rectal cancer Neg Hx   . Stomach cancer Neg Hx    Allergies as of 07/01/2020      Reactions   Bee Venom Anaphylaxis   Amoxicillin Dermatitis   Yeast infection      Medication List       Accurate as of July 01, 2020  9:10 AM. If you have any questions, ask your nurse or doctor.        amLODipine 10 MG tablet Commonly known as: NORVASC Take 1 tablet (10 mg total) by mouth daily.   aspirin EC 81 MG tablet Take 81 mg by mouth daily.   atorvastatin 40 MG tablet Commonly known as: LIPITOR Take 1 tablet (40 mg total) by mouth daily.   buPROPion 150 MG 12 hr tablet Commonly known as: Wellbutrin SR Take 1 tablet (150 mg total) by mouth 2 (two) times daily.   donepezil 10 MG tablet Commonly known as: ARICEPT Take 1 tablet (10 mg total) by mouth at bedtime.   EPINEPHrine 0.3 mg/0.3 mL Soaj injection Commonly known as: EPI-PEN Inject 0.3 mLs (0.3 mg total) into the skin as needed.   esomeprazole 40 MG capsule Commonly known as: NEXIUM Take 1 capsule (40 mg total) by mouth daily at 12 noon.   famotidine 20 MG tablet Commonly known as: PEPCID Take 1 tablet (20 mg total) by mouth 2 (two) times daily.   FLUoxetine 40 MG capsule Commonly known as: PROZAC Take 1 capsule (40 mg total) by mouth daily.    levothyroxine 25 MCG tablet Commonly known as: SYNTHROID TAKE 1/2 TABLET BY MOUTH BEFORE BREAKFAST.   mirabegron ER 50 MG Tb24 tablet Commonly known as: MYRBETRIQ Take 1 tablet (50 mg total) by mouth daily.  TURMERIC PO Take by mouth.       All past medical history, surgical history, allergies, family history, immunizations andmedications were updated in the EMR today and reviewed under the history and medication portions of their EMR.     ROS: Negative, with the exception of above mentioned in HPI   Objective:  BP 125/81 (BP Location: Left Arm, Patient Position: Sitting, Cuff Size: Normal)   Pulse 66   Temp 98.1 F (36.7 C) (Oral)   Resp 16   Wt 166 lb (75.3 kg)   LMP 10/18/1990   SpO2 96%   BMI 30.36 kg/m  Body mass index is 30.36 kg/m. Gen: Afebrile. No acute distress. Nontoxic in appearance, well developed, well nourished.  Skin: 3 mm mildly raised hyperpigmented lesion with mildly irregular shape and coloring. no rashes, purpura or petechiae.  Psych: Normal affect, dress and demeanor. Normal speech. Normal thought content and judgment.  No exam data present No results found. No results found for this or any previous visit (from the past 24 hour(s)).  Assessment/Plan: Abigail Michael is a 69 y.o. female present for OV for  Hyperpigmented skin lesion Discussed referral to dermatology to biopsy lesion given location left side of breast. She is agreeable.  - Ambulatory referral to Dermatology   Reviewed expectations re: course of current medical issues.  Discussed self-management of symptoms.  Outlined signs and symptoms indicating need for more acute intervention.  Patient verbalized understanding and all questions were answered.  Patient received an After-Visit Summary.    No orders of the defined types were placed in this encounter.  No orders of the defined types were placed in this encounter.  Referral Orders  No referral(s) requested today      Note is dictated utilizing voice recognition software. Although note has been proof read prior to signing, occasional typographical errors still can be missed. If any questions arise, please do not hesitate to call for verification.   electronically signed by:  Howard Pouch, DO  [place]

## 2020-07-01 NOTE — Patient Instructions (Signed)
I have referred you to dermatology to biopsy your skin lesion.  They will call you to schedule.

## 2020-07-16 ENCOUNTER — Ambulatory Visit (INDEPENDENT_AMBULATORY_CARE_PROVIDER_SITE_OTHER): Payer: Medicare Other | Admitting: Psychology

## 2020-07-16 DIAGNOSIS — F419 Anxiety disorder, unspecified: Secondary | ICD-10-CM | POA: Diagnosis not present

## 2020-07-16 DIAGNOSIS — F329 Major depressive disorder, single episode, unspecified: Secondary | ICD-10-CM

## 2020-07-22 ENCOUNTER — Ambulatory Visit: Payer: Medicare Other

## 2020-07-23 ENCOUNTER — Ambulatory Visit: Payer: Medicare Other | Admitting: Psychology

## 2020-07-23 ENCOUNTER — Ambulatory Visit: Payer: Medicare Other

## 2020-07-23 DIAGNOSIS — D2239 Melanocytic nevi of other parts of face: Secondary | ICD-10-CM | POA: Diagnosis not present

## 2020-07-23 DIAGNOSIS — L821 Other seborrheic keratosis: Secondary | ICD-10-CM | POA: Diagnosis not present

## 2020-07-23 DIAGNOSIS — D225 Melanocytic nevi of trunk: Secondary | ICD-10-CM | POA: Diagnosis not present

## 2020-07-23 DIAGNOSIS — D1801 Hemangioma of skin and subcutaneous tissue: Secondary | ICD-10-CM | POA: Diagnosis not present

## 2020-07-23 DIAGNOSIS — L814 Other melanin hyperpigmentation: Secondary | ICD-10-CM | POA: Diagnosis not present

## 2020-08-05 ENCOUNTER — Ambulatory Visit (INDEPENDENT_AMBULATORY_CARE_PROVIDER_SITE_OTHER): Payer: Medicare Other | Admitting: Psychology

## 2020-08-05 DIAGNOSIS — F329 Major depressive disorder, single episode, unspecified: Secondary | ICD-10-CM

## 2020-08-05 DIAGNOSIS — F419 Anxiety disorder, unspecified: Secondary | ICD-10-CM

## 2020-08-06 ENCOUNTER — Other Ambulatory Visit: Payer: Self-pay | Admitting: Family Medicine

## 2020-08-06 ENCOUNTER — Ambulatory Visit (INDEPENDENT_AMBULATORY_CARE_PROVIDER_SITE_OTHER): Payer: Medicare Other

## 2020-08-06 VITALS — Ht 62.0 in | Wt 164.0 lb

## 2020-08-06 DIAGNOSIS — Z0000 Encounter for general adult medical examination without abnormal findings: Secondary | ICD-10-CM

## 2020-08-06 DIAGNOSIS — Z1231 Encounter for screening mammogram for malignant neoplasm of breast: Secondary | ICD-10-CM | POA: Diagnosis not present

## 2020-08-06 NOTE — Progress Notes (Signed)
Subjective:   Abigail Michael is a 69 y.o. female who presents for Medicare Annual (Subsequent) preventive examination.  I connected with Tashya today by telephone and verified that I am speaking with the correct person using two identifiers. Location patient: home Location provider: work Persons participating in the virtual visit: patient, Marine scientist.    I discussed the limitations, risks, security and privacy concerns of performing an evaluation and management service by telephone and the availability of in person appointments. I also discussed with the patient that there may be a patient responsible charge related to this service. The patient expressed understanding and verbally consented to this telephonic visit.    Interactive audio and video telecommunications were attempted between this provider and patient, however failed, due to patient having technical difficulties OR patient did not have access to video capability.  We continued and completed visit with audio only.  Some vital signs may be absent or patient reported.   Time Spent with patient on telephone encounter: 20 minutes  Review of Systems     Cardiac Risk Factors include: advanced age (>49men, >81 women);dyslipidemia;hypertension;obesity (BMI >30kg/m2)     Objective:    Today's Vitals   08/06/20 0900  Weight: 164 lb (74.4 kg)  Height: 5\' 2"  (1.575 m)   Body mass index is 30 kg/m.  Advanced Directives 08/06/2020 07/17/2019 09/05/2018 08/22/2018  Does Patient Have a Medical Advance Directive? No Yes No No  Type of Advance Directive - Healthcare Power of [place];Living will - -  Copy of [place] in Chart? - No - copy requested - -  Would patient like information on creating a medical advance directive? Yes (MAU/Ambulatory/Procedural Areas - Information given) - No - Patient declined Yes (MAU/Ambulatory/Procedural Areas - Information given)    Current Medications (verified) Outpatient Encounter  Medications as of 08/06/2020  Medication Sig  . amLODipine (NORVASC) 10 MG tablet Take 1 tablet (10 mg total) by mouth daily.  Marland Kitchen aspirin EC 81 MG tablet Take 81 mg by mouth daily.   Marland Kitchen atorvastatin (LIPITOR) 40 MG tablet Take 1 tablet (40 mg total) by mouth daily.  Marland Kitchen buPROPion (WELLBUTRIN SR) 150 MG 12 hr tablet Take 1 tablet (150 mg total) by mouth 2 (two) times daily.  Marland Kitchen donepezil (ARICEPT) 10 MG tablet Take 1 tablet (10 mg total) by mouth at bedtime.  Marland Kitchen EPINEPHrine 0.3 mg/0.3 mL IJ SOAJ injection Inject 0.3 mLs (0.3 mg total) into the skin as needed.  Marland Kitchen esomeprazole (NEXIUM) 40 MG capsule Take 1 capsule (40 mg total) by mouth daily at 12 noon.  . famotidine (PEPCID) 20 MG tablet Take 1 tablet (20 mg total) by mouth 2 (two) times daily.  Marland Kitchen FLUoxetine (PROZAC) 40 MG capsule Take 1 capsule (40 mg total) by mouth daily.  Marland Kitchen levothyroxine (SYNTHROID) 25 MCG tablet TAKE 1/2 TABLET BY MOUTH BEFORE BREAKFAST.  Marland Kitchen mirabegron ER (MYRBETRIQ) 50 MG TB24 tablet Take 1 tablet (50 mg total) by mouth daily.  . TURMERIC PO Take by mouth.    No facility-administered encounter medications on file as of 08/06/2020.    Allergies (verified) Bee venom and Amoxicillin   History: Past Medical History:  Diagnosis Date  . Acid reflux   . Brain injury ([place]) 2015   Due to a fall   . 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 Chicken pox   . Constipation   . Depression   . Frequent headaches   . GERD (gastroesophageal  reflux disease)   . 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 Hyperlipidemia   . Hypertension   . MS (multiple sclerosis) ([employer]) 12/15/2016   possible dx. many MRI w/ positive white matter abnormality. MRI reported stable since 2015. mild progressive  Cognitive loss  . Obstructive sleep apnea    on prior neuro notes 10/2016; no results.   . Osteoporosis    had been on reclast  . Post concussion syndrome 10/2013   ongoing issues with concentration, memory loss,  lability; EEG completed and normal.   . TBI (traumatic brain injury) ([place]) 2014   fall at [facility], hit head off of floor. No bleed, "concussion". Seen neurology since for focus and word finding.   Marland Kitchen TIA (transient ischemic attack) 11/30/2016   MRI normal. No lateralization. Speech affected and weakness during a hypotensive episdoe.   . Urinary incontinence    Past Surgical History:  Procedure Laterality Date  . BACK SURGERY  02/2015   herniated disc L4/5/6  . BREAST BIOPSY  1988   biopsy  . COLONOSCOPY     [place] Around 2015  . ESOPHAGOGASTRODUODENOSCOPY  2016   [place]  . LAPAROSCOPIC HYSTERECTOMY     partial   . SHOULDER ARTHROSCOPY W/ LABRAL REPAIR Left 02/13/2016   left shoulder  . Study: cognitive eval  06/23/2016   mild cog-linguistic impairment, difficulty with verbal fluency  . Study: EEG   04/12/2015   Studies: EEG completed and normal.   . Study: MRI  04/28/2016   Impression: numerous non-enhancing flair hyperintense lesions w/in the brain .Suspicious for demyelinating disease such as lyme or MS. Her Lyme test were negative.    Family History  Problem Relation Age of Onset  . Lung cancer Mother   . Alcohol abuse Father   . Throat cancer Father   . Lung cancer Father   . Depression Father   . Heart attack Father   . Breast cancer Sister   . Alcohol abuse Brother   . Diabetes Brother   . Mental illness Sister   . Stroke Daughter   . Endometriosis Daughter   . Heart disease Son   . Breast cancer Maternal Aunt   . Diabetes Maternal Grandmother   . GER disease Daughter   . Esophageal cancer Paternal Grandfather   . Colon cancer Neg Hx   . Rectal cancer Neg Hx   . Stomach cancer Neg Hx    Social History   Socioeconomic History  . Marital status: Single    Spouse name: Not on file  . Number of children: 4  . Years of education: two years of college  . Highest education level: Not on file  Occupational History  . Occupation: retired    Tobacco Use  . Smoking status: Former Smoker    Packs/day: 1.00    Years: 19.00    Pack years: 19.00    Quit date: 2012    Years since quitting: 9.8  . Smokeless tobacco: Never Used  . Tobacco comment: quit 6 years ago  Vaping Use  . Vaping Use: Never used  Substance and Sexual Activity  . Alcohol use: Yes    Alcohol/week: 1.0 standard drink    Types: 1 Glasses of wine per week    Comment: twice a month  . Drug use: Never  . Sexual activity: Not Currently    Partners: Male  Other Topics Concern  . Not on file  Social History Narrative   Single. Lived in [place]  area, moved to [place] to be close to her daughters 2018. Lives with her daughter.   Some college, worked as Programmer, systems. Retired.    Social drinker. Former smoker.   Exercises routinely.    Wears dentures.    Drinks caffeine, takes a daily vitamin.   Smoke alarm in the home. Wears her seatbelt.    Feels safe in her relationships.   3-4 cups per day.   Four children (2 biological, 2 she raised as her own).   Social Determinants of Health   Financial Resource Strain: Low Risk   . Difficulty of Paying Living Expenses: Not hard at all  Food Insecurity: No Food Insecurity  . Worried About Charity fundraiser in the Last Year: Never true  . Ran Out of Food in the Last Year: Never true  Transportation Needs: No Transportation Needs  . Lack of Transportation (Medical): No  . Lack of Transportation (Non-Medical): No  Physical Activity: Sufficiently Active  . Days of Exercise per Week: 4 days  . Minutes of Exercise per Session: 40 min  Stress: No Stress Concern Present  . Feeling of Stress : Not at all  Social Connections: Moderately Isolated  . Frequency of Communication with Friends and Family: More than three times a week  . Frequency of Social Gatherings with Friends and Family: More than three times a week  . Attends Religious Services: Never  . Active Member of Clubs or Organizations: Yes  . Attends Theatre manager Meetings: More than 4 times per year  . Marital Status: Divorced    Tobacco Counseling Counseling given: Not Answered Comment: quit 6 years ago   Clinical Intake:  Pre-visit preparation completed: Yes  Pain : No/denies pain     Nutritional Status: BMI > 30  Obese Nutritional Risks: None Diabetes: No  How often do you need to have someone help you when you read instructions, pamphlets, or other written materials from your doctor or pharmacy?: 1 - Never What is the last grade level you completed in school?: college  Diabetic?No  Interpreter Needed?: No  Information entered by :: Caroleen Hamman LPN   Activities of Daily Living In your present state of health, do you have any difficulty performing the following activities: 08/06/2020  Hearing? N  Vision? N  Difficulty concentrating or making decisions? N  Walking or climbing stairs? N  Dressing or bathing? N  Doing errands, shopping? N  Preparing Food and eating ? N  Using the Toilet? N  In the past six months, have you accidently leaked urine? Y  Do you have problems with loss of bowel control? N  Managing your Medications? N  Managing your Finances? N  Housekeeping or managing your Housekeeping? N  Some recent data might be hidden    Patient Care Team: Ma Hillock, DO as PCP - General (Family Medicine) Sharlene Dory, MD as Referring Physician (Neurology) Tommi Rumps (Dentistry) Wallace Going (Orthodontics) Feliberto Harts R (Inactive) (Oral Surgery) Basuroski, Jacqulynn Cadet, MD as Referring Physician (Psychiatry)  Indicate any recent Medical Services you may have received from other than [facility] providers in the past year (date may be approximate).     Assessment:   This is a routine wellness examination for [place].  Hearing/Vision screen  Hearing Screening   125Hz  250Hz  500Hz  1000Hz  2000Hz  3000Hz  4000Hz  6000Hz  8000Hz   Right ear:           Left ear:           Comments: Mild  hearing loss  Vision  Screening Comments: Wears glasses Last eye exam-12/2019-Dr. Joya San  Dietary issues and exercise activities discussed: Current Exercise Habits: Home exercise routine, Type of exercise: walking, Time (Minutes): 40, Frequency (Times/Week): 4, Weekly Exercise (Minutes/Week): 160, Exercise limited by: None identified  Goals    . Patient Stated     Be more active by walking more & eat healthier      Depression Screen PHQ 2/9 Scores 08/06/2020 07/01/2020 03/05/2020 01/23/2020 07/17/2019 05/07/2019 08/28/2018  PHQ - 2 Score 0 0 0 2 0 0 0  PHQ- 9 Score - 0 1 12 - - 2    Fall Risk Fall Risk  08/06/2020 07/17/2019 05/07/2019 08/28/2018 01/16/2018  Falls in the past year? 0 0 0 0 Yes  Comment - - - - Jan 2016  Number falls in past yr: 0 0 - 0 1  Injury with Fall? 0 0 - 0 Yes  Risk for fall due to : - - - Impaired balance/gait;Mental status change History of fall(s)  Follow up Falls prevention discussed Falls prevention discussed Falls evaluation completed Falls evaluation completed;Falls prevention discussed Follow up appointment    Any stairs in or around the home? Yes  If so, are there any without handrails? No  Home free of loose throw rugs in walkways, pet beds, electrical cords, etc? Yes  Adequate lighting in your home to reduce risk of falls? Yes   ASSISTIVE DEVICES UTILIZED TO PREVENT FALLS:  Life alert? No  Use of a cane, walker or w/c? No  Grab bars in the bathroom? Yes  Shower chair or bench in shower? No  Elevated toilet seat or a handicapped toilet? No   TIMED UP AND GO:  Was the test performed? No . Phone visit   Cognitive Function:No cognitive impairment noted. MMSE - Mini Mental State Exam 12/19/2019 07/17/2019  Orientation to time 5 5  Orientation to Place 5 5  Registration 3 3  Attention/ Calculation 5 5  Recall 3 1  Language- name 2 objects 2 2  Language- repeat 1 1  Language- follow 3 step command 3 3  Language- read & follow direction 1 1  Write a sentence 1 1    Copy design 1 1  Total score 30 28     6CIT Screen 08/06/2020  What Year? 0 points  What month? 0 points  What time? 0 points  Count back from 20 0 points  Months in reverse 0 points  Repeat phrase 0 points  Total Score 0    Immunizations Immunization History  Administered Date(s) Administered  . Influenza, High Dose Seasonal PF 08/28/2018, 06/13/2019  . PFIZER SARS-COV-2 Vaccination 11/09/2019, 12/03/2019  . Pneumococcal Polysaccharide-23 08/28/2018  . Zoster Recombinat (Shingrix) 05/10/2019, 09/10/2019    TDAP status: Due, Education has been provided regarding the importance of this vaccine. Advised may receive this vaccine at local pharmacy or Health Dept. Aware to provide a copy of the vaccination record if obtained from local pharmacy or Health Dept. Verbalized acceptance and understanding.   Flu vaccine status: Due- Patient plans to get vaccine soon.   Pneumococcal vaccine status: Prevnar-13 due- Discuss with PCP at next visit.  Covid-19 vaccine status: Completed vaccines  Qualifies for Shingles Vaccine? No   Zostavax completed No   Shingrix Completed?: Yes  Screening Tests Health Maintenance  Topic Date Due  . TETANUS/TDAP  Never done  . PNA vac Low Risk Adult (2 of 2 - PCV13) 08/29/2019  . INFLUENZA VACCINE  05/18/2020  .  MAMMOGRAM  07/18/2021  . COLONOSCOPY  09/04/2022  . DEXA SCAN  Completed  . COVID-19 Vaccine  Completed  . Hepatitis C Screening  Completed    Health Maintenance  Health Maintenance Due  Topic Date Due  . TETANUS/TDAP  Never done  . PNA vac Low Risk Adult (2 of 2 - PCV13) 08/29/2019  . INFLUENZA VACCINE  05/18/2020    Colorectal cancer screening: Completed 09/05/2019. Repeat every 3 years   Mammogram status: Ordered 06/24/2020. Pt provided with contact info and advised to call to schedule appt.    Bone Density status: Completed 07/19/2019. Results reflect: Bone density results: OSTEOPENIA. Repeat every 2 years.  Lung Cancer  Screening: (Low Dose CT Chest recommended if Age 56-80 years, 30 pack-year currently smoking OR have quit w/in 15years.) does not qualify.     Additional Screening:  Hepatitis C Screening:Completed 05/07/2019  Vision Screening: Recommended annual ophthalmology exams for early detection of glaucoma and other disorders of the eye. Is the patient up to date with their annual eye exam?  Yes  Who is the provider or what is the name of the office in which the patient attends annual eye exams? Dr. Joya San   Dental Screening: Recommended annual dental exams for proper oral hygiene  Community Resource Referral / Chronic Care Management: CRR required this visit?  No   CCM required this visit?  No      Plan:     I have personally reviewed and noted the following in the patient's chart:   . Medical and social history . Use of alcohol, tobacco or illicit drugs  . Current medications and supplements . Functional ability and status . Nutritional status . Physical activity . Advanced directives . List of other physicians . Hospitalizations, surgeries, and ER visits in previous 12 months . Vitals . Screenings to include cognitive, depression, and falls . Referrals and appointments  In addition, I have reviewed and discussed with patient certain preventive protocols, quality metrics, and best practice recommendations. A written personalized care plan for preventive services as well as general preventive health recommendations were provided to patient.    Due to this being a telephonic visit, the after visit summary with patients personalized plan was offered to patient via mail or my-chart.  Patient would like to access on my-chart.   Marta Antu, LPN   [id number]/12/[id number]  Nurse Health  Advisor  Nurse Notes: None

## 2020-08-06 NOTE — Patient Instructions (Signed)
Abigail Michael , Thank you for taking time to complete your Medicare Wellness Visit. I appreciate your ongoing commitment to your health goals. Please review the following plan we discussed and let me know if I can assist you in the future.   Screening recommendations/referrals: Colonoscopy: Completed-09/05/2019-Due 09/04/2022 Mammogram: Ordered today. Someone will be calling you to schedule. Bone Density: Completed 07/19/2019- Due 07/18/2021 Recommended yearly ophthalmology/optometry visit for glaucoma screening and checkup Recommended yearly dental visit for hygiene and checkup  Vaccinations: Influenza vaccine: Due- May obtain vaccine at our office or your local pharmacy. Pneumococcal vaccine: MEQASTM-19 due- May obtain vaccine at our office or your local pharmacy. Tdap vaccine: Discuss with pharmacy Shingles vaccine: Completed vaccines   Covid-19:Completed vaccines  Advanced directives: Information mailed today.  Conditions/risks identified: See problem list  Next appointment: Follow up in one year for your annual wellness visit 08/12/21 @ 9:00am   Preventive Care 65 Years and Older, Female Preventive care refers to lifestyle choices and visits with your health care provider that can promote health and wellness. What does preventive care include?  A yearly physical exam. This is also called an annual well check.  Dental exams once or twice a year.  Routine eye exams. Ask your health care provider how often you should have your eyes checked.  Personal lifestyle choices, including:  Daily care of your teeth and gums.  Regular physical activity.  Eating a healthy diet.  Avoiding tobacco and drug use.  Limiting alcohol use.  Practicing safe sex.  Taking low-dose aspirin every day.  Taking vitamin and mineral supplements as recommended by your health care provider. What happens during an annual well check? The services and screenings done by your health care provider during  your annual well check will depend on your age, overall health, lifestyle risk factors, and family history of disease. Counseling  Your health care provider may ask you questions about your:  Alcohol use.  Tobacco use.  Drug use.  Emotional well-being.  Home and relationship well-being.  Sexual activity.  Eating habits.  History of falls.  Memory and ability to understand (cognition).  Work and work Statistician.  Reproductive health. Screening  You may have the following tests or measurements:  Height, weight, and BMI.  Blood pressure.  Lipid and cholesterol levels. These may be checked every 5 years, or more frequently if you are over 70 years old.  Skin check.  Lung cancer screening. You may have this screening every year starting at age 52 if you have a 30-pack-year history of smoking and currently smoke or have quit within the past 15 years.  Fecal occult blood test (FOBT) of the stool. You may have this test every year starting at age 15.  Flexible sigmoidoscopy or colonoscopy. You may have a sigmoidoscopy every 5 years or a colonoscopy every 10 years starting at age 36.  Hepatitis C blood test.  Hepatitis B blood test.  Sexually transmitted disease (STD) testing.  Diabetes screening. This is done by checking your blood sugar (glucose) after you have not eaten for a while (fasting). You may have this done every 1-3 years.  Bone density scan. This is done to screen for osteoporosis. You may have this done starting at age 11.  Mammogram. This may be done every 1-2 years. Talk to your health care provider about how often you should have regular mammograms. Talk with your health care provider about your test results, treatment options, and if necessary, the need for more tests. Vaccines  Your health care provider may recommend certain vaccines, such as:  Influenza vaccine. This is recommended every year.  Tetanus, diphtheria, and acellular pertussis (Tdap,  Td) vaccine. You may need a Td booster every 10 years.  Zoster vaccine. You may need this after age 11.  Pneumococcal 13-valent conjugate (PCV13) vaccine. One dose is recommended after age 59.  Pneumococcal polysaccharide (PPSV23) vaccine. One dose is recommended after age 83. Talk to your health care provider about which screenings and vaccines you need and how often you need them. This information is not intended to replace advice given to you by your health care provider. Make sure you discuss any questions you have with your health care provider. Document Released: 10/31/2015 Document Revised: 06/23/2016 Document Reviewed: 08/05/2015 Elsevier Interactive Patient Education  2017 [place] Prevention in the Home Falls can cause injuries. They can happen to people of all ages. There are many things you can do to make your home safe and to help prevent falls. What can I do on the outside of my home?  Regularly fix the edges of walkways and driveways and fix any cracks.  Remove anything that might make you trip as you walk through a door, such as a raised step or threshold.  Trim any bushes or trees on the path to your home.  Use bright outdoor lighting.  Clear any walking paths of anything that might make someone trip, such as rocks or tools.  Regularly check to see if handrails are loose or broken. Make sure that both sides of any steps have handrails.  Any raised decks and porches should have guardrails on the edges.  Have any leaves, snow, or ice cleared regularly.  Use sand or salt on walking paths during winter.  Clean up any spills in your garage right away. This includes oil or grease spills. What can I do in the bathroom?  Use night lights.  Install grab bars by the toilet and in the tub and shower. Do not use towel bars as grab bars.  Use non-skid mats or decals in the tub or shower.  If you need to sit down in the shower, use a plastic, non-slip  stool.  Keep the floor dry. Clean up any water that spills on the floor as soon as it happens.  Remove soap buildup in the tub or shower regularly.  Attach bath mats securely with double-sided non-slip rug tape.  Do not have throw rugs and other things on the floor that can make you trip. What can I do in the bedroom?  Use night lights.  Make sure that you have a light by your bed that is easy to reach.  Do not use any sheets or blankets that are too big for your bed. They should not hang down onto the floor.  Have a firm chair that has side arms. You can use this for support while you get dressed.  Do not have throw rugs and other things on the floor that can make you trip. What can I do in the kitchen?  Clean up any spills right away.  Avoid walking on wet floors.  Keep items that you use a lot in easy-to-reach places.  If you need to reach something above you, use a strong step stool that has a grab bar.  Keep electrical cords out of the way.  Do not use floor polish or wax that makes floors slippery. If you must use wax, use non-skid floor wax.  Do  not have throw rugs and other things on the floor that can make you trip. What can I do with my stairs?  Do not leave any items on the stairs.  Make sure that there are handrails on both sides of the stairs and use them. Fix handrails that are broken or loose. Make sure that handrails are as long as the stairways.  Check any carpeting to make sure that it is firmly attached to the stairs. Fix any carpet that is loose or worn.  Avoid having throw rugs at the top or bottom of the stairs. If you do have throw rugs, attach them to the floor with carpet tape.  Make sure that you have a light switch at the top of the stairs and the bottom of the stairs. If you do not have them, ask someone to add them for you. What else can I do to help prevent falls?  Wear shoes that:  Do not have high heels.  Have rubber bottoms.  Are  comfortable and fit you well.  Are closed at the toe. Do not wear sandals.  If you use a stepladder:  Make sure that it is fully opened. Do not climb a closed stepladder.  Make sure that both sides of the stepladder are locked into place.  Ask someone to hold it for you, if possible.  Clearly mark and make sure that you can see:  Any grab bars or handrails.  First and last steps.  Where the edge of each step is.  Use tools that help you move around (mobility aids) if they are needed. These include:  Canes.  Walkers.  Scooters.  Crutches.  Turn on the lights when you go into a dark area. Replace any light bulbs as soon as they burn out.  Set up your furniture so you have a clear path. Avoid moving your furniture around.  If any of your floors are uneven, fix them.  If there are any pets around you, be aware of where they are.  Review your medicines with your doctor. Some medicines can make you feel dizzy. This can increase your chance of falling. Ask your doctor what other things that you can do to help prevent falls. This information is not intended to replace advice given to you by your health care provider. Make sure you discuss any questions you have with your health care provider. Document Released: 07/31/2009 Document Revised: 03/11/2016 Document Reviewed: 11/08/2014 Elsevier Interactive Patient Education  2017 [employer].

## 2020-08-28 ENCOUNTER — Ambulatory Visit (INDEPENDENT_AMBULATORY_CARE_PROVIDER_SITE_OTHER): Payer: Medicare Other | Admitting: Psychology

## 2020-08-28 DIAGNOSIS — F419 Anxiety disorder, unspecified: Secondary | ICD-10-CM

## 2020-08-28 DIAGNOSIS — F329 Major depressive disorder, single episode, unspecified: Secondary | ICD-10-CM

## 2020-09-16 DIAGNOSIS — Z23 Encounter for immunization: Secondary | ICD-10-CM | POA: Diagnosis not present

## 2020-09-17 ENCOUNTER — Ambulatory Visit: Payer: Medicare Other | Admitting: Psychology

## 2020-09-28 ENCOUNTER — Other Ambulatory Visit: Payer: Self-pay | Admitting: Family Medicine

## 2020-09-30 ENCOUNTER — Ambulatory Visit (INDEPENDENT_AMBULATORY_CARE_PROVIDER_SITE_OTHER): Payer: Medicare Other | Admitting: Psychology

## 2020-09-30 DIAGNOSIS — F419 Anxiety disorder, unspecified: Secondary | ICD-10-CM | POA: Diagnosis not present

## 2020-09-30 DIAGNOSIS — F329 Major depressive disorder, single episode, unspecified: Secondary | ICD-10-CM

## 2020-10-14 ENCOUNTER — Ambulatory Visit
Admission: RE | Admit: 2020-10-14 | Discharge: 2020-10-14 | Disposition: A | Discharge status: Home / Self Care | Payer: Medicare Other | Source: Ambulatory Visit | Attending: Family Medicine | Admitting: Family Medicine

## 2020-10-14 ENCOUNTER — Other Ambulatory Visit: Payer: Self-pay

## 2020-10-14 DIAGNOSIS — Z1231 Encounter for screening mammogram for malignant neoplasm of breast: Secondary | ICD-10-CM

## 2020-10-21 ENCOUNTER — Ambulatory Visit: Payer: Medicare Other | Admitting: Psychology

## 2020-10-25 DIAGNOSIS — Z1152 Encounter for screening for COVID-19: Secondary | ICD-10-CM | POA: Diagnosis not present

## 2020-11-06 ENCOUNTER — Other Ambulatory Visit: Payer: Self-pay | Admitting: Family Medicine

## 2020-11-13 ENCOUNTER — Ambulatory Visit: Payer: Medicare Other | Admitting: Psychology

## 2020-12-01 ENCOUNTER — Other Ambulatory Visit: Payer: Self-pay

## 2020-12-01 ENCOUNTER — Telehealth: Payer: Self-pay | Admitting: Family Medicine

## 2020-12-01 NOTE — Telephone Encounter (Signed)
Ok to complete letter for her?  

## 2020-12-01 NOTE — Telephone Encounter (Signed)
Patient is looking for new apartment and is  requesting letter from Dr. Raoul Pitch stating it is medically necessary for her to be on the first floor due to balance issues.  Patient scheduled appt 12/02/20 for med refills and would like to get letter at that time.

## 2020-12-01 NOTE — Telephone Encounter (Signed)
FYI

## 2020-12-02 ENCOUNTER — Encounter: Payer: Self-pay | Admitting: Family Medicine

## 2020-12-02 ENCOUNTER — Ambulatory Visit (INDEPENDENT_AMBULATORY_CARE_PROVIDER_SITE_OTHER): Payer: Medicare Other | Admitting: Psychology

## 2020-12-02 ENCOUNTER — Ambulatory Visit (INDEPENDENT_AMBULATORY_CARE_PROVIDER_SITE_OTHER): Payer: Medicare Other | Admitting: Family Medicine

## 2020-12-02 ENCOUNTER — Other Ambulatory Visit: Payer: Self-pay

## 2020-12-02 VITALS — BP 113/74 | HR 63 | Temp 98.1°F | Ht 62.0 in | Wt 162.0 lb

## 2020-12-02 DIAGNOSIS — F418 Other specified anxiety disorders: Secondary | ICD-10-CM | POA: Diagnosis not present

## 2020-12-02 DIAGNOSIS — Z79899 Other long term (current) drug therapy: Secondary | ICD-10-CM | POA: Diagnosis not present

## 2020-12-02 DIAGNOSIS — Z23 Encounter for immunization: Secondary | ICD-10-CM

## 2020-12-02 DIAGNOSIS — K219 Gastro-esophageal reflux disease without esophagitis: Secondary | ICD-10-CM

## 2020-12-02 DIAGNOSIS — N3281 Overactive bladder: Secondary | ICD-10-CM

## 2020-12-02 DIAGNOSIS — G3281 Cerebellar ataxia in diseases classified elsewhere: Secondary | ICD-10-CM | POA: Diagnosis not present

## 2020-12-02 DIAGNOSIS — S069X9S Unspecified intracranial injury with loss of consciousness of unspecified duration, sequela: Secondary | ICD-10-CM

## 2020-12-02 DIAGNOSIS — E782 Mixed hyperlipidemia: Secondary | ICD-10-CM

## 2020-12-02 DIAGNOSIS — F329 Major depressive disorder, single episode, unspecified: Secondary | ICD-10-CM | POA: Diagnosis not present

## 2020-12-02 DIAGNOSIS — I10 Essential (primary) hypertension: Secondary | ICD-10-CM

## 2020-12-02 DIAGNOSIS — N1831 Chronic kidney disease, stage 3a: Secondary | ICD-10-CM | POA: Diagnosis not present

## 2020-12-02 DIAGNOSIS — E039 Hypothyroidism, unspecified: Secondary | ICD-10-CM | POA: Diagnosis not present

## 2020-12-02 DIAGNOSIS — Z5181 Encounter for therapeutic drug level monitoring: Secondary | ICD-10-CM | POA: Diagnosis not present

## 2020-12-02 DIAGNOSIS — F419 Anxiety disorder, unspecified: Secondary | ICD-10-CM | POA: Diagnosis not present

## 2020-12-02 DIAGNOSIS — I69311 Memory deficit following cerebral infarction: Secondary | ICD-10-CM | POA: Diagnosis not present

## 2020-12-02 MED ORDER — AMLODIPINE BESYLATE 10 MG PO TABS: 10.0000 mg | ORAL_TABLET | Freq: Every day | ORAL | 1 refills | Status: DC

## 2020-12-02 MED ORDER — BUPROPION HCL ER (SR) 150 MG PO TB12: 150.0000 mg | ORAL_TABLET | Freq: Two times a day (BID) | ORAL | 1 refills | Status: DC

## 2020-12-02 MED ORDER — MIRABEGRON ER 50 MG PO TB24
50.0000 mg | ORAL_TABLET | Freq: Every day | ORAL | 1 refills | Status: DC
Start: 2020-12-02 — End: 2020-12-02

## 2020-12-02 MED ORDER — ATORVASTATIN CALCIUM 40 MG PO TABS: 40.0000 mg | ORAL_TABLET | Freq: Every day | ORAL | 3 refills | Status: DC

## 2020-12-02 MED ORDER — MIRABEGRON ER 50 MG PO TB24
50.0000 mg | ORAL_TABLET | Freq: Every day | ORAL | 1 refills | Status: DC
Start: 2020-12-02 — End: 2021-06-09

## 2020-12-02 MED ORDER — ESOMEPRAZOLE MAGNESIUM 40 MG PO CPDR: 40.0000 mg | DELAYED_RELEASE_CAPSULE | Freq: Every day | ORAL | 1 refills | Status: DC

## 2020-12-02 MED ORDER — FLUOXETINE HCL 40 MG PO CAPS
40.0000 mg | ORAL_CAPSULE | Freq: Every day | ORAL | 1 refills | Status: DC
Start: 2020-12-02 — End: 2021-06-10

## 2020-12-02 MED ORDER — TETANUS-DIPHTH-ACELL PERTUSSIS 5-2.5-18.5 LF-MCG/0.5 IM SUSP: 0.5000 mL | Freq: Once | INTRAMUSCULAR | 0 refills | Status: AC

## 2020-12-02 MED ORDER — FAMOTIDINE 20 MG PO TABS: 20.0000 mg | ORAL_TABLET | Freq: Two times a day (BID) | ORAL | 3 refills | Status: DC

## 2020-12-02 NOTE — Progress Notes (Signed)
Patient ID: [place]ail Michael, female  DOB: 05/14/1951, 70 y.o.   MRN: [id number] Patient Care Team    Relationship Specialty Notifications Start End  Ma Hillock, DO PCP - General Family Medicine  01/16/18   Sharlene Dory, MD Referring Physician Neurology  08/28/18   Tommi Rumps  Dentistry  07/17/19   [name]  Orthodontics  07/17/19   Feliberto Harts R (Inactive)  Oral Surgery  07/17/19   Delanna Ahmadi, MD Referring Physician Psychiatry  07/17/19    Comment: Neurology    Chief Complaint  Patient presents with  . Follow-up    [facility]; pt is not fasting    Subjective: Abigail Michael is a 70 y.o.  female present for follow up Depression with anxiety Patient reports she is feeling great on Wellbutrin and prozac. She has an appointment with a therapist. She is feeling much better and she found a group online of people within her age bracket they get together and go out to eat, go bowling etc.  She states she feels like she is connected with a few people and is making friends. She is considering getting her own apartment near her daughter.  Prior note: Patient presents today to discuss her depression anxiety.  She reports she would like a referral to a therapist or psychologist to discuss her worsening anger.  She is prescribed Prozac 40 mg daily and has been doing well on this medication for a few years.  She states over the last few weeks she has noted she feels more lonely.  She is struggling working through the her feelings of "abandonment "and relocation/move to the [place] area.  She reports even though she is the one that had left [place] she still feels hurt and abandoned by the circumstances.  She has noted she has become more angry and irritable.  Her daughter told her she threw a tantrum the other day and she agrees she let her emotions get out of hand.  She reports she has been drinking more alcohol, which is not like her.  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  She is having more difficulty with attention and focus and this makes her upset.  When her emotions start to overwhelm her she tries to get out of the house and take a walk or work in the yard.  She has also tried to reach out and join social groups and most recently went bowling.  The past 6 months she has been in a relationship with a man that she has known for years and had been her friend prior.  She has enjoyed this relationship and had reported happiness with the relationship.  Today she endorses feeling guilty  because she knows this relationship is not what she needs and is not working because of the long distance between them.  She has had a few therapy sessions with Dr. [name] in the past.  She really liked Dr. [name] but did not feel a connection with her.  She would like a referral to a different therapist today. Prior note:  Has been on prozac for a little over a year after CVA. She reports she feels her depression and anxiety are worsening. She has had many life changes. Moved [place] last year to be close to family. Currently living with her daughter and enjoying being close to her family. Having trouble adjusting after retiring, moving and TBI  last year. She denies SI or HI. Tried in the past: celexa and effexor.   Essential hypertension/HLD/morbid obesity/h/o TIA: Pt reports compliance  with amlodipine 10 mg QD.Blood pressures ranges at home are within normal range.Patient denies chest pain, shortness of breath, dizziness or lower extremity edema.  Pt takes a  daily baby ASA. Pt is  prescribed statin. H/O TIA.  Diet: Low sodium Exercise: Patient reports she has become more active getting out walking more routinely and feeling better. RF: HTN, HLD, Obesity, h/o CVA, FHX MI  GERD/PPI:  Patient reports compliance with Nexium and Pepcid.    Vit d and b12 levels utd..  Hypothyroidism:  Patient reports compliance with  12.5 mcg levothyroxine daily.   Last thyroid panel completed 04/2019.  Overactive bladder/MS-white matter disease/TIA/TBI/ataxia: Patient reports compliance with her Myrbetriq and it is working well for her.  Pt reports compliance with aricpet 10 mg qd.   Prior note: Patient was established with neurology.  However today she states she would like to be referred to a new neurologist if possible.  She asked for refills on her Aricept and dalfampridine.  Explained to patient I be happy to refill her Aricept however dalfampridine is a medication that is prescribed by neurology.   Depression screen [facility] 2/9 08/06/2020 07/01/2020 03/05/2020 01/23/2020 07/17/2019  Decreased Interest 0 0 0 1 0  Down, Depressed, Hopeless 0 0 0 1 0  PHQ - 2 Score 0 0 0 2 0  Altered sleeping - 0 0 0 -  Tired, decreased energy - 0 0 3 -  Change in appetite - 0 0 3 -  Feeling bad or failure about yourself  - 0 0 2 -  Trouble concentrating - 0 1 1 -  Moving slowly or fidgety/restless - 0 0 0 -  Suicidal thoughts - 0 0 1 -  PHQ-9 Score - 0 1 12 -  Difficult doing work/chores - - Not difficult at all Not difficult at all -   GAD 7 : Generalized Anxiety Score 03/05/2020 01/23/2020 05/07/2019 08/28/2018  Nervous, Anxious, on Edge 0 3 0 2  Control/stop worrying 0 0 0 2  Worry too much - different things 0 1 0 2  Trouble relaxing 0 3 0 0  Restless 0 0 0 0  Easily annoyed or irritable 0 3 0 2  Afraid - awful might happen 0 0 0 0  Total GAD 7 Score 0 10 0 8  Anxiety Difficulty Not difficult at all Not difficult at all - Somewhat difficult       Fall Risk  08/06/2020 07/17/2019 05/07/2019 08/28/2018 01/16/2018  Falls in the past year? 0 0 0 0 Yes  Comment - - - - Jan 2016  Number falls in past yr: 0 0 - 0 1  Injury with Fall? 0 0 - 0 Yes  Risk for fall due to : - - - Impaired balance/gait;Mental status change History of fall(s)  Follow up Falls prevention discussed Falls prevention discussed Falls evaluation completed Falls  evaluation completed;Falls prevention discussed Follow up appointment     Immunization History  Administered Date(s) Administered  . Fluad Quad(high Dose 65+) 09/16/2020  . Influenza, High Dose Seasonal PF 08/28/2018, 06/13/2019  . PFIZER(Purple Top)SARS-COV-2 Vaccination 11/09/2019, 12/03/2019, 09/16/2020  . Pneumococcal Conjugate-13 12/02/2020  . Pneumococcal Polysaccharide-23 08/28/2018  . Zoster Recombinat (Shingrix) 05/10/2019, 09/10/2019    No exam data present  Past Medical History:  Diagnosis Date  . Acid reflux   . Brain injury ([place]) 2015  Due to a fall   . 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 Chicken pox   . Constipation   . Depression   . Frequent headaches   . GERD (gastroesophageal reflux disease)   . 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 Hyperlipidemia   . Hypertension   . MS (multiple sclerosis) ([place]) 12/15/2016   possible dx. many MRI w/ positive white matter abnormality. MRI reported stable since 2015. mild progressive  Cognitive loss  . Obstructive sleep apnea    on prior neuro notes 10/2016; no results.   . Osteoporosis    had been on reclast  . Post concussion syndrome 10/2013   ongoing issues with concentration, memory loss, lability; EEG completed and normal.   . TBI (traumatic brain injury) ([place]) 2014   fall at [facility], hit head off of floor. No bleed, "concussion". Seen neurology since for focus and word finding.   Marland Kitchen TIA (transient ischemic attack) 11/30/2016   MRI normal. No lateralization. Speech affected and weakness during a hypotensive episdoe.   . Urinary incontinence    Allergies  Allergen Reactions  . Bee Venom Anaphylaxis  . Amoxicillin Dermatitis    Yeast infection   Past Surgical History:  Procedure Laterality Date  . BACK SURGERY  02/2015   herniated disc L4/5/6  . BREAST BIOPSY  1988   biopsy  . COLONOSCOPY     [place]. Around 2015  . ESOPHAGOGASTRODUODENOSCOPY  2016    [place]  . LAPAROSCOPIC HYSTERECTOMY     partial   . SHOULDER ARTHROSCOPY W/ LABRAL REPAIR Left 02/13/2016   left shoulder  . Study: cognitive eval  06/23/2016   mild cog-linguistic impairment, difficulty with verbal fluency  . Study: EEG   04/12/2015   Studies: EEG completed and normal.   . Study: MRI  04/28/2016   Impression: numerous non-enhancing flair hyperintense lesions w/in the brain .Suspicious for demyelinating disease such as lyme or MS. Her Lyme test were negative.    Family History  Problem Relation Age of Onset  . Lung cancer Mother   . Alcohol abuse Father   . Throat cancer Father   . Lung cancer Father   . Depression Father   . Heart attack Father   . Breast cancer Sister   . Alcohol abuse Brother   . Diabetes Brother   . Mental illness Sister   . Stroke Daughter   . Endometriosis Daughter   . Heart disease Son   . Breast cancer Maternal Aunt   . Diabetes Maternal Grandmother   . GER disease Daughter   . Esophageal cancer Paternal Grandfather   . Colon cancer Neg Hx   . Rectal cancer Neg Hx   . Stomach cancer Neg Hx    Social History   Socioeconomic History  . Marital status: Single    Spouse name: Not on file  . Number of children: 4  . Years of education: two years of college  . Highest education level: Not on file  Occupational History  . Occupation: retired  Tobacco Use  . Smoking status: Former Smoker    Packs/day: 1.00    Years: 19.00    Pack years: 19.00    Quit date: 2012    Years since quitting: 10.1  . Smokeless tobacco: Never Used  . Tobacco comment: quit 6 years ago  Vaping Use  . Vaping Use: Never used  Substance and Sexual Activity  . Alcohol use: Yes  Alcohol/week: 1.0 standard drink    Types: 1 Glasses of wine per week    Comment: twice a month  . Drug use: Never  . Sexual activity: Not Currently    Partners: Male  Other Topics Concern  . Not on file  Social History Narrative   Single. Lived in [place] area,  moved to [place] to be close to her daughters 2018. Lives with her daughter.   Some college, worked as Programmer, systems. Retired.    Social drinker. Former smoker.   Exercises routinely.    Wears dentures.    Drinks caffeine, takes a daily vitamin.   Smoke alarm in the home. Wears her seatbelt.    Feels safe in her relationships.   3-4 cups per day.   Four children (2 biological, 2 she raised as her own).   Social Determinants of Health   Financial Resource Strain: Low Risk   . Difficulty of Paying Living Expenses: Not hard at all  Food Insecurity: No Food Insecurity  . Worried About Charity fundraiser in the Last Year: Never true  . Ran Out of Food in the Last Year: Never true  Transportation Needs: No Transportation Needs  . Lack of Transportation (Medical): No  . Lack of Transportation (Non-Medical): No  Physical Activity: Sufficiently Active  . Days of Exercise per Week: 4 days  . Minutes of Exercise per Session: 40 min  Stress: No Stress Concern Present  . Feeling of Stress : Not at all  Social Connections: Moderately Isolated  . Frequency of Communication with Friends and Family: More than three times a week  . Frequency of Social Gatherings with Friends and Family: More than three times a week  . Attends Religious Services: Never  . Active Member of Clubs or Organizations: Yes  . Attends Archivist Meetings: More than 4 times per year  . Marital Status: Divorced  Human resources officer Violence: Not At Risk  . Fear of Current or Ex-Partner: No  . Emotionally Abused: No  . Physically Abused: No  . Sexually Abused: No   Allergies as of 12/02/2020      Reactions   Bee Venom Anaphylaxis   Amoxicillin Dermatitis   Yeast infection      Medication List       Accurate as of December 02, 2020  3:27 PM. If you have any questions, ask your nurse or doctor.        amLODipine 10 MG tablet Commonly known as: NORVASC Take 1 tablet (10 mg total) by mouth daily.   aspirin EC  81 MG tablet Take 81 mg by mouth daily.   atorvastatin 40 MG tablet Commonly known as: LIPITOR Take 1 tablet (40 mg total) by mouth daily.   buPROPion 150 MG 12 hr tablet Commonly known as: Wellbutrin SR Take 1 tablet (150 mg total) by mouth 2 (two) times daily.   donepezil 10 MG tablet Commonly known as: ARICEPT Take 1 tablet (10 mg total) by mouth at bedtime.   EPINEPHrine 0.3 mg/0.3 mL Soaj injection Commonly known as: EPI-PEN Inject 0.3 mLs (0.3 mg total) into the skin as needed.   esomeprazole 40 MG capsule Commonly known as: NEXIUM Take 1 capsule (40 mg total) by mouth daily at 12 noon.   famotidine 20 MG tablet Commonly known as: PEPCID Take 1 tablet (20 mg total) by mouth 2 (two) times daily.   FLUoxetine 40 MG capsule Commonly known as: PROZAC Take 1 capsule (40 mg total) by mouth  daily.   levothyroxine 25 MCG tablet Commonly known as: SYNTHROID TAKE 1/2 TABLET BY MOUTH BEFORE BREAKFAST.   mirabegron ER 50 MG Tb24 tablet Commonly known as: Myrbetriq Take 1 tablet (50 mg total) by mouth daily. What changed: how much to take Changed by: Howard Pouch, DO   Tdap 5-2.5-18.5 LF-MCG/0.5 injection Commonly known as: BOOSTRIX Inject 0.5 mLs into the muscle once for 1 dose. Started by: Howard Pouch, DO   TURMERIC PO Take by mouth.       All past medical history, surgical history, allergies, family history, immunizations andmedications were updated in the EMR today and reviewed under the history and medication portions of their EMR.     No results found for this or any previous visit (from the past 2160 hour(s)).  Patient was never admitted.   ROS: 14 pt review of systems performed and negative (unless mentioned in an HPI)  Objective: BP 113/74   Pulse 63   Temp 98.1 F (36.7 C) (Oral)   Ht '5\' 2"'  (1.575 m)   Wt 162 lb (73.5 kg)   LMP 10/18/1990   SpO2 98%   BMI 29.63 kg/m  Gen: Afebrile. No acute distress. Nontoxic. Very pleasant female.  HENT:  AT. [place].  Eyes:Pupils Equal Round Reactive to light, Extraocular movements intact,  Conjunctiva without redness, discharge or icterus. Neck/lymp/endocrine: Supple,no lymphadenopathy, no thyromegaly CV: RRR no murmur, no edema, +2/4 P posterior tibialis pulses Chest: CTAB, no wheeze or crackles Abd: Soft. NTND. BS present. no Masses palpated.  Skin: no rashes, purpura or petechiae.  Neuro:  Normal gait. PERLA. EOMi. Alert. Oriented x3 Psych: Normal affect, dress and demeanor. Normal speech. Normal thought content and judgment.   Assessment/plan: Clemmie B[place]na is a 70 y.o. female present for establishment.  Depression with anxiety/memory-focus deficits -stable.  - Continue Prozac 40 mg daily - continue  Wellbutrin 150 mg twice daily - Referral to Myra Gianotti for therapy was placed last visit and she has her establishment appointment coming up at the end of this month. - Tried in the past: celexa and effexor.  - Follow-up in 5.5 months.  Essential hypertension/morbid obesity/HLD Stable.   Continue   amlodipine 10 mg daily to [facility]. Continue  atorvastatin 40 mg daily, refills called into [facility]. - continue statin and ASA - cbc, cmp, ldl and tsh collected today F/u 5.5 mos  Traumatic brain injury with loss of consciousness, sequela (HCC)/White matter disease/TIA/overactive bladder/ataxia -stable - Continue  Aricept 10 mg qd -continue  Myrbetriq 50 mg daily.  Completed work form for her today to allow her to be placed in 1st floor apartment.  She worked with physical therapy and did really well with improving her gait. Patient is established with neurology.  Encounter for monitoring long-term proton pump inhibitor therapy/GERD Stable.  continue omeprazole. Continue Pepcid.  pneumonia vaccine administered today.   Return in about 5 months (around 05/18/2021) for [facility] (30 min).   Orders Placed This Encounter  Procedures  . Pneumococcal conjugate vaccine 13-valent IM  . CBC   . Comp Met (CMET)  . TSH  . Direct LDL   Meds ordered this encounter  Medications  . Tdap (BOOSTRIX) 5-2.5-18.5 LF-MCG/0.5 injection    Sig: Inject 0.5 mLs into the muscle once for 1 dose.    Dispense:  0.5 mL    Refill:  0  . DISCONTD: mirabegron ER (MYRBETRIQ) 50 MG TB24 tablet    Sig: Take 1 tablet (50 mg total) by mouth daily.  Dispense:  90 tablet    Refill:  1  . FLUoxetine (PROZAC) 40 MG capsule    Sig: Take 1 capsule (40 mg total) by mouth daily.    Dispense:  90 capsule    Refill:  1  . famotidine (PEPCID) 20 MG tablet    Sig: Take 1 tablet (20 mg total) by mouth 2 (two) times daily.    Dispense:  180 tablet    Refill:  3  . esomeprazole (NEXIUM) 40 MG capsule    Sig: Take 1 capsule (40 mg total) by mouth daily at 12 noon.    Dispense:  90 capsule    Refill:  1  . buPROPion (WELLBUTRIN SR) 150 MG 12 hr tablet    Sig: Take 1 tablet (150 mg total) by mouth 2 (two) times daily.    Dispense:  180 tablet    Refill:  1  . atorvastatin (LIPITOR) 40 MG tablet    Sig: Take 1 tablet (40 mg total) by mouth daily.    Dispense:  90 tablet    Refill:  3  . amLODipine (NORVASC) 10 MG tablet    Sig: Take 1 tablet (10 mg total) by mouth daily.    Dispense:  90 tablet    Refill:  1  . mirabegron ER (MYRBETRIQ) 50 MG TB24 tablet    Sig: Take 1 tablet (50 mg total) by mouth daily.    Dispense:  90 tablet    Refill:  1   Referral Orders  No referral(s) requested today     Note is dictated utilizing voice recognition software. Although note has been proof read prior to signing, occasional typographical errors still can be missed. If any questions arise, please do not hesitate to call for verification.  Electronically signed by: Howard Pouch, DO [place]

## 2020-12-02 NOTE — Patient Instructions (Signed)
Great to see you today.  I have refilled your meds for you.  You look great.  We will call you with lab results.   Good luck with the new apartment.   Next appt in 5.5 months.

## 2020-12-04 ENCOUNTER — Telehealth: Payer: Self-pay | Admitting: Family Medicine

## 2020-12-04 LAB — CBC
HCT: 44.2 % (ref 35.0–45.0)
Hemoglobin: 15 g/dL (ref 11.7–15.5)
MCH: 29.6 pg (ref 27.0–33.0)
MCHC: 33.9 g/dL (ref 32.0–36.0)
MCV: 87.2 fL (ref 80.0–100.0)
MPV: 10.6 fL (ref 7.5–12.5)
Platelets: 245 10*3/uL (ref 140–400)
RBC: 5.07 10*6/uL (ref 3.80–5.10)
RDW: 12.3 % (ref 11.0–15.0)
WBC: 6.2 10*3/uL (ref 3.8–10.8)

## 2020-12-04 LAB — COMPREHENSIVE METABOLIC PANEL
AG Ratio: 1.9 (calc) (ref 1.0–2.5)
ALT: 14 U/L (ref 6–29)
AST: 12 U/L (ref 10–35)
Albumin: 4.5 g/dL (ref 3.6–5.1)
Alkaline phosphatase (APISO): 84 U/L (ref 37–153)
BUN/Creatinine Ratio: 17 (calc) (ref 6–22)
BUN: 16 mg/dL (ref 7–25)
CO2: 26 mmol/L (ref 20–32)
Calcium: 11.1 mg/dL — ABNORMAL HIGH (ref 8.6–10.4)
Chloride: 104 mmol/L (ref 98–110)
Creat: 0.94 mg/dL — ABNORMAL HIGH (ref 0.60–0.93)
Globulin: 2.4 g/dL (calc) (ref 1.9–3.7)
Glucose, Bld: 91 mg/dL (ref 65–99)
Potassium: 4.2 mmol/L (ref 3.5–5.3)
Sodium: 139 mmol/L (ref 135–146)
Total Bilirubin: 0.4 mg/dL (ref 0.2–1.2)
Total Protein: 6.9 g/dL (ref 6.1–8.1)

## 2020-12-04 LAB — LDL CHOLESTEROL, DIRECT: Direct LDL: 68 mg/dL (ref <100)

## 2020-12-04 LAB — TSH: TSH: 3.59 mIU/L (ref 0.40–4.50)

## 2020-12-04 MED ORDER — LEVOTHYROXINE SODIUM 25 MCG PO TABS: 25.0000 ug | ORAL_TABLET | Freq: Every day | ORAL | 3 refills | Status: DC

## 2020-12-04 NOTE — Telephone Encounter (Signed)
Spoke with pt regarding labs and instructions.   

## 2020-12-04 NOTE — Telephone Encounter (Signed)
Please call patient Liver, kidney and thyroid function are normal. Thyroid is high end of normal so I increased her thyroid med to one tab daily.  Blood cell counts normal. electrolytes are normal, with the exception of her calcium is elevated.  She has a history of intermittent elevated calcium.  Encouraged her to avoid high calcium content meals and increase her water consumption- 80- 100 ounces a day for with h/o elevated calcium this is very important for her to maintain.     Symptoms she may experience if her calcium gets high are:       -abdomen pain or bone pain      - fatigue, loss of appetite, thirst, constipation, nausea, excess urination, mental, confusion, muscle weakness, or weight loss.

## 2020-12-23 ENCOUNTER — Ambulatory Visit: Payer: Medicare Other | Admitting: Psychology

## 2020-12-29 ENCOUNTER — Ambulatory Visit: Payer: Medicare Other | Admitting: Neurology

## 2021-01-01 ENCOUNTER — Ambulatory Visit: Payer: Medicare Other | Admitting: Psychology

## 2021-01-05 NOTE — Progress Notes (Signed)
PATIENT: Abigail Michael DOB: 19-Dec-1950  REASON FOR VISIT: follow up HISTORY FROM: patient  HISTORY OF PRESENT ILLNESS: Today 01/06/21 [name] is a 70 year old female with history of memory disturbance.  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  She is on low-dose aspirin.  Has had work-up for MS, felt not the case.  Claims her memory issues started from a fall in 2015.  Is on Aricept, tolerating higher dose, often forgets to take it, does have vivid dreams. MMSE 26/30. Feels the Aricept helps her focus better. When driving, never listens to music, focuses only on driving. Can lose train of thought if interrupted. Is going to CST (craniosacral therapy), doing much better with this, her speed with processing and activities is better. Manges her own ADLS, finances. She likes to dance, line dancing, signed up for some new dance classes, can be harder to remember the steps. No major health issues. Here today alone. Completed PT for balance, improvement, did speech therapy, helped for word finding. She keeps comparing herself to how she was pre-2015, gets emotional. She is planning on getting her own apartment, needs this for her confidence, living with her daughter now.   HISTORY 06/30/2020 Dr. Jannifer Franklin: [name] is a 70 year old right-handed white female with a history of a concussion from a fall in 2015, she claims that her cognitive status has not been the same since that time.  She believes that her memory has gradually worsened over time.  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  She has had a carotid Doppler study and 2D echocardiogram done in 2018 before moving to this area from [place].  The studies were unremarkable.  The patient is on low-dose aspirin.  She continues to function independently, she drives a car and uses GPS  to get around, she is able to pay her bills and keep up with medications and appointments.  She is sleeping well at night and has a good energy level during the day.  She denies any severe gait instability problems, she sometimes feels as if she is drifting to the left and she occasionally may catch her right toe with walking.  She is no longer on Ampyra, this made no difference in her walking when the medication was discontinued.  She returns to this office for an evaluation.   REVIEW OF SYSTEMS: Out of a complete 14 system review of symptoms, the patient complains only of the following symptoms, and all other reviewed systems are negative.  Memory loss  ALLERGIES: Allergies  Allergen Reactions  . Bee Venom Anaphylaxis  . Amoxicillin Dermatitis    Yeast infection    HOME MEDICATIONS: Outpatient Medications Prior to Visit  Medication Sig Dispense Refill  . amLODipine (NORVASC) 10 MG tablet Take 1 tablet (10 mg total) by mouth daily. 90 tablet 1  . aspirin EC 81 MG tablet Take 81 mg by mouth daily.     Marland Kitchen atorvastatin (LIPITOR) 40 MG tablet Take 1 tablet (40 mg total) by mouth daily. 90 tablet 3  . buPROPion (WELLBUTRIN SR) 150 MG 12 hr tablet Take 1 tablet (150 mg total) by mouth 2 (two) times daily. 180 tablet 1  . donepezil (ARICEPT) 10 MG tablet Take 1 tablet (10 mg total) by mouth at bedtime. 90 tablet 3  . EPINEPHrine 0.3  mg/0.3 mL IJ SOAJ injection Inject 0.3 mLs (0.3 mg total) into the skin as needed. 1 each 1  . esomeprazole (NEXIUM) 40 MG capsule Take 1 capsule (40 mg total) by mouth daily at 12 noon. 90 capsule 1  . famotidine (PEPCID) 20 MG tablet Take 1 tablet (20 mg total) by mouth 2 (two) times daily. 180 tablet 3  . FLUoxetine (PROZAC) 40 MG capsule Take 1 capsule (40 mg total) by mouth daily. 90 capsule 1  . levothyroxine (SYNTHROID) 25 MCG tablet Take 1 tablet (25 mcg total) by mouth daily before breakfast. On an empty stomach 90 tablet 3  . mirabegron ER (MYRBETRIQ) 50 MG  TB24 tablet Take 1 tablet (50 mg total) by mouth daily. 90 tablet 1  . TURMERIC PO Take by mouth.      No facility-administered medications prior to visit.    PAST MEDICAL HISTORY: Past Medical History:  Diagnosis Date  . Acid reflux   . Brain injury ([place]) 2015   Due to a fall   . 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 Chicken pox   . Constipation   . Depression   . Frequent headaches   . GERD (gastroesophageal reflux disease)   . 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 Hyperlipidemia   . Hypertension   . MS (multiple sclerosis) ([place]) 12/15/2016   possible dx. many MRI w/ positive white matter abnormality. MRI reported stable since 2015. mild progressive  Cognitive loss  . Obstructive sleep apnea    on prior neuro notes 10/2016; no results.   . Osteoporosis    had been on reclast  . Post concussion syndrome 10/2013   ongoing issues with concentration, memory loss, lability; EEG completed and normal.   . TBI (traumatic brain injury) ([place]) 2014   fall at [facility], hit head off of floor. No bleed, "concussion". Seen neurology since for focus and word finding.   Marland Kitchen TIA (transient ischemic attack) 11/30/2016   MRI normal. No lateralization. Speech affected and weakness during a hypotensive episdoe.   . Urinary incontinence     PAST SURGICAL HISTORY: Past Surgical History:  Procedure Laterality Date  . BACK SURGERY  02/2015   herniated disc L4/5/6  . BREAST BIOPSY  1988   biopsy  . COLONOSCOPY     [place] Around 2015  . ESOPHAGOGASTRODUODENOSCOPY  2016   [place]  . LAPAROSCOPIC HYSTERECTOMY     partial   . SHOULDER ARTHROSCOPY W/ LABRAL REPAIR Left 02/13/2016   left shoulder  . Study: cognitive eval  06/23/2016   mild cog-linguistic impairment, difficulty with verbal fluency  . Study: EEG   04/12/2015   Studies: EEG completed and normal.   . Study: MRI  04/28/2016   Impression: numerous non-enhancing flair hyperintense  lesions w/in the brain .Suspicious for demyelinating disease such as lyme or MS. Her Lyme test were negative.     FAMILY HISTORY: Family History  Problem Relation Age of Onset  . Lung cancer Mother   . Alcohol abuse Father   . Throat cancer Father   . Lung cancer Father   . Depression Father   . Heart attack Father   . Breast cancer Sister   . Alcohol abuse Brother   . Diabetes Brother   . Mental illness Sister   . Stroke Daughter   . Endometriosis Daughter   . Heart disease Son   . Breast cancer Maternal Aunt   . Diabetes  Maternal Grandmother   . GER disease Daughter   . Esophageal cancer Paternal Grandfather   . Colon cancer Neg Hx   . Rectal cancer Neg Hx   . Stomach cancer Neg Hx     SOCIAL HISTORY: Social History   Socioeconomic History  . Marital status: Single    Spouse name: Not on file  . Number of children: 4  . Years of education: two years of college  . Highest education level: Not on file  Occupational History  . Occupation: retired  Tobacco Use  . Smoking status: Former Smoker    Packs/day: 1.00    Years: 19.00    Pack years: 19.00    Quit date: 2012    Years since quitting: 10.2  . Smokeless tobacco: Never Used  . Tobacco comment: quit 6 years ago  Vaping Use  . Vaping Use: Never used  Substance and Sexual Activity  . Alcohol use: Yes    Alcohol/week: 1.0 standard drink    Types: 1 Glasses of wine per week    Comment: twice a month  . Drug use: Never  . Sexual activity: Not Currently    Partners: Male  Other Topics Concern  . Not on file  Social History Narrative   Single. Lived in [place] area, moved to [place] to be close to her daughters 2018. Lives with her daughter.   Some college, worked as Programmer, systems. Retired.    Social drinker. Former smoker.   Exercises routinely.    Wears dentures.    Drinks caffeine, takes a daily vitamin.   Smoke alarm in the home. Wears her seatbelt.    Feels safe in her relationships.   3-4 cups per day.    Four children (2 biological, 2 she raised as her own).   Social Determinants of Health   Financial Resource Strain: Low Risk   . Difficulty of Paying Living Expenses: Not hard at all  Food Insecurity: No Food Insecurity  . Worried About Charity fundraiser in the Last Year: Never true  . Ran Out of Food in the Last Year: Never true  Transportation Needs: No Transportation Needs  . Lack of Transportation (Medical): No  . Lack of Transportation (Non-Medical): No  Physical Activity: Sufficiently Active  . Days of Exercise per Week: 4 days  . Minutes of Exercise per Session: 40 min  Stress: No Stress Concern Present  . Feeling of Stress : Not at all  Social Connections: Moderately Isolated  . Frequency of Communication with Friends and Family: More than three times a week  . Frequency of Social Gatherings with Friends and Family: More than three times a week  . Attends Religious Services: Never  . Active Member of Clubs or Organizations: Yes  . Attends Archivist Meetings: More than 4 times per year  . Marital Status: Divorced  Human resources officer Violence: Not At Risk  . Fear of Current or Ex-Partner: No  . Emotionally Abused: No  . Physically Abused: No  . Sexually Abused: No   PHYSICAL EXAM  Vitals:   01/06/21 0843  BP: (!) 142/81  Pulse: 92  Weight: 156 lb (70.8 kg)  Height: 5\' 2"  (1.575 m)   Body mass index is 28.53 kg/m.  Generalized: Well developed, in no acute distress  MMSE - Mini Mental State Exam 01/06/2021 12/19/2019 07/17/2019  Orientation to time 5 5 5   Orientation to Place 5 5 5   Registration 3 3 3   Attention/ Calculation 1 5  5  Recall 3 3 1   Language- name 2 objects 2 2 2   Language- repeat 1 1 1   Language- follow 3 step command 3 3 3   Language- read & follow direction 1 1 1   Write a sentence 1 1 1   Copy design 1 1 1   Total score 26 30 28     Neurological examination  Mentation: Alert oriented to time, place, history taking. Follows all commands  speech and language fluent Cranial nerve II-XII: Pupils were equal round reactive to light. Extraocular movements were full, visual field were full on confrontational test. Facial sensation and strength were normal. Head turning and shoulder shrug  were normal and symmetric. Motor: The motor testing reveals 5 over 5 strength of all 4 extremities. Good symmetric motor tone is noted throughout.  Sensory: Sensory testing is intact to soft touch on all 4 extremities. No evidence of extinction is noted.  Coordination: Cerebellar testing reveals good finger-nose-finger and heel-to-shin bilaterally.  Gait and station: Gait is normal. Tandem gait is normal. Romberg is negative. No drift is seen.  Reflexes: Deep tendon reflexes are symmetric and normal bilaterally.   DIAGNOSTIC DATA (LABS, IMAGING, TESTING) - I reviewed patient records, labs, notes, testing and imaging myself where available.  Lab Results  Component Value Date   WBC 6.2 12/02/2020   HGB 15.0 12/02/2020   HCT 44.2 12/02/2020   MCV 87.2 12/02/2020   PLT 245 12/02/2020      Component Value Date/Time   NA 139 12/02/2020 1516   NA 140 06/07/2017 0000   K 4.2 12/02/2020 1516   CL 104 12/02/2020 1516   CO2 26 12/02/2020 1516   GLUCOSE 91 12/02/2020 1516   BUN 16 12/02/2020 1516   BUN 16 06/07/2017 0000   CREATININE 0.94 (H) 12/02/2020 1516   CALCIUM 11.1 (H) 12/02/2020 1516   PROT 6.9 12/02/2020 1516   ALBUMIN 4.7 05/07/2019 0938   AST 12 12/02/2020 1516   ALT 14 12/02/2020 1516   ALKPHOS 108 05/07/2019 0938   BILITOT 0.4 12/02/2020 1516   Lab Results  Component Value Date   CHOL 174 10/08/2019   HDL 87.30 10/08/2019   LDLCALC 78 10/08/2019   LDLDIRECT 68 12/02/2020   TRIG 48.0 10/08/2019   CHOLHDL 2 10/08/2019   Lab Results  Component Value Date   HGBA1C 5.6 08/28/2018   Lab Results  Component Value Date   [id number] 790 08/28/2018   Lab Results  Component Value Date   TSH 3.59 12/02/2020   ASSESSMENT AND  PLAN 70 y.o. 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 here with:  1.  Mild memory problems, mild cognitive impairment  2.  Small vessel disease by MRI brain  3.  History of concussion  -MMSE 26/30 today, overall feels her memory and cognition has improved -Switch Aricept from taking at bedtime to AM, due to vivid dreams, forgetting to take at night  -Remains on aspirin 81 mg daily -Continue routine follow-up with PCP, follow-up in our office in 1 year or sooner if needed  I spent 30 minutes of face-to-face and non-face-to-face time with patient.  This included previsit chart review, lab review,  study review, order entry, electronic health record documentation, patient education.  Butler Denmark, AGNP-C, DNP 01/06/2021, 8:53 AM [facility] [address], [place], [place] [address] [phone]

## 2021-01-06 ENCOUNTER — Ambulatory Visit (INDEPENDENT_AMBULATORY_CARE_PROVIDER_SITE_OTHER): Payer: Medicare Other | Admitting: Neurology

## 2021-01-06 ENCOUNTER — Encounter: Payer: Self-pay | Admitting: Neurology

## 2021-01-06 VITALS — BP 142/81 | HR 92 | Ht 62.0 in | Wt 156.0 lb

## 2021-01-06 DIAGNOSIS — I69311 Memory deficit following cerebral infarction: Secondary | ICD-10-CM | POA: Diagnosis not present

## 2021-01-06 DIAGNOSIS — R9082 White matter disease, unspecified: Secondary | ICD-10-CM | POA: Diagnosis not present

## 2021-01-06 MED ORDER — DONEPEZIL HCL 10 MG PO TABS: 10.0000 mg | ORAL_TABLET | Freq: Every day | ORAL | 3 refills | Status: DC

## 2021-01-06 NOTE — Progress Notes (Signed)
I have read the note, and I agree with the clinical assessment and plan.  Abigail Michael K Amauri Keefe   

## 2021-01-06 NOTE — Patient Instructions (Signed)
Switch the Aricept to taking in the morning Continue your activity Continue to see your primary care doctor See you back in 1 year

## 2021-01-13 ENCOUNTER — Ambulatory Visit (INDEPENDENT_AMBULATORY_CARE_PROVIDER_SITE_OTHER): Payer: Medicare Other | Admitting: Psychology

## 2021-01-13 DIAGNOSIS — F419 Anxiety disorder, unspecified: Secondary | ICD-10-CM | POA: Diagnosis not present

## 2021-01-13 DIAGNOSIS — F329 Major depressive disorder, single episode, unspecified: Secondary | ICD-10-CM

## 2021-02-03 ENCOUNTER — Ambulatory Visit (INDEPENDENT_AMBULATORY_CARE_PROVIDER_SITE_OTHER): Payer: Medicare Other | Admitting: Psychology

## 2021-02-03 DIAGNOSIS — F329 Major depressive disorder, single episode, unspecified: Secondary | ICD-10-CM

## 2021-02-03 DIAGNOSIS — F419 Anxiety disorder, unspecified: Secondary | ICD-10-CM | POA: Diagnosis not present

## 2021-02-24 ENCOUNTER — Ambulatory Visit (INDEPENDENT_AMBULATORY_CARE_PROVIDER_SITE_OTHER): Payer: Medicare Other | Admitting: Psychology

## 2021-02-24 DIAGNOSIS — F419 Anxiety disorder, unspecified: Secondary | ICD-10-CM

## 2021-02-24 DIAGNOSIS — F329 Major depressive disorder, single episode, unspecified: Secondary | ICD-10-CM

## 2021-05-20 ENCOUNTER — Encounter: Payer: Self-pay | Admitting: Family Medicine

## 2021-05-20 NOTE — Telephone Encounter (Signed)
Please advise on vitamin in attachment

## 2021-05-27 ENCOUNTER — Other Ambulatory Visit: Payer: Self-pay | Admitting: Family Medicine

## 2021-06-08 ENCOUNTER — Other Ambulatory Visit: Payer: Self-pay | Admitting: Family Medicine

## 2021-06-09 ENCOUNTER — Encounter: Payer: Self-pay | Admitting: Family Medicine

## 2021-06-10 MED ORDER — FLUOXETINE HCL 40 MG PO CAPS: 40.0000 mg | ORAL_CAPSULE | Freq: Every day | ORAL | 0 refills | Status: DC

## 2021-06-13 ENCOUNTER — Other Ambulatory Visit: Payer: Self-pay | Admitting: Family Medicine

## 2021-06-29 ENCOUNTER — Telehealth: Payer: Self-pay

## 2021-06-29 NOTE — Telephone Encounter (Signed)
Received a call from [place] at [place], they state there is a new rule that with the certain medications being prescribed by [facility] they need to know how they are following up with the patient. Requesting a returned phone call.

## 2021-06-29 NOTE — Telephone Encounter (Signed)
A user error has taken place: charting done on wrong patient and has been corrected.

## 2021-07-03 ENCOUNTER — Ambulatory Visit (INDEPENDENT_AMBULATORY_CARE_PROVIDER_SITE_OTHER): Payer: Medicare Other | Admitting: Family Medicine

## 2021-07-03 ENCOUNTER — Other Ambulatory Visit: Payer: Self-pay

## 2021-07-03 ENCOUNTER — Encounter: Payer: Self-pay | Admitting: Family Medicine

## 2021-07-03 VITALS — BP 116/73 | HR 63 | Temp 98.3°F | Ht 62.0 in | Wt 140.0 lb

## 2021-07-03 DIAGNOSIS — G3281 Cerebellar ataxia in diseases classified elsewhere: Secondary | ICD-10-CM

## 2021-07-03 DIAGNOSIS — F330 Major depressive disorder, recurrent, mild: Secondary | ICD-10-CM

## 2021-07-03 DIAGNOSIS — F419 Anxiety disorder, unspecified: Secondary | ICD-10-CM

## 2021-07-03 DIAGNOSIS — S069X9S Unspecified intracranial injury with loss of consciousness of unspecified duration, sequela: Secondary | ICD-10-CM

## 2021-07-03 DIAGNOSIS — Z5181 Encounter for therapeutic drug level monitoring: Secondary | ICD-10-CM | POA: Diagnosis not present

## 2021-07-03 DIAGNOSIS — I10 Essential (primary) hypertension: Secondary | ICD-10-CM

## 2021-07-03 DIAGNOSIS — F331 Major depressive disorder, recurrent, moderate: Secondary | ICD-10-CM | POA: Insufficient documentation

## 2021-07-03 DIAGNOSIS — K219 Gastro-esophageal reflux disease without esophagitis: Secondary | ICD-10-CM | POA: Diagnosis not present

## 2021-07-03 DIAGNOSIS — Z23 Encounter for immunization: Secondary | ICD-10-CM

## 2021-07-03 DIAGNOSIS — R9082 White matter disease, unspecified: Secondary | ICD-10-CM

## 2021-07-03 DIAGNOSIS — I69311 Memory deficit following cerebral infarction: Secondary | ICD-10-CM | POA: Diagnosis not present

## 2021-07-03 DIAGNOSIS — E782 Mixed hyperlipidemia: Secondary | ICD-10-CM | POA: Diagnosis not present

## 2021-07-03 DIAGNOSIS — N3281 Overactive bladder: Secondary | ICD-10-CM | POA: Diagnosis not present

## 2021-07-03 DIAGNOSIS — Z79899 Other long term (current) drug therapy: Secondary | ICD-10-CM

## 2021-07-03 DIAGNOSIS — N1831 Chronic kidney disease, stage 3a: Secondary | ICD-10-CM | POA: Diagnosis not present

## 2021-07-03 DIAGNOSIS — E034 Atrophy of thyroid (acquired): Secondary | ICD-10-CM

## 2021-07-03 DIAGNOSIS — H9190 Unspecified hearing loss, unspecified ear: Secondary | ICD-10-CM | POA: Insufficient documentation

## 2021-07-03 HISTORY — DX: Unspecified hearing loss, unspecified ear: H91.90

## 2021-07-03 MED ORDER — BUPROPION HCL ER (SR) 200 MG PO TB12: 200.0000 mg | ORAL_TABLET | Freq: Two times a day (BID) | ORAL | 1 refills | Status: DC

## 2021-07-03 MED ORDER — AMLODIPINE BESYLATE 10 MG PO TABS: 10.0000 mg | ORAL_TABLET | Freq: Every day | ORAL | 1 refills | Status: DC

## 2021-07-03 MED ORDER — MIRABEGRON ER 50 MG PO TB24: 50.0000 mg | ORAL_TABLET | Freq: Every day | ORAL | 3 refills | Status: DC

## 2021-07-03 MED ORDER — FAMOTIDINE 20 MG PO TABS: 20.0000 mg | ORAL_TABLET | Freq: Two times a day (BID) | ORAL | 1 refills | Status: DC

## 2021-07-03 MED ORDER — ESOMEPRAZOLE MAGNESIUM 40 MG PO CPDR: DELAYED_RELEASE_CAPSULE | ORAL | 1 refills | Status: DC

## 2021-07-03 MED ORDER — FLUOXETINE HCL 40 MG PO CAPS: 40.0000 mg | ORAL_CAPSULE | Freq: Every day | ORAL | 1 refills | Status: DC

## 2021-07-03 MED ORDER — LEVOTHYROXINE SODIUM 25 MCG PO TABS: 25.0000 ug | ORAL_TABLET | Freq: Every day | ORAL | 3 refills | Status: DC

## 2021-07-03 NOTE — Progress Notes (Signed)
Patient ID: Abigail Michael, female  DOB: [birth year], 70 y.o.   MRN: [id number] Patient Care Team    Relationship Specialty Notifications Start End  Ma Hillock, DO PCP - General Family Medicine  01/16/18   Sharlene Dory, MD Referring Physician Neurology  08/28/18   Tommi Rumps  Dentistry  07/17/19   [name]  Orthodontics  07/17/19   Feliberto Harts R (Inactive)  Oral Surgery  07/17/19   Delanna Ahmadi, MD Referring Physician Psychiatry  07/17/19    Comment: Neurology    Chief Complaint  Patient presents with   Hypertension    [place]; pt is not fasting    Subjective: Abigail Michael is a 70 y.o.  female present for follow up cmc Depression with anxiety Patient reports she is recently having more depression on Wellbutrin and prozac.  She had a therapist that she enjoyed seeing, however that therapist has since left that practice.  She is still in a group of online people within her age bracket they get together and go out to eat, go bowling etc. she is getting her own apartment in the next couple weeks, it is close to her daughter and she is looking forward to this.  She feels she started drinking too much again to cope with her loneliness.  She does have a cruise coming up with other women her age.  She states she just gets "angry "at having to deal with her traumatic brain injury and memory issues.  Patient denies active plan for SI. Prior note: Patient presents today to discuss her depression anxiety.  She reports she would like a referral to a therapist or psychologist to discuss her worsening anger.  She is prescribed Prozac 40 mg daily and has been doing well on this medication for a few years.  She states over the last few weeks she has noted she feels more lonely.  She is struggling working through the her feelings of "abandonment "and relocation/move to the [place] area.  She reports even though she is the one that had left [place] she still feels hurt and abandoned by the  circumstances.  She has noted she has become more angry and irritable.  Her daughter told her she threw a tantrum the other day and she agrees she let her emotions get out of hand.  She reports she has been drinking more alcohol, which is not like her.  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  She is having more difficulty with attention and focus and this makes her upset.  When her emotions start to overwhelm her she tries to get out of the house and take a walk or work in the yard.  She has also tried to reach out and join social groups and most recently went bowling.  The past 6 months she has been in a relationship with a man that she has known for years and had been her friend prior.  She has enjoyed this relationship and had reported happiness with the relationship.  Today she endorses feeling guilty  because she knows this relationship is not what she needs and is not working because of the long distance between them.  She has had a few therapy sessions with Dr. [name] in the past.  She really liked Dr. [name] but did not feel a connection with her.  She would like a referral to a different therapist today. Prior note:  Has been on prozac for a little over a year after CVA. She reports she feels her depression and anxiety are worsening. She has had many life changes. Moved from [place] last year to be close to family. Currently living with her daughter and enjoying being close to her family. Having trouble adjusting after retiring, moving and TBI last year. She denies SI or HI. Tried in the past: celexa and effexor.   Essential hypertension/HLD/morbid obesity/h/o TIA: Pt reports compliance with amlodipine 10 mg QD.Blood pressures ranges at home are within normal range. Patient denies chest pain, shortness of breath, dizziness or lower extremity edema.  Pt takes a  daily baby ASA. Pt is  prescribed statin. H/O TIA.  Diet:  Low sodium Exercise: Patient reports she has become more active getting out walking more routinely and feeling better. RF: HTN, HLD, Obesity, h/o CVA, FHX MI   GERD/PPI:  Patient reports compliance with Nexium and Pepcid.    Vit d and b12 levels utd..   Hypothyroidism:  Patient reports compliance with 12.5 mcg levothyroxine daily.   Is up-to-date  Overactive bladder/MS-white matter disease/TIA/TBI/ataxia: Patient reports compliance with her Myrbetriq and it is working well for her.  Pt reports compliance with aricpet 10 mg qd.   Prior note: Patient was established with neurology.  However today she states she would like to be referred to a new neurologist if possible.  She asked for refills on her Aricept and dalfampridine.  Explained to patient I be happy to refill her Aricept however dalfampridine is a medication that is prescribed by neurology.   Depression screen [facility] 2/9 07/03/2021 12/02/2020 08/06/2020 07/01/2020 03/05/2020  Decreased Interest 2 0 0 0 0  Down, Depressed, Hopeless 2 0 0 0 0  PHQ - 2 Score 4 0 0 0 0  Altered sleeping 0 0 - 0 0  Tired, decreased energy 0 0 - 0 0  Change in appetite 0 0 - 0 0  Feeling bad or failure about yourself  2 0 - 0 0  Trouble concentrating 0 0 - 0 1  Moving slowly or fidgety/restless 0 0 - 0 0  Suicidal thoughts 1 0 - 0 0  PHQ-9 Score 7 0 - 0 1  Difficult doing work/chores - Not difficult at all - - Not difficult at all  Some recent data might be hidden   GAD 7 : Generalized Anxiety Score 07/03/2021 03/05/2020 01/23/2020 05/07/2019  Nervous, Anxious, on Edge 2 0 3 0  Control/stop worrying 2 0 0 0  Worry too much - different things 2 0 1 0  Trouble relaxing 0 0 3 0  Restless 0 0 0 0  Easily annoyed or irritable 2 0 3 0  Afraid - awful might happen 2 0 0 0  Total GAD 7 Score 10 0 10 0  Anxiety Difficulty - Not difficult at all Not difficult at all -       Fall Risk  08/06/2020 07/17/2019 05/07/2019 08/28/2018 01/16/2018  Falls in the past  year? 0 0 0 0 Yes  Comment - - - - Jan 2016  Number falls in past yr: 0 0 - 0 1  Injury with Fall? 0 0 - 0 Yes  Risk for fall due to : - - - Impaired balance/gait;Mental status change History of fall(s)  Follow up Falls prevention discussed Falls prevention discussed Falls evaluation completed Falls evaluation completed;Falls prevention discussed Follow up appointment     Immunization History  Administered Date(s) Administered   Fluad  Quad(high Dose 65+) 09/16/2020, 07/03/2021   Influenza, High Dose Seasonal PF 08/28/2018, 06/13/2019   PFIZER(Purple Top)SARS-COV-2 Vaccination 11/09/2019, 12/03/2019, 09/16/2020   Pneumococcal Conjugate-13 12/02/2020   Pneumococcal Polysaccharide-23 08/28/2018   Zoster Recombinat (Shingrix) 05/10/2019, 09/10/2019    No results found.  Past Medical History:  Diagnosis Date   Acid reflux    Brain injury ([place]) 2015   Due to a fall    Bulging of cervical intervertebral disc without myelopathy    prior records   Chicken pox    Constipation    Depression    Frequent headaches    GERD (gastroesophageal reflux disease)    H/O TB skin testing    Positive   Hyperlipidemia    Hypertension    MS (multiple sclerosis) ([place]) 12/15/2016   possible dx. many MRI w/ positive white matter abnormality. MRI reported stable since 2015. mild progressive  Cognitive loss   Obstructive sleep apnea    on prior neuro notes 10/2016; no results.    Osteoporosis    had been on reclast   Post concussion syndrome 10/2013   ongoing issues with concentration, memory loss, lability; EEG completed and normal.    TBI (traumatic brain injury) ([place]) 2014   fall at [facility], hit head off of floor. No bleed, "concussion". Seen neurology since for focus and word finding.    TIA (transient ischemic attack) 11/30/2016   MRI normal. No lateralization. Speech affected and weakness during a hypotensive episdoe.    Urinary incontinence    Allergies  Allergen Reactions   Bee Venom  Anaphylaxis   Amoxicillin Dermatitis    Yeast infection   Past Surgical History:  Procedure Laterality Date   BACK SURGERY  02/2015   herniated disc L4/5/6   BREAST BIOPSY  1988   biopsy   [place]ONOSCOPY     [place] Around 2015   ESOPHAGO[place]STRODUODENOSCOPY  2016   [place]   LAPAROSCOPIC HYSTERECTOMY     partial    SHOULDER ARTHROSCOPY W/ LABRAL REPAIR Left 02/13/2016   left shoulder   Study: cognitive eval  06/23/2016   mild cog-linguistic impairment, difficulty with verbal fluency   Study: EEG   04/12/2015   Studies: EEG completed and normal.    Study: MRI  04/28/2016   Impression: numerous non-enhancing flair hyperintense lesions w/in the brain .Suspicious for demyelinating disease such as lyme or MS. Her Lyme test were negative.    Family History  Problem Relation Age of Onset   Lung cancer Mother    Alcohol abuse Father    Throat cancer Father    Lung cancer Father    Depression Father    Heart attack Father    Breast cancer Sister    Alcohol abuse Brother    Diabetes Brother    Mental illness Sister    Stroke Daughter    Endometriosis Daughter    Heart disease Son    Breast cancer Maternal Aunt    Diabetes Maternal Grandmother    GER disease Daughter    Esophageal cancer Paternal Grandfather    Colon cancer Neg Hx    Rectal cancer Neg Hx    Stomach cancer Neg Hx    Social History   Socioeconomic History   Marital status: Single    Spouse name: Not on file   Number of children: 4   Years of education: two years of college   Highest education level: Not on file  Occupational History   Occupation: retired  Tobacco Use   Smoking  status: Former    Packs/day: 1.00    Years: 19.00    Pack years: 19.00    Types: Cigarettes    Quit date: 2012    Years since quitting: 10.7   Smokeless tobacco: Never   Tobacco comments:    quit 6 years ago  Vaping Use   Vaping Use: Never used  Substance and Sexual Activity   Alcohol use: Yes     Alcohol/week: 1.0 standard drink    Types: 1 Glasses of wine per week    Comment: twice a month   Drug use: Never   Sexual activity: Not Currently    Partners: Male  Other Topics Concern   Not on file  Social History Narrative   Single. Lived in [place] area, moved to [place] to be close to her daughters 2018. Lives with her daughter.   Some college, worked as Programmer, systems. Retired.    Social drinker. Former smoker.   Exercises routinely.    Wears dentures.    Drinks caffeine, takes a daily vitamin.   Smoke alarm in the home. Wears her seatbelt.    Feels safe in her relationships.   3-4 cups per day.   Four children (2 biological, 2 she raised as her own).   Social Determinants of Health   Financial Resource Strain: Low Risk    Difficulty of Paying Living Expenses: Not hard at all  Food Insecurity: No Food Insecurity   Worried About Charity fundraiser in the Last Year: Never true   [place] in the Last Year: Never true  Transportation Needs: No Transportation Needs   Lack of Transportation (Medical): No   Lack of Transportation (Non-Medical): No  Physical Activity: Sufficiently Active   Days of Exercise per Week: 4 days   Minutes of Exercise per Session: 40 min  Stress: No Stress Concern Present   Feeling of Stress : Not at all  Social Connections: Moderately Isolated   Frequency of Communication with Friends and Family: More than three times a week   Frequency of Social Gatherings with Friends and Family: More than three times a week   Attends Religious Services: Never   Marine scientist or Organizations: Yes   Attends Music therapist: More than 4 times per year   Marital Status: Divorced  Human resources officer Violence: Not At Risk   Fear of Current or Ex-Partner: No   Emotionally Abused: No   Physically Abused: No   Sexually Abused: No   Allergies as of 07/03/2021       Reactions   Bee Venom Anaphylaxis   Amoxicillin Dermatitis   Yeast infection         Medication List        Accurate as of July 03, 2021  4:35 PM. If you have any questions, ask your nurse or doctor.          amLODipine 10 MG tablet Commonly known as: NORVASC Take 1 tablet (10 mg total) by mouth daily.   aspirin EC 81 MG tablet Take 81 mg by mouth daily.   atorvastatin 40 MG tablet Commonly known as: LIPITOR Take 1 tablet (40 mg total) by mouth daily.   buPROPion 200 MG 12 hr tablet Commonly known as: Wellbutrin SR Take 1 tablet (200 mg total) by mouth 2 (two) times daily. What changed:  medication strength how much to take Changed by: Howard Pouch, DO   donepezil 10 MG tablet Commonly known as: ARICEPT Take  1 tablet (10 mg total) by mouth daily.   EPINEPHrine 0.3 mg/0.3 mL Soaj injection Commonly known as: EPI-PEN Inject 0.3 mLs (0.3 mg total) into the skin as needed.   esomeprazole 40 MG capsule Commonly known as: NEXIUM TAKE ONE CAPSULE BY MOUTH ONE TIME DAILY AT NOON   famotidine 20 MG tablet Commonly known as: PEPCID Take 1 tablet (20 mg total) by mouth 2 (two) times daily.   FLUoxetine 40 MG capsule Commonly known as: PROZAC Take 1 capsule (40 mg total) by mouth daily.   levothyroxine 25 MCG tablet Commonly known as: SYNTHROID Take 1 tablet (25 mcg total) by mouth daily before breakfast. On an empty stomach   mirabegron ER 50 MG Tb24 tablet Commonly known as: Myrbetriq Take 1 tablet (50 mg total) by mouth daily. What changed: how much to take Changed by: Howard Pouch, DO   OVER THE COUNTER MEDICATION Neuro-Alpha 2 caps QD   TURMERIC PO Take by mouth.        All past medical history, surgical history, allergies, family history, immunizations andmedications were updated in the EMR today and reviewed under the history and medication portions of their EMR.     No results found for this or any previous visit (from the past 2160 hour(s)).  Patient was never admitted.   ROS: 14 pt review of systems performed  and negative (unless mentioned in an HPI)  Objective: BP 116/73   Pulse 63   Temp 98.3 F (36.8 C) (Oral)   Ht '5\' 2"'  (1.575 m)   Wt 140 lb (63.5 kg)   LMP 10/18/1990   SpO2 99%   BMI 25.61 kg/m  Gen: Afebrile. No acute distress.  Nontoxic, very pleasant, Caucasian female. HENT: AT. [place].  Cough.  No hoarseness. Eyes:Pupils Equal Round Reactive to light, Extraocular movements intact,  Conjunctiva without redness, discharge or icterus. CV: RRR no murmur, no edema, +2/4 P posterior tibialis pulses Chest: CTAB, no wheeze or crackles Skin: No rashes, purpura or petechiae.  Neuro:  Normal gait. PERLA. EOMi. Alert. Oriented x 3 Psych: Normal affect, dress and demeanor. Normal speech. Normal thought content and judgment..    Assessment/plan: Abigail Michael is a 70 y.o. female present for [facility] Depression with anxiety Could use more coverage Continue fluoxetine 40 mg daily Increase Wellbutrin 150 mg twice daily to 200 mg twice a day - Referral to Myra Gianotti for therapy was placed > new referral placed today, this particular provider has since moved on and patient needs to establish as soon as possible with in-house provider. - Tried in the past: celexa and effexor.  - Follow-up in 5.5 months.   Essential hypertension/morbid obesity/HLD Stable.  Continue  amlodipine 10 mg daily to [facility]. Continue  atorvastatin 40 mg daily, refills called into [facility]. Continue  statin and ASA - cbc, cmp, ldl and tsh UTD- due next visit.  F/u 5.5 mos   Traumatic brain injury with loss of consciousness, sequela (HCC)/White matter disease/TIA/overactive bladder/ataxia Stable.  - continue   Aricept 10 mg qd -continue  Myrbetriq 50 mg daily.  Completed work form for her to be placed in 1st floor apartment.  She worked with physical therapy and did really well with improving her gait. Patient is established with neurology.  Encounter for monitoring long-term proton pump inhibitor  therapy/GERD Stable.  Continue omeprazole. Continue  Pepcid.  Need for influenza vaccination - Flu Vaccine QUAD High Dose(Fluad)   Hypothyroidism due to acquired atrophy of thyroid - TSH Stage 3a chronic  kidney disease ([place]) - Comp Met (CMET) Hypercalcemia Chronic.hydrate - PTH, Intact and Calcium Hearing loss, unspecified hearing loss type, unspecified laterality - Ambulatory referral to Audiology   Return in about 5 months (around 12/17/2021) for [place] (30 min).   Orders Placed This Encounter  Procedures   Flu Vaccine QUAD High Dose(Fluad)   Comp Met (CMET)   TSH   PTH, Intact and Calcium   Ambulatory referral to Psychology   Ambulatory referral to Audiology    Meds ordered this encounter  Medications   esomeprazole (NEXIUM) 40 MG capsule    Sig: TAKE ONE CAPSULE BY MOUTH ONE TIME DAILY AT NOON    Dispense:  90 capsule    Refill:  1   mirabegron ER (MYRBETRIQ) 50 MG TB24 tablet    Sig: Take 1 tablet (50 mg total) by mouth daily.    Dispense:  90 tablet    Refill:  3   amLODipine (NORVASC) 10 MG tablet    Sig: Take 1 tablet (10 mg total) by mouth daily.    Dispense:  90 tablet    Refill:  1   buPROPion (WELLBUTRIN SR) 200 MG 12 hr tablet    Sig: Take 1 tablet (200 mg total) by mouth 2 (two) times daily.    Dispense:  90 tablet    Refill:  1   famotidine (PEPCID) 20 MG tablet    Sig: Take 1 tablet (20 mg total) by mouth 2 (two) times daily.    Dispense:  180 tablet    Refill:  1   FLUoxetine (PROZAC) 40 MG capsule    Sig: Take 1 capsule (40 mg total) by mouth daily.    Dispense:  90 capsule    Refill:  1   levothyroxine (SYNTHROID) 25 MCG tablet    Sig: Take 1 tablet (25 mcg total) by mouth daily before breakfast. On an empty stomach    Dispense:  90 tablet    Refill:  3    Referral Orders         Ambulatory referral to Psychology         Ambulatory referral to Audiology       Note is dictated utilizing voice recognition software. Although note has  been proof read prior to signing, occasional typographical errors still can be missed. If any questions arise, please do not hesitate to call for verification.  Electronically signed by: Howard Pouch, DO [place]

## 2021-07-03 NOTE — Patient Instructions (Addendum)
  Great to see you today.  I have refilled the medication(s) we provide.   If labs were collected, we will inform you of lab results once received either by echart message or telephone call.   - echart message- for normal results that have been seen by the patient already.   - telephone call: abnormal results or if patient has not viewed results in their echart.   Referral for hearing test ordered today.

## 2021-07-08 LAB — ADD ON CMP
AG Ratio: 1.7 (calc) (ref 1.0–2.5)
ALT: 16 U/L (ref 6–29)
AST: 16 U/L (ref 10–35)
Albumin: 4.6 g/dL (ref 3.6–5.1)
Alkaline phosphatase (APISO): 84 U/L (ref 37–153)
BUN: 16 mg/dL (ref 7–25)
CO2: 19 mmol/L — ABNORMAL LOW (ref 20–32)
Calcium: 10.7 mg/dL — ABNORMAL HIGH (ref 8.6–10.4)
Chloride: 101 mmol/L (ref 98–110)
Creat: 0.99 mg/dL (ref 0.60–1.00)
Globulin: 2.7 g/dL (calc) (ref 1.9–3.7)
Glucose, Bld: 120 mg/dL — ABNORMAL HIGH (ref 65–99)
Potassium: 3.9 mmol/L (ref 3.5–5.3)
Sodium: 140 mmol/L (ref 135–146)
Total Bilirubin: 0.4 mg/dL (ref 0.2–1.2)
Total Protein: 7.3 g/dL (ref 6.1–8.1)
eGFR: 61 mL/min/{1.73_m2} (ref >=60)

## 2021-07-08 LAB — TEST AUTHORIZATION

## 2021-07-08 LAB — PTH, INTACT AND CALCIUM
Calcium: 11 mg/dL — ABNORMAL HIGH (ref 8.6–10.4)
PTH: 22 pg/mL (ref 16–77)

## 2021-07-08 LAB — TSH: TSH: 2.37 mIU/L (ref 0.40–4.50)

## 2021-07-21 DIAGNOSIS — S060X9A Concussion with loss of consciousness of unspecified duration, initial encounter: Secondary | ICD-10-CM | POA: Diagnosis not present

## 2021-07-21 DIAGNOSIS — H903 Sensorineural hearing loss, bilateral: Secondary | ICD-10-CM | POA: Diagnosis not present

## 2021-07-21 DIAGNOSIS — E039 Hypothyroidism, unspecified: Secondary | ICD-10-CM | POA: Diagnosis not present

## 2021-07-28 DIAGNOSIS — S060X9A Concussion with loss of consciousness of unspecified duration, initial encounter: Secondary | ICD-10-CM | POA: Diagnosis not present

## 2021-07-28 DIAGNOSIS — H903 Sensorineural hearing loss, bilateral: Secondary | ICD-10-CM | POA: Diagnosis not present

## 2021-07-28 DIAGNOSIS — E039 Hypothyroidism, unspecified: Secondary | ICD-10-CM | POA: Diagnosis not present

## 2021-07-30 ENCOUNTER — Ambulatory Visit (INDEPENDENT_AMBULATORY_CARE_PROVIDER_SITE_OTHER): Payer: Medicare Other | Admitting: Family Medicine

## 2021-07-30 ENCOUNTER — Encounter: Payer: Self-pay | Admitting: Family Medicine

## 2021-07-30 ENCOUNTER — Other Ambulatory Visit: Payer: Self-pay

## 2021-07-30 VITALS — BP 119/75 | HR 59 | Temp 98.1°F | Ht 62.0 in | Wt 139.0 lb

## 2021-07-30 DIAGNOSIS — R110 Nausea: Secondary | ICD-10-CM

## 2021-07-30 DIAGNOSIS — T753XXA Motion sickness, initial encounter: Secondary | ICD-10-CM | POA: Diagnosis not present

## 2021-07-30 MED ORDER — BUPROPION HCL ER (SR) 200 MG PO TB12: 200.0000 mg | ORAL_TABLET | Freq: Two times a day (BID) | ORAL | 1 refills | Status: DC

## 2021-07-30 MED ORDER — ONDANSETRON HCL 4 MG PO TABS: 4.0000 mg | ORAL_TABLET | Freq: Three times a day (TID) | ORAL | 0 refills | Status: DC | PRN

## 2021-07-30 NOTE — Patient Instructions (Signed)
   I have called in zofran to help with nausea.   Motion sickness can strike quickly and make you break out in a cold sweat and feel like you need to throw up.  Other common symptoms include: Dizziness Increase in saliva production Loss of appetite Pale skin In addition, some people get headaches, feel very tired, or have shallow breathing.  How is motion sickness prevented? Most people who are susceptible to motion sickness are aware of the fact. If you're prone to motion sickness, the following preventive measures may help.  Plan ahead when booking a trip. If traveling by air, ask for a window or wing seat. On trains, boats, or buses sit toward the front and try to avoid facing backward. On a ship, ask for a cabin at water level and close to the front or the middle of the vessel. Open a vent for a source of fresh air if possible, and avoid reading.  Sitting at the front of a car or bus, or doing the driving yourself, often helps. Many people who experience motion sickness in a vehicle find that they don't have the symptoms when they're driving.  It's important to get plenty of rest the night before traveling and avoid drinking alcohol. Dehydration, headache, and anxiety all lead to poorer outcomes if you're prone to motion sickness.  Eat well so that your stomach is settled. Stay away from greasy or acidic foods before and during your travels.  Have a home remedy on hand or try alternative therapies. Many experts say peppermint can help, as well as ginger and black horehound. Although their effectiveness hasn't been proven by science, these options are available.  There are also travel wrist bands that can be worn. Sea-Band's are acupressure wrist bands that are clinically proven to relieve nausea, motion sickness and morning sickness in addition to helping with post-operative and chemotherapy-induced nausea. They are completely drug free and non invasive.  Several medications exist for  the treatment of motion sickness. Most only prevent the onset of symptoms. Any medication can come with risk. Many induce sleepiness, so operating machinery or a vehicle isn't permitted while taking these types of medications.  Frequently prescribed motion sickness medications include scopolamine patches- side affects include dizziness, drowsiness, dry mouth or eyes, blurred vision and/or retention of urine (unable to empty bladder). Over-the-counter motion sickness medication is Dramamine or Gravol.

## 2021-07-30 NOTE — Progress Notes (Signed)
This visit occurred during the SARS-CoV-2 public health emergency.  Safety protocols were in place, including screening questions prior to the visit, additional usage of staff PPE, and extensive cleaning of exam room while observing appropriate contact time as indicated for disinfecting solutions.    Abigail Michael , 10-12-1951, 70 y.o., female MRN: [id number] Patient Care Team    Relationship Specialty Notifications Start End  Ma Hillock, DO PCP - General Family Medicine  01/16/18   Sharlene Dory, MD Referring Physician Neurology  08/28/18   Tommi Rumps  Dentistry  07/17/19   [name]  Orthodontics  07/17/19   Feliberto Harts R (Inactive)  Oral Surgery  07/17/19   Delanna Ahmadi, MD Referring Physician Psychiatry  07/17/19    Comment: Neurology    Chief Complaint  Patient presents with   Nausea     Subjective: Pt presents for an OV with complaints of motion sickness history. She is getting ready to take her first cruise to [place]. She has been sea sick in the past on smaller vessels and is concerned she may become ill on her trip. She desires to discuss options to help her avoid sea sickness today.  Depression screen [facility] 2/9 07/30/2021 07/03/2021 12/02/2020 08/06/2020 07/01/2020  Decreased Interest 0 2 0 0 0  Down, Depressed, Hopeless 0 2 0 0 0  PHQ - 2 Score 0 4 0 0 0  Altered sleeping - 0 0 - 0  Tired, decreased energy - 0 0 - 0  Change in appetite - 0 0 - 0  Feeling bad or failure about yourself  - 2 0 - 0  Trouble concentrating - 0 0 - 0  Moving slowly or fidgety/restless - 0 0 - 0  Suicidal thoughts - 1 0 - 0  PHQ-9 Score - 7 0 - 0  Difficult doing work/chores - - Not difficult at all - -  Some recent data might be hidden    Allergies  Allergen Reactions   Bee Venom Anaphylaxis   Amoxicillin Dermatitis    Yeast infection   Social History   Social History Narrative   Single. Lived in [place] area, moved to [place] to be close to her daughters 2018. Lives  with her daughter.   Some college, worked as Programmer, systems. Retired.    Social drinker. Former smoker.   Exercises routinely.    Wears dentures.    Drinks caffeine, takes a daily vitamin.   Smoke alarm in the home. Wears her seatbelt.    Feels safe in her relationships.   3-4 cups per day.   Four children (2 biological, 2 she raised as her own).   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 many MRI w/ positive white matter abnormality. MRI reported stable since 2015. mild progressive  Cognitive loss   Obstructive sleep apnea    on prior neuro notes 10/2016; no results.    Osteoporosis    had been on reclast   Post concussion syndrome 10/2013  ongoing issues with concentration, memory loss, lability; EEG completed and normal.    TBI (traumatic brain injury) 2014   fall at [facility], hit head off of floor. No bleed, "concussion". Seen neurology since for focus and word finding.    TIA (transient ischemic attack) 11/30/2016   MRI normal. No lateralization. Speech affected and weakness during a hypotensive episdoe.    Urinary incontinence    Past Surgical History:  Procedure Laterality Date   BACK SURGERY  02/2015   herniated disc L4/5/6   BREAST BIOPSY  1988   biopsy   COLONOSCOPY     [place] Around 2015   ESOPHAGOGASTRODUODENOSCOPY  2016   [place]   LAPAROSCOPIC HYSTERECTOMY     partial    SHOULDER ARTHROSCOPY W/ LABRAL REPAIR Left 02/13/2016   left shoulder   Study: cognitive eval  06/23/2016   mild cog-linguistic impairment, difficulty with verbal fluency   Study: EEG   04/12/2015   Studies: EEG  completed and normal.    Study: MRI  04/28/2016   Impression: numerous non-enhancing flair hyperintense lesions w/in the brain .Suspicious for demyelinating disease such as lyme or MS. Her Lyme test were negative.    Family History  Problem Relation Age of Onset   Lung cancer Mother    Alcohol abuse Father    Throat cancer Father    Lung cancer Father    Depression Father    Heart attack Father    Breast cancer Sister    Alcohol abuse Brother    Diabetes Brother    Mental illness Sister    Stroke Daughter    Endometriosis Daughter    Heart disease Son    Breast cancer Maternal Aunt    Diabetes Maternal Grandmother    GER disease Daughter    Esophageal cancer Paternal Grandfather    Colon cancer Neg Hx    Rectal cancer Neg Hx    Stomach cancer Neg Hx    Allergies as of 07/30/2021       Reactions   Bee Venom Anaphylaxis   Amoxicillin Dermatitis   Yeast infection        Medication List        Accurate as of July 30, 2021  9:33 AM. If you have any questions, ask your nurse or doctor.          amLODipine 10 MG tablet Commonly known as: NORVASC Take 1 tablet (10 mg total) by mouth daily.   aspirin EC 81 MG tablet Take 81 mg by mouth daily.   atorvastatin 40 MG tablet Commonly known as: LIPITOR Take 1 tablet (40 mg total) by mouth daily.   buPROPion 200 MG 12 hr tablet Commonly known as: Wellbutrin SR Take 1 tablet (200 mg total) by mouth 2 (two) times daily.   donepezil 10 MG tablet Commonly known as: ARICEPT Take 1 tablet (10 mg total) by mouth daily.   EPINEPHrine 0.3 mg/0.3 mL Soaj injection Commonly known as: EPI-PEN Inject 0.3 mLs (0.3 mg total) into the skin as needed.   esomeprazole 40 MG capsule Commonly known as: NEXIUM TAKE ONE CAPSULE BY MOUTH ONE TIME DAILY AT NOON   famotidine 20 MG tablet Commonly known as: PEPCID Take 1 tablet (20 mg total) by mouth 2 (two) times daily.   FLUoxetine 40 MG capsule Commonly known as:  PROZAC Take 1 capsule (40 mg total) by mouth daily.   levothyroxine 25 MCG tablet Commonly known as: SYNTHROID Take 1 tablet (25 mcg total) by mouth daily  before breakfast. On an empty stomach   mirabegron ER 50 MG Tb24 tablet Commonly known as: Myrbetriq Take 1 tablet (50 mg total) by mouth daily.   ondansetron 4 MG tablet Commonly known as: ZOFRAN Take 1 tablet (4 mg total) by mouth every 8 (eight) hours as needed for nausea or vomiting. Started by: Howard Pouch, DO   OVER THE COUNTER MEDICATION Neuro-Alpha 2 caps QD   TURMERIC PO Take by mouth.        All past medical history, surgical history, allergies, family history, immunizations andmedications were updated in the EMR today and reviewed under the history and medication portions of their EMR.     ROS: Negative, with the exception of above mentioned in HPI   Objective:  BP 119/75   Pulse (!) 59   Temp 98.1 F (36.7 C) (Oral)   Ht 5\' 2"  (1.575 m)   Wt 139 lb (63 kg)   LMP 10/18/1990   SpO2 99%   BMI 25.42 kg/m  Body mass index is 25.42 kg/m. Gen: Afebrile. No acute distress. Nontoxic in appearance, well developed, well nourished.  HENT: AT. [place].  Eyes:Pupils Equal Round Reactive to light, Extraocular movements intact,  Conjunctiva without redness, discharge or icterus. CV: RRR  Chest: CTAB, no wheeze or crackles. Good air movement, normal resp effort.  Neuro: Normal gait. PERLA. EOMi. Alert. Oriented x3  Psych: Normal affect, dress and demeanor. Normal speech. Normal thought content and judgment.  No results found. No results found. No results found for this or any previous visit (from the past 24 hour(s)).  Assessment/Plan: Abigail Michael is a 70 y.o. female present for OV for  Motion sickness, initial encounter/Nausea We discussed medications, sea-bands, oral lozenges and behavioral changes that can help prevent motion sickness.  Do not recommend scopolamine for her with her age, [facility] and medications.   AVS education printed on motion sickness prevention techniques.  Zofran prescribed for nausea.   Reviewed expectations re: course of current medical issues. Discussed self-management of symptoms. Outlined signs and symptoms indicating need for more acute intervention. Patient verbalized understanding and all questions were answered. Patient received an After-Visit Summary.    No orders of the defined types were placed in this encounter.  Meds ordered this encounter  Medications   ondansetron (ZOFRAN) 4 MG tablet    Sig: Take 1 tablet (4 mg total) by mouth every 8 (eight) hours as needed for nausea or vomiting.    Dispense:  20 tablet    Refill:  0   buPROPion (WELLBUTRIN SR) 200 MG 12 hr tablet    Sig: Take 1 tablet (200 mg total) by mouth 2 (two) times daily.    Dispense:  180 tablet    Refill:  1   Referral Orders  No referral(s) requested today     Note is dictated utilizing voice recognition software. Although note has been proof read prior to signing, occasional typographical errors still can be missed. If any questions arise, please do not hesitate to call for verification.   electronically signed by:  Howard Pouch, DO  [place]

## 2021-08-12 ENCOUNTER — Ambulatory Visit (INDEPENDENT_AMBULATORY_CARE_PROVIDER_SITE_OTHER): Payer: Medicare Other | Admitting: *Deleted

## 2021-08-12 DIAGNOSIS — Z1231 Encounter for screening mammogram for malignant neoplasm of breast: Secondary | ICD-10-CM

## 2021-08-12 DIAGNOSIS — Z0000 Encounter for general adult medical examination without abnormal findings: Secondary | ICD-10-CM | POA: Diagnosis not present

## 2021-08-12 DIAGNOSIS — Z780 Asymptomatic menopausal state: Secondary | ICD-10-CM

## 2021-08-12 NOTE — Patient Instructions (Signed)
Abigail Michael , Thank you for taking time to come for your Medicare Wellness Visit. I appreciate your ongoing commitment to your health goals. Please review the following plan we discussed and let me know if I can assist you in the future.   Screening recommendations/referrals: Colonoscopy: up to date Mammogram: Education provided Bone Density: Education provided Recommended yearly ophthalmology/optometry visit for glaucoma screening and checkup Recommended yearly dental visit for hygiene and checkup  Vaccinations: Influenza vaccine: up to date Pneumococcal vaccine: up to date Tdap vaccine: Eduction provided Shingles vaccine: up to date    Advanced directives: Education provided  Conditions/risks identified:   Next appointment: 12-16-2020 @ 10:30  Dr. Raoul Pitch   Preventive Care 72 Years and Older, Female Preventive care refers to lifestyle choices and visits with your health care provider that can promote health and wellness. What does preventive care include? A yearly physical exam. This is also called an annual well check. Dental exams once or twice a year. Routine eye exams. Ask your health care provider how often you should have your eyes checked. Personal lifestyle choices, including: Daily care of your teeth and gums. Regular physical activity. Eating a healthy diet. Avoiding tobacco and drug use. Limiting alcohol use. Practicing safe sex. Taking low-dose aspirin every day. Taking vitamin and mineral supplements as recommended by your health care provider. What happens during an annual well check? The services and screenings done by your health care provider during your annual well check will depend on your age, overall health, lifestyle risk factors, and family history of disease. Counseling  Your health care provider may ask you questions about your: Alcohol use. Tobacco use. Drug use. Emotional well-being. Home and relationship well-being. Sexual activity. Eating  habits. History of falls. Memory and ability to understand (cognition). Work and work Statistician. Reproductive health. Screening  You may have the following tests or measurements: Height, weight, and BMI. Blood pressure. Lipid and cholesterol levels. These may be checked every 5 years, or more frequently if you are over 78 years old. Skin check. Lung cancer screening. You may have this screening every year starting at age 8 if you have a 30-pack-year history of smoking and currently smoke or have quit within the past 15 years. Fecal occult blood test (FOBT) of the stool. You may have this test every year starting at age 11. Flexible sigmoidoscopy or colonoscopy. You may have a sigmoidoscopy every 5 years or a colonoscopy every 10 years starting at age 24. Hepatitis C blood test. Hepatitis B blood test. Sexually transmitted disease (STD) testing. Diabetes screening. This is done by checking your blood sugar (glucose) after you have not eaten for a while (fasting). You may have this done every 1-3 years. Bone density scan. This is done to screen for osteoporosis. You may have this done starting at age 84. Mammogram. This may be done every 1-2 years. Talk to your health care provider about how often you should have regular mammograms. Talk with your health care provider about your test results, treatment options, and if necessary, the need for more tests. Vaccines  Your health care provider may recommend certain vaccines, such as: Influenza vaccine. This is recommended every year. Tetanus, diphtheria, and acellular pertussis (Tdap, Td) vaccine. You may need a Td booster every 10 years. Zoster vaccine. You may need this after age 82. Pneumococcal 13-valent conjugate (PCV13) vaccine. One dose is recommended after age 57. Pneumococcal polysaccharide (PPSV23) vaccine. One dose is recommended after age 64. Talk to your health care provider  about which screenings and vaccines you need and how  often you need them. This information is not intended to replace advice given to you by your health care provider. Make sure you discuss any questions you have with your health care provider. Document Released: 10/31/2015 Document Revised: 06/23/2016 Document Reviewed: 08/05/2015 Elsevier Interactive Patient Education  2017 [place] Prevention in the Home Falls can cause injuries. They can happen to people of all ages. There are many things you can do to make your home safe and to help prevent falls. What can I do on the outside of my home? Regularly fix the edges of walkways and driveways and fix any cracks. Remove anything that might make you trip as you walk through a door, such as a raised step or threshold. Trim any bushes or trees on the path to your home. Use bright outdoor lighting. Clear any walking paths of anything that might make someone trip, such as rocks or tools. Regularly check to see if handrails are loose or broken. Make sure that both sides of any steps have handrails. Any raised decks and porches should have guardrails on the edges. Have any leaves, snow, or ice cleared regularly. Use sand or salt on walking paths during winter. Clean up any spills in your garage right away. This includes oil or grease spills. What can I do in the bathroom? Use night lights. Install grab bars by the toilet and in the tub and shower. Do not use towel bars as grab bars. Use non-skid mats or decals in the tub or shower. If you need to sit down in the shower, use a plastic, non-slip stool. Keep the floor dry. Clean up any water that spills on the floor as soon as it happens. Remove soap buildup in the tub or shower regularly. Attach bath mats securely with double-sided non-slip rug tape. Do not have throw rugs and other things on the floor that can make you trip. What can I do in the bedroom? Use night lights. Make sure that you have a light by your bed that is easy to  reach. Do not use any sheets or blankets that are too big for your bed. They should not hang down onto the floor. Have a firm chair that has side arms. You can use this for support while you get dressed. Do not have throw rugs and other things on the floor that can make you trip. What can I do in the kitchen? Clean up any spills right away. Avoid walking on wet floors. Keep items that you use a lot in easy-to-reach places. If you need to reach something above you, use a strong step stool that has a grab bar. Keep electrical cords out of the way. Do not use floor polish or wax that makes floors slippery. If you must use wax, use non-skid floor wax. Do not have throw rugs and other things on the floor that can make you trip. What can I do with my stairs? Do not leave any items on the stairs. Make sure that there are handrails on both sides of the stairs and use them. Fix handrails that are broken or loose. Make sure that handrails are as long as the stairways. Check any carpeting to make sure that it is firmly attached to the stairs. Fix any carpet that is loose or worn. Avoid having throw rugs at the top or bottom of the stairs. If you do have throw rugs, attach them to the floor  with carpet tape. Make sure that you have a light switch at the top of the stairs and the bottom of the stairs. If you do not have them, ask someone to add them for you. What else can I do to help prevent falls? Wear shoes that: Do not have high heels. Have rubber bottoms. Are comfortable and fit you well. Are closed at the toe. Do not wear sandals. If you use a stepladder: Make sure that it is fully opened. Do not climb a closed stepladder. Make sure that both sides of the stepladder are locked into place. Ask someone to hold it for you, if possible. Clearly mark and make sure that you can see: Any grab bars or handrails. First and last steps. Where the edge of each step is. Use tools that help you move  around (mobility aids) if they are needed. These include: Canes. Walkers. Scooters. Crutches. Turn on the lights when you go into a dark area. Replace any light bulbs as soon as they burn out. Set up your furniture so you have a clear path. Avoid moving your furniture around. If any of your floors are uneven, fix them. If there are any pets around you, be aware of where they are. Review your medicines with your doctor. Some medicines can make you feel dizzy. This can increase your chance of falling. Ask your doctor what other things that you can do to help prevent falls. This information is not intended to replace advice given to you by your health care provider. Make sure you discuss any questions you have with your health care provider. Document Released: 07/31/2009 Document Revised: 03/11/2016 Document Reviewed: 11/08/2014 Elsevier Interactive Patient Education  2017 [employer].

## 2021-08-12 NOTE — Progress Notes (Signed)
Subjective:   Abigail Michael is a 70 y.o. female who presents for Medicare Annual (Subsequent) preventive examination.  I connected with  Abigail Michael on 08/12/21 by a telephone enabled telemedicine application and verified that I am speaking with the correct person using two identifiers.   I discussed the limitations of evaluation and management by telemedicine. The patient expressed understanding and agreed to proceed.    Review of Systems     Cardiac Risk Factors include: advanced age (>10men, >84 women);hypertension     Objective:    Today's Vitals   There is no height or weight on file to calculate BMI.  Advanced Directives 08/06/2020 07/17/2019 09/05/2018 08/22/2018  Does Patient Have a Medical Advance Directive? No Yes No No  Type of Advance Directive - Healthcare Power of [place];Living will - -  Copy of [place] in Chart? - No - copy requested - -  Would patient like information on creating a medical advance directive? Yes (MAU/Ambulatory/Procedural Areas - Information given) - No - Patient declined Yes (MAU/Ambulatory/Procedural Areas - Information given)    Current Medications (verified) Outpatient Encounter Medications as of 08/12/2021  Medication Sig   amLODipine (NORVASC) 10 MG tablet Take 1 tablet (10 mg total) by mouth daily.   aspirin EC 81 MG tablet Take 81 mg by mouth daily.    atorvastatin (LIPITOR) 40 MG tablet Take 1 tablet (40 mg total) by mouth daily.   buPROPion (WELLBUTRIN SR) 200 MG 12 hr tablet Take 1 tablet (200 mg total) by mouth 2 (two) times daily.   donepezil (ARICEPT) 10 MG tablet Take 1 tablet (10 mg total) by mouth daily.   EPINEPHrine 0.3 mg/0.3 mL IJ SOAJ injection Inject 0.3 mLs (0.3 mg total) into the skin as needed.   esomeprazole (NEXIUM) 40 MG capsule TAKE ONE CAPSULE BY MOUTH ONE TIME DAILY AT NOON   famotidine (PEPCID) 20 MG tablet Take 1 tablet (20 mg total) by mouth 2 (two) times daily.   FLUoxetine (PROZAC)  40 MG capsule Take 1 capsule (40 mg total) by mouth daily.   levothyroxine (SYNTHROID) 25 MCG tablet Take 1 tablet (25 mcg total) by mouth daily before breakfast. On an empty stomach   mirabegron ER (MYRBETRIQ) 50 MG TB24 tablet Take 1 tablet (50 mg total) by mouth daily.   OVER THE COUNTER MEDICATION Neuro-Alpha 2 caps QD   TURMERIC PO Take by mouth.    ondansetron (ZOFRAN) 4 MG tablet Take 1 tablet (4 mg total) by mouth every 8 (eight) hours as needed for nausea or vomiting.   No facility-administered encounter medications on file as of 08/12/2021.    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 many MRI w/ positive white matter abnormality. MRI reported stable since 2015. mild progressive  Cognitive loss   Obstructive sleep apnea    on prior neuro notes 10/2016; no results.    Osteoporosis    had been on reclast   Post  concussion syndrome 10/2013   ongoing issues with concentration, memory loss, lability; EEG completed and normal.    TBI (traumatic brain injury) 2014   fall at [facility], hit head off of floor. No bleed, "concussion". Seen neurology since for focus and word finding.    TIA (transient ischemic attack) 11/30/2016   MRI normal. No lateralization. Speech affected and weakness during a hypotensive episdoe.    Urinary incontinence    Past Surgical History:  Procedure Laterality Date   BACK SURGERY  02/2015   herniated disc L4/5/6   BREAST BIOPSY  1988   biopsy   COLONOSCOPY     [place] Around 2015   ESOPHAGOGASTRODUODENOSCOPY  2016    [place]   LAPAROSCOPIC HYSTERECTOMY     partial    SHOULDER ARTHROSCOPY W/ LABRAL REPAIR Left 02/13/2016   left shoulder   Study: cognitive eval  06/23/2016   mild cog-linguistic impairment, difficulty with verbal fluency   Study: EEG   04/12/2015   Studies: EEG completed and normal.    Study: MRI  04/28/2016   Impression: numerous non-enhancing flair hyperintense lesions w/in the brain .Suspicious for demyelinating disease such as lyme or MS. Her Lyme test were negative.    Family History  Problem Relation Age of Onset   Lung cancer Mother    Alcohol abuse Father    Throat cancer Father    Lung cancer Father    Depression Father    Heart attack Father    Breast cancer Sister    Alcohol abuse Brother    Diabetes Brother    Mental illness Sister    Stroke Daughter    Endometriosis Daughter    Heart disease Son    Breast cancer Maternal Aunt    Diabetes Maternal Grandmother    GER disease Daughter    Esophageal cancer Paternal Grandfather    Colon cancer Neg Hx    Rectal cancer Neg Hx    Stomach cancer Neg Hx    Social History   Socioeconomic History   Marital status: Single    Spouse name: Not on file   Number of children: 4   Years of education: two years of college   Highest education level: Not on file  Occupational History   Occupation: retired  Tobacco Use   Smoking status: Former    Packs/day: 1.00    Years: 19.00    Pack years: 19.00    Types: Cigarettes    Quit date: 2012    Years since quitting: 10.8   Smokeless tobacco: Never   Tobacco comments:    quit 6 years ago  Vaping Use   Vaping Use: Never used  Substance and Sexual Activity   Alcohol use: Yes    Alcohol/week: 1.0 standard drink    Types: 1 Glasses of wine per week    Comment: twice a month   Drug use: Never   Sexual activity: Not Currently    Partners: Male  Other Topics Concern   Not on file  Social History Narrative   Single. Lived in [place] area, moved to [place] to be close to  her daughters 2018. Lives with her daughter.   Some college, worked as Programmer, systems. Retired.    Social drinker. Former smoker.   Exercises routinely.    Wears dentures.    Drinks caffeine, takes a daily vitamin.   Smoke alarm in the home. Wears her seatbelt.    Feels safe in her relationships.   3-4 cups per day.  Four children (2 biological, 2 she raised as her own).   Social Determinants of Health   Financial Resource Strain: Low Risk    Difficulty of Paying Living Expenses: Not hard at all  Food Insecurity: No Food Insecurity   Worried About Charity fundraiser in the Last Year: Never true   [place] in the Last Year: Never true  Transportation Needs: No Transportation Needs   Lack of Transportation (Medical): No   Lack of Transportation (Non-Medical): No  Physical Activity: Insufficiently Active   Days of Exercise per Week: 3 days   Minutes of Exercise per Session: 30 min  Stress: No Stress Concern Present   Feeling of Stress : Not at all  Social Connections: Moderately Isolated   Frequency of Communication with Friends and Family: More than three times a week   Frequency of Social Gatherings with Friends and Family: More than three times a week   Attends Religious Services: Never   Marine scientist or Organizations: No   Attends Music therapist: More than 4 times per year   Marital Status: Divorced    Tobacco Counseling Counseling given: Not Answered Tobacco comments: quit 6 years ago   Clinical Intake:  Pre-visit preparation completed: No  Pain : No/denies pain     Nutritional Risks: None Diabetes: No  How often do you need to have someone help you when you read instructions, pamphlets, or other written materials from your doctor or pharmacy?: 1 - Never  Diabetic?  No  Interpreter Needed?: No  Information entered by :: Leroy Kennedy LPN   Activities of Daily Living In your present state of health, do you have any difficulty  performing the following activities: 08/12/2021  Hearing? Y  Vision? N  Difficulty concentrating or making decisions? N  Walking or climbing stairs? N  Dressing or bathing? N  Doing errands, shopping? N  Preparing Food and eating ? N  Using the Toilet? N  In the past six months, have you accidently leaked urine? N  Do you have problems with loss of bowel control? N  Managing your Medications? N  Managing your Finances? N  Housekeeping or managing your Housekeeping? N  Some recent data might be hidden    Patient Care Team: Ma Hillock, DO as PCP - General (Family Medicine) Sharlene Dory, MD as Referring Physician (Neurology) Tommi Rumps (Dentistry) Wallace Going (Orthodontics) Feliberto Harts R (Inactive) (Oral Surgery) Basuroski, Jacqulynn Cadet, MD as Referring Physician (Psychiatry)  Indicate any recent Medical Services you may have received from other than [facility] providers in the past year (date may be approximate).     Assessment:   This is a routine wellness examination for [place].  Hearing/Vision screen Hearing Screening - Comments:: Hearing aids  bilateral Vision Screening - Comments:: Dr. Joya San Up to date  Dietary issues and exercise activities discussed: Current Exercise Habits: Home exercise routine, Type of exercise: stretching;walking (sits ups,), Time (Minutes): 25, Frequency (Times/Week): 4, Weekly Exercise (Minutes/Week): 100, Intensity: Mild   Goals Addressed             This Visit's Progress    Patient Stated   On track    Be more active by walking more & eat healthier     Patient Stated       Maintain current healthy lifestyle       Depression Screen PHQ 2/9 Scores 08/12/2021 07/30/2021 07/03/2021 12/02/2020 08/06/2020 07/01/2020 03/05/2020  PHQ - 2 Score 0 0 4 0  0 0 0  PHQ- 9 Score 0 - 7 0 - 0 1    Fall Risk Fall Risk  08/12/2021 07/30/2021 08/06/2020 07/17/2019 05/07/2019  Falls in the past year? - 0 0 0 0  Comment - - - - -  Number falls in past  yr: 0 0 0 0 -  Injury with Fall? 0 0 0 0 -  Risk for fall due to : - - - - -  Follow up Falls evaluation completed;Falls prevention discussed Falls evaluation completed Falls prevention discussed Falls prevention discussed Falls evaluation completed    FALL RISK PREVENTION PERTAINING TO THE HOME:  Any stairs in or around the home? No  If so, are there any without handrails? No  Home free of loose throw rugs in walkways, pet beds, electrical cords, etc? Yes  Adequate lighting in your home to reduce risk of falls? Yes   ASSISTIVE DEVICES UTILIZED TO PREVENT FALLS:  Life alert? No  Use of a cane, walker or w/c? No  Grab bars in the bathroom? No  Shower chair or bench in shower? No  Elevated toilet seat or a handicapped toilet? No   TIMED UP AND GO:  Was the test performed? No .    Cognitive Function:  Normal cognitive status assessed by direct observation by this Nurse Health Advisor. No abnormalities found.     MMSE - Mini Mental State Exam 01/06/2021 12/19/2019 07/17/2019  Orientation to time 5 5 5   Orientation to Place 5 5 5   Registration 3 3 3   Attention/ Calculation 1 5 5   Recall 3 3 1   Language- name 2 objects 2 2 2   Language- repeat 1 1 1   Language- follow 3 step command 3 3 3   Language- read & follow direction 1 1 1   Write a sentence 1 1 1   Copy design 1 1 1   Total score 26 30 28      6CIT Screen 08/06/2020  What Year? 0 points  What month? 0 points  What time? 0 points  Count back from 20 0 points  Months in reverse 0 points  Repeat phrase 0 points  Total Score 0    Immunizations Immunization History  Administered Date(s) Administered   Fluad Quad(high Dose 65+) 09/16/2020, 07/03/2021   Influenza, High Dose Seasonal PF 08/28/2018, 06/13/2019   PFIZER(Purple Top)SARS-COV-2 Vaccination 11/09/2019, 12/03/2019, 09/16/2020   Pneumococcal Conjugate-13 12/02/2020   Pneumococcal Polysaccharide-23 08/28/2018   Zoster Recombinat (Shingrix) 05/10/2019, 09/10/2019     TDAP status: Due, Education has been provided regarding the importance of this vaccine. Advised may receive this vaccine at local pharmacy or Health Dept. Aware to provide a copy of the vaccination record if obtained from local pharmacy or Health Dept. Verbalized acceptance and understanding.  Flu Vaccine status: Up to date  Pneumococcal vaccine status: Up to date  Covid-19 vaccine status: Information provided on how to obtain vaccines.   Qualifies for Shingles Vaccine? No   Zostavax completed No   Shingrix Completed?: Yes  Screening Tests Health Maintenance  Topic Date Due   COVID-19 Vaccine (4 - Booster for Pfizer series) 08/15/2021 (Originally 11/11/2020)   TETANUS/TDAP  12/02/2021 (Originally 10/13/1970)   COLONOSCOPY (Pts 45-43yrs Insurance coverage will need to be confirmed)  09/04/2022   MAMMOGRAM  10/14/2022   Pneumonia Vaccine 18+ Years old  Completed   INFLUENZA VACCINE  Completed   DEXA SCAN  Completed   Hepatitis C Screening  Completed   Zoster Vaccines- Shingrix  Completed   HPV VACCINES  Aged Out    Health Maintenance  There are no preventive care reminders to display for this patient.  Colorectal cancer screening: Type of screening: Colonoscopy. Completed 2020. Repeat every 3 years  Mammogram status: Ordered  . Pt provided with contact info and advised to call to schedule appt.   Bone Density status: Completed 2020. Results reflect: Bone density results: OSTEOPENIA. Repeat every 2 years.  Lung Cancer Screening: (Low Dose CT Chest recommended if Age 19-80 years, 30 pack-year currently smoking OR have quit w/in 15years.) does not qualify.   Lung Cancer Screening Referral:   Additional Screening:  Hepatitis C Screening: does not qualify; Completed 2020  Vision Screening: Recommended annual ophthalmology exams for early detection of glaucoma and other disorders of the eye. Is the patient up to date with their annual eye exam?  Yes  Who is the provider or  what is the name of the office in which the patient attends annual eye exams? Dr. Joya San If pt is not established with a provider, would they like to be referred to a provider to establish care? No .   Dental Screening: Recommended annual dental exams for proper oral hygiene  Community Resource Referral / Chronic Care Management: CRR required this visit?  No   CCM required this visit?  No      Plan:     I have personally reviewed and noted the following in the patient's chart:   Medical and social history Use of alcohol, tobacco or illicit drugs  Current medications and supplements including opioid prescriptions.  Functional ability and status Nutritional status Physical activity Advanced directives List of other physicians Hospitalizations, surgeries, and ER visits in previous 12 months Vitals Screenings to include cognitive, depression, and falls Referrals and appointments  In addition, I have reviewed and discussed with patient certain preventive protocols, quality metrics, and best practice recommendations. A written personalized care plan for preventive services as well as general preventive health recommendations were provided to patient.     Leroy Kennedy, LPN   [id number]   Nurse Notes:

## 2021-08-25 ENCOUNTER — Ambulatory Visit (INDEPENDENT_AMBULATORY_CARE_PROVIDER_SITE_OTHER): Payer: Medicare Other | Admitting: Psychology

## 2021-08-25 DIAGNOSIS — F4323 Adjustment disorder with mixed anxiety and depressed mood: Secondary | ICD-10-CM | POA: Diagnosis not present

## 2021-09-22 ENCOUNTER — Ambulatory Visit (INDEPENDENT_AMBULATORY_CARE_PROVIDER_SITE_OTHER): Payer: Medicare Other | Admitting: Psychology

## 2021-09-22 DIAGNOSIS — F4323 Adjustment disorder with mixed anxiety and depressed mood: Secondary | ICD-10-CM

## 2021-09-26 DIAGNOSIS — S93402A Sprain of unspecified ligament of left ankle, initial encounter: Secondary | ICD-10-CM | POA: Diagnosis not present

## 2021-09-26 DIAGNOSIS — S93602A Unspecified sprain of left foot, initial encounter: Secondary | ICD-10-CM | POA: Diagnosis not present

## 2021-10-07 ENCOUNTER — Ambulatory Visit
Admission: RE | Admit: 2021-10-07 | Discharge: 2021-10-07 | Disposition: A | Discharge status: Home / Self Care | Payer: Medicare Other | Source: Ambulatory Visit | Attending: Family Medicine | Admitting: Family Medicine

## 2021-10-07 DIAGNOSIS — Z780 Asymptomatic menopausal state: Secondary | ICD-10-CM | POA: Diagnosis not present

## 2021-10-07 DIAGNOSIS — M8589 Other specified disorders of bone density and structure, multiple sites: Secondary | ICD-10-CM | POA: Diagnosis not present

## 2021-10-08 ENCOUNTER — Telehealth: Payer: Self-pay | Admitting: Family Medicine

## 2021-10-08 ENCOUNTER — Ambulatory Visit: Payer: Medicare Other | Admitting: Psychology

## 2021-10-08 DIAGNOSIS — M810 Age-related osteoporosis without current pathological fracture: Secondary | ICD-10-CM

## 2021-10-08 NOTE — Telephone Encounter (Signed)
Spoke with pt regarding medication and instructions. Pt will like to proceed with referral.

## 2021-10-08 NOTE — Telephone Encounter (Signed)
Spoke with pt regarding labs and instructions. Pt would like to try reclast again.

## 2021-10-08 NOTE — Telephone Encounter (Signed)
Please inform patient the following information: Her bone density resulted with osteopenia, bone density loss. It is just about the same as last scan. Her score last time was -2.2 and it is -2.3 this time. There is a medication that is taking once weekly that can be started to help her bone density loss. If she would like to start fosamax, I will call this in for her today.   Basic Osteopenia/osteoporosis recs: 1.) Drink alcohol in moderation only 2.) Decrease caffeine consumption to no  More than 2.5 cups of coffee a day 3.) Exercise: weight bearing (walking counts), strength and balance training. 4.) No smoking.  5.) Sunlight/Ultraviolet light exposure 30 minutes a day/5 days a week. 6.) Vitamin D supplement of 1000u daily.

## 2021-10-08 NOTE — Telephone Encounter (Signed)
Reclast is an infusion that requires a specialty team. It also can only be given every 2 year for 3 doses, before needing to switch med type.   If she feels she still meets criteria and desires reclast, we can refer to a specialist.       GC: if she still desires reclast> please refer to [facility]- endocrine for reclast.   thanks

## 2021-10-08 NOTE — Addendum Note (Signed)
Addended by: Kavin Leech on: 10/08/2021 10:10 AM   Modules accepted: Orders

## 2021-10-16 ENCOUNTER — Ambulatory Visit
Admission: RE | Admit: 2021-10-16 | Discharge: 2021-10-16 | Disposition: A | Discharge status: Home / Self Care | Payer: Medicare Other | Source: Ambulatory Visit | Attending: Family Medicine | Admitting: Family Medicine

## 2021-10-16 DIAGNOSIS — Z1231 Encounter for screening mammogram for malignant neoplasm of breast: Secondary | ICD-10-CM | POA: Diagnosis not present

## 2021-10-18 HISTORY — PX: OTHER SURGICAL HISTORY: SHX169

## 2021-11-05 DIAGNOSIS — I10 Essential (primary) hypertension: Secondary | ICD-10-CM | POA: Diagnosis not present

## 2021-11-05 DIAGNOSIS — M8580 Other specified disorders of bone density and structure, unspecified site: Secondary | ICD-10-CM | POA: Diagnosis not present

## 2021-11-05 DIAGNOSIS — E039 Hypothyroidism, unspecified: Secondary | ICD-10-CM | POA: Diagnosis not present

## 2021-11-10 ENCOUNTER — Ambulatory Visit (INDEPENDENT_AMBULATORY_CARE_PROVIDER_SITE_OTHER): Payer: Medicare Other | Admitting: Family Medicine

## 2021-11-10 ENCOUNTER — Other Ambulatory Visit: Payer: Self-pay

## 2021-11-10 ENCOUNTER — Encounter: Payer: Self-pay | Admitting: Family Medicine

## 2021-11-10 VITALS — BP 140/78 | Temp 98.0°F | Ht 62.0 in | Wt 144.5 lb

## 2021-11-10 DIAGNOSIS — J029 Acute pharyngitis, unspecified: Secondary | ICD-10-CM | POA: Diagnosis not present

## 2021-11-10 LAB — POC COVID19 BINAXNOW: SARS Coronavirus 2 Ag: NEGATIVE

## 2021-11-10 LAB — POCT RAPID STREP A (OFFICE): Rapid Strep A Screen: NEGATIVE

## 2021-11-10 NOTE — Patient Instructions (Signed)
°  You can take tylenol or ibuprofen for pain/fevers If worsening symptoms, let us know or go to the Emergency room   Garggles, heat wrap.  Sucrets, etc.

## 2021-11-10 NOTE — Progress Notes (Signed)
Subjective:     Patient ID: Abigail Michael, female    DOB: Mar 16, 1951, 71 y.o.   MRN: [id number]  Chief Complaint  Patient presents with   Sore Throat    Started 2 days ago    Ear Pain    Right ear pain     HPI-going to [place] on monday Chief complaint: sore throat Symptom onset: 2 days Pertinent positives: sore throat, R ear pain Pertinent negatives: no f/c,cough/sob/runny nose/congestion Treatments tried: none Vaccine status: flu and covid inc booster Sick exposure: none   There are no preventive care reminders to display for this patient.  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 many MRI w/ positive white matter abnormality. MRI reported stable since 2015. mild progressive  Cognitive loss   Obstructive sleep apnea    on prior neuro notes 10/2016; no results.    Osteoporosis    had been on reclast   Post concussion syndrome 10/2013   ongoing issues with concentration, memory loss, lability; EEG completed and normal.    TBI (traumatic brain injury) 2014   fall at [facility], hit head off of floor. No bleed, "concussion". Seen neurology since for focus and word finding.    TIA (transient ischemic attack) 11/30/2016   MRI normal. No lateralization. Speech affected and weakness during a hypotensive episdoe.    Urinary incontinence     Past Surgical History:  Procedure Laterality Date   BACK SURGERY  02/2015   herniated disc L4/5/6   BREAST BIOPSY  1988   biopsy   COLONOSCOPY     [place]. Around 2015   ESOPHAGOGASTRODUODENOSCOPY  2016   [place]   LAPAROSCOPIC HYSTERECTOMY     partial     SHOULDER ARTHROSCOPY W/ LABRAL REPAIR Left 02/13/2016   left shoulder   Study: cognitive eval  06/23/2016   mild cog-linguistic impairment, difficulty with verbal fluency   Study: EEG   04/12/2015   Studies: EEG completed and normal.    Study: MRI  04/28/2016   Impression: numerous non-enhancing flair hyperintense lesions w/in the brain .Suspicious for demyelinating disease such as lyme or MS. Her Lyme test were negative.     Outpatient Medications Prior to Visit  Medication Sig Dispense Refill   amLODipine (NORVASC) 10 MG tablet Take 1 tablet (10 mg total) by mouth daily. 90 tablet 1   aspirin EC 81 MG tablet Take 81 mg by mouth daily.      atorvastatin (LIPITOR) 40 MG tablet Take 1 tablet (40 mg total) by mouth daily. 90 tablet 3   buPROPion (WELLBUTRIN SR) 200 MG 12 hr tablet Take 1 tablet (200 mg total) by mouth 2 (two) times daily. 180 tablet 1   Cholecalciferol (VITAMIN D) 50 MCG (2000 UT) tablet      donepezil (ARICEPT) 10 MG tablet Take 1 tablet (10 mg total) by mouth daily. 90 tablet 3   EPINEPHrine 0.3 mg/0.3 mL IJ SOAJ injection Inject 0.3 mLs (0.3 mg total) into the skin as needed. 1 each 1  esomeprazole (NEXIUM) 40 MG capsule TAKE ONE CAPSULE BY MOUTH ONE TIME DAILY AT NOON 90 capsule 1   famotidine (PEPCID) 20 MG tablet Take 1 tablet (20 mg total) by mouth 2 (two) times daily. 180 tablet 1   FLUoxetine (PROZAC) 40 MG capsule Take 1 capsule (40 mg total) by mouth daily. 90 capsule 1   HYDROcodone-acetaminophen (NORCO/VICODIN) 5-325 MG tablet Take 1 tablet by mouth every 4 (four) hours as needed.     levothyroxine (SYNTHROID) 25 MCG tablet Take 1 tablet (25 mcg total) by mouth daily before breakfast. On an empty stomach 90 tablet 3   mirabegron ER (MYRBETRIQ) 50 MG TB24 tablet Take 1 tablet (50 mg total) by mouth daily. 90 tablet 3   Omega 3 1200 MG CAPS      OVER THE COUNTER MEDICATION Neuro-Alpha 2 caps QD     TURMERIC PO Take by mouth.      ondansetron (ZOFRAN) 4 MG tablet  Take 1 tablet (4 mg total) by mouth every 8 (eight) hours as needed for nausea or vomiting. 20 tablet 0   No facility-administered medications prior to visit.    Allergies  Allergen Reactions   Bee Venom Anaphylaxis    Other reaction(s): Anaphylaxis   Amoxicillin Dermatitis    Yeast infection Other reaction(s): Dermatitis/Yeast Infection   EHU:[id number] except as noted in HPI      Objective:     BP 140/78 (BP Location: Right Arm, Patient Position: Sitting, Cuff Size: Normal)    Temp 98 F (36.7 C) (Temporal)    Ht 5\' 2"  (1.575 m)    Wt 144 lb 8 oz (65.5 kg)    LMP 10/18/1990    SpO2 97%    BMI 26.43 kg/m  Wt Readings from Last 3 Encounters:  11/10/21 144 lb 8 oz (65.5 kg)  07/30/21 139 lb (63 kg)  07/03/21 140 lb (63.5 kg)        Gen: WDWN NAD wf HEENT: NCAT, conjunctiva not injected, sclera nonicteric TM WNL B, OP moist, no exudates  NECK:  supple, no thyromegaly, no nodes, no carotid bruits CARDIAC: RRR, S1S2+, no murmur. DP 2+B LUNGS: CTAB. No wheezes EXT:  no edema MSK: no gross abnormalities.  NEURO: A&O x3.  CN II-XII intact.  PSYCH: normal mood. Good eye contact  Results for orders placed or performed in visit on 11/10/21  POCT rapid strep A  Result Value Ref Range   Rapid Strep A Screen Negative Negative  POC COVID-19  Result Value Ref Range   SARS Coronavirus 2 Ag Negative Negative     Assessment & Plan:   Problem List Items Addressed This Visit   None Visit Diagnoses     Sore throat    -  Primary   Relevant Orders   POCT rapid strep A (Completed)   POC COVID-19 (Completed)     1  sore throat-nothing seen.  ? Viral, canker sore starting, other.  Symptomatic tx.  Keep [place] informed of any changes  No orders of the defined types were placed in this encounter.   Wellington Hampshire, MD

## 2021-11-11 ENCOUNTER — Ambulatory Visit (INDEPENDENT_AMBULATORY_CARE_PROVIDER_SITE_OTHER): Payer: Medicare Other | Admitting: Psychology

## 2021-11-11 DIAGNOSIS — F4323 Adjustment disorder with mixed anxiety and depressed mood: Secondary | ICD-10-CM

## 2021-11-11 DIAGNOSIS — M8580 Other specified disorders of bone density and structure, unspecified site: Secondary | ICD-10-CM | POA: Diagnosis not present

## 2021-11-11 NOTE — Progress Notes (Signed)
[facility] Counselor/Therapist Progress Note  Patient ID: Abigail Michael, MRN: [id number],    Date: 11/11/2021  Time Spent: 10:00am-10:50am   50 minutes   Treatment Type: Individual Therapy  Reported Symptoms: sadness, anxiety  Mental Status Exam: Appearance:  Well Groomed     Behavior: Appropriate  Motor: Normal  Speech/Language:  Normal Rate  Affect: Appropriate  Mood: normal  Thought process: normal  Thought content:   WNL  Sensory/Perceptual disturbances:   WNL  Orientation: oriented to person, place, time/date, and situation  Attention: Good  Concentration: Good  Memory: WNL  Fund of knowledge:  Good  Insight:   Good  Judgment:  Good  Impulse Control: Good   Risk Assessment: Danger to Self:  No Self-injurious Behavior: No Danger to Others: No Duty to Warn:no Physical Aggression / Violence:No  Access to Firearms a concern: No  Gang Involvement:No   Subjective: Pt Abigail Michael present for face-to-face individual therapy in person.  Location of pt: office Location of therapist: office.  Pt states she has been very busy helping her 2 daughters.  Pt is grateful that she lives so close to them.   Pt states she has been doing good overall.  She likes being in her own apartment.  She has met some of her neighbors and they are nice.  She is feeling more independent.  Pt states she does not have a lot of confidence.  Pt gets afraid that she will be judged.  One of pt's daughters can tend to judge her.  Pt has always been a people pleaser.  Addressed pt's confidence issues.   At times pt feels anxiety.  Addressed what triggers fear and anxiety for pt.  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  Pt goes to cranial sacrel therapy the past two years which has been helpful for pt.   However she still has deficits in her memory and focus.  Addressed pt's concerns about her memory.  Addressed pt's concerns about not being  able to remember conversations.  Worked on strategies for improving her memory and encouraged pt to write things down.   Pt has been going out a lot dancing.  She loves to dance.  She has trouble following directions for ballroom dancing classes in a group.  She will transition to individual lessons.  Pt has been involved in a meet up group that has helped give her some positive social time.   Pt has a strong faith that she relies on to cope.   Pt talked about wanting to find a relationship with a man but she is concerned bc she has not had the best past experiences.  Addressed how pt can move slowly and take care of herself.   Provided supportive therapy.   Interventions: Cognitive Behavioral Therapy and Insight-Oriented  Diagnosis: F43.23  Plan: See pt's Treatment Plan for depression and anxiety in Therapy Charts.  (Treatment Plan Target Date: 08/25/2022) Pt is progressing toward treatment goals.   Plan to continue to see pt every two weeks.    Mayford Alberg, LCSW

## 2021-11-25 ENCOUNTER — Ambulatory Visit (INDEPENDENT_AMBULATORY_CARE_PROVIDER_SITE_OTHER): Payer: Medicare Other | Admitting: Psychology

## 2021-11-25 DIAGNOSIS — F4323 Adjustment disorder with mixed anxiety and depressed mood: Secondary | ICD-10-CM

## 2021-11-25 NOTE — Progress Notes (Signed)
[facility] Counselor/Therapist Progress Note  Patient ID: Abigail Michael, MRN: [id number],    Date: 11/25/2021  Time Spent: 10:00am-10:55am   55 minutes   Treatment Type: Individual Therapy  Reported Symptoms: sadness, anxiety  Mental Status Exam: Appearance:  Well Groomed     Behavior: Appropriate  Motor: Normal  Speech/Language:  Normal Rate  Affect: Appropriate  Mood: normal  Thought process: normal  Thought content:   WNL  Sensory/Perceptual disturbances:   WNL  Orientation: oriented to person, place, time/date, and situation  Attention: Good  Concentration: Good  Memory: WNL  Fund of knowledge:  Good  Insight:   Good  Judgment:  Good  Impulse Control: Good   Risk Assessment: Danger to Self:  No Self-injurious Behavior: No Danger to Others: No Duty to Warn:no Physical Aggression / Violence:No  Access to Firearms a concern: No  Gang Involvement:No   Subjective: Pt Abigail Michael present for face-to-face individual therapy in person.  Location of pt: office Location of therapist: office.  Pt talked about feeling stressed bc she has trouble with directions and keeping appointment times straight.  Pt gets frustrated and angry with herself about forgetting things.  Addressed how hard pt can be with herself.  Worked on Solicitor and compassion.   It is hard for pt to feel patient with herself.   Pt is learning to be ok with asking for help.  Pt's daughters are receptive to helping pt and keep in close contact with her.   Pt talked about going to [place] to visit a friend.  She had a good time with her friend.  Pt's friend is helpful and understanding about pt's deficits.   Pt talked about buying tickets to see the [employer] show.  She went to the show on her own.  She enjoyed it.  Acknowledged pt for taking the initiative to go to the show on her own.   Pt has plans to go out this weekend to listen to music and be with friends.  Pt plans to get a new sewing  machine so she can do sewing projects.   Pt wants to take some painting lessons as well.    At times pt feels anxiety.  Addressed what triggers fear and anxiety for pt.  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  Pt still has deficits in her memory and focus.  Addressed pt's concerns about her memory.  Addressed pt's concerns about not being able to remember conversations.  Worked on strategies for improving her memory and encouraged pt to write things down.   Provided supportive therapy.   Interventions: Cognitive Behavioral Therapy and Insight-Oriented  Diagnosis: F43.23  Plan: See pt's Treatment Plan for depression and anxiety in Therapy Charts.  (Treatment Plan Target Date: 08/25/2022) Pt is progressing toward treatment goals.   Plan to continue to see pt every two weeks.    Sefora Tietje, LCSW

## 2021-12-11 DIAGNOSIS — H2511 Age-related nuclear cataract, right eye: Secondary | ICD-10-CM | POA: Diagnosis not present

## 2021-12-11 DIAGNOSIS — H18413 Arcus senilis, bilateral: Secondary | ICD-10-CM | POA: Diagnosis not present

## 2021-12-11 DIAGNOSIS — H25043 Posterior subcapsular polar age-related cataract, bilateral: Secondary | ICD-10-CM | POA: Diagnosis not present

## 2021-12-11 DIAGNOSIS — H25013 Cortical age-related cataract, bilateral: Secondary | ICD-10-CM | POA: Diagnosis not present

## 2021-12-11 DIAGNOSIS — H2513 Age-related nuclear cataract, bilateral: Secondary | ICD-10-CM | POA: Diagnosis not present

## 2021-12-16 ENCOUNTER — Ambulatory Visit (INDEPENDENT_AMBULATORY_CARE_PROVIDER_SITE_OTHER): Payer: Medicare Other | Admitting: Family Medicine

## 2021-12-16 ENCOUNTER — Other Ambulatory Visit: Payer: Self-pay

## 2021-12-16 ENCOUNTER — Encounter: Payer: Self-pay | Admitting: Family Medicine

## 2021-12-16 VITALS — BP 134/73 | HR 64 | Temp 98.1°F | Ht 62.0 in | Wt 141.0 lb

## 2021-12-16 DIAGNOSIS — G3281 Cerebellar ataxia in diseases classified elsewhere: Secondary | ICD-10-CM | POA: Diagnosis not present

## 2021-12-16 DIAGNOSIS — E034 Atrophy of thyroid (acquired): Secondary | ICD-10-CM

## 2021-12-16 DIAGNOSIS — Z5181 Encounter for therapeutic drug level monitoring: Secondary | ICD-10-CM | POA: Diagnosis not present

## 2021-12-16 DIAGNOSIS — I10 Essential (primary) hypertension: Secondary | ICD-10-CM

## 2021-12-16 DIAGNOSIS — N3281 Overactive bladder: Secondary | ICD-10-CM

## 2021-12-16 DIAGNOSIS — E782 Mixed hyperlipidemia: Secondary | ICD-10-CM | POA: Diagnosis not present

## 2021-12-16 DIAGNOSIS — N1831 Chronic kidney disease, stage 3a: Secondary | ICD-10-CM | POA: Diagnosis not present

## 2021-12-16 DIAGNOSIS — K219 Gastro-esophageal reflux disease without esophagitis: Secondary | ICD-10-CM

## 2021-12-16 DIAGNOSIS — E210 Primary hyperparathyroidism: Secondary | ICD-10-CM | POA: Insufficient documentation

## 2021-12-16 DIAGNOSIS — S069X9S Unspecified intracranial injury with loss of consciousness of unspecified duration, sequela: Secondary | ICD-10-CM

## 2021-12-16 DIAGNOSIS — F419 Anxiety disorder, unspecified: Secondary | ICD-10-CM | POA: Diagnosis not present

## 2021-12-16 DIAGNOSIS — F330 Major depressive disorder, recurrent, mild: Secondary | ICD-10-CM | POA: Diagnosis not present

## 2021-12-16 DIAGNOSIS — M810 Age-related osteoporosis without current pathological fracture: Secondary | ICD-10-CM

## 2021-12-16 DIAGNOSIS — Z79899 Other long term (current) drug therapy: Secondary | ICD-10-CM

## 2021-12-16 MED ORDER — ATORVASTATIN CALCIUM 40 MG PO TABS: 40.0000 mg | ORAL_TABLET | Freq: Every day | ORAL | 3 refills | Status: DC

## 2021-12-16 MED ORDER — AMLODIPINE BESYLATE 10 MG PO TABS: 10.0000 mg | ORAL_TABLET | Freq: Every day | ORAL | 1 refills | Status: DC

## 2021-12-16 MED ORDER — FAMOTIDINE 20 MG PO TABS: 20.0000 mg | ORAL_TABLET | Freq: Two times a day (BID) | ORAL | 1 refills | Status: DC

## 2021-12-16 MED ORDER — BUPROPION HCL ER (SR) 200 MG PO TB12: 200.0000 mg | ORAL_TABLET | Freq: Two times a day (BID) | ORAL | 1 refills | Status: DC

## 2021-12-16 MED ORDER — FLUOXETINE HCL 40 MG PO CAPS: 40.0000 mg | ORAL_CAPSULE | Freq: Every day | ORAL | 1 refills | Status: DC

## 2021-12-16 MED ORDER — ESOMEPRAZOLE MAGNESIUM 40 MG PO CPDR: DELAYED_RELEASE_CAPSULE | ORAL | 1 refills | Status: DC

## 2021-12-16 NOTE — Patient Instructions (Signed)
? ? ?  Next appt in 5.5 mos- we will collect labs that appt.  ?

## 2021-12-16 NOTE — Progress Notes (Signed)
Patient ID: Abigail Michael, female  DOB: [birth year], 71 y.o.   MRN: [id number] Patient Care Team    Relationship Specialty Notifications Start End  Ma Hillock, DO PCP - General Family Medicine  01/16/18   Sharlene Dory, MD Referring Physician Neurology  08/28/18   Tommi Rumps  Dentistry  07/17/19   [name]  Orthodontics  07/17/19   Feliberto Harts R (Inactive)  Oral Surgery  07/17/19   Delanna Ahmadi, MD Referring Physician Psychiatry  07/17/19    Comment: Neurology  Darleen Crocker, MD Consulting Physician Ophthalmology  12/16/21    Comment: cataract    Chief Complaint  Patient presents with   Depression    Cmc; pt is not fasting    Subjective: Abigail Michael is a 71 y.o.  female present for follow up cmc Depression with anxiety Patient reports she is feeling great on Wellbutrin and prozac.  She had a therapist that she enjoyed seeing, however that therapist has since left that practice.  She is still in a group of online people within her age bracket they get together and go out to eat, go bowling etc. she now has her own apartment and is enjoying decorating it.  She reports she has found some new friends and they go out and dance a few times a week.    Essential hypertension/HLD/morbid obesity/h/o TIA: Pt reports compliance with amlodipine 10 mg QD.Blood pressures ranges at home are within normal range. Patient denies chest pain, shortness of breath, dizziness or lower extremity edema.  Pt takes a  daily baby ASA. Pt is  prescribed statin. H/O TIA.  Diet: Low sodium Exercise: Patient reports she has become more active getting out walking more routinely and feeling better. RF: HTN, HLD, Obesity, h/o CVA, FHX MI   GERD/PPI:  Patient reports compliance with Nexium and Pepcid.    Marland Kitchen   Hypothyroidism:  Patient reports compliance with 25 mcg levothyroxine daily.   I  Overactive bladder/MS-white matter disease/TIA/TBI/ataxia: Patient reports compliance with her Myrbetriq and  it is working well or her.  Pt reports compliance with aricpet 10 mg qd.   Depression screen [facility] 2/9 12/16/2021 08/12/2021 07/30/2021 07/03/2021 12/02/2020  Decreased Interest 0 0 0 2 0  Down, Depressed, Hopeless 0 0 0 2 0  PHQ - 2 Score 0 0 0 4 0  Altered sleeping 0 0 - 0 0  Tired, decreased energy 0 0 - 0 0  Change in appetite 0 0 - 0 0  Feeling bad or failure about yourself  0 0 - 2 0  Trouble concentrating 0 0 - 0 0  Moving slowly or fidgety/restless 0 0 - 0 0  Suicidal thoughts 0 0 - 1 0  PHQ-9 Score 0 0 - 7 0  Difficult doing work/chores - - - - Not difficult at all  Some recent data might be hidden   GAD 7 : Generalized Anxiety Score 12/16/2021 07/03/2021 03/05/2020 01/23/2020  Nervous, Anxious, on Edge 0 2 0 3  Control/stop worrying 0 2 0 0  Worry too much - different things 0 2 0 1  Trouble relaxing 0 0 0 3  Restless 0 0 0 0  Easily annoyed or irritable 0 2 0 3  Afraid - awful might happen 0 2 0 0  Total GAD 7 Score 0 10 0 10  Anxiety Difficulty - - Not difficult at all Not difficult at all       Fall Risk  12/16/2021 12/12/2021 08/12/2021 07/30/2021  08/06/2020  Falls in the past year? 0 1 - 0 0  Comment - - - - -  Number falls in past yr: 0 1 0 0 0  Injury with Fall? 0 0 0 0 0  Risk for fall due to : No Fall Risks - - - -  Follow up Falls evaluation completed - Falls evaluation completed;Falls prevention discussed Falls evaluation completed Falls prevention discussed     Immunization History  Administered Date(s) Administered   Fluad Quad(high Dose 65+) 09/16/2020, 07/03/2021   Influenza, High Dose Seasonal PF 08/28/2018, 06/13/2019   PFIZER(Purple Top)SARS-COV-2 Vaccination 11/09/2019, 12/03/2019, 09/16/2020   Pneumococcal Conjugate-13 12/02/2020   Pneumococcal Polysaccharide-23 08/28/2018   Zoster Recombinat (Shingrix) 05/10/2019, 09/10/2019    No results found.  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 many MRI w/ positive white matter abnormality. MRI reported stable since 2015. mild progressive  Cognitive loss   Obstructive sleep apnea    on prior neuro notes 10/2016; no results.    Osteoporosis    had been on reclast   Post concussion syndrome 10/2013   ongoing issues with concentration, memory loss, lability; EEG completed and normal.    TBI (traumatic brain injury) 2014   fall at [facility], hit head off of floor. No bleed, "concussion". Seen neurology since for focus and word finding.    TIA (transient ischemic attack) 11/30/2016   MRI normal. No lateralization. Speech affected and weakness during a hypotensive episdoe.    Urinary incontinence    Allergies  Allergen Reactions   Bee Venom Anaphylaxis    Other reaction(s): Anaphylaxis   Amoxicillin Dermatitis    Yeast infection Other reaction(s): Dermatitis/Yeast Infection   Past Surgical History:  Procedure Laterality Date   BACK SURGERY  02/2015   herniated disc L4/5/6   BREAST BIOPSY  1988   biopsy   COLONOSCOPY     [place] Around 2015   ESOPHAGOGASTRODUODENOSCOPY  2016   [place]   LAPAROSCOPIC HYSTERECTOMY     partial    SHOULDER ARTHROSCOPY W/ LABRAL REPAIR Left 02/13/2016   left shoulder   Study: cognitive eval  06/23/2016   mild cog-linguistic impairment, difficulty with verbal fluency   Study: EEG   04/12/2015   Studies: EEG completed and normal.    Study: MRI  04/28/2016   Impression: numerous non-enhancing flair hyperintense lesions w/in the brain .Suspicious for demyelinating disease such as lyme or MS. Her Lyme test were negative.    Family History  Problem Relation Age of Onset   Lung  cancer Mother    Alcohol abuse Father    Throat cancer Father    Lung cancer Father    Depression Father    Heart attack Father    Breast cancer Sister    Alcohol abuse Brother    Diabetes Brother    Mental illness Sister    Stroke Daughter    Endometriosis Daughter    Heart disease Son    Breast cancer Maternal Aunt    Diabetes Maternal  Grandmother    GER disease Daughter    Esophageal cancer Paternal Grandfather    Colon cancer Neg Hx    Rectal cancer Neg Hx    Stomach cancer Neg Hx    Social History   Socioeconomic History   Marital status: Single    Spouse name: Not on file   Number of children: 4   Years of education: two years of college   Highest education level: GED or equivalent  Occupational History   Occupation: retired  Tobacco Use   Smoking status: Former    Packs/day: 1.00    Years: 19.00    Pack years: 19.00    Types: Cigarettes    Quit date: 2012    Years since quitting: 11.1   Smokeless tobacco: Never   Tobacco comments:    quit 6 years ago  Vaping Use   Vaping Use: Never used  Substance and Sexual Activity   Alcohol use: Yes    Alcohol/week: 1.0 standard drink    Types: 1 Glasses of wine per week    Comment: twice a month   Drug use: Never   Sexual activity: Not Currently    Partners: Male  Other Topics Concern   Not on file  Social History Narrative   Single. Lived in [place] area, moved to [place] to be close to her daughters 2018. Lives with her daughter.   Some college, worked as Programmer, systems. Retired.    Social drinker. Former smoker.   Exercises routinely.    Wears dentures.    Drinks caffeine, takes a daily vitamin.   Smoke alarm in the home. Wears her seatbelt.    Feels safe in her relationships.   3-4 cups per day.   Four children (2 biological, 2 she raised as her own).   Social Determinants of Health   Financial Resource Strain: Low Risk    Difficulty of Paying Living Expenses: Not hard at all  Food Insecurity: Unknown    Worried About Charity fundraiser in the Last Year: Patient refused   Arboriculturist in the Last Year: Patient refused  Transportation Needs: No Transportation Needs   Film/video editor (Medical): No   Lack of Transportation (Non-Medical): No  Physical Activity: Insufficiently Active   Days of Exercise per Week: 1 day   Minutes of Exercise per Session: 50 min  Stress: Unknown   Feeling of Stress : Patient refused  Social Connections: Unknown   Frequency of Communication with Friends and Family: More than three times a week   Frequency of Social Gatherings with Friends and Family: Once a week   Attends Religious Services: Patient refused   Marine scientist or Organizations: Yes   Attends Music therapist: More than 4 times per year   Marital Status: Patient refused  Intimate Partner Violence: Not At Risk   Fear of Current or Ex-Partner: No   Emotionally Abused: No   Physically Abused: No   Sexually Abused: No   Allergies as of 12/16/2021       Reactions   Bee Venom Anaphylaxis   Other reaction(s): Anaphylaxis   Amoxicillin Dermatitis   Yeast infection Other reaction(s): Dermatitis/Yeast Infection        Medication List        Accurate as of December 16, 2021 12:22 PM. If you have any questions, ask your nurse or doctor.          STOP taking these medications  HYDROcodone-acetaminophen 5-325 MG tablet Commonly known as: NORCO/VICODIN Stopped by: Howard Pouch, DO       TAKE these medications    amLODipine 10 MG tablet Commonly known as: NORVASC Take 1 tablet (10 mg total) by mouth daily.   aspirin EC 81 MG tablet Take 81 mg by mouth daily.   atorvastatin 40 MG tablet Commonly known as: LIPITOR Take 1 tablet (40 mg total) by mouth daily.   buPROPion 200 MG 12 hr tablet Commonly known as: Wellbutrin SR Take 1 tablet (200 mg total) by mouth 2 (two) times daily.   donepezil 10 MG tablet Commonly known as: ARICEPT Take 1 tablet (10  mg total) by mouth daily.   EPINEPHrine 0.3 mg/0.3 mL Soaj injection Commonly known as: EPI-PEN Inject 0.3 mLs (0.3 mg total) into the skin as needed.   esomeprazole 40 MG capsule Commonly known as: NEXIUM TAKE ONE CAPSULE BY MOUTH ONE TIME DAILY AT NOON   famotidine 20 MG tablet Commonly known as: PEPCID Take 1 tablet (20 mg total) by mouth 2 (two) times daily.   FLUoxetine 40 MG capsule Commonly known as: PROZAC Take 1 capsule (40 mg total) by mouth daily.   levothyroxine 25 MCG tablet Commonly known as: SYNTHROID Take 1 tablet (25 mcg total) by mouth daily before breakfast. On an empty stomach   mirabegron ER 50 MG Tb24 tablet Commonly known as: Myrbetriq Take 1 tablet (50 mg total) by mouth daily.   Omega 3 1200 MG Caps   OVER THE COUNTER MEDICATION Neuro-Alpha 2 caps QD   TURMERIC PO Take by mouth.   Vitamin D 50 MCG (2000 UT) tablet        All past medical history, surgical history, allergies, family history, immunizations andmedications were updated in the EMR today and reviewed under the history and medication portions of their EMR.     Recent Results (from the past 2160 hour(s))  POCT rapid strep A     Status: Normal   Collection Time: 11/10/21  3:31 PM  Result Value Ref Range   Rapid Strep A Screen Negative Negative  POC COVID-19     Status: Normal   Collection Time: 11/10/21  3:31 PM  Result Value Ref Range   SARS Coronavirus 2 Ag Negative Negative    Patient was never admitted.   ROS: 14 pt review of systems performed and negative (unless mentioned in an HPI)  Objective: BP 134/73    Pulse 64    Temp 98.1 F (36.7 C) (Oral)    Ht 5\' 2"  (1.575 m)    Wt 141 lb (64 kg)    LMP 10/18/1990    SpO2 100%    BMI 25.79 kg/m  Physical Exam Vitals and nursing note reviewed.  Constitutional:      General: She is not in acute distress.    Appearance: Normal appearance. She is not ill-appearing, toxic-appearing or diaphoretic.  HENT:     Head:  Normocephalic and atraumatic.  Eyes:     General: No scleral icterus.       Right eye: No discharge.        Left eye: No discharge.     Extraocular Movements: Extraocular movements intact.     Conjunctiva/sclera: Conjunctivae normal.     Pupils: Pupils are equal, round, and reactive to light.  Cardiovascular:     Rate and Rhythm: Normal rate and regular rhythm.     Heart sounds: No murmur heard.   No gallop.  Pulmonary:  Effort: Pulmonary effort is normal. No respiratory distress.     Breath sounds: Normal breath sounds. No wheezing, rhonchi or rales.  Musculoskeletal:     Cervical back: Neck supple.     Right lower leg: No edema.     Left lower leg: No edema.  Lymphadenopathy:     Cervical: No cervical adenopathy.  Skin:    General: Skin is warm and dry.     Coloration: Skin is not jaundiced or pale.     Findings: No erythema or rash.  Neurological:     Mental Status: She is alert and oriented to person, place, and time. Mental status is at baseline.     Motor: No weakness.     Gait: Gait normal.  Psychiatric:        Mood and Affect: Mood normal.        Behavior: Behavior normal.        Thought Content: Thought content normal.        Judgment: Judgment normal.     Assessment/plan: Abigail Michael is a 71 y.o. female present for [facility] Depression with anxiety Could use more coverage Continue fluoxetine 40 mg daily Increase Wellbutrin 150 mg twice daily to 200 mg twice a day - Referral to My[facility] Gianotti for therapy was placed > new referral placed today, this particular provider has since moved on and patient needs to establish as soon as possible with in-house provider. - Tried in the past: celexa and effexor.  - Follow-up in 5.5 months.   Essential hypertension/morbid obesity/HLD Stable.  Continue  amlodipine 10 mg daily to [facility]. Continue  atorvastatin 40 mg daily, refills called into [facility]. Continue  statin and ASA - cbc, cmp, ldl and tsh UTD- due next visit.   F/u 5.5 mos   Traumatic brain injury with loss of consciousness, sequela (HCC)/White matter disease/TIA//ataxia Stable.  - continue   Aricept 10 mg qd> managed by neuro She worked with physical therapy and did really well with improving her gait. Patient is established with neurology. overactive bladder continue  Myrbetriq 50 mg daily. >  Mail-in prescription Encounter for monitoring long-term proton pump inhibitor therapy/GERD Stable Continue omeprazole. Continue   Pepcid.  Hypothyroidism due to acquired atrophy of thyroid Continue levo 25 mcg Stage 3a chronic kidney disease ([place]) - stable.  Avoid nsaids when able.  Monitor yearly Hypercalcemia Chronic.hydrate - PTH, Intact and Calcium UTD- Due next appt   Return in about 24 weeks (around 06/02/2022) for CMC (30 min) w/ labs.   No orders of the defined types were placed in this encounter.   Meds ordered this encounter  Medications   amLODipine (NORVASC) 10 MG tablet    Sig: Take 1 tablet (10 mg total) by mouth daily.    Dispense:  90 tablet    Refill:  1   atorvastatin (LIPITOR) 40 MG tablet    Sig: Take 1 tablet (40 mg total) by mouth daily.    Dispense:  90 tablet    Refill:  3   buPROPion (WELLBUTRIN SR) 200 MG 12 hr tablet    Sig: Take 1 tablet (200 mg total) by mouth 2 (two) times daily.    Dispense:  180 tablet    Refill:  1   esomeprazole (NEXIUM) 40 MG capsule    Sig: TAKE ONE CAPSULE BY MOUTH ONE TIME DAILY AT NOON    Dispense:  90 capsule    Refill:  1   famotidine (PEPCID) 20 MG tablet  Sig: Take 1 tablet (20 mg total) by mouth 2 (two) times daily.    Dispense:  180 tablet    Refill:  1   FLUoxetine (PROZAC) 40 MG capsule    Sig: Take 1 capsule (40 mg total) by mouth daily.    Dispense:  90 capsule    Refill:  1    Referral Orders  No referral(s) requested today      Note is dictated utilizing voice recognition software. Although note has been proof read prior to signing, occasional  typographical errors still can be missed. If any questions arise, please do not hesitate to call for verification.  Electronically signed by: Howard Pouch, DO [place]

## 2021-12-18 ENCOUNTER — Ambulatory Visit (INDEPENDENT_AMBULATORY_CARE_PROVIDER_SITE_OTHER): Payer: Medicare Other | Admitting: Psychology

## 2021-12-18 DIAGNOSIS — F4323 Adjustment disorder with mixed anxiety and depressed mood: Secondary | ICD-10-CM

## 2021-12-18 NOTE — Progress Notes (Signed)
[facility] Counselor/Therapist Progress Note ? ?Patient ID: Abigail Michael, MRN: [id number],   ? ?Date: 12/18/2021 ? ?Time Spent: 11:00am-11:45am   45 minutes  ? ?Treatment Type: Individual Therapy ? ?Reported Symptoms: sadness, anxiety ? ?Mental Status Exam: ?Appearance:  Well Groomed     ?Behavior: Appropriate  ?Motor: Normal  ?Speech/Language:  Normal Rate  ?Affect: Appropriate  ?Mood: normal  ?Thought process: normal  ?Thought content:   WNL  ?Sensory/Perceptual disturbances:   WNL  ?Orientation: oriented to person, place, time/date, and situation  ?Attention: Good  ?Concentration: Good  ?Memory: WNL  ?Fund of knowledge:  Good  ?Insight:   Good  ?Judgment:  Good  ?Impulse Control: Good  ? ?Risk Assessment: ?Danger to Self:  No ?Self-injurious Behavior: No ?Danger to Others: No ?Duty to Warn:no ?Physical Aggression / Violence:No  ?Access to Firearms a concern: No  ?Gang Involvement:No  ? ?Subjective: Pt [name] present for face-to-face individual therapy via video Webex.  Pt consents to telehealth video session due to COVID 19 pandemic. ?Location of pt: home ?Location of therapist: home office.   ?Pt states she has been doing well overall.   She has been going out dancing.   She has also been trying to problem solve things on her own and is pleased that she has been successful doing that.   Pt is nudging herself to do things that are difficult for her.   ?Pt talked about health issues.   She is going to have cataract surgery in May.  Her kids and granddaughter will be able to help pt when she has surgery.   ?Pt has been trying to keep busy.  She is doing a good job of engaging in activities with her meet up group.  Pt is making new friends.   ?Pt gets frustrated and angry with herself at times about forgetting things.  Addressed how hard pt can be with herself.  Worked on Solicitor and compassion.   Encouraged pt to utilize reminder and organizational strategies to help her remember things.    ?Pt is trying to pace herself and take the time to write things down.   Pt reads outloud which helps her remember what she reads.   ?Worked on self care strategies.   ?Provided supportive therapy.  ? ?Interventions: Cognitive Behavioral Therapy and Insight-Oriented ? ?Diagnosis: F43.23 ? ?Plan: See pt's Treatment Plan for depression and anxiety in Therapy Charts.  (Treatment Plan Target Date: 08/25/2022) ?Pt is progressing toward treatment goals.   ?Plan to continue to see pt every two weeks.   ? ?Locke Barrell, LCSW ? ? ? ?

## 2021-12-24 DIAGNOSIS — J069 Acute upper respiratory infection, unspecified: Secondary | ICD-10-CM | POA: Diagnosis not present

## 2021-12-31 ENCOUNTER — Ambulatory Visit (INDEPENDENT_AMBULATORY_CARE_PROVIDER_SITE_OTHER): Payer: Medicare Other | Admitting: Psychology

## 2021-12-31 DIAGNOSIS — F4323 Adjustment disorder with mixed anxiety and depressed mood: Secondary | ICD-10-CM

## 2021-12-31 NOTE — Progress Notes (Signed)
[facility] Counselor/Therapist Progress Note ? ?Patient ID: Abigail Michael, MRN: [id number],   ? ?Date: 12/31/2021 ? ?Time Spent: 4:00pm-4:45pm   45 minutes  ? ?Treatment Type: Individual Therapy ? ?Reported Symptoms: stress ? ?Mental Status Exam: ?Appearance:  Well Groomed     ?Behavior: Appropriate  ?Motor: Normal  ?Speech/Language:  Normal Rate  ?Affect: Appropriate  ?Mood: normal  ?Thought process: normal  ?Thought content:   WNL  ?Sensory/Perceptual disturbances:   WNL  ?Orientation: oriented to person, place, time/date, and situation  ?Attention: Good  ?Concentration: Good  ?Memory: WNL  ?Fund of knowledge:  Good  ?Insight:   Good  ?Judgment:  Good  ?Impulse Control: Good  ? ?Risk Assessment: ?Danger to Self:  No ?Self-injurious Behavior: No ?Danger to Others: No ?Duty to Warn:no ?Physical Aggression / Violence:No  ?Access to Firearms a concern: No  ?Gang Involvement:No  ? ?Subjective: Pt Abigail Michael present for face-to-face individual therapy in person. ?Pt states she keeps getting confused about directions to getting places.  She gets frustrated and is hard on herself.   Pt gets frustrated and angry with herself at times.    Addressed how hard pt can be with herself.  Worked on Solicitor and compassion.   Encouraged pt to utilize reminder and organizational strategies to help her remember things.   ?Pt has been going to meet up groups and meeting new people.  She likes to go out dancing which she states is very therapeutic for her.   ?Pt likes organizing and rearranging things at her apartment. ?Pt states she is feeling more and more confident and is trying to change her negative thoughts.  She is working on a positive mindset.   ?Pt talked about relationships with men.  She is interested in a couple of guys but she does not want to make poor choices.   She states there are a lot of "players" out there.  Addressed how pt can deal with unwanted advances from men.   Pt would like to find  companionship with a nice respectful man.  ?She is going to social events to increase the potential of meeting someone.   Being more social also helps to elevate her mood.   ?Worked on self care strategies.   ?Provided supportive therapy.  ? ?Interventions: Cognitive Behavioral Therapy and Insight-Oriented ? ?Diagnosis: F43.23 ? ?Plan: See pt's Treatment Plan for depression and anxiety in Therapy Charts.  (Treatment Plan Target Date: 08/25/2022) ?Pt is progressing toward treatment goals.   ?Plan to continue to see pt every two weeks.   ? ?Graylin Sperling, LCSW ? ? ? ? ?

## 2022-01-06 ENCOUNTER — Telehealth: Payer: Self-pay | Admitting: Neurology

## 2022-01-06 ENCOUNTER — Ambulatory Visit (INDEPENDENT_AMBULATORY_CARE_PROVIDER_SITE_OTHER): Payer: Medicare Other | Admitting: Neurology

## 2022-01-06 ENCOUNTER — Encounter: Payer: Self-pay | Admitting: Neurology

## 2022-01-06 VITALS — BP 142/72 | HR 60 | Ht 62.0 in | Wt 144.5 lb

## 2022-01-06 DIAGNOSIS — G3184 Mild cognitive impairment, so stated: Secondary | ICD-10-CM

## 2022-01-06 DIAGNOSIS — R9082 White matter disease, unspecified: Secondary | ICD-10-CM

## 2022-01-06 DIAGNOSIS — S069X9S Unspecified intracranial injury with loss of consciousness of unspecified duration, sequela: Secondary | ICD-10-CM

## 2022-01-06 MED ORDER — DONEPEZIL HCL 10 MG PO TABS: 10.0000 mg | ORAL_TABLET | Freq: Every day | ORAL | 3 refills | Status: DC

## 2022-01-06 NOTE — Patient Instructions (Signed)
Referral for neuro psych evaluation  ?Continue Aricept ?See you back in 6 months  ?

## 2022-01-06 NOTE — Telephone Encounter (Signed)
Pt has asked that RN be aware that she reached out to Dr Gerarda Fraction office and the results from the psych testing from 2 years ago will be mailed to her.  Once she gets it, pt plans on bringing the results to the office for Sarah, NP.  This is FYI no call back requested ?

## 2022-01-06 NOTE — Progress Notes (Signed)
? ? ?PATIENT: Abigail Michael ?DOB: 07-13-1951 ? ?REASON FOR VISIT: follow up for memory  ?HISTORY FROM: patient ?PRIMARY NEUROLOGIST: Dr. Krista Blue  ? ?HISTORY OF PRESENT ILLNESS: ?Today 01/06/22 ?Abigail Michael here today for follow-up. MMSE 29/30. On Aricept in AM, no longer vivid dreams. Frustrated still from the fall back in 2015. Got her own apartment, loves this. Her kids are close by. Taking dancing lessons, hard for her to follow instructions, doesn't retain information well. Gets embarrassed, tells people she has brain injury. She drives, focuses closely. Manages her own affairs. Is seeing therapist, gets a long great with her. In past did ST, which was helpful. Lately has been going to the doctor on the wrong days. Has to make to do list in order of what needs to be done, relies on this. Hard time remembering how to do things, even work her cell phone. Going to have Insurance underwriter. Is active in a friends a group. ? ?Update 01/06/2021 SS: Abigail Michael is a 71 year old female with history of memory disturbance.  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  She is on low-dose aspirin.  Has had work-up for MS, felt not the case.  Claims her memory issues started from a fall in 2015.  Is on Aricept, tolerating higher dose, often forgets to take it, does have vivid dreams. MMSE 26/30. Feels the Aricept helps her focus better. When driving, never listens to music, focuses only on driving. Can lose train of thought if interrupted. Is going to CST (craniosacral therapy), doing much better with this, her speed with processing and activities is better. Manges her own ADLS, finances. She likes to dance, line dancing, signed up for some new dance classes, can be harder to remember the steps. No major health issues. Here today alone. Completed PT for balance, improvement, did speech therapy, helped for word finding. She keeps comparing herself to how she was pre-2015, gets emotional. She is planning on getting  her own apartment, needs this for her confidence, living with her daughter now.  ? ?HISTORY ?06/30/2020 Dr. Jannifer Franklin: Abigail Michael is a 71 year old right-handed white female with a history of a concussion from a fall in 2015, she claims that her cognitive status has not been the same since that time.  She believes that her memory has gradually worsened over time.  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  She has had a carotid Doppler study and 2D echocardiogram done in 2018 before moving to this area from [place].  The studies were unremarkable.  The patient is on low-dose aspirin.  She continues to function independently, she drives a car and uses GPS to get around, she is able to pay her bills and keep up with medications and appointments.  She is sleeping well at night and has a good energy level during the day.  She denies any severe gait instability problems, she sometimes feels as if she is drifting to the left and she occasionally may catch her right toe with walking.  She is no longer on Ampyra, this made no difference in her walking when the medication was discontinued.  She returns to this office for an evaluation.  ? ?REVIEW OF SYSTEMS: Out of a complete 14 system review of symptoms, the patient complains only of the following symptoms, and all other reviewed  systems are negative. ? ?See HPI ? ?ALLERGIES: ?Allergies  ?Allergen Reactions  ? Bee Venom Anaphylaxis  ?  Other reaction(s): Anaphylaxis  ? Amoxicillin Dermatitis  ?  Yeast infection ?Other reaction(s): Dermatitis/Yeast Infection  ? ? ?HOME MEDICATIONS: ?Outpatient Medications Prior to Visit  ?Medication Sig Dispense Refill  ? amLODipine (NORVASC) 10 MG tablet Take 1 tablet (10 mg total) by mouth daily. 90 tablet 1  ? aspirin EC 81 MG tablet Take 81 mg by mouth daily.     ? atorvastatin (LIPITOR) 40 MG tablet  Take 1 tablet (40 mg total) by mouth daily. 90 tablet 3  ? buPROPion (WELLBUTRIN SR) 200 MG 12 hr tablet Take 1 tablet (200 mg total) by mouth 2 (two) times daily. 180 tablet 1  ? Cholecalciferol (VITAMIN D) 50 MCG (2000 UT) tablet     ? donepezil (ARICEPT) 10 MG tablet Take 1 tablet (10 mg total) by mouth daily. 90 tablet 3  ? EPINEPHrine 0.3 mg/0.3 mL IJ SOAJ injection Inject 0.3 mLs (0.3 mg total) into the skin as needed. 1 each 1  ? esomeprazole (NEXIUM) 40 MG capsule TAKE ONE CAPSULE BY MOUTH ONE TIME DAILY AT NOON 90 capsule 1  ? famotidine (PEPCID) 20 MG tablet Take 1 tablet (20 mg total) by mouth 2 (two) times daily. 180 tablet 1  ? FLUoxetine (PROZAC) 40 MG capsule Take 1 capsule (40 mg total) by mouth daily. 90 capsule 1  ? levothyroxine (SYNTHROID) 25 MCG tablet Take 1 tablet (25 mcg total) by mouth daily before breakfast. On an empty stomach 90 tablet 3  ? mirabegron ER (MYRBETRIQ) 50 MG TB24 tablet Take 1 tablet (50 mg total) by mouth daily. 90 tablet 3  ? Omega 3 1200 MG CAPS     ? OVER THE COUNTER MEDICATION Neuro-Alpha 2 caps QD    ? TURMERIC PO Take by mouth.     ? ?No facility-administered medications prior to visit.  ? ? ?PAST MEDICAL HISTORY: ?Past Medical History:  ?Diagnosis Date  ? Acid reflux   ? Brain injury 2015  ? Due to a fall   ? 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 prior records  ? Chicken pox   ? Constipation   ? Depression   ? Frequent headaches   ? GERD (gastroesophageal reflux disease)   ? H/O TB skin testing   ? Positive  ? Hyperlipidemia   ? Hypertension   ? MS (multiple sclerosis) ([place]) 12/15/2016  ? possible dx. many MRI w/ positive white matter abnormality. MRI reported stable since 2015. mild progressive  Cognitive loss  ? Obstructive sleep apnea   ? on prior neuro notes 10/2016; no results.   ? Osteoporosis   ? had been on reclast  ? Post concussion syndrome 10/2013  ? ongoing issues with concentration, memory loss, lability; EEG completed and normal.    ? TBI (traumatic brain injury) 2014  ? fall at [facility], hit head off of floor. No bleed, "concussion". Seen neurology since for focus and word finding.   ? TIA (transient ischemic attack) 11/30/2016  ? MRI normal. No lateralization. Speech affected and weakness during a hypotensive episdoe.   ? Urinary incontinence   ? ? ?PAST SURGICAL HISTORY: ?Past Surgical History:  ?Procedure Laterality Date  ? BACK SURGERY  02/2015  ? herniated disc L4/5/6  ? BREAST BIOPSY  1988  ? biopsy  ? COLONOSCOPY    ? [place] Around 2015  ? ESOPHAGOGASTRODUODENOSCOPY  2016  ?  [place]  ? LAPAROSCOPIC HYSTERECTOMY    ? partial   ? SHOULDER ARTHROSCOPY W/ LABRAL REPAIR Left 02/13/2016  ? left shoulder  ? Study: cognitive eval  06/23/2016  ? mild cog-linguistic impairment, difficulty with verbal fluency  ? Study: EEG   04/12/2015  ? Studies: EEG completed and normal.   ? Study: MRI  04/28/2016  ? Impression: numerous non-enhancing flair hyperintense lesions w/in the brain .Suspicious for demyelinating disease such as lyme or MS. Her Lyme test were negative.   ? ? ?FAMILY HISTORY: ?Family History  ?Problem Relation Age of Onset  ? Lung cancer Mother   ? Alcohol abuse Father   ? Throat cancer Father   ? Lung cancer Father   ? Depression Father   ? Heart attack Father   ? Breast cancer Sister   ? Alcohol abuse Brother   ? Diabetes Brother   ? Mental illness Sister   ? Stroke Daughter   ? Endometriosis Daughter   ? Heart disease Son   ? Breast cancer Maternal Aunt   ? Diabetes Maternal Grandmother   ? GER disease Daughter   ? Esophageal cancer Paternal Grandfather   ? Colon cancer Neg Hx   ? Rectal cancer Neg Hx   ? Stomach cancer Neg Hx   ? ? ?SOCIAL HISTORY: ?Social History  ? ?Socioeconomic History  ? Marital status: Single  ?  Spouse name: Not on file  ? Number of children: 4  ? Years of education: two years of college  ? Highest education level: GED or equivalent  ?Occupational History  ? Occupation: retired  ?Tobacco Use  ?  Smoking status: Former  ?  Packs/day: 1.00  ?  Years: 19.00  ?  Pack years: 19.00  ?  Types: Cigarettes  ?  Quit date: 2012  ?  Years since quitting: 11.2  ? Smokeless tobacco: Never  ? Tobacco comm

## 2022-01-06 NOTE — Telephone Encounter (Signed)
Noted, thank you

## 2022-01-07 ENCOUNTER — Telehealth: Payer: Self-pay | Admitting: Neurology

## 2022-01-07 NOTE — Telephone Encounter (Signed)
Referral sent to [facility] [phone]. ?

## 2022-01-14 ENCOUNTER — Ambulatory Visit (INDEPENDENT_AMBULATORY_CARE_PROVIDER_SITE_OTHER): Payer: Medicare Other | Admitting: Psychology

## 2022-01-14 DIAGNOSIS — F4323 Adjustment disorder with mixed anxiety and depressed mood: Secondary | ICD-10-CM

## 2022-01-14 NOTE — Telephone Encounter (Signed)
Patient is scheduled at [facility]: ? ?Intake/interview : 04/17/2022 ?Testing: 04/17/2022 ?Feedback: 05/13/2022.  ?

## 2022-01-14 NOTE — Progress Notes (Signed)
[facility] Counselor/Therapist Progress Note ? ?Patient ID: Abigail Michael, MRN: [id number],   ? ?Date: 01/14/2022 ? ?Time Spent: 2:00pm-2:55pm   55 minutes  ? ?Treatment Type: Individual Therapy ? ?Reported Symptoms: stress ? ?Mental Status Exam: ?Appearance:  Well Groomed     ?Behavior: Appropriate  ?Motor: Normal  ?Speech/Language:  Normal Rate  ?Affect: Appropriate  ?Mood: normal  ?Thought process: normal  ?Thought content:   WNL  ?Sensory/Perceptual disturbances:   WNL  ?Orientation: oriented to person, place, time/date, and situation  ?Attention: Good  ?Concentration: Good  ?Memory: WNL  ?Fund of knowledge:  Good  ?Insight:   Good  ?Judgment:  Good  ?Impulse Control: Good  ? ?Risk Assessment: ?Danger to Self:  No ?Self-injurious Behavior: No ?Danger to Others: No ?Duty to Warn:no ?Physical Aggression / Violence:No  ?Access to Firearms a concern: No  ?Gang Involvement:No  ? ?Subjective: Pt [name] present for face-to-face individual therapy in person. ?Pt talked about her health.  She had a doctor appointment for her eyes and upcoming cataract surgery.  Pt's daughter went to the appointment with her to help her with all of the information.  Pt will have the surgeries May 22nd and June 5th.   ?Pt talked about feeling stressed about life bc she wants to be prepared for everything.  Pt is still struggling with directions. ?Pt is trying to work on improving her confidence.   ?Pt talked about going out with her friends.  She is working on staying more social. ?Pt's brother is coming to visit her the end of April so pt needs to prepare by buying bedroom furniture.  This feels overwhelming for pt.  Helped her problem solve and worked on Child psychotherapist.   ?She is going to social events to increase the potential of meeting someone.   Being more social also helps to elevate her mood.   ?Pt is going to get more neuropsychological testing since she is still forgetting things.   ?Worked on self care  strategies.   ?Provided supportive therapy.  ? ?Interventions: Cognitive Behavioral Therapy and Insight-Oriented ? ?Diagnosis: F43.23 ? ?Plan: See pt's Treatment Plan for depression and anxiety in Therapy Charts.  (Treatment Plan Target Date: 08/25/2022) ?Pt is progressing toward treatment goals.   ?Plan to continue to see pt every two weeks.   ? ?Shandrea Lusk, LCSW ? ? ? ?

## 2022-01-20 NOTE — Progress Notes (Signed)
Chart reviewed, agree above plan ?

## 2022-02-05 ENCOUNTER — Ambulatory Visit (INDEPENDENT_AMBULATORY_CARE_PROVIDER_SITE_OTHER): Payer: Medicare Other | Admitting: Psychology

## 2022-02-05 DIAGNOSIS — F4323 Adjustment disorder with mixed anxiety and depressed mood: Secondary | ICD-10-CM | POA: Diagnosis not present

## 2022-02-05 NOTE — Progress Notes (Signed)
[facility] Counselor/Therapist Progress Note ? ?Patient ID: Abigail Michael, MRN: [id number],   ? ?Date: 02/05/2022 ? ?Time Spent: 10:00am-10:50am   50 minutes  ? ?Treatment Type: Individual Therapy ? ?Reported Symptoms: stress ? ?Mental Status Exam: ?Appearance:  Well Groomed     ?Behavior: Appropriate  ?Motor: Normal  ?Speech/Language:  Normal Rate  ?Affect: Appropriate  ?Mood: normal  ?Thought process: normal  ?Thought content:   WNL  ?Sensory/Perceptual disturbances:   WNL  ?Orientation: oriented to person, place, time/date, and situation  ?Attention: Good  ?Concentration: Good  ?Memory: WNL  ?Fund of knowledge:  Good  ?Insight:   Good  ?Judgment:  Good  ?Impulse Control: Good  ? ?Risk Assessment: ?Danger to Self:  No ?Self-injurious Behavior: No ?Danger to Others: No ?Duty to Warn:no ?Physical Aggression / Violence:No  ?Access to Firearms a concern: No  ?Gang Involvement:No  ? ?Subjective:  ?Pt [name] present for face-to-face individual therapy via video Webex.  Pt consents to telehealth video session due to COVID 19 pandemic. ?Location of pt: home ?Location of therapist: home office.  ?Pt talked about feeling stressed.   Her brother is coming to visit the end of April and pt is working to prepare for the visit.  One of the stressors is that both of her daughters want to see him but pt's daughters do not get along and don't want to be together.    Pt has to orchestrate the visits separately which is stressful.   Helped pt process her feelings and relationship dynamics.   ?Pt talked about conflict with a new friend.  The friend stood her up for dinner plans and pt is upset about it.  Addressed how pt can communicate with the friend to resolve things.  ?Worked on self care strategies.   ?Provided supportive therapy.  ? ?Interventions: Cognitive Behavioral Therapy and Insight-Oriented ? ?Diagnosis: F43.23 ? ?Plan: See pt's Treatment Plan for depression and anxiety in Therapy Charts.  (Treatment Plan  Target Date: 08/25/2022) ?Pt is progressing toward treatment goals.   ?Plan to continue to see pt every two weeks.   ? ?Eulalah Rupert, LCSW ? ? ? ?

## 2022-02-13 IMAGING — MG DIGITAL SCREENING BILAT W/ TOMO W/ CAD
8 series · 8 of 24 positions shown · non-contrast
Comparison: Previous exam(s).

CLINICAL DATA: Screening.

EXAM:
DIGITAL SCREENING BILATERAL MAMMOGRAM WITH TOMO AND CAD

[R MLO synth-2D]
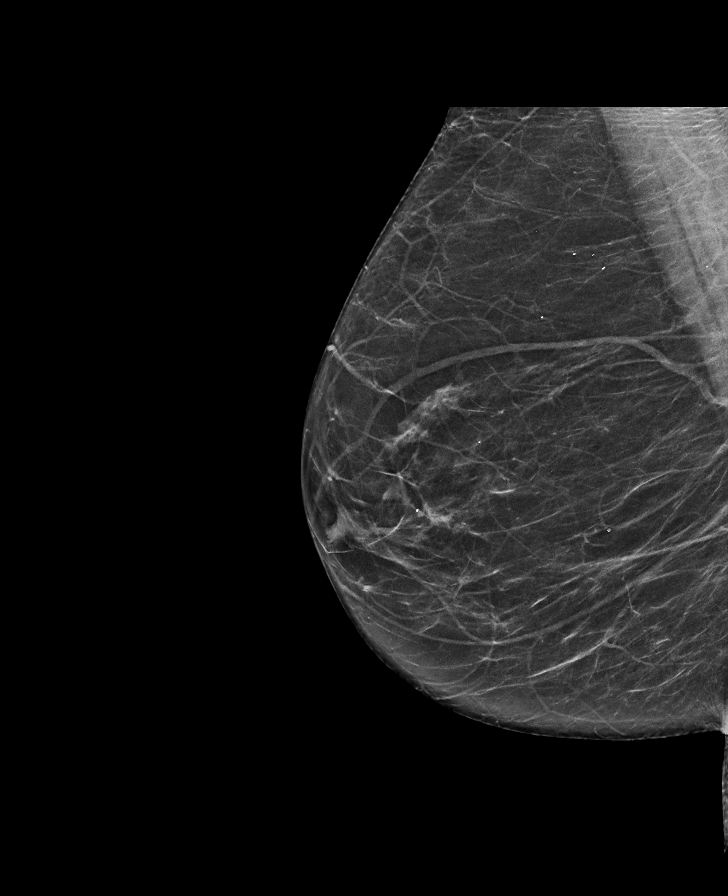

[R CC synth-2D]
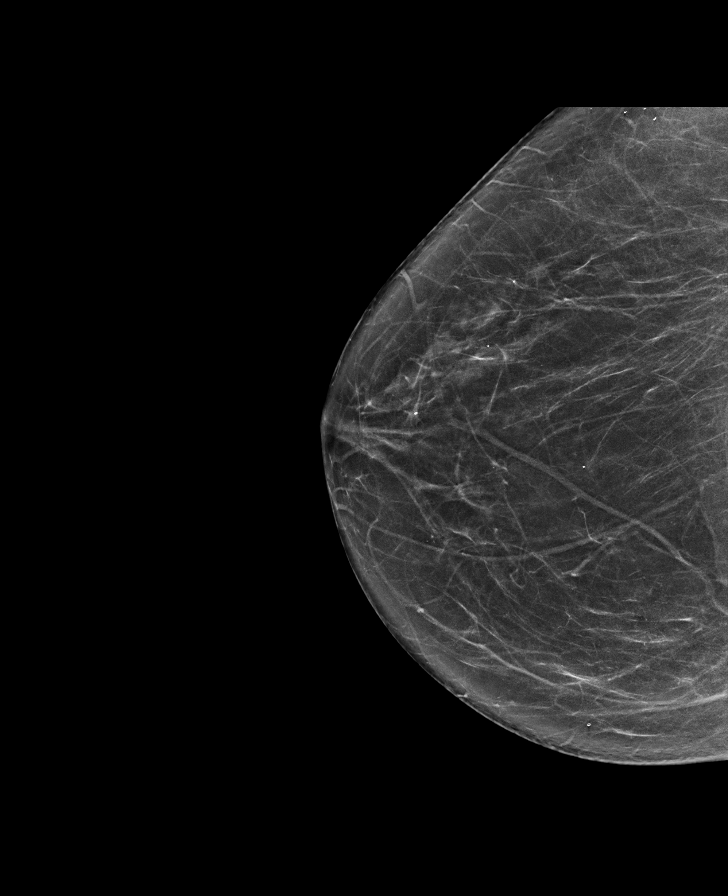

[L CC synth-2D]
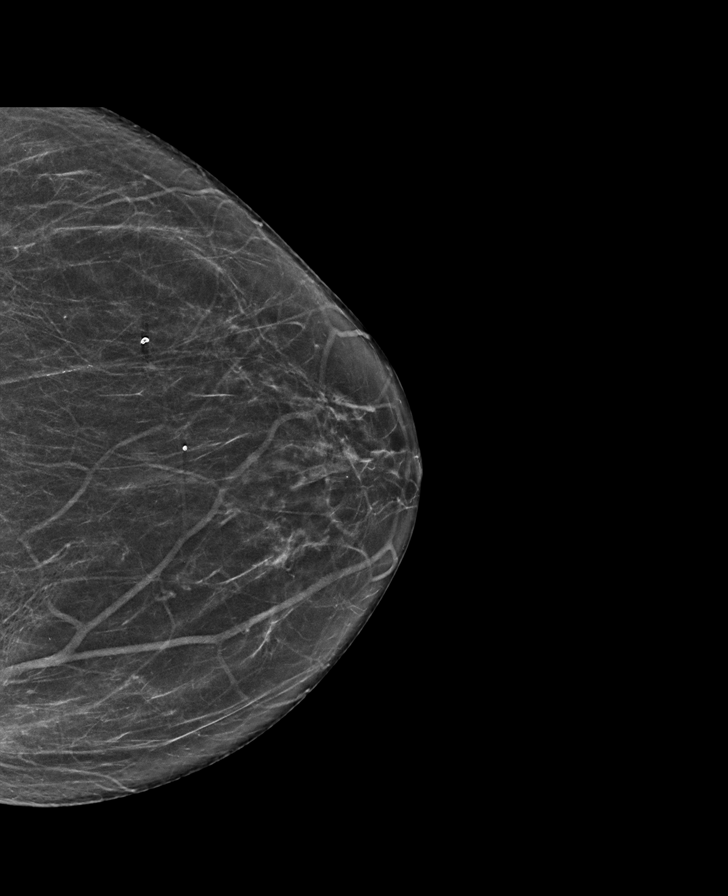

[L MLO synth-2D]
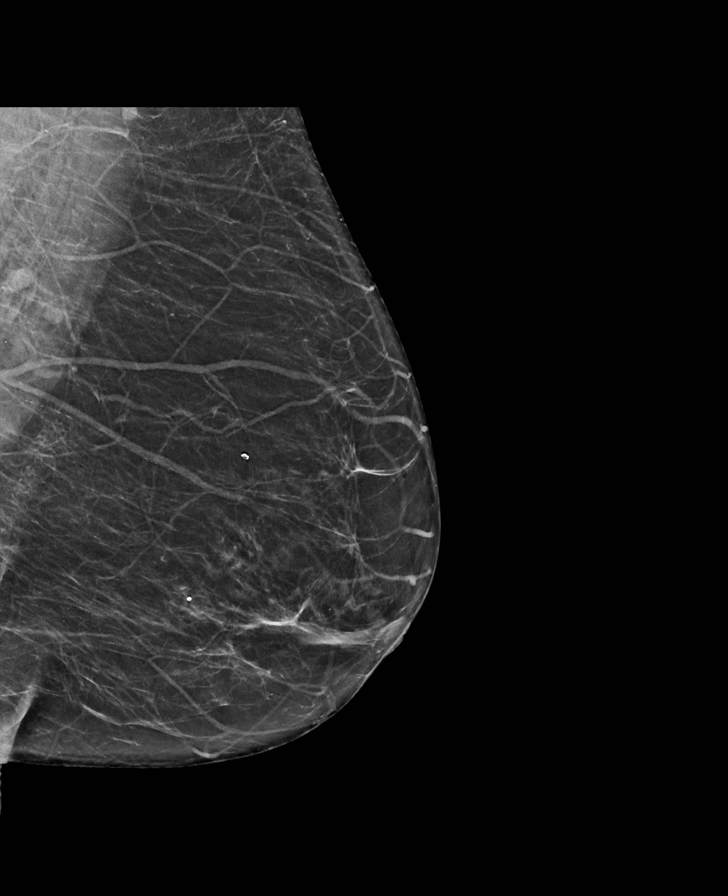

[L MLO tomo · tomo slice 35/70.0]
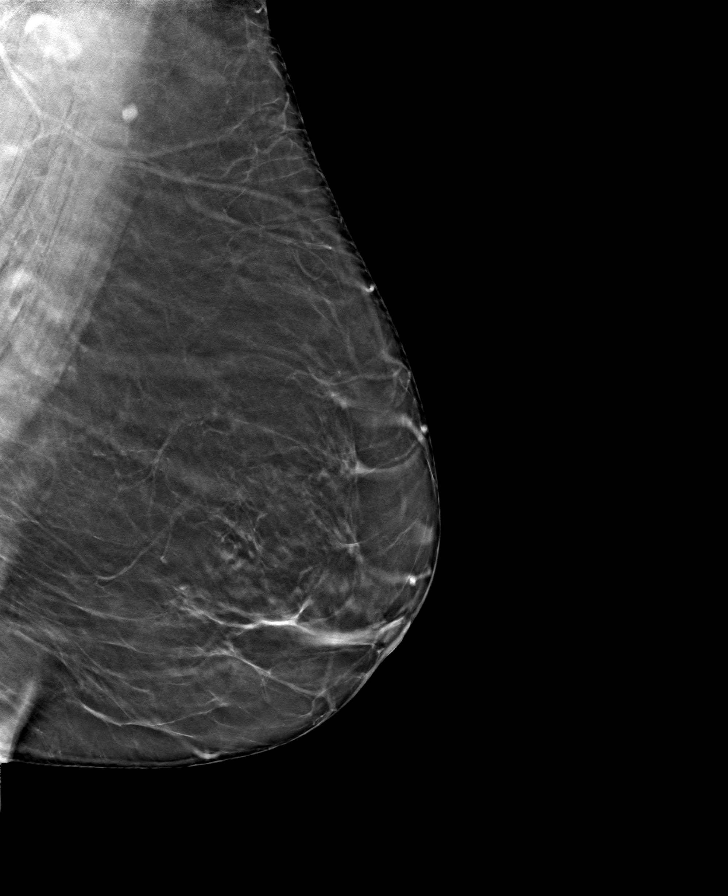

[L CC tomo · tomo slice 33/66.0]
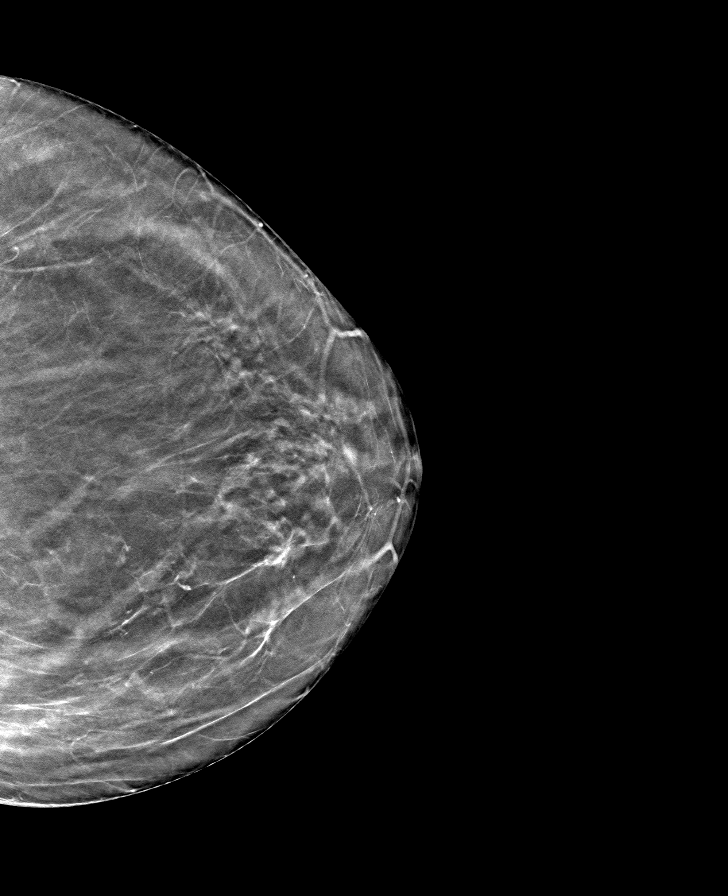

[R CC tomo · tomo slice 36/71.0]
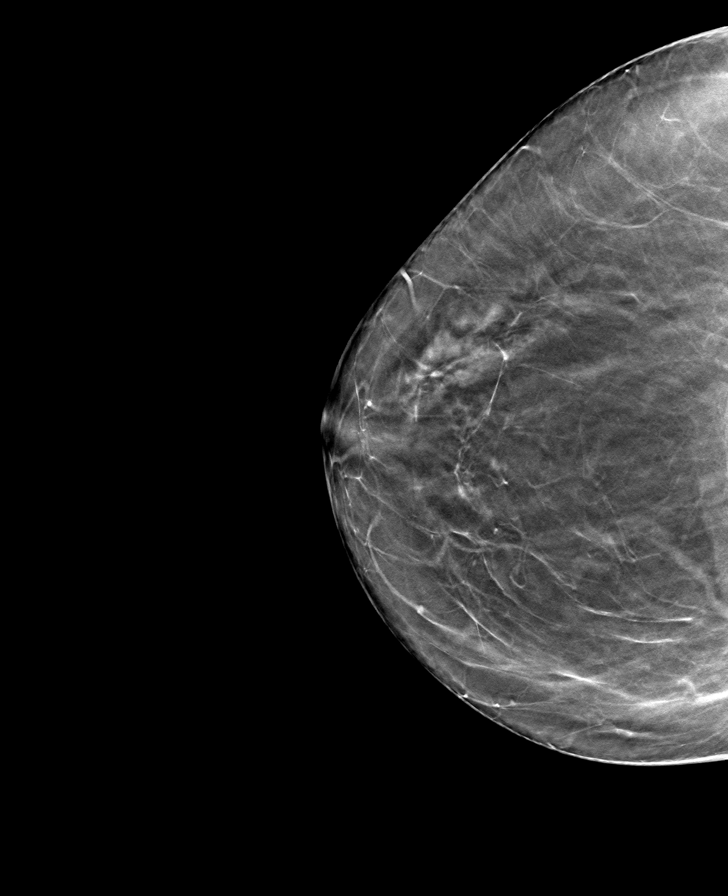

[R MLO tomo · tomo slice 36/71.0]
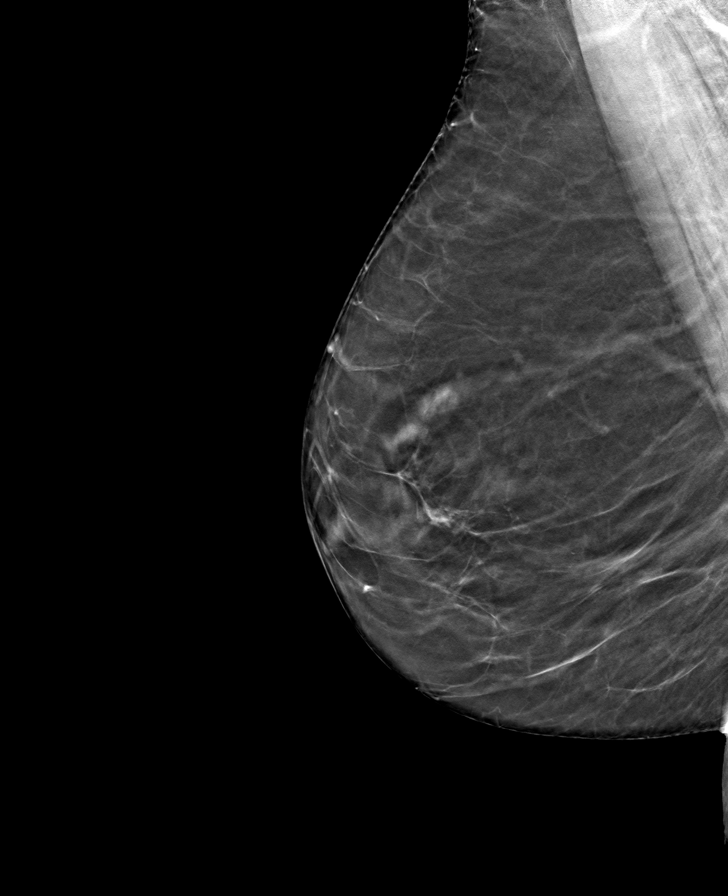

[8 of 24 positions shown; findings below may reference images not displayed]

ACR Breast Density Category b: There are scattered areas of
fibroglandular density.
FINDINGS: There are no findings suspicious for malignancy. Images were
processed with CAD.
IMPRESSION: No mammographic evidence of malignancy. A result letter of this
screening mammogram will be mailed directly to the patient.

RECOMMENDATION:
Screening mammogram in one year. (Code:[id number])

BI-RADS CATEGORY  1: Negative.

## 2022-03-04 ENCOUNTER — Ambulatory Visit (INDEPENDENT_AMBULATORY_CARE_PROVIDER_SITE_OTHER): Payer: Medicare Other | Admitting: Psychology

## 2022-03-04 DIAGNOSIS — F4323 Adjustment disorder with mixed anxiety and depressed mood: Secondary | ICD-10-CM | POA: Diagnosis not present

## 2022-03-04 NOTE — Progress Notes (Signed)
[facility] Counselor/Therapist Progress Note  Patient ID: Abigail Michael, MRN: [id number],    Date: 03/04/2022  Time Spent: 9:00am-9:50am   50 minutes   Treatment Type: Individual Therapy  Reported Symptoms: stress  Mental Status Exam: Appearance:  Well Groomed     Behavior: Appropriate  Motor: Normal  Speech/Language:  Normal Rate  Affect: Appropriate  Mood: normal  Thought process: normal  Thought content:   WNL  Sensory/Perceptual disturbances:   WNL  Orientation: oriented to person, place, time/date, and situation  Attention: Good  Concentration: Good  Memory: WNL  Fund of knowledge:  Good  Insight:   Good  Judgment:  Good  Impulse Control: Good   Risk Assessment: Danger to Self:  No Self-injurious Behavior: No Danger to Others: No Duty to Warn:no Physical Aggression / Violence:No  Access to Firearms a concern: No  Gang Involvement:No   Subjective:  Pt Abigail Michael present for face-to-face individual therapy via video Webex.  Pt consents to telehealth video session due to COVID 19 pandemic. Location of pt: home Location of therapist: home office.  Pt states she has been very busy and "has been trying not to get stressed".   Pt has her catarac surgery on Monday and feels some stress about that.  Addressed pt's stress and anxiety about her upcoming surgery. Pt talked about her daughters' relationship.  They have not gotten along and often so not like being in the same space.  Her two daughters were together at pt's grandson's graduation.  They managed to get along for that event.  This meant a lot to pt.   Pt is planning a couple of trips.  She is going to [place] in June.  Pt's daughter helped her make flight plans.  Pt has a good support system.   Pt is working on speaking up and saying what she thinks and feels.  She has tended to keep things to herself to try to please everyone else.  Worked on ways to effectively communicate her thoughts and  feelings.   Pt took [employer] and she had trouble remembering the instructions when she was there and she felt upset.   She explained to the instructor about her brain injury and she gave pt some extra instruction and attention which helped.  Addressed how pt can give herself grace and compassion instead of being critical of herself.   Pt has started exercising.  She bought a new sewing machine and has started to sew again.  Worked on self care strategies.   Provided supportive therapy.   Interventions: Cognitive Behavioral Therapy and Insight-Oriented  Diagnosis: F43.23  Plan: See pt's Treatment Plan for depression and anxiety in Therapy Charts.  (Treatment Plan Target Date: 08/25/2022) Pt is progressing toward treatment goals.   Plan to continue to see pt every two weeks.    Abigail Ruhe, LCSW

## 2022-03-08 DIAGNOSIS — H2511 Age-related nuclear cataract, right eye: Secondary | ICD-10-CM | POA: Diagnosis not present

## 2022-03-09 DIAGNOSIS — H2512 Age-related nuclear cataract, left eye: Secondary | ICD-10-CM | POA: Diagnosis not present

## 2022-03-22 DIAGNOSIS — H2512 Age-related nuclear cataract, left eye: Secondary | ICD-10-CM | POA: Diagnosis not present

## 2022-03-22 DIAGNOSIS — H52202 Unspecified astigmatism, left eye: Secondary | ICD-10-CM | POA: Diagnosis not present

## 2022-03-24 ENCOUNTER — Ambulatory Visit: Payer: Medicare Other | Admitting: Psychology

## 2022-04-26 DIAGNOSIS — R413 Other amnesia: Secondary | ICD-10-CM | POA: Diagnosis not present

## 2022-04-28 ENCOUNTER — Ambulatory Visit: Payer: Medicare Other | Admitting: Psychology

## 2022-04-28 ENCOUNTER — Ambulatory Visit (INDEPENDENT_AMBULATORY_CARE_PROVIDER_SITE_OTHER): Payer: Medicare Other | Admitting: Psychology

## 2022-04-28 DIAGNOSIS — F4323 Adjustment disorder with mixed anxiety and depressed mood: Secondary | ICD-10-CM | POA: Diagnosis not present

## 2022-04-28 NOTE — Progress Notes (Signed)
[facility] Counselor/Therapist Progress Note  Patient ID: Abigail Michael, MRN: [id number],    Date: 04/28/2022  Time Spent: 1:00pm-1:55pm    55 minutes   Treatment Type: Individual Therapy  Reported Symptoms: stress  Mental Status Exam: Appearance:  Well Groomed     Behavior: Appropriate  Motor: Normal  Speech/Language:  Normal Rate  Affect: Appropriate  Mood: normal  Thought process: normal  Thought content:   WNL  Sensory/Perceptual disturbances:   WNL  Orientation: oriented to person, place, time/date, and situation  Attention: Good  Concentration: Good  Memory: WNL  Fund of knowledge:  Good  Insight:   Good  Judgment:  Good  Impulse Control: Good   Risk Assessment: Danger to Self:  No Self-injurious Behavior: No Danger to Others: No Duty to Warn:no Physical Aggression / Violence:No  Access to Firearms a concern: No  Gang Involvement:No   Subjective:  Pt Abigail Michael present for face-to-face individual therapy via video Webex.  Pt consents to telehealth video session due to COVID 19 pandemic. Location of pt: home Location of therapist: home office.  Pt talked about her family.  She has a sister who lives in residential care due to mental health issues.  Addressed pt's worries about her sister.  Pt traveled up [place] to see family and had a good time but there were also some frustrating family dynamics.  Pt feels criticized by some family members for just being who she is and it hurts her feelings.   Pt states she has felt anxious.   She feels angry that she is not in a relationship and that she does not have a house.  She is feeling impatient about her life not being where she wants it to be.    Pt talked about being lonely.  She really wants a man in her life.   Pt talked about her daughters.   They are starting to get along now.   Pt took care of her niece and nephew and the niece is a very troubled person who has hurt pt a lot.  Pt has been realizing how  stress impacts her body.  She gets back pain at times.  Worked on Child psychotherapist.   Addressed how pt can give herself grace and compassion instead of being critical of herself.   Worked on self care strategies.   Provided supportive therapy.   Interventions: Cognitive Behavioral Therapy and Insight-Oriented  Diagnosis: F43.23  Plan: See pt's Treatment Plan for depression and anxiety in Therapy Charts.  (Treatment Plan Target Date: 08/25/2022) Pt is progressing toward treatment goals.   Plan to continue to see pt every two weeks.    Abigail Apple, LCSW

## 2022-05-04 ENCOUNTER — Telehealth: Payer: Self-pay | Admitting: Neurology

## 2022-05-04 NOTE — Telephone Encounter (Signed)
I received neuropsychological evaluation from April 26, 2022 with Dr. Eliezer Lofts Ren[place].  Neuropsychological testing was valid and interpretable and was largely within normal limits for her age and educational level.  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  There is absolutely no evidence of Alzheimer's disease or a neurodegenerative condition.  Not felt to be connected to the mild head injury she sustained in 2015.  No evidence of Alzheimer's disease or memory impairment on testing, discussed pros and cons of continuing Aricept.

## 2022-05-13 DIAGNOSIS — R413 Other amnesia: Secondary | ICD-10-CM | POA: Diagnosis not present

## 2022-05-14 ENCOUNTER — Telehealth: Payer: Self-pay | Admitting: Neurology

## 2022-05-14 NOTE — Telephone Encounter (Signed)
Pt states the Neuro Psychologist she has seen by the name of Abigail Michael has advised pt to talk to her provider about coming off of her donepezil (ARICEPT) 10 MG tablet, Pt is askig for a call to discuss.  Pt states she last took this on yesterday (Pt aware the office is not open today)

## 2022-05-17 NOTE — Telephone Encounter (Signed)
Request made today

## 2022-05-28 ENCOUNTER — Ambulatory Visit (INDEPENDENT_AMBULATORY_CARE_PROVIDER_SITE_OTHER): Payer: Medicare Other | Admitting: Psychology

## 2022-05-28 DIAGNOSIS — F4323 Adjustment disorder with mixed anxiety and depressed mood: Secondary | ICD-10-CM
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
## 2022-06-02 ENCOUNTER — Ambulatory Visit (INDEPENDENT_AMBULATORY_CARE_PROVIDER_SITE_OTHER): Payer: Medicare Other | Admitting: Family Medicine

## 2022-06-02 ENCOUNTER — Telehealth: Payer: Self-pay | Admitting: Family Medicine

## 2022-06-02 ENCOUNTER — Encounter: Payer: Self-pay | Admitting: Family Medicine

## 2022-06-02 VITALS — BP 116/72 | HR 61 | Temp 97.6°F | Ht 62.0 in | Wt 151.0 lb

## 2022-06-02 DIAGNOSIS — Z79899 Other long term (current) drug therapy: Secondary | ICD-10-CM

## 2022-06-02 DIAGNOSIS — S069X9S Unspecified intracranial injury with loss of consciousness of unspecified duration, sequela: Secondary | ICD-10-CM | POA: Diagnosis not present

## 2022-06-02 DIAGNOSIS — F419 Anxiety disorder, unspecified: Secondary | ICD-10-CM | POA: Diagnosis not present

## 2022-06-02 DIAGNOSIS — Z5181 Encounter for therapeutic drug level monitoring: Secondary | ICD-10-CM | POA: Diagnosis not present

## 2022-06-02 DIAGNOSIS — F330 Major depressive disorder, recurrent, mild: Secondary | ICD-10-CM

## 2022-06-02 DIAGNOSIS — M810 Age-related osteoporosis without current pathological fracture: Secondary | ICD-10-CM | POA: Diagnosis not present

## 2022-06-02 DIAGNOSIS — E782 Mixed hyperlipidemia: Secondary | ICD-10-CM

## 2022-06-02 DIAGNOSIS — I10 Essential (primary) hypertension: Secondary | ICD-10-CM | POA: Diagnosis not present

## 2022-06-02 DIAGNOSIS — E034 Atrophy of thyroid (acquired): Secondary | ICD-10-CM | POA: Diagnosis not present

## 2022-06-02 DIAGNOSIS — N1831 Chronic kidney disease, stage 3a: Secondary | ICD-10-CM

## 2022-06-02 DIAGNOSIS — N3281 Overactive bladder: Secondary | ICD-10-CM | POA: Diagnosis not present

## 2022-06-02 DIAGNOSIS — K219 Gastro-esophageal reflux disease without esophagitis: Secondary | ICD-10-CM | POA: Diagnosis not present

## 2022-06-02 DIAGNOSIS — G3281 Cerebellar ataxia in diseases classified elsewhere: Secondary | ICD-10-CM | POA: Diagnosis not present

## 2022-06-02 LAB — COMPREHENSIVE METABOLIC PANEL
ALT: 18 U/L (ref 0–35)
AST: 16 U/L (ref 0–37)
Albumin: 4.6 g/dL (ref 3.5–5.2)
Alkaline Phosphatase: 94 U/L (ref 39–117)
BUN: 16 mg/dL (ref 6–23)
CO2: 28 mEq/L (ref 19–32)
Calcium: 10.6 mg/dL — ABNORMAL HIGH (ref 8.4–10.5)
Chloride: 107 mEq/L (ref 96–112)
Creatinine, Ser: 0.9 mg/dL (ref 0.40–1.20)
GFR: 64.26 mL/min (ref >60.00)
Glucose, Bld: 85 mg/dL (ref 70–99)
Potassium: 4.3 mEq/L (ref 3.5–5.1)
Sodium: 139 mEq/L (ref 135–145)
Total Bilirubin: 0.4 mg/dL (ref 0.2–1.2)
Total Protein: 6.8 g/dL (ref 6.0–8.3)

## 2022-06-02 LAB — CBC
HCT: 43.5 % (ref 36.0–46.0)
Hemoglobin: 14.5 g/dL (ref 12.0–15.0)
MCHC: 33.3 g/dL (ref 30.0–36.0)
MCV: 92.2 fl (ref 78.0–100.0)
Platelets: 210 10*3/uL (ref 150.0–400.0)
RBC: 4.72 Mil/uL (ref 3.87–5.11)
RDW: 13.2 % (ref 11.5–15.5)
WBC: 6.1 10*3/uL (ref 4.0–10.5)

## 2022-06-02 LAB — VITAMIN D 25 HYDROXY (VIT D DEFICIENCY, FRACTURES): VITD: 71.9 ng/mL (ref 30.00–100.00)

## 2022-06-02 LAB — VITAMIN B12: Vitamin B-12: 336 pg/mL (ref 211–911)

## 2022-06-02 LAB — LIPID PANEL
Cholesterol: 202 mg/dL — ABNORMAL HIGH (ref 0–200)
HDL: 128.1 mg/dL (ref >39.00)
LDL Cholesterol: 57 mg/dL (ref 0–99)
NonHDL: 74.18
Total CHOL/HDL Ratio: 2
Triglycerides: 84 mg/dL (ref 0.0–149.0)
VLDL: 16.8 mg/dL (ref 0.0–40.0)

## 2022-06-02 LAB — MAGNESIUM: Magnesium: 2.1 mg/dL (ref 1.5–2.5)

## 2022-06-02 LAB — TSH: TSH: 3.71 u[IU]/mL (ref 0.35–5.50)

## 2022-06-02 MED ORDER — FAMOTIDINE 20 MG PO TABS: 20.0000 mg | ORAL_TABLET | Freq: Every day | ORAL | 1 refills | Status: DC

## 2022-06-02 MED ORDER — LEVOTHYROXINE SODIUM 25 MCG PO TABS
25.0000 ug | ORAL_TABLET | Freq: Every day | ORAL | 3 refills | Status: DC
Start: 2022-06-02 — End: 2022-11-16

## 2022-06-02 MED ORDER — ATORVASTATIN CALCIUM 40 MG PO TABS: 40.0000 mg | ORAL_TABLET | Freq: Every day | ORAL | 3 refills | Status: DC

## 2022-06-02 MED ORDER — BUPROPION HCL ER (SR) 200 MG PO TB12: 200.0000 mg | ORAL_TABLET | Freq: Two times a day (BID) | ORAL | 1 refills | Status: DC

## 2022-06-02 MED ORDER — FLUOXETINE HCL 40 MG PO CAPS: 40.0000 mg | ORAL_CAPSULE | Freq: Every day | ORAL | 1 refills | Status: DC

## 2022-06-02 MED ORDER — MIRABEGRON ER 50 MG PO TB24: 50.0000 mg | ORAL_TABLET | Freq: Every day | ORAL | 3 refills | Status: DC

## 2022-06-02 MED ORDER — AMLODIPINE BESYLATE 10 MG PO TABS: 10.0000 mg | ORAL_TABLET | Freq: Every day | ORAL | 1 refills | Status: DC

## 2022-06-02 MED ORDER — ESOMEPRAZOLE MAGNESIUM 40 MG PO CPDR: DELAYED_RELEASE_CAPSULE | ORAL | 1 refills | Status: DC

## 2022-06-02 NOTE — Patient Instructions (Signed)
No follow-ups on file.        Great to see you today.  I have refilled the medication(s) we provide.   If labs were collected, we will inform you of lab results once received either by echart message or telephone call.   - echart message- for normal results that have been seen by the patient already.   - telephone call: abnormal results or if patient has not viewed results in their echart.  

## 2022-06-02 NOTE — Telephone Encounter (Signed)
Please call patient: Liver, kidney and thyroid function are normal.  I have refilled her thyroid medication for her at the same dose. Her calcium is mildly elevated at 10.6, normal is 10.5.  Make sure to hydrate appropriately.  Adding more water will help dilute the calcium down into normal range. Blood cell counts are normal. Vitamin D levels are little above goal.  Her vitamin D is at 71.9.  I would encourage her to continue her daily dose but only take 6 days a week. B12 is low normal at 336.  She could use an increase in B12.  If she is not currently taking a B12 supplement I would encourage her to start B12 1000 mcg sublingual tablets or solution every other day.  B12 helps with memory, nerve health and energy.

## 2022-06-02 NOTE — Progress Notes (Signed)
Patient ID: Abigail Michael, female  DOB: [birth year], 71 y.o.   MRN: [id number] Patient Care Team    Relationship Specialty Notifications Start End  Ma Hillock, DO PCP - General Family Medicine  01/16/18   Sharlene Dory, MD Referring Physician Neurology  08/28/18   Tommi Rumps  Dentistry  07/17/19   [name]  Orthodontics  07/17/19   Feliberto Harts R  Oral Surgery  07/17/19   Delanna Ahmadi, MD Referring Physician Psychiatry  07/17/19    Comment: Neurology  Darleen Crocker, MD Consulting Physician Ophthalmology  12/16/21    Comment: cataract    Chief Complaint  Patient presents with   Depression   Hypertension    Pt is fasting    Subjective: Abigail Michael is a 71 y.o.  female present for follow up cmc Depression with anxiety Patient reports she is feeling great on Wellbutrin and prozac.  She had a therapist that she enjoyed seeing, however that therapist has since left that practice.  She is still in a group of online people within her age bracket they get together and go out to eat, go bowling etc. she now has her own apartment and is enjoying decorating it.  She reports she has found some new friends and they go out and dance a few times a week.    Essential hypertension/HLD/morbid obesity/h/o TIA: Pt reports compliance with amlodipine 10 mg QD.Blood pressures ranges at home are within normal range. Patient denies chest pain, shortness of breath, dizziness or lower extremity edema.  Pt takes a  daily baby ASA. Pt is  prescribed statin. H/O TIA.  Diet: Low sodium Exercise: Patient reports she has become more active getting out walking more routinely and feeling better. RF: HTN, HLD, Obesity, h/o CVA, FHX MI   GERD/PPI:  Patient reports compliance with Nexium and Pepcid.    Marland Kitchen   Hypothyroidism:  Patient reports compliance with 25 mcg levothyroxine daily.     Overactive bladder/MS-white matter disease/TIA/TBI/ataxia: Patient reports compliance with her Myrbetriq and it is  working well or her.  She had been prescribed Aricept 10 mg nightly, she has asked this to be removed after her chart today.     12/16/2021   10:33 AM 08/12/2021    9:23 AM 07/30/2021    8:56 AM 07/03/2021    1:17 PM 12/02/2020    3:29 PM  Depression screen PHQ 2/9  Decreased Interest 0 0 0 2 0  Down, Depressed, Hopeless 0 0 0 2 0  PHQ - 2 Score 0 0 0 4 0  Altered sleeping 0 0  0 0  Tired, decreased energy 0 0  0 0  Change in appetite 0 0  0 0  Feeling bad or failure about yourself  0 0  2 0  Trouble concentrating 0 0  0 0  Moving slowly or fidgety/restless 0 0  0 0  Suicidal thoughts 0 0  1 0  PHQ-9 Score 0 0  7 0  Difficult doing work/chores     Not difficult at all      12/16/2021   10:34 AM 07/03/2021    1:18 PM 03/05/2020   10:27 AM 01/23/2020    1:03 PM  GAD 7 : Generalized Anxiety Score  Nervous, Anxious, on Edge 0 2 0 3  Control/stop worrying 0 2 0 0  Worry too much - different things 0 2 0 1  Trouble relaxing 0 0 0 3  Restless 0 0 0 0  Easily annoyed or irritable 0 2 0 3  Afraid - awful might happen 0 2 0 0  Total GAD 7 Score 0 10 0 10  Anxiety Difficulty   Not difficult at all Not difficult at all          12/16/2021   10:26 AM 12/12/2021    9:15 AM 08/12/2021    9:04 AM 07/30/2021    8:56 AM 08/06/2020    9:06 AM  [place] in the past year? 0 1  0 0  Number falls in past yr: 0 1 0 0 0  Injury with Fall? 0 0 0 0 0  Risk for fall due to : No Fall Risks      Follow up Falls evaluation completed  Falls evaluation completed;Falls prevention discussed Falls evaluation completed Falls prevention discussed     Immunization History  Administered Date(s) Administered   Fluad Quad(high Dose 65+) 09/16/2020, 07/03/2021   Influenza, High Dose Seasonal PF 08/28/2018, 06/13/2019   PFIZER(Purple Top)SARS-COV-2 Vaccination 11/09/2019, 12/03/2019, 09/16/2020   Pneumococcal Conjugate-13 12/02/2020   Pneumococcal Polysaccharide-23 08/28/2018   Zoster Recombinat  (Shingrix) 05/10/2019, 09/10/2019    No results found.  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 many MRI w/ positive white matter abnormality. MRI reported stable since 2015. mild progressive  Cognitive loss   Obstructive sleep apnea    on prior neuro notes 10/2016; no results.    Osteoporosis    had been on reclast   Post concussion syndrome 10/2013   ongoing issues with concentration, memory loss, lability; EEG completed and normal.    TBI (traumatic brain injury) ([place]) 2014   fall at [facility], hit head off of floor. No bleed, "concussion". Seen neurology since for focus and word finding.    TIA (transient ischemic attack) 11/30/2016   MRI normal. No lateralization. Speech affected and weakness during a hypotensive episdoe.    Urinary incontinence    Allergies  Allergen Reactions   Bee Venom Anaphylaxis    Other reaction(s): Anaphylaxis   Amoxicillin Dermatitis    Yeast infection Other reaction(s): Dermatitis/Yeast Infection   Past Surgical History:  Procedure Laterality Date   BACK SURGERY  02/2015   herniated disc L4/5/6   BREAST BIOPSY  1988   biopsy   catract Bilateral 2023   COLONOSCOPY     [place] Around 2015   ESOPHAGOGASTRODUODENOSCOPY  2016   [place]   LAPAROSCOPIC HYSTERECTOMY     partial    SHOULDER ARTHROSCOPY W/ LABRAL REPAIR Left 02/13/2016   left shoulder   Study: cognitive eval  06/23/2016   mild cog-linguistic impairment, difficulty with verbal fluency   Study: EEG   04/12/2015   Studies: EEG completed and normal.    Study: MRI  04/28/2016   Impression: numerous  non-enhancing flair hyperintense lesions w/in the brain .Suspicious for demyelinating disease such as lyme or MS. Her Lyme test were negative.    Family History  Problem Relation Age of Onset   Lung cancer  Mother    Alcohol abuse Father    Throat cancer Father    Lung cancer Father    Depression Father    Heart attack Father    Breast cancer Sister    Alcohol abuse Brother    Diabetes Brother    Mental illness Sister    Stroke Daughter    Endometriosis Daughter    Heart disease Son    Breast cancer Maternal Aunt    Diabetes Maternal Grandmother    GER disease Daughter    Esophageal cancer Paternal Grandfather    Colon cancer Neg Hx    Rectal cancer Neg Hx    Stomach cancer Neg Hx    Social History   Socioeconomic History   Marital status: Single    Spouse name: Not on file   Number of children: 4   Years of education: two years of college   Highest education level: GED or equivalent  Occupational History   Occupation: retired  Tobacco Use   Smoking status: Former    Packs/day: 1.00    Years: 19.00    Total pack years: 19.00    Types: Cigarettes    Quit date: 2012    Years since quitting: 11.6   Smokeless tobacco: Never   Tobacco comments:    quit 6 years ago  Vaping Use   Vaping Use: Never used  Substance and Sexual Activity   Alcohol use: Yes    Alcohol/week: 1.0 standard drink of alcohol    Types: 1 Glasses of wine per week    Comment: twice a month   Drug use: Never   Sexual activity: Not Currently    Partners: Male  Other Topics Concern   Not on file  Social History Narrative   Single. Lived in [place] area, moved to [place] to be close to her daughters 2018. Lives with her daughter.   Some college, worked as Programmer, systems. Retired.    Social drinker. Former smoker.   Exercises routinely.    Wears dentures.    Drinks caffeine, takes a daily vitamin.   Smoke alarm in the home. Wears her seatbelt.    Feels safe in her relationships.   3-4 cups per day.    Four children (2 biological, 2 she raised as her own).   Social Determinants of Health   Financial Resource Strain: Low Risk  (12/12/2021)   Overall Financial Resource Strain (CARDIA)    Difficulty of Paying Living Expenses: Not hard at all  Food Insecurity: Unknown (12/12/2021)   Hunger Vital Sign    Worried About Running Out of Food in the Last Year: Patient refused    [place] in the Last Year: Patient refused  Transportation Needs: No Transportation Needs (12/12/2021)   PRAPARE - Hydrologist (Medical): No    Lack of Transportation (Non-Medical): No  Physical Activity: Insufficiently Active (12/12/2021)   Exercise Vital Sign    Days of Exercise per Week: 1 day    Minutes of Exercise per Session: 50 min  Stress: Unknown (12/12/2021)   [place]    Feeling of Stress : Patient refused  Social Connections: Unknown (12/12/2021)   Social Connection and Isolation Panel [NHANES]    Frequency of Communication with Friends and Family: More than three times a week    Frequency of Social Gatherings with Friends and Family: Once a week    Attends Religious Services: Patient refused  Active Member of Clubs or Organizations: Yes    Attends Archivist Meetings: More than 4 times per year    Marital Status: Patient refused  Intimate Partner Violence: Not At Risk (08/12/2021)   Humiliation, Afraid, Rape, and Kick questionnaire    Fear of Current or Ex-Partner: No    Emotionally Abused: No    Physically Abused: No    Sexually Abused: No   Allergies as of 06/02/2022       Reactions   Bee Venom Anaphylaxis   Other reaction(s): Anaphylaxis   Amoxicillin Dermatitis   Yeast infection Other reaction(s): Dermatitis/Yeast Infection        Medication List        Accurate as of June 02, 2022 10:06 AM. If you have any questions, ask your nurse or doctor.          STOP taking  these medications    donepezil 10 MG tablet Commonly known as: ARICEPT Stopped by: Howard Pouch, DO   EPINEPHrine 0.3 mg/0.3 mL Soaj injection Commonly known as: EPI-PEN Stopped by: Howard Pouch, DO       TAKE these medications    amLODipine 10 MG tablet Commonly known as: NORVASC Take 1 tablet (10 mg total) by mouth daily.   aspirin EC 81 MG tablet Take 81 mg by mouth daily.   atorvastatin 40 MG tablet Commonly known as: LIPITOR Take 1 tablet (40 mg total) by mouth daily.   buPROPion 200 MG 12 hr tablet Commonly known as: Wellbutrin SR Take 1 tablet (200 mg total) by mouth 2 (two) times daily.   esomeprazole 40 MG capsule Commonly known as: NEXIUM TAKE ONE CAPSULE BY MOUTH ONE TIME DAILY AT NOON   famotidine 20 MG tablet Commonly known as: PEPCID Take 1 tablet (20 mg total) by mouth daily.   FLUoxetine 40 MG capsule Commonly known as: PROZAC Take 1 capsule (40 mg total) by mouth daily.   levothyroxine 25 MCG tablet Commonly known as: SYNTHROID Take 1 tablet (25 mcg total) by mouth daily before breakfast. On an empty stomach   mirabegron ER 50 MG Tb24 tablet Commonly known as: Myrbetriq Take 1 tablet (50 mg total) by mouth daily.   Omega 3 1200 MG Caps   OVER THE COUNTER MEDICATION Neuro-Alpha 2 caps QD   TURMERIC PO Take by mouth.   Vitamin D 50 MCG (2000 UT) tablet        All past medical history, surgical history, allergies, family history, immunizations andmedications were updated in the EMR today and reviewed under the history and medication portions of their EMR.     No results found for this or any previous visit (from the past 2160 hour(s)).   Patient was never admitted.   ROS: 14 pt review of systems performed and negative (unless mentioned in an HPI)  Objective: BP 116/72   Pulse 61   Temp 97.6 F (36.4 C) (Oral)   Ht '5\' 2"'  (1.575 m)   Wt 151 lb (68.5 kg)   LMP 10/18/1990   SpO2 97%   BMI 27.62 kg/m  Physical Exam Vitals  and nursing note reviewed.  Constitutional:      General: She is not in acute distress.    Appearance: Normal appearance. She is not ill-appearing, toxic-appearing or diaphoretic.  HENT:     Head: Normocephalic and atraumatic.  Eyes:     General: No scleral icterus.       Right eye: No discharge.  Left eye: No discharge.     Extraocular Movements: Extraocular movements intact.     Conjunctiva/sclera: Conjunctivae normal.     Pupils: Pupils are equal, round, and reactive to light.  Cardiovascular:     Rate and Rhythm: Normal rate and regular rhythm.     Heart sounds: No murmur heard.    No gallop.  Pulmonary:     Effort: Pulmonary effort is normal. No respiratory distress.     Breath sounds: Normal breath sounds. No wheezing, rhonchi or rales.  Musculoskeletal:     Cervical back: Neck supple.     Right lower leg: No edema.     Left lower leg: No edema.  Lymphadenopathy:     Cervical: No cervical adenopathy.  Skin:    General: Skin is warm and dry.     Coloration: Skin is not jaundiced or pale.     Findings: No erythema or rash.  Neurological:     Mental Status: She is alert and oriented to person, place, and time. Mental status is at baseline.     Motor: No weakness.     Gait: Gait normal.  Psychiatric:        Mood and Affect: Mood normal.        Behavior: Behavior normal.        Thought Content: Thought content normal.        Judgment: Judgment normal.      Assessment/plan: Abigail Michael is a 71 y.o. female present for [facility] Depression with anxiety stable Continue fluoxetine 40 mg daily Continue Wellbutrin  200 mg twice a day - Referral to Myra Gianotti for therapy was placed > new referral placed today, this particular provider has since moved on and patient needs to establish as soon as possible with in-house provider. - Tried in the past: celexa and effexor.     Essential hypertension/morbid obesity/HLD Stable Continue amlodipine 10 mg daily to  [facility]. New e  atorvastatin 40 mg daily, refills called into [facility]. continue ASA CBC, CMP, lipid, TSH collected today   Traumatic brain injury with loss of consciousness, sequela (HCC)/White matter disease/TIA//ataxia Stable Aricept 10 mg qd> managed by neuro> stopped She worked with physical therapy and did really well with improving her gait. Patient is established with neurology. overactive bladder Stable Continue Myrbetriq 50 mg daily. >  Mail-in prescription Encounter for monitoring long-term proton pump inhibitor therapy/GERD Stable Continue omeprazole. Continue Pepcid. Vitamin D, B12 and magnesium collected today  Hypothyroidism due to acquired atrophy of thyroid Continue levo 25 mcg, refills will be provided and appropriate dose after results received. TSH collected today  Stage 3a chronic kidney disease (HCC) Stable Avoid nsaids when able.  Monitor yearly  Hypercalcemia Chronic.hydrate PTH and vitamin D collected today   Return in about 24 weeks (around 11/17/2022) for Routine chronic condition follow-up.   Orders Placed This Encounter  Procedures   CBC   Comp Met (CMET)   TSH   Lipid panel   PTH, intact (no Ca)   Vitamin D (25 hydroxy)   B12   Magnesium    Meds ordered this encounter  Medications   amLODipine (NORVASC) 10 MG tablet    Sig: Take 1 tablet (10 mg total) by mouth daily.    Dispense:  90 tablet    Refill:  1   buPROPion (WELLBUTRIN SR) 200 MG 12 hr tablet    Sig: Take 1 tablet (200 mg total) by mouth 2 (two) times daily.    Dispense:  180  tablet    Refill:  1   esomeprazole (NEXIUM) 40 MG capsule    Sig: TAKE ONE CAPSULE BY MOUTH ONE TIME DAILY AT NOON    Dispense:  90 capsule    Refill:  1   famotidine (PEPCID) 20 MG tablet    Sig: Take 1 tablet (20 mg total) by mouth daily.    Dispense:  90 tablet    Refill:  1   FLUoxetine (PROZAC) 40 MG capsule    Sig: Take 1 capsule (40 mg total) by mouth daily.    Dispense:  90 capsule     Refill:  1   atorvastatin (LIPITOR) 40 MG tablet    Sig: Take 1 tablet (40 mg total) by mouth daily.    Dispense:  90 tablet    Refill:  3   mirabegron ER (MYRBETRIQ) 50 MG TB24 tablet    Sig: Take 1 tablet (50 mg total) by mouth daily.    Dispense:  90 tablet    Refill:  3    Referral Orders  No referral(s) requested today      Note is dictated utilizing voice recognition software. Although note has been proof read prior to signing, occasional typographical errors still can be missed. If any questions arise, please do not hesitate to call for verification.  Electronically signed by: Howard Pouch, DO [facility]

## 2022-06-03 ENCOUNTER — Telehealth: Payer: Self-pay | Admitting: Family Medicine

## 2022-06-03 DIAGNOSIS — L819 Disorder of pigmentation, unspecified: Secondary | ICD-10-CM

## 2022-06-03 LAB — PARATHYROID HORMONE, INTACT (NO CA): PTH: 15 pg/mL — ABNORMAL LOW (ref 16–77)

## 2022-06-03 NOTE — Telephone Encounter (Signed)
Referral to dermatology placed for new hyperpigmented lesion under left axilla

## 2022-06-03 NOTE — Telephone Encounter (Signed)
Spoke with pt regarding labs and instructions.   

## 2022-07-21 ENCOUNTER — Ambulatory Visit: Payer: Medicare Other | Admitting: Neurology

## 2022-07-21 ENCOUNTER — Ambulatory Visit: Payer: Medicare Other | Admitting: Psychology

## 2022-08-04 ENCOUNTER — Ambulatory Visit: Payer: Medicare Other | Admitting: Neurology

## 2022-08-04 NOTE — Progress Notes (Deleted)
PATIENT: Abigail Michael DOB: 06/29/1951  REASON FOR VISIT: follow up for memory  HISTORY FROM: patient PRIMARY NEUROLOGIST: Dr. Krista Michael   HISTORY OF PRESENT ILLNESS: Today 08/04/22  I received neuropsychological evaluation from April 26, 2022 with Dr. Eliezer Lofts Michael.  Neuropsychological testing was valid and interpretable and was largely within normal limits for her age and educational level.  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  There is absolutely no evidence of Alzheimer's disease or a neurodegenerative condition.  Not felt to be connected to the mild head injury she sustained in 2015.   No evidence of Alzheimer's disease or memory impairment on testing, discussed pros and cons of continuing Aricept.    Update 01/06/22 SS: [name] here today for follow-up. MMSE 29/30. On Aricept in AM, no longer vivid dreams. Frustrated still from the fall back in 2015. Got her own apartment, loves this. Her kids are close by. Taking dancing lessons, hard for her to follow instructions, doesn't retain information well. Gets embarrassed, tells people she has brain injury. She drives, focuses closely. Manages her own affairs. Is seeing therapist, gets a long great with her. In past did ST, which was helpful. Lately has been going to the doctor on the wrong days. Has to make to do list in order of what needs to be done, relies on this. Hard time remembering how to do things, even work her cell phone. Going to have Insurance underwriter. Is active in a friends a group.  Update 01/06/2021 SS: [name] is a 71 year old female with history of memory disturbance.  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  She is on low-dose aspirin.  Has had work-up for MS, felt not the case.  Claims her memory issues started from a fall in 2015.  Is on Aricept, tolerating higher dose, often forgets to take it, does have vivid  dreams. MMSE 26/30. Feels the Aricept helps her focus better. When driving, never listens to music, focuses only on driving. Can lose train of thought if interrupted. Is going to CST (craniosacral therapy), doing much better with this, her speed with processing and activities is better. Manges her own ADLS, finances. She likes to dance, line dancing, signed up for some new dance classes, can be harder to remember the steps. No major health issues. Here today alone. Completed PT for balance, improvement, did speech therapy, helped for word finding. She keeps comparing herself to how she was pre-2015, gets emotional. She is planning on getting her own apartment, needs this for her confidence, living with her daughter now.   HISTORY 06/30/2020 Dr. Jannifer Michael: [name] is a 71 year old right-handed white female with a history of a concussion from a fall in 2015, she claims that her cognitive status has not been the same since that time.  She believes that her memory has gradually worsened over time.  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  She has had a carotid Doppler study and 2D echocardiogram done in 2018 before moving to this area from [place].  The studies were unremarkable.  The patient is on low-dose aspirin.  She continues to function independently, she drives a car and uses GPS to get around, she is able to pay her bills and keep  up with medications and appointments.  She is sleeping well at night and has a good energy level during the day.  She denies any severe gait instability problems, she sometimes feels as if she is drifting to the left and she occasionally may catch her right toe with walking.  She is no longer on Ampyra, this made no difference in her walking when the medication was discontinued.  She returns to this office for an evaluation.   REVIEW OF  SYSTEMS: Out of a complete 14 system review of symptoms, the patient complains only of the following symptoms, and all other reviewed systems are negative.  See HPI  ALLERGIES: Allergies  Allergen Reactions   Bee Venom Anaphylaxis    Other reaction(s): Anaphylaxis   Amoxicillin Dermatitis    Yeast infection Other reaction(s): Dermatitis/Yeast Infection    HOME MEDICATIONS: Outpatient Medications Prior to Visit  Medication Sig Dispense Refill   amLODipine (NORVASC) 10 MG tablet Take 1 tablet (10 mg total) by mouth daily. 90 tablet 1   aspirin EC 81 MG tablet Take 81 mg by mouth daily.      atorvastatin (LIPITOR) 40 MG tablet Take 1 tablet (40 mg total) by mouth daily. 90 tablet 3   buPROPion (WELLBUTRIN SR) 200 MG 12 hr tablet Take 1 tablet (200 mg total) by mouth 2 (two) times daily. 180 tablet 1   Cholecalciferol (VITAMIN D) 50 MCG (2000 UT) tablet      esomeprazole (NEXIUM) 40 MG capsule TAKE ONE CAPSULE BY MOUTH ONE TIME DAILY AT NOON 90 capsule 1   famotidine (PEPCID) 20 MG tablet Take 1 tablet (20 mg total) by mouth daily. 90 tablet 1   FLUoxetine (PROZAC) 40 MG capsule Take 1 capsule (40 mg total) by mouth daily. 90 capsule 1   levothyroxine (SYNTHROID) 25 MCG tablet Take 1 tablet (25 mcg total) by mouth daily before breakfast. On an empty stomach 90 tablet 3   mirabegron ER (MYRBETRIQ) 50 MG TB24 tablet Take 1 tablet (50 mg total) by mouth daily. 90 tablet 3   Omega 3 1200 MG CAPS      OVER THE COUNTER MEDICATION Neuro-Alpha 2 caps QD     TURMERIC PO Take by mouth.      No facility-administered medications prior to visit.    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 many MRI w/ positive white matter abnormality. MRI reported stable since 2015. mild progressive  Cognitive loss   Obstructive sleep apnea    on prior neuro notes 10/2016; no results.    Osteoporosis    had been on reclast   Post concussion syndrome 10/2013   ongoing issues with concentration, memory loss, lability; EEG completed and normal.    TBI (traumatic brain injury) ([place]) 2014   fall at [facility], hit head off of floor. No bleed, "concussion". Seen neurology since for focus and word finding.    TIA (transient ischemic attack) 11/30/2016   MRI normal. No lateralization. Speech affected and  weakness during a hypotensive episdoe.    Urinary incontinence     PAST SURGICAL HISTORY: Past Surgical History:  Procedure Laterality Date   BACK SURGERY  02/2015   herniated disc L4/5/6   BREAST BIOPSY  1988   biopsy   catract Bilateral 2023   COLONOSCOPY     [place] Around 2015   ESOPHAGOGASTRODUODENOSCOPY  2016   [place]   LAPAROSCOPIC HYSTERECTOMY     partial    SHOULDER ARTHROSCOPY W/ LABRAL REPAIR Left 02/13/2016   left shoulder   Study: cognitive eval  06/23/2016   mild cog-linguistic impairment, difficulty with verbal fluency   Study: EEG   04/12/2015   Studies: EEG completed and normal.    Study: MRI  04/28/2016   Impression: numerous non-enhancing flair hyperintense lesions w/in the brain .Suspicious for demyelinating disease such as lyme or MS. Her Lyme test were negative.     FAMILY HISTORY: Family History  Problem Relation Age of Onset   Lung cancer Mother    Alcohol abuse Father    Throat cancer Father    Lung cancer Father    Depression Father    Heart attack Father    Breast cancer Sister    Alcohol abuse Brother    Diabetes Brother    Mental illness Sister    Stroke Daughter    Endometriosis Daughter    Heart disease Son    Breast cancer Maternal Aunt     Diabetes Maternal Grandmother    GER disease Daughter    Esophageal cancer Paternal Grandfather    Colon cancer Neg Hx    Rectal cancer Neg Hx    Stomach cancer Neg Hx     SOCIAL HISTORY: Social History   Socioeconomic History   Marital status: Single    Spouse name: Not on file   Number of children: 4   Years of education: two years of college   Highest education level: GED or equivalent  Occupational History   Occupation: retired  Tobacco Use   Smoking status: Former    Packs/day: 1.00    Years: 19.00    Total pack years: 19.00    Types: Cigarettes    Quit date: 2012    Years since quitting: 11.8   Smokeless tobacco: Never   Tobacco comments:    quit 6 years ago  Vaping Use   Vaping Use: Never used  Substance and Sexual Activity   Alcohol use: Yes    Alcohol/week: 1.0 standard drink of alcohol    Types: 1 Glasses of wine per week    Comment: twice a month   Drug use: Never   Sexual activity: Not Currently    Partners: Male  Other Topics Concern   Not on file  Social History Narrative   Single. Lived in [place] area, moved to [place] to be close to her daughters 2018. Lives with her daughter.   Some college, worked as Programmer, systems. Retired.    Social drinker. Former smoker.   Exercises routinely.    Wears dentures.    Drinks caffeine, takes a daily vitamin.   Smoke alarm in the home. Wears her seatbelt.    Feels safe in her relationships.   3-4 cups per day.   Four children (2 biological, 2 she raised as her own).   Social Determinants of Health   Financial Resource Strain: Low Risk  (12/12/2021)   Overall Financial Resource Strain (CARDIA)    Difficulty of Paying Living Expenses: Not hard at all  Food Insecurity: Unknown (12/12/2021)   Hunger Vital Sign    Worried About Running Out of Food in the Last Year: Patient refused    [place] in the Last Year: Patient refused  Transportation Needs: No Transportation Needs (12/12/2021)   PRAPARE -  Hydrologist (Medical): No    Lack of Transportation (Non-Medical): No  Physical Activity: Insufficiently Active (12/12/2021)   Exercise Vital Sign    Days of Exercise per Week: 1 day    Minutes of Exercise per Session: 50 min  Stress: Unknown (12/12/2021)   [place]    Feeling of Stress : Patient refused  Social Connections: Unknown (12/12/2021)   Social Connection and Isolation Panel [NHANES]    Frequency of Communication with Friends and Family: More than three times a week    Frequency of Social Gatherings with Friends and Family: Once a week    Attends Religious Services: Patient refused    Active Member of Clubs or Organizations: Yes    Attends Music therapist: More than 4 times per year    Marital Status: Patient refused  Intimate Partner Violence: Not At Risk (08/12/2021)   Humiliation, Afraid, Rape, and Kick questionnaire    Fear of Current or Ex-Partner: No    Emotionally Abused: No    Physically Abused: No    Sexually Abused: No   PHYSICAL EXAM  There were no vitals filed for this visit.   There is no height or weight on file to calculate BMI.  Generalized: Well developed, in no acute distress     01/06/2022   10:07 AM 01/06/2021    8:44 AM 12/19/2019    9:28 AM  MMSE - Mini Mental State Exam  Orientation to time '5 5 5  '$ Orientation to Place '5 5 5  '$ Registration '3 3 3  '$ Attention/ Calculation '4 1 5  '$ Recall '3 3 3  '$ Language- name 2 objects '2 2 2  '$ Language- repeat '1 1 1  '$ Language- follow 3 step command '3 3 3  '$ Language- read & follow direction '1 1 1  '$ Write a sentence '1 1 1  '$ Copy design '1 1 1  '$ Total score '29 26 30    '$ Neurological examination  Mentation: Alert oriented to time, place, history taking. Follows all commands speech and language fluent Cranial nerve II-XII: Pupils were equal round reactive to light. Extraocular movements were full, visual field  were full on confrontational test. Facial sensation and strength were normal. Head turning and shoulder shrug  were normal and symmetric. Motor: The motor testing reveals 5 over 5 strength of all 4 extremities. Good symmetric motor tone is noted throughout.  Sensory: Sensory testing is intact to soft touch on all 4 extremities. No evidence of extinction is noted.  Coordination: Cerebellar testing reveals good finger-nose-finger and heel-to-shin bilaterally.  Gait and station: Gait is normal. Tandem gait is unsteady. Reflexes: Deep tendon reflexes are symmetric and normal bilaterally.   DIAGNOSTIC DATA (LABS, IMAGING, TESTING) - I reviewed patient records, labs, notes, testing and imaging myself where available.  Lab Results  Component Value Date   WBC 6.1 06/02/2022   HGB 14.5 06/02/2022   HCT 43.5 06/02/2022   MCV 92.2 06/02/2022   PLT 210.0 06/02/2022      Component Value Date/Time   NA 139 06/02/2022 1006   NA 140 06/07/2017 0000   K 4.3 06/02/2022 1006   CL 107 06/02/2022  1006   CO2 28 06/02/2022 1006   GLUCOSE 85 06/02/2022 1006   BUN 16 06/02/2022 1006   BUN 16 06/07/2017 0000   CREATININE 0.90 06/02/2022 1006   CREATININE 0.99 07/03/2021 1344   CALCIUM 10.6 (H) 06/02/2022 1006   PROT 6.8 06/02/2022 1006   ALBUMIN 4.6 06/02/2022 1006   AST 16 06/02/2022 1006   ALT 18 06/02/2022 1006   ALKPHOS 94 06/02/2022 1006   BILITOT 0.4 06/02/2022 1006   Lab Results  Component Value Date   CHOL 202 (H) 06/02/2022   HDL 128.10 06/02/2022   LDLCALC 57 06/02/2022   LDLDIRECT 68 12/02/2020   TRIG 84.0 06/02/2022   CHOLHDL 2 06/02/2022   Lab Results  Component Value Date   HGBA1C 5.6 08/28/2018   Lab Results  Component Value Date   VITAMINB12 336 06/02/2022   Lab Results  Component Value Date   TSH 3.71 06/02/2022   ASSESSMENT AND PLAN 71 y.o. year old female  has a past medical history of Acid reflux, Brain injury ([place]) (2015), Bulging of cervical intervertebral disc  without myelopathy, Chicken pox, Constipation, Depression, Frequent headaches, GERD (gastroesophageal reflux disease), H/O TB skin testing, Hyperlipidemia, Hypertension, MS (multiple sclerosis) ([place]) (12/15/2016), Obstructive sleep apnea, Osteoporosis, Post concussion syndrome (10/2013), TBI (traumatic brain injury) ([place]) (2014), TIA (transient ischemic attack) (11/30/2016), and Urinary incontinence. here with:  1.  Mild memory problems, mild cognitive impairment   2.  Small vessel disease by MRI brain   3.  History of concussion in 2015  -Referral for neuropsychological testing, MMSE 29/30, more difficulty with memory, feels declining, underlying mood disorder; memory trouble started after head injury in 2015 -Continue Aricept 10 mg daily, may consider addition of Namenda depending on neuro psych findings -Remains on aspirin 81 mg daily -Encouraged to continue close follow-up with PCP for management of vascular risk factors -Encouraged exercise, social interaction -Dr. Jannifer Michael felt she didn't have MS, MRI of the brain showed mild nonspecific white matter changes, spinal cord evaluation and spinal fluid were unremarkable -Follow-up in 6 months or sooner if needed  Butler Denmark, Laqueta Jean, DNP 08/04/2022, 5:46 AM [facility] [address], [place], [place] [address] [phone]

## 2022-08-18 ENCOUNTER — Ambulatory Visit (INDEPENDENT_AMBULATORY_CARE_PROVIDER_SITE_OTHER): Payer: Medicare Other | Admitting: Psychology

## 2022-08-18 DIAGNOSIS — F4323 Adjustment disorder with mixed anxiety and depressed mood: Secondary | ICD-10-CM | POA: Diagnosis not present
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
## 2022-08-24 ENCOUNTER — Telehealth: Payer: Self-pay

## 2022-08-24 NOTE — Telephone Encounter (Signed)
Pharmacy already in process of filling

## 2022-08-24 NOTE — Telephone Encounter (Signed)
Patient states that pharmacy has sent multiple requests to have thyroid meds refilled.   [facility] [address]  levothyroxine (SYNTHROID) 25 MCG tablet [[id number]]

## 2022-08-25 ENCOUNTER — Encounter: Payer: Self-pay | Admitting: Gastroenterology

## 2022-08-26 DIAGNOSIS — Z23 Encounter for immunization: Secondary | ICD-10-CM | POA: Diagnosis not present

## 2022-09-01 ENCOUNTER — Ambulatory Visit (INDEPENDENT_AMBULATORY_CARE_PROVIDER_SITE_OTHER): Payer: Medicare Other

## 2022-09-01 DIAGNOSIS — Z0000 Encounter for general adult medical examination without abnormal findings: Secondary | ICD-10-CM | POA: Diagnosis not present

## 2022-09-01 NOTE — Patient Instructions (Signed)

## 2022-09-01 NOTE — Progress Notes (Signed)
Subjective:   Abigail Michael is a 71 y.o. female who presents for Medicare Annual (Subsequent) preventive examination. I connected with  Abigail Michael on 09/01/22 by a audio enabled telemedicine application and verified that I am speaking with the correct person using two identifiers.  Patient Location: Home  Provider Location: Office/Clinic  I discussed the limitations of evaluation and management by telemedicine. The patient expressed understanding and agreed to proceed.  Review of Systems    Defer to PCP        Objective:    There were no vitals filed for this visit. There is no height or weight on file to calculate BMI.     09/01/2022    2:57 PM 08/06/2020    9:04 AM 07/17/2019   10:40 AM 09/05/2018    2:53 PM 08/22/2018    8:52 AM  Advanced Directives  Does Patient Have a Medical Advance Directive? Yes No Yes No No  Type of Paramedic of [place];Living will  [place];Living will    Copy of [place] in Chart?   No - copy requested    Would patient like information on creating a medical advance directive?  Yes (MAU/Ambulatory/Procedural Areas - Information given)  No - Patient declined Yes (MAU/Ambulatory/Procedural Areas - Information given)    Current Medications (verified) Outpatient Encounter Medications as of 09/01/2022  Medication Sig   amLODipine (NORVASC) 10 MG tablet Take 1 tablet (10 mg total) by mouth daily.   aspirin EC 81 MG tablet Take 81 mg by mouth daily.    atorvastatin (LIPITOR) 40 MG tablet Take 1 tablet (40 mg total) by mouth daily.   buPROPion (WELLBUTRIN SR) 200 MG 12 hr tablet Take 1 tablet (200 mg total) by mouth 2 (two) times daily.   Cholecalciferol (VITAMIN D) 50 MCG (2000 UT) tablet    esomeprazole (NEXIUM) 40 MG capsule TAKE ONE CAPSULE BY MOUTH ONE TIME DAILY AT NOON   famotidine (PEPCID) 20 MG tablet Take 1 tablet (20 mg total) by mouth daily.   FLUoxetine (PROZAC) 40 MG  capsule Take 1 capsule (40 mg total) by mouth daily.   levothyroxine (SYNTHROID) 25 MCG tablet Take 1 tablet (25 mcg total) by mouth daily before breakfast. On an empty stomach   mirabegron ER (MYRBETRIQ) 50 MG TB24 tablet Take 1 tablet (50 mg total) by mouth daily.   Omega 3 1200 MG CAPS    OVER THE COUNTER MEDICATION Neuro-Alpha 2 caps QD   TURMERIC PO Take by mouth.  (Patient not taking: Reported on 09/01/2022)   No facility-administered encounter medications on file as of 09/01/2022.    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 many MRI w/ positive white matter abnormality. MRI reported stable since 2015. mild progressive  Cognitive loss   Obstructive sleep apnea    on prior neuro notes 10/2016; no results.    Osteoporosis    had been on reclast  Post concussion syndrome 10/2013   ongoing issues with concentration, memory loss, lability; EEG completed and normal.    TBI (traumatic brain injury) ([place]) 2014   fall at [facility], hit head off of floor. No bleed, "concussion". Seen neurology since for focus and word finding.    TIA (transient ischemic attack) 11/30/2016   MRI normal. No lateralization. Speech affected and weakness during a hypotensive episdoe.    Urinary incontinence    Past Surgical History:  Procedure Laterality Date   BACK SURGERY  02/2015   herniated disc L4/5/6   BREAST BIOPSY  1988   biopsy   catract Bilateral 2023   COLONOSCOPY     [place] Around 2015   ESOPHAGOGASTRODUODENOSCOPY  2016   [place]   LAPAROSCOPIC  HYSTERECTOMY     partial    SHOULDER ARTHROSCOPY W/ LABRAL REPAIR Left 02/13/2016   left shoulder   Study: cognitive eval  06/23/2016   mild cog-linguistic impairment, difficulty with verbal fluency   Study: EEG   04/12/2015   Studies: EEG completed and normal.    Study: MRI  04/28/2016   Impression: numerous non-enhancing flair hyperintense lesions w/in the brain .Suspicious for demyelinating disease such as lyme or MS. Her Lyme test were negative.    Family History  Problem Relation Age of Onset   Lung cancer Mother    Alcohol abuse Father    Throat cancer Father    Lung cancer Father    Depression Father    Heart attack Father    Breast cancer Sister    Alcohol abuse Brother    Diabetes Brother    Mental illness Sister    Stroke Daughter    Endometriosis Daughter    Heart disease Son    Breast cancer Maternal Aunt    Diabetes Maternal Grandmother    GER disease Daughter    Esophageal cancer Paternal Grandfather    Colon cancer Neg Hx    Rectal cancer Neg Hx    Stomach cancer Neg Hx    Social History   Socioeconomic History   Marital status: Single    Spouse name: Not on file   Number of children: 4   Years of education: two years of college   Highest education level: GED or equivalent  Occupational History   Occupation: retired  Tobacco Use   Smoking status: Former    Packs/day: 1.00    Years: 19.00    Total pack years: 19.00    Types: Cigarettes    Quit date: 2012    Years since quitting: 11.8   Smokeless tobacco: Never   Tobacco comments:    quit 6 years ago  Vaping Use   Vaping Use: Never used  Substance and Sexual Activity   Alcohol use: Yes    Alcohol/week: 1.0 standard drink of alcohol    Types: 1 Glasses of wine per week    Comment: twice a month   Drug use: Never   Sexual activity: Not Currently    Partners: Male  Other Topics Concern   Not on file  Social History Narrative   Single. Lived in [place] area, moved to [place] to be close to her  daughters 2018. Lives with her daughter.   Some college, worked as Programmer, systems. Retired.    Social drinker. Former smoker.   Exercises routinely.    Wears dentures.    Drinks caffeine, takes a daily vitamin.   Smoke alarm in the home. Wears her seatbelt.    Feels safe  in her relationships.   3-4 cups per day.   Four children (2 biological, 2 she raised as her own).   Social Determinants of Health   Financial Resource Strain: Low Risk  (09/01/2022)   Overall Financial Resource Strain (CARDIA)    Difficulty of Paying Living Expenses: Not hard at all  Food Insecurity: No Food Insecurity (09/01/2022)   Hunger Vital Sign    Worried About Running Out of Food in the Last Year: Never true    Ran Out of Food in the Last Year: Never true  Transportation Needs: No Transportation Needs (09/01/2022)   PRAPARE - Hydrologist (Medical): No    Lack of Transportation (Non-Medical): No  Physical Activity: Insufficiently Active (09/01/2022)   Exercise Vital Sign    Days of Exercise per Week: 1 day    Minutes of Exercise per Session: 50 min  Stress: No Stress Concern Present (09/01/2022)   [place]    Feeling of Stress : Not at all  Social Connections: Unknown (09/01/2022)   Social Connection and Isolation Panel [NHANES]    Frequency of Communication with Friends and Family: More than three times a week    Frequency of Social Gatherings with Friends and Family: Once a week    Attends Religious Services: Patient refused    Marine scientist or Organizations: Yes    Attends Music therapist: More than 4 times per year    Marital Status: Patient refused    Tobacco Counseling Counseling given: Not Answered Tobacco comments: quit 6 years ago   Clinical Intake:  Pre-visit preparation completed: Yes  Pain : No/denies pain     Nutritional Risks: None Diabetes: No  How often do  you need to have someone help you when you read instructions, pamphlets, or other written materials from your doctor or pharmacy?: 1 - Never What is the last grade level you completed in school?: [place]  Interpreter Needed?: No  Information entered by :: Edgar Frisk   Activities of Daily Living    09/01/2022    2:58 PM  In your present state of health, do you have any difficulty performing the following activities:  Hearing? 0  Vision? 0  Difficulty concentrating or making decisions? 0  Walking or climbing stairs? 0  Dressing or bathing? 0  Doing errands, shopping? 0  Preparing Food and eating ? N  Using the Toilet? N  In the past six months, have you accidently leaked urine? N  Do you have problems with loss of bowel control? N  Managing your Medications? N  Managing your Finances? N  Housekeeping or managing your Housekeeping? N    Patient Care Team: Ma Hillock, DO as PCP - General (Family Medicine) Sharlene Dory, MD as Referring Physician (Neurology) Tommi Rumps (Dentistry) Wallace Going (Orthodontics) Margarette Canada (Oral Surgery) Basuroski, Jacqulynn Cadet, MD as Referring Physician (Psychiatry) Darleen Crocker, MD as Consulting Physician (Ophthalmology)  Indicate any recent Medical Services you may have received from other than [facility] providers in the past year (date may be approximate).     Assessment:   This is a routine wellness examination for [place].  Hearing/Vision screen No results found.  Dietary issues and exercise activities discussed: Current Exercise Habits: Home exercise routine, Type of exercise: walking, Time (Minutes): 40, Frequency (Times/Week): 7, Weekly Exercise (Minutes/Week): 280, Intensity: Mild   Goals Addressed   None   Depression Screen  09/01/2022    2:56 PM 06/02/2022   10:06 AM 12/16/2021   10:33 AM 08/12/2021    9:23 AM 07/30/2021    8:56 AM 07/03/2021    1:17 PM 12/02/2020    3:29 PM  PHQ 2/9 Scores  PHQ - 2  Score 0 0 0 0 0 4 0  PHQ- 9 Score  0 0 0  7 0    Fall Risk    09/01/2022    2:57 PM 12/16/2021   10:26 AM 12/12/2021    9:15 AM 08/12/2021    9:04 AM 07/30/2021    8:56 AM  Fall Risk   Falls in the past year? 1 0 1  0  Number falls in past yr: 0 0 1 0 0  Injury with Fall? 0 0 0 0 0  Risk for fall due to : No Fall Risks No Fall Risks     Follow up Falls evaluation completed Falls evaluation completed  Falls evaluation completed;Falls prevention discussed Falls evaluation completed    FALL RISK PREVENTION PERTAINING TO THE HOME:  Any stairs in or around the home? No  If so, are there any without handrails? No  Home free of loose throw rugs in walkways, pet beds, electrical cords, etc? Yes  Adequate lighting in your home to reduce risk of falls? Yes   ASSISTIVE DEVICES UTILIZED TO PREVENT FALLS:  Life alert? No  Use of a cane, walker or w/c? No  Grab bars in the bathroom? No  Shower chair or bench in shower? No  Elevated toilet seat or a handicapped toilet? No   TIMED UP AND GO:  Was the test performed? No .  Length of time to ambulate 10 feet: 0 sec.     Cognitive Function:    01/06/2022   10:07 AM 01/06/2021    8:44 AM 12/19/2019    9:28 AM 07/17/2019   10:41 AM  MMSE - Mini Mental State Exam  Orientation to time '5 5 5 5  '$ Orientation to Place '5 5 5 5  '$ Registration '3 3 3 3  '$ Attention/ Calculation '4 1 5 5  '$ Recall '3 3 3 1  '$ Language- name 2 objects '2 2 2 2  '$ Language- repeat '1 1 1 1  '$ Language- follow 3 step command '3 3 3 3  '$ Language- read & follow direction '1 1 1 1  '$ Write a sentence '1 1 1 1  '$ Copy design '1 1 1 1  '$ Total score '29 26 30 28        '$ 09/01/2022    2:58 PM 08/06/2020    9:15 AM  6CIT Screen  What Year? 0 points 0 points  What month? 0 points 0 points  What time? 0 points 0 points  Count back from 20 0 points 0 points  Months in reverse 0 points 0 points  Repeat phrase 0 points 0 points  Total Score 0 points 0 points    Immunizations Immunization  History  Administered Date(s) Administered   Fluad Quad(high Dose 65+) 09/16/2020, 07/03/2021   Influenza, High Dose Seasonal PF 08/28/2018, 06/13/2019   PFIZER(Purple Top)SARS-COV-2 Vaccination 11/09/2019, 12/03/2019, 09/16/2020   Pneumococcal Conjugate-13 12/02/2020   Pneumococcal Polysaccharide-23 08/28/2018   Zoster Recombinat (Shingrix) 05/10/2019, 09/10/2019    TDAP status: Due, Education has been provided regarding the importance of this vaccine. Advised may receive this vaccine at local pharmacy or Health Dept. Aware to provide a copy of the vaccination record if obtained from local pharmacy or Health Dept. Verbalized  acceptance and understanding.  Flu Vaccine status: Due, Education has been provided regarding the importance of this vaccine. Advised may receive this vaccine at local pharmacy or Health Dept. Aware to provide a copy of the vaccination record if obtained from local pharmacy or Health Dept. Verbalized acceptance and understanding.  Pneumococcal vaccine status: Up to date  Covid-19 vaccine status: Declined, Education has been provided regarding the importance of this vaccine but patient still declined. Advised may receive this vaccine at local pharmacy or Health Dept.or vaccine clinic. Aware to provide a copy of the vaccination record if obtained from local pharmacy or Health Dept. Verbalized acceptance and understanding.  Qualifies for Shingles Vaccine? Yes   Zostavax completed  n/a   Shingrix Completed?: Yes  Screening Tests Health Maintenance  Topic Date Due   TETANUS/TDAP  12/17/2022 (Originally 10/13/1970)   INFLUENZA VACCINE  01/16/2023 (Originally 05/18/2022)   COLONOSCOPY (Pts 45-49yr Insurance coverage will need to be confirmed)  09/04/2022   Medicare Annual Wellness (AWV)  09/02/2023   MAMMOGRAM  10/17/2023   Pneumonia Vaccine 72 Years old  Completed   DEXA SCAN  Completed   Hepatitis C Screening  Completed   Zoster Vaccines- Shingrix  Completed   HPV  VACCINES  Aged Out   COVID-19 Vaccine  Discontinued    Health Maintenance  There are no preventive care reminders to display for this patient.   Colorectal cancer screening: Type of screening: Colonoscopy. Completed 09/05/2019. Repeat every 3 years  Mammogram status: Completed 10/16/2021. Repeat every year 2 years  Bone Density status: Completed 09/17/2021. Results reflect: Bone density results: OSTEOPOROSIS. Repeat every 0 years.  Lung Cancer Screening: (Low Dose CT Chest recommended if Age 72-80years, 30 pack-year currently smoking OR have quit w/in 15years.) does not qualify.   Lung Cancer Screening Referral: n/a  Additional Screening:  Hepatitis C Screening: does qualify; Completed 05/07/2019  Vision Screening: Recommended annual ophthalmology exams for early detection of glaucoma and other disorders of the eye. Is the patient up to date with their annual eye exam?  Yes  Who is the provider or what is the name of the office in which the patient attends annual eye exams? [facility] If pt is not established with a provider, would they like to be referred to a provider to establish care?  N/A .   Dental Screening: Recommended annual dental exams for proper oral hygiene  Community Resource Referral / Chronic Care Management: CRR required this visit?  No   CCM required this visit?  No      Plan:     I have personally reviewed and noted the following in the patient's chart:   Medical and social history Use of alcohol, tobacco or illicit drugs  Current medications and supplements including opioid prescriptions. Patient is not currently taking opioid prescriptions. Functional ability and status Nutritional status Physical activity Advanced directives List of other physicians Hospitalizations, surgeries, and ER visits in previous 12 months Vitals Screenings to include cognitive, depression, and falls Referrals and appointments  In addition, I have  reviewed and discussed with patient certain preventive protocols, quality metrics, and best practice recommendations. A written personalized care plan for preventive services as well as general preventive health recommendations were provided to patient.     GEdgar Frisk [facility]  09/01/2022   Nurse Notes: Non Face to Face 30 minute visit    Ms. Duan , Thank you for taking time to come for your Medicare Wellness Visit. I appreciate  your ongoing commitment to your health goals. Please review the following plan we discussed and let me know if I can assist you in the future.   These are the goals we discussed:  Goals      Patient Stated     Be more active by walking more & eat healthier     Patient Stated     Maintain current healthy lifestyle        This is a list of the screening recommended for you and due dates:  Health Maintenance  Topic Date Due   Tetanus Vaccine  12/17/2022*   Flu Shot  01/16/2023*   Colon Cancer Screening  09/04/2022   Medicare Annual Wellness Visit  09/02/2023   Mammogram  10/17/2023   Pneumonia Vaccine  Completed   DEXA scan (bone density measurement)  Completed   Hepatitis C Screening: USPSTF Recommendation to screen - Ages 38-79 yo.  Completed   Zoster (Shingles) Vaccine  Completed   HPV Vaccine  Aged Out   COVID-19 Vaccine  Discontinued  *Topic was postponed. The date shown is not the original due date.

## 2022-09-22 ENCOUNTER — Ambulatory Visit (INDEPENDENT_AMBULATORY_CARE_PROVIDER_SITE_OTHER): Payer: Medicare Other | Admitting: Psychology

## 2022-09-22 DIAGNOSIS — F4323 Adjustment disorder with mixed anxiety and depressed mood: Secondary | ICD-10-CM | POA: Diagnosis not present

## 2022-09-22 NOTE — Progress Notes (Signed)
[facility] Counselor Initial Adult Exam  Name: Abigail Michael Date: 09/22/2022 MRN: [id number] DOB: 1950-10-20 PCP: Howard Pouch A, DO  Time spent: 10:00am-10:50am    50 minutes  Guardian/Payee:  Crista Curb requested: No   Reason for Visit /Presenting Problem: Pt present for face-to-face initial assessment update in person. Pt states she tends to get frustrated bc of family issues and having trouble with directions at times.  Pt states she feels stressed easily.  Pt feels like she is not "where she is suppose to be" right now.   She envies people who own a home.  Pt is living in an apartment but use to own her own home.  Pt has made progress connecting with people and making friends.   She still wants to work on self esteem and sticking up for herself.  Reviewed pt's treatment plan for annual update.  Updated pt's treatment plan  IA.   Pt participated in setting treatment goals.   Plan to meet monthly.  Mental Status Exam: Appearance:   Casual     Behavior:  Appropriate  Motor:  Normal  Speech/Language:   Normal Rate  Affect:  Appropriate  Mood:  normal  Thought process:  normal  Thought content:    WNL  Sensory/Perceptual disturbances:    WNL  Orientation:  oriented to person, place, time/date, and situation  Attention:  Good  Concentration:  Good  Memory:  WNL  Fund of knowledge:   Good  Insight:    Good  Judgment:   Good  Impulse Control:  Good     Reported Symptoms:  frustration  Risk Assessment: Danger to Self:  No Self-injurious Behavior: No Danger to Others: No Duty to Warn:no Physical Aggression / Violence:No  Access to Firearms a concern: No  Gang Involvement:No  Patient / guardian was educated about steps to take if suicide or homicide risk level increases between visits: n/a While future psychiatric events cannot be accurately predicted, the patient does not currently require acute inpatient psychiatric care and does not currently  meet [facility] involuntary commitment criteria.  Substance Abuse History: Current substance abuse: No     Past Psychiatric History:   Previous psychological history is significant for depression Outpatient Providers:pt has been in therapy in the past. History of Psych Hospitalization: Yes  Psychological Testing:  n/a    Abuse History:  Victim of: No.,  n/a    Report needed: No. Victim of Neglect:No. Perpetrator of  n/a   Witness / Exposure to Domestic Violence: No   Protective Services Involvement: No  Witness to [employer] Violence:  No   Family History:  Family History  Problem Relation Age of Onset   Lung cancer Mother    Alcohol abuse Father    Throat cancer Father    Lung cancer Father    Depression Father    Heart attack Father    Breast cancer Sister    Alcohol abuse Brother    Diabetes Brother    Mental illness Sister    Stroke Daughter    Endometriosis Daughter    Heart disease Son    Breast cancer Maternal Aunt    Diabetes Maternal Grandmother    GER disease Daughter    Esophageal cancer Paternal Grandfather    Colon cancer Neg Hx    Rectal cancer Neg Hx    Stomach cancer Neg Hx     Living situation: the patient lives alone  Pt grew up with both parents.  Pt is one of 7 children and pt is number 2 in the birth order.   Pt's father was alcoholic.   Pt had alot of responsibility growing up.  Both of pt's parents worked.  Pt helped care for her younger siblings.  Pt is close to her siblings.  Family history of mental health issues:  Pt's father had depression.   No childhood abuse.    Sexual Orientation: Straight  Relationship Status: divorced  Name of spouse / other:n/a If a parent, number of children / ages:pt has two adult daughters.  Support Systems: friends Pt's daughters are a support.  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    She feels lonely at times.      Strengths: Supportive Relationships, Hopefulness, Self Advocate, and Able to Communicate Effectively  Barriers:  none   Legal History: Pending legal issue / charges: The patient has no significant history of legal issues. 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 many MRI w/ positive white matter abnormality. MRI reported stable since 2015. mild progressive  Cognitive loss   Obstructive sleep apnea    on prior neuro notes 10/2016; no results.    Osteoporosis    had been on reclast   Post concussion syndrome 10/2013   ongoing issues with concentration, memory loss, lability; EEG completed and normal.    TBI (traumatic brain injury) ([place]) 2014   fall at [facility], hit head off of floor. No bleed, "concussion". Seen neurology since for focus and word finding.    TIA (transient ischemic attack) 11/30/2016   MRI normal. No lateralization. Speech affected and weakness during a hypotensive episdoe.    Urinary incontinence     Past Surgical History:  Procedure Laterality Date   BACK SURGERY  02/2015   herniated disc L4/5/6   BREAST BIOPSY  1988   biopsy   catract Bilateral 2023   COLONOSCOPY     [place]. Around 2015   ESOPHAGOGASTRODUODENOSCOPY  2016    [place]   LAPAROSCOPIC HYSTERECTOMY     partial    SHOULDER ARTHROSCOPY W/ LABRAL REPAIR Left 02/13/2016   left shoulder   Study: cognitive eval  06/23/2016   mild cog-linguistic impairment, difficulty with verbal fluency   Study: EEG   04/12/2015   Studies: EEG completed and normal.    Study: MRI  04/28/2016   Impression: numerous non-enhancing flair hyperintense lesions w/in the brain .Suspicious for demyelinating disease such as lyme or MS. Her Lyme test were negative.     Medications: Current Outpatient Medications  Medication Sig Dispense Refill   amLODipine (NORVASC) 10 MG tablet Take 1 tablet (10 mg total) by  mouth daily. 90 tablet 1   aspirin EC 81 MG tablet Take 81 mg by mouth daily.      atorvastatin (LIPITOR) 40 MG tablet Take 1 tablet (40 mg total) by mouth daily. 90 tablet 3   buPROPion (WELLBUTRIN SR) 200 MG 12 hr tablet Take 1 tablet (200 mg total) by mouth 2 (two) times daily. 180 tablet 1   Cholecalciferol (VITAMIN D) 50 MCG (2000 UT) tablet      esomeprazole (NEXIUM) 40 MG capsule TAKE ONE CAPSULE BY MOUTH ONE TIME DAILY AT NOON 90 capsule 1   famotidine (PEPCID) 20 MG tablet Take 1 tablet (20 mg total) by mouth daily. 90 tablet 1   FLUoxetine (PROZAC) 40 MG capsule Take 1 capsule (40 mg total) by mouth daily. 90 capsule 1   levothyroxine (SYNTHROID) 25 MCG tablet Take 1 tablet (25 mcg total) by mouth daily before breakfast. On an empty stomach 90 tablet 3   mirabegron ER (MYRBETRIQ) 50 MG TB24 tablet Take 1 tablet (50 mg total) by mouth daily. 90 tablet 3   Omega 3 1200 MG CAPS      OVER THE COUNTER MEDICATION Neuro-Alpha 2 caps QD     TURMERIC PO Take by mouth.  (Patient not taking: Reported on 09/01/2022)     No current facility-administered medications for this visit.    Allergies  Allergen Reactions   Bee Venom Anaphylaxis    Other reaction(s): Anaphylaxis   Amoxicillin Dermatitis    Yeast infection Other reaction(s): Dermatitis/Yeast Infection     Diagnoses:  F43.23  Plan of Care: Pt participated in setting treatment goals.   Plan to meet monthly.  Treatment Plan (target date: 09/23/2023) Client Abilities/Strengths  Pt is bright, engaging, and motivated for therapy.  Client Treatment Preferences  Individual therapy.  Client Statement of Needs  Improve copings skills and Improve self esteem.  Symptoms  Depressed or irritable mood. Excessive and/or unrealistic worry that is difficult to control occurring more days than not for at least 6 months about a number of events or activities. Hypervigilance (e.g., feeling constantly on edge, experiencing concentration difficulties, having trouble falling or staying asleep, exhibiting a general state of irritability). Low self-esteem. Problems Addressed  Unipolar Depression, Anxiety Goals 1. Alleviate depressive symptoms and return to previous level of effective functioning. 2. Appropriately grieve the loss in order to normalize mood and to return to previously adaptive level of functioning. Objective Learn and implement behavioral strategies to overcome depression. Target Date: 2023-09-23 Frequency: Monthly  Progress: 50 Modality: individual  Related Interventions Assist the client in developing skills that increase the likelihood of deriving pleasure from behavioral activation (e.g., assertiveness skills, developing an exercise plan, less internal/more external focus, increased social involvement); reinforce success. Engage the client in "behavioral activation," increasing his/her activity level and contact with sources of reward, while identifying processes that inhibit activation. use behavioral techniques such as instruction, rehearsal, role-playing, role reversal, as needed, to facilitate activity in the client's daily life; reinforce success. 3. Develop healthy interpersonal relationships that lead to the alleviation and help prevent the relapse of depression. 4. Develop healthy  thinking patterns and beliefs about self, others, and the world that lead to the alleviation and help prevent the relapse of depression. 5. Enhance ability to effectively cope with the full variety of life's worries and anxieties. 6. Learn and implement coping skills that result in a reduction of anxiety and worry, and improved daily functioning. Objective Learn and implement problem-solving strategies for realistically addressing worries. Target  Date: 2023-09-23 Frequency: Monthly  Progress: 50 Modality: individual  Related Interventions Assign the client a homework exercise in which he/she problem-solves a current problem (see Mastery of Your Anxiety and Worry: Workbook by Adora Fridge and Eliot Ford or Generalized Anxiety Disorder by Eather Colas, and Eliot Ford); review, reinforce success, and provide corrective feedback toward improvement. Teach the client problem-solving strategies involving specifically defining a problem, generating options for addressing it, evaluating the pros and cons of each option, selecting and implementing an optional action, and reevaluating and refining the action. Objective Learn and implement calming skills to reduce overall anxiety and manage anxiety symptoms. Target Date: 2023-09-23 Frequency: Monthly  Progress: 50 Modality: individual  Related Interventions Assign the client to read about progressive muscle relaxation and other calming strategies in relevant books or treatment manuals (e.g., Progressive Relaxation Training by Leroy Kennedy; Mastery of Your Anxiety and Worry: Workbook by Beckie Busing). Assign the client homework each session in which he/she practices relaxation exercises daily, gradually applying them progressively from non-anxiety-provoking to anxiety-provoking situations; review and reinforce success while providing corrective feedback toward improvement. Teach the client calming/relaxation skills (e.g., applied relaxation, progressive  muscle relaxation, cue controlled relaxation; mindful breathing; biofeedback) and how to discriminate better between relaxation and tension; teach the client how to apply these skills to his/her daily life. 7. Recognize, accept, and cope with feelings of depression. 8. Reduce overall frequency, intensity, and duration of the anxiety so that daily functioning is not impaired. 9. Resolve the core conflict that is the source of anxiety. 10. Stabilize anxiety level while increasing ability to function on a daily basis. Diagnosis F43.23 Conditions For Discharge Achievement of treatment goals and objectives    Clint Bolder, LCSW

## 2022-11-17 ENCOUNTER — Ambulatory Visit (INDEPENDENT_AMBULATORY_CARE_PROVIDER_SITE_OTHER): Payer: Medicare Other | Admitting: Family Medicine

## 2022-11-17 ENCOUNTER — Encounter: Payer: Self-pay | Admitting: Family Medicine

## 2022-11-17 VITALS — BP 119/78 | HR 69 | Temp 98.3°F | Wt 157.2 lb

## 2022-11-17 DIAGNOSIS — E034 Atrophy of thyroid (acquired): Secondary | ICD-10-CM

## 2022-11-17 DIAGNOSIS — S069X9S Unspecified intracranial injury with loss of consciousness of unspecified duration, sequela: Secondary | ICD-10-CM | POA: Diagnosis not present

## 2022-11-17 DIAGNOSIS — K219 Gastro-esophageal reflux disease without esophagitis: Secondary | ICD-10-CM

## 2022-11-17 DIAGNOSIS — N1831 Chronic kidney disease, stage 3a: Secondary | ICD-10-CM

## 2022-11-17 DIAGNOSIS — I10 Essential (primary) hypertension: Secondary | ICD-10-CM

## 2022-11-17 DIAGNOSIS — Z79899 Other long term (current) drug therapy: Secondary | ICD-10-CM | POA: Diagnosis not present

## 2022-11-17 DIAGNOSIS — E782 Mixed hyperlipidemia: Secondary | ICD-10-CM | POA: Diagnosis not present

## 2022-11-17 DIAGNOSIS — N3281 Overactive bladder: Secondary | ICD-10-CM

## 2022-11-17 DIAGNOSIS — G3184 Mild cognitive impairment, so stated: Secondary | ICD-10-CM

## 2022-11-17 DIAGNOSIS — Z5181 Encounter for therapeutic drug level monitoring: Secondary | ICD-10-CM | POA: Diagnosis not present

## 2022-11-17 DIAGNOSIS — G3281 Cerebellar ataxia in diseases classified elsewhere: Secondary | ICD-10-CM | POA: Diagnosis not present

## 2022-11-17 DIAGNOSIS — M810 Age-related osteoporosis without current pathological fracture: Secondary | ICD-10-CM | POA: Diagnosis not present

## 2022-11-17 MED ORDER — FAMOTIDINE 20 MG PO TABS: 20.0000 mg | ORAL_TABLET | Freq: Every day | ORAL | 1 refills | Status: DC

## 2022-11-17 MED ORDER — ESOMEPRAZOLE MAGNESIUM 40 MG PO CPDR: DELAYED_RELEASE_CAPSULE | ORAL | 1 refills | Status: DC

## 2022-11-17 MED ORDER — BUPROPION HCL ER (SR) 200 MG PO TB12: 200.0000 mg | ORAL_TABLET | Freq: Two times a day (BID) | ORAL | 1 refills | Status: DC

## 2022-11-17 MED ORDER — LEVOTHYROXINE SODIUM 25 MCG PO TABS: 25.0000 ug | ORAL_TABLET | Freq: Every day | ORAL | 3 refills | Status: DC

## 2022-11-17 MED ORDER — AMLODIPINE BESYLATE 10 MG PO TABS: 10.0000 mg | ORAL_TABLET | Freq: Every day | ORAL | 1 refills | Status: DC

## 2022-11-17 MED ORDER — MIRABEGRON ER 50 MG PO TB24: 50.0000 mg | ORAL_TABLET | Freq: Every day | ORAL | 3 refills | Status: DC

## 2022-11-17 MED ORDER — FLUOXETINE HCL 40 MG PO CAPS: 40.0000 mg | ORAL_CAPSULE | Freq: Every day | ORAL | 1 refills | Status: DC

## 2022-11-17 MED ORDER — ATORVASTATIN CALCIUM 40 MG PO TABS: 40.0000 mg | ORAL_TABLET | Freq: Every day | ORAL | 3 refills | Status: DC

## 2022-11-17 NOTE — Patient Instructions (Addendum)
Return in about 24 weeks (around 05/04/2023) for Routine chronic condition follow-up.  Fasting labs next appt.       Great to see you today.  I have refilled the medication(s) we provide.   If labs were collected, we will inform you of lab results once received either by echart message or telephone call.   - echart message- for normal results that have been seen by the patient already.   - telephone call: abnormal results or if patient has not viewed results in their echart.

## 2022-11-17 NOTE — Progress Notes (Signed)
Patient ID: Abigail Michael, female  DOB: April 15, 1951, 72 y.o.   MRN: [id number] Patient Care Team    Relationship Specialty Notifications Start End  Ma Hillock, DO PCP - General Family Medicine  01/16/18   Sharlene Dory, MD Referring Physician Neurology  08/28/18   Tommi Rumps  Dentistry  07/17/19   [name]  Orthodontics  07/17/19   Feliberto Harts R  Oral Surgery  07/17/19   Delanna Ahmadi, MD Referring Physician Psychiatry  07/17/19    Comment: Neurology  Darleen Crocker, MD Consulting Physician Ophthalmology  12/16/21    Comment: cataract    Chief Complaint  Patient presents with   Hypertension    Subjective: Abigail Michael is a 72 y.o.  female present for follow up cmc Depression with anxiety Patient reports she is feeling good on Wellbutrin and prozac.    She is still in a group of online people within her age bracket they get together and go out to eat, go bowling etc. she now has her own apartment and is enjoying decorating it.  She reports she has found some new friends and they go out and dance a few times a week.   She has been referred to a therapist.   Essential hypertension/HLD/morbid obesity/h/o TIA: Pt reports compliance with amlodipine 10 mg QD. Patient denies chest pain, shortness of breath, dizziness or lower extremity edema.  Pt takes a  daily baby ASA. Pt is  prescribed statin. H/O TIA.  Diet: Low sodium Exercise: Patient reports she has become more active getting out walking more routinely and feeling better. RF: HTN, HLD, Obesity, h/o CVA, FHX MI   GERD/PPI:  Patient reports compliance with Nexium and Pepcid.    Marland Kitchen   Hypothyroidism:  Patient reports compliance with 25 mcg levothyroxine daily.     Overactive bladder/MS-white matter disease/TIA/TBI/ataxia: Patient reports compliance with her Myrbetriq and it is working well or her.  She had been prescribed Aricept 10 mg nightly, she has discontinued that medication.      11/17/2022    8:17 AM  09/01/2022    2:56 PM 06/02/2022   10:06 AM 12/16/2021   10:33 AM 08/12/2021    9:23 AM  Depression screen PHQ 2/9  Decreased Interest 0 0 0 0 0  Down, Depressed, Hopeless 0 0 0 0 0  PHQ - 2 Score 0 0 0 0 0  Altered sleeping 0  0 0 0  Tired, decreased energy 1  0 0 0  Change in appetite 0  0 0 0  Feeling bad or failure about yourself  0  0 0 0  Trouble concentrating 0  0 0 0  Moving slowly or fidgety/restless 0  0 0 0  Suicidal thoughts 0  0 0 0  PHQ-9 Score 1  0 0 0  Difficult doing work/chores   Not difficult at all        11/17/2022    8:17 AM 06/02/2022   10:06 AM 12/16/2021   10:34 AM 07/03/2021    1:18 PM  GAD 7 : Generalized Anxiety Score  Nervous, Anxious, on Edge 0 0 0 2  Control/stop worrying 0 0 0 2  Worry too much - different things  0 0 2  Trouble relaxing 0 0 0 0  Restless 0 0 0 0  Easily annoyed or irritable 0 0 0 2  Afraid - awful might happen 0 0 0 2  Total GAD 7 Score  0 0 10  Anxiety Difficulty  Not difficult at all            11/17/2022    8:17 AM 09/01/2022    2:57 PM 12/16/2021   10:26 AM 12/12/2021    9:15 AM 08/12/2021    9:04 AM  Fall Risk   Falls in the past year? 1 1 0 1   Number falls in past yr: 0 0 0 1 0  Injury with Fall? 0 0 0 0 0  Risk for fall due to :  No Fall Risks No Fall Risks    Follow up Falls evaluation completed Falls evaluation completed Falls evaluation completed  Falls evaluation completed;Falls prevention discussed     Immunization History  Administered Date(s) Administered   Fluad Quad(high Dose 65+) 09/16/2020, 07/03/2021   Influenza Split 07/26/2012   Influenza, High Dose Seasonal PF 08/28/2018, 06/13/2019   Influenza, Seasonal, Injecte, Preservative Fre 07/24/2010   PFIZER(Purple Top)SARS-COV-2 Vaccination 11/09/2019, 12/03/2019, 09/16/2020   Pneumococcal Conjugate-13 12/02/2020   Pneumococcal Polysaccharide-23 08/28/2018   Td (Adult),unspecified 10/18/1998   Tdap 02/06/2007   Zoster Recombinat (Shingrix)  05/10/2019, 09/10/2019   Zoster, Live 06/16/2011    No results found.  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 many MRI w/ positive white matter abnormality. MRI reported stable since 2015. mild progressive  Cognitive loss   Obstructive sleep apnea    on prior neuro notes 10/2016; no results.    Osteoporosis    had been on reclast   Post concussion syndrome 10/2013   ongoing issues with concentration, memory loss, lability; EEG completed and normal.    TBI (traumatic brain injury) ([place]) 2014   fall at [facility], hit head off of floor. No bleed, "concussion". Seen neurology since for focus and word finding.    TIA (transient ischemic attack) 11/30/2016   MRI normal. No lateralization. Speech affected and weakness during a hypotensive episdoe.    Urinary incontinence    Allergies  Allergen Reactions   Bee Venom Anaphylaxis    Other reaction(s): Anaphylaxis   Amoxicillin Dermatitis    Yeast infection Other reaction(s): Dermatitis/Yeast Infection   Past Surgical History:  Procedure Laterality Date   BACK SURGERY  02/2015   herniated disc L4/5/6   BREAST BIOPSY  1988   biopsy   catract Bilateral 2023   COLONOSCOPY     [place] Around 2015   ESOPHAGOGASTRODUODENOSCOPY  2016   [place]   LAPAROSCOPIC HYSTERECTOMY     partial    SHOULDER ARTHROSCOPY W/ LABRAL REPAIR Left 02/13/2016   left shoulder   Study: cognitive eval  06/23/2016   mild cog-linguistic impairment, difficulty with verbal fluency   Study: EEG   04/12/2015   Studies: EEG completed and normal.    Study: MRI  04/28/2016    Impression: numerous non-enhancing flair hyperintense lesions w/in the brain .Suspicious for demyelinating disease such as lyme or MS. Her Lyme test were negative.    Family History  Problem Relation Age of Onset   Lung cancer Mother    Alcohol  abuse Father    Throat cancer Father    Lung cancer Father    Depression Father    Heart attack Father    Breast cancer Sister    Alcohol abuse Brother    Diabetes Brother    Mental illness Sister    Stroke Daughter    Endometriosis Daughter    Heart disease Son    Breast cancer Maternal Aunt    Diabetes Maternal Grandmother    GER disease Daughter    Esophageal cancer Paternal Grandfather    Colon cancer Neg Hx    Rectal cancer Neg Hx    Stomach cancer Neg Hx    Social History   Socioeconomic History   Marital status: Single    Spouse name: Not on file   Number of children: 4   Years of education: two years of college   Highest education level: GED or equivalent  Occupational History   Occupation: retired  Tobacco Use   Smoking status: Former    Packs/day: 1.00    Years: 19.00    Total pack years: 19.00    Types: Cigarettes    Quit date: 2012    Years since quitting: 12.0   Smokeless tobacco: Never   Tobacco comments:    quit 6 years ago  Vaping Use   Vaping Use: Never used  Substance and Sexual Activity   Alcohol use: Yes    Alcohol/week: 1.0 standard drink of alcohol    Types: 1 Glasses of wine per week    Comment: twice a month   Drug use: Never   Sexual activity: Not Currently    Partners: Male  Other Topics Concern   Not on file  Social History Narrative   Single. Lived in [place] area, moved to [place] to be close to her daughters 2018. Lives with her daughter.   Some college, worked as Programmer, systems. Retired.    Social drinker. Former smoker.   Exercises routinely.    Wears dentures.    Drinks caffeine, takes a daily vitamin.   Smoke alarm in the home. Wears her seatbelt.    Feels safe in her relationships.    3-4 cups per day.   Four children (2 biological, 2 she raised as her own).   Social Determinants of Health   Financial Resource Strain: Low Risk  (09/01/2022)   Overall Financial Resource Strain (CARDIA)    Difficulty of Paying Living Expenses: Not hard at all  Food Insecurity: No Food Insecurity (09/01/2022)   Hunger Vital Sign    Worried About Running Out of Food in the Last Year: Never true    Ran Out of Food in the Last Year: Never true  Transportation Needs: No Transportation Needs (09/01/2022)   PRAPARE - Hydrologist (Medical): No    Lack of Transportation (Non-Medical): No  Physical Activity: Insufficiently Active (09/01/2022)   Exercise Vital Sign    Days of Exercise per Week: 1 day    Minutes of Exercise per Session: 50 min  Stress: No Stress Concern Present (09/01/2022)   [place]    Feeling of Stress : Not at all  Social Connections: Unknown (09/01/2022)   Social Connection and Isolation Panel [NHANES]    Frequency of Communication with Friends and Family: More than three times a week    Frequency of Social Gatherings with Friends and Family: Once a week    Attends Religious Services: Patient refused  Active Member of Clubs or Organizations: Yes    Attends Archivist Meetings: More than 4 times per year    Marital Status: Patient refused  Intimate Partner Violence: Not At Risk (09/01/2022)   Humiliation, Afraid, Rape, and Kick questionnaire    Fear of Current or Ex-Partner: No    Emotionally Abused: No    Physically Abused: No    Sexually Abused: No   Allergies as of 11/17/2022       Reactions   Bee Venom Anaphylaxis   Other reaction(s): Anaphylaxis   Amoxicillin Dermatitis   Yeast infection Other reaction(s): Dermatitis/Yeast Infection        Medication List        Accurate as of November 17, 2022  8:24 AM. If you have any questions, ask your  nurse or doctor.          amLODipine 10 MG tablet Commonly known as: NORVASC Take 1 tablet (10 mg total) by mouth daily.   aspirin EC 81 MG tablet Take 81 mg by mouth daily.   atorvastatin 40 MG tablet Commonly known as: LIPITOR Take 1 tablet (40 mg total) by mouth daily.   buPROPion 200 MG 12 hr tablet Commonly known as: Wellbutrin SR Take 1 tablet (200 mg total) by mouth 2 (two) times daily.   esomeprazole 40 MG capsule Commonly known as: NEXIUM TAKE ONE CAPSULE BY MOUTH ONE TIME DAILY AT NOON   famotidine 20 MG tablet Commonly known as: PEPCID Take 1 tablet (20 mg total) by mouth daily.   FLUoxetine 40 MG capsule Commonly known as: PROZAC Take 1 capsule (40 mg total) by mouth daily.   levothyroxine 25 MCG tablet Commonly known as: SYNTHROID Take 1 tablet (25 mcg total) by mouth daily before breakfast. On an empty stomach   lisinopril 40 MG tablet Commonly known as: ZESTRIL Take 1 tablet by mouth daily.   mirabegron ER 50 MG Tb24 tablet Commonly known as: Myrbetriq Take 1 tablet (50 mg total) by mouth daily.   Omega 3 1200 MG Caps   OVER THE COUNTER MEDICATION Neuro-Alpha 2 caps QD   TURMERIC PO Take by mouth.   Vitamin D 50 MCG (2000 UT) tablet        All past medical history, surgical history, allergies, family history, immunizations andmedications were updated in the EMR today and reviewed under the history and medication portions of their EMR.     No results found for this or any previous visit (from the past 2160 hour(s)).   Patient was never admitted.   ROS: 14 pt review of systems performed and negative (unless mentioned in an HPI)  Objective: BP 119/78   Pulse 69   Temp 98.3 F (36.8 C)   Wt 157 lb 3.2 oz (71.3 kg)   LMP 10/18/1990   SpO2 96%   BMI 28.75 kg/m  Physical Exam Vitals and nursing note reviewed.  Constitutional:      General: She is not in acute distress.    Appearance: Normal appearance. She is not  ill-appearing, toxic-appearing or diaphoretic.  HENT:     Head: Normocephalic and atraumatic.  Eyes:     General: No scleral icterus.       Right eye: No discharge.        Left eye: No discharge.     Extraocular Movements: Extraocular movements intact.     Conjunctiva/sclera: Conjunctivae normal.     Pupils: Pupils are equal, round, and reactive to light.  Cardiovascular:  Rate and Rhythm: Normal rate and regular rhythm.     Heart sounds: No murmur heard.    No gallop.  Pulmonary:     Effort: Pulmonary effort is normal. No respiratory distress.     Breath sounds: Normal breath sounds. No wheezing, rhonchi or rales.  Musculoskeletal:     Cervical back: Neck supple.     Right lower leg: No edema.     Left lower leg: No edema.  Lymphadenopathy:     Cervical: No cervical adenopathy.  Skin:    General: Skin is warm and dry.     Coloration: Skin is not jaundiced or pale.     Findings: No erythema or rash.  Neurological:     Mental Status: She is alert and oriented to person, place, and time. Mental status is at baseline.     Motor: No weakness.     Gait: Gait normal.  Psychiatric:        Mood and Affect: Mood normal.        Behavior: Behavior normal.        Thought Content: Thought content normal.        Judgment: Judgment normal.      Assessment/plan: Abigail Michael is a 72 y.o. female present for Chronic Conditions/illness Management Depression with anxiety Stable Continue fluoxetine 40 mg daily Continue Wellbutrin  200 mg twice a day - Referral to Myra Gianotti for therapy was placed > new referral placed today, this particular provider has since moved on and patient needs to establish as soon as possible with in-house provider. - Tried in the past: celexa and effexor.     Essential hypertension/morbid obesity/HLD Stable Continue amlodipine 10 mg daily to [facility]. New atorvastatin 40 mg daily, refills called into [facility]. continue ASA Labs due next visit.     Traumatic brain injury with loss of consciousness, sequela (HCC)/White matter disease/TIA//ataxia stable Aricept 10 mg qd> managed by neuro> stopped She worked with physical therapy and did really well with improving her gait. Patient is established with neurology.  overactive bladder Stable Continue  Myrbetriq 50 mg daily. >  Mail-in prescription Encounter for monitoring long-term proton pump inhibitor therapy/GERD Stable Continue  omeprazole. Continue Pepcid. Vitamin D, B12 and magnesium due next visit  Hypothyroidism due to acquired atrophy of thyroid Continue levo 25 mcg Lab due next visit  Stage 3a chronic kidney disease ([place]) stable Avoid nsaids when able.  Monitor yearly  Hypercalcemia Chronic.hydrate PTH and vitamin D UTD   No follow-ups on file.   No orders of the defined types were placed in this encounter.   No orders of the defined types were placed in this encounter.   Referral Orders  No referral(s) requested today      Note is dictated utilizing voice recognition software. Although note has been proof read prior to signing, occasional typographical errors still can be missed. If any questions arise, please do not hesitate to call for verification.  Electronically signed by: Howard Pouch, DO [place]

## 2022-12-08 ENCOUNTER — Ambulatory Visit (INDEPENDENT_AMBULATORY_CARE_PROVIDER_SITE_OTHER): Payer: Medicare Other | Admitting: Psychology

## 2022-12-08 DIAGNOSIS — F4323 Adjustment disorder with mixed anxiety and depressed mood: Secondary | ICD-10-CM | POA: Diagnosis not present

## 2022-12-08 NOTE — Progress Notes (Signed)
[facility] Counselor/Therapist Progress Note  Patient ID: Abigail Michael, MRN: [id number],    Date: 12/08/2022  Time Spent: 12:00pm-12:55pm  55 minutes   Treatment Type: Individual Therapy  Reported Symptoms: stress, worrying  Mental Status Exam: Appearance:  Casual     Behavior: Appropriate  Motor: Normal  Speech/Language:  Normal Rate  Affect: Appropriate  Mood: normal  Thought process: normal  Thought content:   WNL  Sensory/Perceptual disturbances:   WNL  Orientation: oriented to person, place, time/date, and situation  Attention: Good  Concentration: Good  Memory: WNL  Fund of knowledge:  Good  Insight:   Good  Judgment:  Good  Impulse Control: Good   Risk Assessment: Danger to Self:  No Self-injurious Behavior: No Danger to Others: No Duty to Warn:no Physical Aggression / Violence:No  Access to Firearms a concern: No  Gang Involvement:No   Subjective: Pt present for face-to-face individual therapy in person.   Pt talked about concerns about her family.   He sister has had some health problems.  Pt's sister is schizophrenic and lives in a group home.  Her sister has had some falls lately and had to have a hip replacement.  Pt has been worrying about her sister.   Pt's car died and she needs to shop for a new car.  It is stressful for her to have to make new car decisions.  Addressed pt's anxiety and helped her problem solve.  Pt has decided to renew her apartment lease for a year and a half.  She feels relieved to have that decision made. Pt talked about her social life.  She goes out a lot dancing.   She joined the [employer].  She has a good group of girl friends.   Worked on self care strategies.   Provided supportive therapy.  Interventions: Cognitive Behavioral Therapy and Insight-Oriented  Diagnosis: F43.23   Plan of Care:  Recommend ongoing therapy. Pt participated in setting treatment goals.   Plan to meet monthly.  Treatment Plan  (target date: 09/23/2023) Client Abilities/Strengths  Pt is bright, engaging, and motivated for therapy.  Client Treatment Preferences  Individual therapy.  Client Statement of Needs  Improve copings skills and Improve self esteem.  Symptoms  Depressed or irritable mood. Excessive and/or unrealistic worry that is difficult to control occurring more days than not for at least 6 months about a number of events or activities. Hypervigilance (e.g., feeling constantly on edge, experiencing concentration difficulties, having trouble falling or staying asleep, exhibiting a general state of irritability). Low self-esteem. Problems Addressed  Unipolar Depression, Anxiety Goals 1. Alleviate depressive symptoms and return to previous level of effective functioning. 2. Appropriately grieve the loss in order to normalize mood and to return to previously adaptive level of functioning. Objective Learn and implement behavioral strategies to overcome depression. Target Date: 2023-09-23 Frequency: Monthly  Progress: 50 Modality: individual  Related Interventions Assist the client in developing skills that increase the likelihood of deriving pleasure from behavioral activation (e.g., assertiveness skills, developing an exercise plan, less internal/more external focus, increased social involvement); reinforce success. Engage the client in "behavioral activation," increasing his/her activity level and contact with sources of reward, while identifying processes that inhibit activation. use behavioral techniques such as instruction, rehearsal, role-playing, role reversal, as needed, to facilitate activity in the client's daily life; reinforce success. 3. Develop healthy interpersonal relationships that lead to the alleviation and help prevent the relapse of depression. 4. Develop healthy thinking patterns and beliefs about self,  others, and the world that lead to the alleviation and help prevent the relapse of  depression. 5. Enhance ability to effectively cope with the full variety of life's worries and anxieties. 6. Learn and implement coping skills that result in a reduction of anxiety and worry, and improved daily functioning. Objective Learn and implement problem-solving strategies for realistically addressing worries. Target Date: 2023-09-23 Frequency: Monthly  Progress: 50 Modality: individual  Related Interventions Assign the client a homework exercise in which he/she problem-solves a current problem (see Mastery of Your Anxiety and Worry: Workbook by Adora Fridge and Eliot Ford or Generalized Anxiety Disorder by Eather Colas, and Eliot Ford); review, reinforce success, and provide corrective feedback toward improvement. Teach the client problem-solving strategies involving specifically defining a problem, generating options for addressing it, evaluating the pros and cons of each option, selecting and implementing an optional action, and reevaluating and refining the action. Objective Learn and implement calming skills to reduce overall anxiety and manage anxiety symptoms. Target Date: 2023-09-23 Frequency: Monthly  Progress: 50 Modality: individual  Related Interventions Assign the client to read about progressive muscle relaxation and other calming strategies in relevant books or treatment manuals (e.g., Progressive Relaxation Training by Leroy Kennedy; Mastery of Your Anxiety and Worry: Workbook by Beckie Busing). Assign the client homework each session in which he/she practices relaxation exercises daily, gradually applying them progressively from non-anxiety-provoking to anxiety-provoking situations; review and reinforce success while providing corrective feedback toward improvement. Teach the client calming/relaxation skills (e.g., applied relaxation, progressive muscle relaxation, cue controlled relaxation; mindful breathing; biofeedback) and how to discriminate better between relaxation  and tension; teach the client how to apply these skills to his/her daily life. 7. Recognize, accept, and cope with feelings of depression. 8. Reduce overall frequency, intensity, and duration of the anxiety so that daily functioning is not impaired. 9. Resolve the core conflict that is the source of anxiety. 10. Stabilize anxiety level while increasing ability to function on a daily basis. Diagnosis F43.23 Conditions For Discharge Achievement of treatment goals and objectives   Clint Bolder, LCSW

## 2023-01-05 ENCOUNTER — Ambulatory Visit (INDEPENDENT_AMBULATORY_CARE_PROVIDER_SITE_OTHER): Payer: Medicare Other | Admitting: Psychology

## 2023-01-05 DIAGNOSIS — F4323 Adjustment disorder with mixed anxiety and depressed mood: Secondary | ICD-10-CM

## 2023-01-05 NOTE — Progress Notes (Signed)
[facility] Counselor/Therapist Progress Note  Patient ID: Kerri-Lee Olesh, MRN: [id number],    Date: 01/05/2023  Time Spent: 10:00am-10:55am   55 minutes   Treatment Type: Individual Therapy  Reported Symptoms: stress, worrying  Mental Status Exam: Appearance:  Casual     Behavior: Appropriate  Motor: Normal  Speech/Language:  Normal Rate  Affect: Appropriate  Mood: normal  Thought process: normal  Thought content:   WNL  Sensory/Perceptual disturbances:   WNL  Orientation: oriented to person, place, time/date, and situation  Attention: Good  Concentration: Good  Memory: WNL  Fund of knowledge:  Good  Insight:   Good  Judgment:  Good  Impulse Control: Good   Risk Assessment: Danger to Self:  No Self-injurious Behavior: No Danger to Others: No Duty to Warn:no Physical Aggression / Violence:No  Access to Firearms a concern: No  Gang Involvement:No   Subjective: Pt present for face-to-face individual therapy in person.   Pt talked about her health.  She is in pain bc she fell at line dancing yesterday. Pt talked about buying a car.   She found a Honda CRV that she really likes.  She bought it and the transaction went very well. Pt made the decision to renew her lease in her apartment for a year.  She is relieved she does not have to worry about making a decision about buying a house for awhile.   Pt has been getting together with friends and going dancing.  Pt talked about worrying about her daughter who is going through a breakup.  Pt has been a big support to her.  Addressed pt's worrying and worked on thought reframing. Pt states she feels less anxious since she has made the decisions about a new car and her apartment lease.   Pt will be going to the [place] to [place] [employer] [place] 8 days in April.   Worked on self care strategies.   Provided supportive therapy.  Interventions: Cognitive Behavioral Therapy and Insight-Oriented  Diagnosis: F43.23    Plan of Care:  Recommend ongoing therapy. Pt participated in setting treatment goals.   Plan to meet monthly.  Treatment Plan (target date: 09/23/2023) Client Abilities/Strengths  Pt is bright, engaging, and motivated for therapy.  Client Treatment Preferences  Individual therapy.  Client Statement of Needs  Improve copings skills and Improve self esteem.  Symptoms  Depressed or irritable mood. Excessive and/or unrealistic worry that is difficult to control occurring more days than not for at least 6 months about a number of events or activities. Hypervigilance (e.g., feeling constantly on edge, experiencing concentration difficulties, having trouble falling or staying asleep, exhibiting a general state of irritability). Low self-esteem. Problems Addressed  Unipolar Depression, Anxiety Goals 1. Alleviate depressive symptoms and return to previous level of effective functioning. 2. Appropriately grieve the loss in order to normalize mood and to return to previously adaptive level of functioning. Objective Learn and implement behavioral strategies to overcome depression. Target Date: 2023-09-23 Frequency: Monthly  Progress: 50 Modality: individual  Related Interventions Assist the client in developing skills that increase the likelihood of deriving pleasure from behavioral activation (e.g., assertiveness skills, developing an exercise plan, less internal/more external focus, increased social involvement); reinforce success. Engage the client in "behavioral activation," increasing his/her activity level and contact with sources of reward, while identifying processes that inhibit activation. use behavioral techniques such as instruction, rehearsal, role-playing, role reversal, as needed, to facilitate activity in the client's daily life; reinforce success. 3. Develop healthy interpersonal  relationships that lead to the alleviation and help prevent the relapse of depression. 4. Develop healthy  thinking patterns and beliefs about self, others, and the world that lead to the alleviation and help prevent the relapse of depression. 5. Enhance ability to effectively cope with the full variety of life's worries and anxieties. 6. Learn and implement coping skills that result in a reduction of anxiety and worry, and improved daily functioning. Objective Learn and implement problem-solving strategies for realistically addressing worries. Target Date: 2023-09-23 Frequency: Monthly  Progress: 50 Modality: individual  Related Interventions Assign the client a homework exercise in which he/she problem-solves a current problem (see Mastery of Your Anxiety and Worry: Workbook by Adora Fridge and Eliot Ford or Generalized Anxiety Disorder by Eather Colas, and Eliot Ford); review, reinforce success, and provide corrective feedback toward improvement. Teach the client problem-solving strategies involving specifically defining a problem, generating options for addressing it, evaluating the pros and cons of each option, selecting and implementing an optional action, and reevaluating and refining the action. Objective Learn and implement calming skills to reduce overall anxiety and manage anxiety symptoms. Target Date: 2023-09-23 Frequency: Monthly  Progress: 50 Modality: individual  Related Interventions Assign the client to read about progressive muscle relaxation and other calming strategies in relevant books or treatment manuals (e.g., Progressive Relaxation Training by Leroy Kennedy; Mastery of Your Anxiety and Worry: Workbook by Beckie Busing). Assign the client homework each session in which he/she practices relaxation exercises daily, gradually applying them progressively from non-anxiety-provoking to anxiety-provoking situations; review and reinforce success while providing corrective feedback toward improvement. Teach the client calming/relaxation skills (e.g., applied relaxation, progressive  muscle relaxation, cue controlled relaxation; mindful breathing; biofeedback) and how to discriminate better between relaxation and tension; teach the client how to apply these skills to his/her daily life. 7. Recognize, accept, and cope with feelings of depression. 8. Reduce overall frequency, intensity, and duration of the anxiety so that daily functioning is not impaired. 9. Resolve the core conflict that is the source of anxiety. 10. Stabilize anxiety level while increasing ability to function on a daily basis. Diagnosis F43.23 Conditions For Discharge Achievement of treatment goals and objectives   Clint Bolder, LCSW                 Delaila Nand Arbyrd, LCSW

## 2023-02-23 ENCOUNTER — Ambulatory Visit: Payer: Medicare Other | Admitting: Psychology

## 2023-03-08 ENCOUNTER — Ambulatory Visit (INDEPENDENT_AMBULATORY_CARE_PROVIDER_SITE_OTHER): Payer: Medicare Other | Admitting: Family Medicine

## 2023-03-08 ENCOUNTER — Encounter: Payer: Self-pay | Admitting: Family Medicine

## 2023-03-08 VITALS — BP 132/84 | HR 68 | Wt 157.0 lb

## 2023-03-08 DIAGNOSIS — F330 Major depressive disorder, recurrent, mild: Secondary | ICD-10-CM | POA: Diagnosis not present

## 2023-03-08 DIAGNOSIS — F419 Anxiety disorder, unspecified: Secondary | ICD-10-CM | POA: Diagnosis not present

## 2023-03-08 MED ORDER — FLUOXETINE HCL 40 MG PO CAPS: 40.0000 mg | ORAL_CAPSULE | Freq: Every day | ORAL | 1 refills | Status: DC

## 2023-03-08 MED ORDER — BUPROPION HCL ER (SR) 200 MG PO TB12: 200.0000 mg | ORAL_TABLET | Freq: Two times a day (BID) | ORAL | 1 refills | Status: DC

## 2023-03-08 NOTE — Progress Notes (Signed)
Patient ID: Abigail Michael, female  DOB: 10-17-1951, 72 y.o.   MRN: [id number] Patient Care Team    Relationship Specialty Notifications Start End  Natalia Leatherwood, DO PCP - General Family Medicine  01/16/18   [name], MD Referring Physician Neurology  08/28/18   [name]  Dentistry  07/17/19   [name]  Orthodontics  07/17/19   [name] R  Oral Surgery  07/17/19   [name], MD Referring Physician Psychiatry  07/17/19    Comment: Neurology  [name], MD Consulting Physician Ophthalmology  12/16/21    Comment: cataract    Chief Complaint  Patient presents with   Anxiety    Subjective: Abigail Michael is a 72 y.o.  female present for follow up cmc Depression with anxiety Patient reports she is experiencing more anxiety and feeling overwhelmed last few months on her Wellbutrin and prozac.  Wellbutrin was increased last visit to 200 mg BID. She reports she did not realize it was BID and was only taking qd.   Prior note:  She is still in a group of online people within her age bracket they get together and go out to eat, go bowling etc. she now has her own apartment and is enjoying decorating it.  She reports she has found some new friends and they go out and dance a few times a week.   She has been referred to a therapist.       03/08/2023    1:40 PM 11/17/2022    8:17 AM 09/01/2022    2:56 PM 06/02/2022   10:06 AM 12/16/2021   10:33 AM  Depression screen PHQ 2/9  Decreased Interest 1 0 0 0 0  Down, Depressed, Hopeless 1 0 0 0 0  PHQ - 2 Score 2 0 0 0 0  Altered sleeping 1 0  0 0  Tired, decreased energy 1 1  0 0  Change in appetite 0 0  0 0  Feeling bad or failure about yourself  1 0  0 0  Trouble concentrating 1 0  0 0  Moving slowly or fidgety/restless 1 0  0 0  Suicidal thoughts 0 0  0 0  PHQ-9 Score 7 1  0 0  Difficult doing work/chores Not difficult at all   Not difficult at all       03/08/2023    1:41 PM 11/17/2022    8:17 AM 06/02/2022    10:06 AM 12/16/2021   10:34 AM  GAD 7 : Generalized Anxiety Score  Nervous, Anxious, on Edge 1 0 0 0  Control/stop worrying 2 0 0 0  Worry too much - different things 2  0 0  Trouble relaxing 2 0 0 0  Restless 1 0 0 0  Easily annoyed or irritable 2 0 0 0  Afraid - awful might happen 1 0 0 0  Total GAD 7 Score 11  0 0  Anxiety Difficulty Somewhat difficult  Not difficult at all           03/08/2023    1:40 PM 11/17/2022    8:17 AM 09/01/2022    2:57 PM 12/16/2021   10:26 AM 12/12/2021    9:15 AM  Fall Risk   Falls in the past year? 1 1 1  0 1  Number falls in past yr:  0 0 0 1  Injury with Fall? 0 0 0 0 0  Risk for fall due to :   No Fall Risks  No Fall Risks   Follow up  Falls evaluation completed Falls evaluation completed Falls evaluation completed      Immunization History  Administered Date(s) Administered   Fluad Quad(high Dose 65+) 09/16/2020, 07/03/2021   Influenza Split 07/26/2012   Influenza, High Dose Seasonal PF 08/28/2018, 06/13/2019   Influenza, Seasonal, Injecte, Preservative Fre 07/24/2010   PFIZER(Purple Top)SARS-COV-2 Vaccination 11/09/2019, 12/03/2019, 09/16/2020   Pneumococcal Conjugate-13 12/02/2020   Pneumococcal Polysaccharide-23 08/28/2018   Td (Adult),unspecified 10/18/1998   Tdap 02/06/2007   Zoster Recombinat (Shingrix) 05/10/2019, 09/10/2019   Zoster, Live 06/16/2011    No results found.  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 many MRI w/ positive white matter abnormality. MRI reported stable since 2015. mild progressive  Cognitive loss   Obstructive sleep apnea    on  prior neuro notes 10/2016; no results.    Osteoporosis    had been on reclast   Post concussion syndrome 10/2013   ongoing issues with concentration, memory loss, lability; EEG completed and normal.    TBI (traumatic brain injury) (HCC) 2014   fall at [facility], hit head off of floor. No bleed, "concussion". Seen neurology since for focus and word finding.    TIA (transient ischemic attack) 11/30/2016   MRI normal. No lateralization. Speech affected and weakness during a hypotensive episdoe.    Urinary incontinence    Allergies  Allergen Reactions   Bee Venom Anaphylaxis    Other reaction(s): Anaphylaxis   Amoxicillin Dermatitis    Yeast infection Other reaction(s): Dermatitis/Yeast Infection   Past Surgical History:  Procedure Laterality Date   BACK SURGERY  02/2015   herniated disc L4/5/6   BREAST BIOPSY  1988   biopsy   catract Bilateral 2023   COLONOSCOPY     [place] Around 2015   ESOPHAGO[place]ODUODENOSCOPY  2016   [place]   LAPAROSCOPIC HYSTERECTOMY     partial    SHOULDER ARTHROSCOPY W/ LABRAL REPAIR Left 02/13/2016   left shoulder   Study: cognitive eval  06/23/2016   mild cog-linguistic impairment, difficulty with verbal fluency   Study: EEG   04/12/2015   Studies: EEG completed and normal.    Study: MRI  04/28/2016   Impression: numerous non-enhancing flair hyperintense lesions w/in the brain .Suspicious for demyelinating disease such as lyme or MS. Her Lyme test were negative.    Family History  Problem Relation Age of Onset   Lung cancer Mother    Alcohol abuse Father    Throat cancer Father    Lung cancer Father    Depression Father    Heart attack Father    Breast cancer Sister    Alcohol abuse Brother    Diabetes Brother    Mental illness Sister    Stroke Daughter    Endometriosis Daughter    Heart disease Son    Breast cancer Maternal Aunt    Diabetes Maternal Grandmother    GER disease Daughter    Esophageal cancer Paternal  Grandfather    Colon cancer  Neg Hx    Rectal cancer Neg Hx    Stomach cancer Neg Hx    Social History   Socioeconomic History   Marital status: Single    Spouse name: Not on file   Number of children: 4   Years of education: two years of college   Highest education level: GED or equivalent  Occupational History   Occupation: retired  Tobacco Use   Smoking status: Former    Packs/day: 1.00    Years: 19.00    Additional pack years: 0.00    Total pack years: 19.00    Types: Cigarettes    Quit date: 2012    Years since quitting: 12.3   Smokeless tobacco: Never   Tobacco comments:    quit 6 years ago  Vaping Use   Vaping Use: Never used  Substance and Sexual Activity   Alcohol use: Yes    Alcohol/week: 1.0 standard drink of alcohol    Types: 1 Glasses of wine per week    Comment: twice a month   Drug use: Never   Sexual activity: Not Currently    Partners: Male  Other Topics Concern   Not on file  Social History Narrative   Single. Lived in [place] area, moved to [place] to be close to her daughters 2018. Lives with her daughter.   Some college, worked as Sports coach. Retired.    Social drinker. Former smoker.   Exercises routinely.    Wears dentures.    Drinks caffeine, takes a daily vitamin.   Smoke alarm in the home. Wears her seatbelt.    Feels safe in her relationships.   3-4 cups per day.   Four children (2 biological, 2 she raised as her own).   Social Determinants of Health   Financial Resource Strain: Low Risk  (09/01/2022)   Overall Financial Resource Strain (CARDIA)    Difficulty of Paying Living Expenses: Not hard at all  Food Insecurity: No Food Insecurity (09/01/2022)   Hunger Vital Sign    Worried About Running Out of Food in the Last Year: Never true    Ran Out of Food in the Last Year: Never true  Transportation Needs: No Transportation Needs (09/01/2022)   PRAPARE - Administrator, Civil Service (Medical): No    Lack of Transportation  (Non-Medical): No  Physical Activity: Insufficiently Active (09/01/2022)   Exercise Vital Sign    Days of Exercise per Week: 1 day    Minutes of Exercise per Session: 50 min  Stress: No Stress Concern Present (09/01/2022)   [employer] of Occupational Health - Occupational Stress Questionnaire    Feeling of Stress : Not at all  Social Connections: Unknown (09/01/2022)   Social Connection and Isolation Panel [NHANES]    Frequency of Communication with Friends and Family: More than three times a week    Frequency of Social Gatherings with Friends and Family: Once a week    Attends Religious Services: Patient declined    Database administrator or Organizations: Yes    Attends Engineer, structural: More than 4 times per year    Marital Status: Patient declined  Intimate Partner Violence: Not At Risk (09/01/2022)   Humiliation, Afraid, Rape, and Kick questionnaire    Fear of Current or Ex-Partner: No    Emotionally Abused: No    Physically Abused: No    Sexually Abused: No   Allergies as of 03/08/2023       Reactions  Bee Venom Anaphylaxis   Other reaction(s): Anaphylaxis   Amoxicillin Dermatitis   Yeast infection Other reaction(s): Dermatitis/Yeast Infection        Medication List        Accurate as of Mar 08, 2023  1:57 PM. If you have any questions, ask your nurse or doctor.          amLODipine 10 MG tablet Commonly known as: NORVASC Take 1 tablet (10 mg total) by mouth daily.   aspirin EC 81 MG tablet Take 81 mg by mouth daily.   atorvastatin 40 MG tablet Commonly known as: LIPITOR Take 1 tablet (40 mg total) by mouth daily.   buPROPion 200 MG 12 hr tablet Commonly known as: Wellbutrin SR Take 1 tablet (200 mg total) by mouth 2 (two) times daily.   esomeprazole 40 MG capsule Commonly known as: NEXIUM TAKE ONE CAPSULE BY MOUTH ONE TIME DAILY AT NOON   famotidine 20 MG tablet Commonly known as: PEPCID Take 1 tablet (20 mg total) by mouth  daily.   FLUoxetine 40 MG capsule Commonly known as: PROZAC Take 1 capsule (40 mg total) by mouth daily.   levothyroxine 25 MCG tablet Commonly known as: SYNTHROID Take 1 tablet (25 mcg total) by mouth daily before breakfast. On an empty stomach   mirabegron ER 50 MG Tb24 tablet Commonly known as: Myrbetriq Take 1 tablet (50 mg total) by mouth daily.   Omega 3 1200 MG Caps   TURMERIC PO Take by mouth.   Vitamin D 50 MCG (2000 UT) tablet        All past medical history, surgical history, allergies, family history, immunizations andmedications were updated in the EMR today and reviewed under the history and medication portions of their EMR.     No results found for this or any previous visit (from the past 2160 hour(s)).   Patient was never admitted.   ROS: 14 pt review of systems performed and negative (unless mentioned in an HPI)  Objective: BP 132/84   Pulse 68   Wt 157 lb (71.2 kg)   LMP 10/18/1990   SpO2 97%   BMI 28.72 kg/m  Physical Exam Vitals and nursing note reviewed.  Constitutional:      General: She is not in acute distress.    Appearance: Normal appearance. She is normal weight. She is not ill-appearing or toxic-appearing.  HENT:     Head: Normocephalic and atraumatic.  Eyes:     General: No scleral icterus.       Right eye: No discharge.        Left eye: No discharge.     Extraocular Movements: Extraocular movements intact.     Conjunctiva/sclera: Conjunctivae normal.     Pupils: Pupils are equal, round, and reactive to light.  Skin:    Findings: No rash.  Neurological:     Mental Status: She is alert and oriented to person, place, and time. Mental status is at baseline.     Motor: No weakness.     Coordination: Coordination normal.     Gait: Gait normal.  Psychiatric:        Mood and Affect: Mood normal.        Behavior: Behavior normal.        Thought Content: Thought content normal.        Judgment: Judgment normal.       Assessment/plan: Aviona Clute is a 72 y.o. female present for worsening anxiety Management Depression with anxiety Worsening anxiety Continue  fluoxetine 40 mg cap increase Wellbutrin  200 mg  - was only accidentally only taking qd instead of Bid> increase to BID. - Referral has been placed last visit for replacement,since her provider is no longer practicing.  - Tried in the past: celexa and effexor.  Has appt in July for [facility]  Return for has appt in July.   No orders of the defined types were placed in this encounter.   Meds ordered this encounter  Medications   buPROPion (WELLBUTRIN SR) 200 MG 12 hr tablet    Sig: Take 1 tablet (200 mg total) by mouth 2 (two) times daily.    Dispense:  180 tablet    Refill:  1   FLUoxetine (PROZAC) 40 MG capsule    Sig: Take 1 capsule (40 mg total) by mouth daily.    Dispense:  90 capsule    Refill:  1    Referral Orders  No referral(s) requested today      Note is dictated utilizing voice recognition software. Although note has been proof read prior to signing, occasional typographical errors still can be missed. If any questions arise, please do not hesitate to call for verification.  Electronically signed by: Felix Pacini, DO [facility]

## 2023-03-08 NOTE — Patient Instructions (Addendum)
Return for has appt in July.        Great to see you today.  I have refilled the medication(s) we provide.   If labs were collected, we will inform you of lab results once received either by echart message or telephone call.   - echart message- for normal results that have been seen by the patient already.   - telephone call: abnormal results or if patient has not viewed results in their echart.

## 2023-03-30 ENCOUNTER — Ambulatory Visit (INDEPENDENT_AMBULATORY_CARE_PROVIDER_SITE_OTHER): Payer: Medicare Other | Admitting: Psychology

## 2023-03-30 DIAGNOSIS — F4323 Adjustment disorder with mixed anxiety and depressed mood: Secondary | ICD-10-CM

## 2023-03-30 NOTE — Progress Notes (Signed)
[facility] Counselor/Therapist Progress Note  Patient ID: Abigail Michael, MRN: [id number],    Date: 03/30/2023  Time Spent: 11:00am-11:55am   55 minutes   Treatment Type: Individual Therapy  Reported Symptoms: stress, worrying  Mental Status Exam: Appearance:  Casual     Behavior: Appropriate  Motor: Normal  Speech/Language:  Normal Rate  Affect: Appropriate  Mood: normal  Thought process: normal  Thought content:   WNL  Sensory/Perceptual disturbances:   WNL  Orientation: oriented to person, place, time/date, and situation  Attention: Good  Concentration: Good  Memory: WNL  Fund of knowledge:  Good  Insight:   Good  Judgment:  Good  Impulse Control: Good   Risk Assessment: Danger to Self:  No Self-injurious Behavior: No Danger to Others: No Duty to Warn:no Physical Aggression / Violence:No  Access to Firearms a concern: No  Gang Involvement:No   Subjective: Pt present for face-to-face individual therapy in person.   Pt talked about feeling anxious lately.  There is a lot going on in her life with her kids.  Pt's daughter broke up with her fiance which was very upsetting for her.  Pt has been worrying about her daughter.  Pt's daughter has been depressed.   Addressed pt's anxiety.  She saw the doctor and found out she wasn't taking her medication right.  She has started taking it right and has been feeling better.   Pt takes Prozac once a day and Wellbutrin twice a day.   Pt tends to worry about the future.  Pt tends to be hard on herself.  Pt is trying to learn that she can't fix other people's problems.  Worked on healthy boundary setting.  Worked on worry management.  Pt continues to get together with friends and goes out dancing.   Worked on self care strategies.   Provided supportive therapy.  Interventions: Cognitive Behavioral Therapy and Insight-Oriented  Diagnosis: F43.23   Plan of Care:  Recommend ongoing therapy. Pt participated in setting  treatment goals.   Plan to meet monthly.  Treatment Plan (target date: 09/23/2023) Client Abilities/Strengths  Pt is bright, engaging, and motivated for therapy.  Client Treatment Preferences  Individual therapy.  Client Statement of Needs  Improve copings skills and Improve self esteem.  Symptoms  Depressed or irritable mood. Excessive and/or unrealistic worry that is difficult to control occurring more days than not for at least 6 months about a number of events or activities. Hypervigilance (e.g., feeling constantly on edge, experiencing concentration difficulties, having trouble falling or staying asleep, exhibiting a general state of irritability). Low self-esteem. Problems Addressed  Unipolar Depression, Anxiety Goals 1. Alleviate depressive symptoms and return to previous level of effective functioning. 2. Appropriately grieve the loss in order to normalize mood and to return to previously adaptive level of functioning. Objective Learn and implement behavioral strategies to overcome depression. Target Date: 2023-09-23 Frequency: Monthly  Progress: 50 Modality: individual  Related Interventions Assist the client in developing skills that increase the likelihood of deriving pleasure from behavioral activation (e.g., assertiveness skills, developing an exercise plan, less internal/more external focus, increased social involvement); reinforce success. Engage the client in "behavioral activation," increasing his/her activity level and contact with sources of reward, while identifying processes that inhibit activation. use behavioral techniques such as instruction, rehearsal, role-playing, role reversal, as needed, to facilitate activity in the client's daily life; reinforce success. 3. Develop healthy interpersonal relationships that lead to the alleviation and help prevent the relapse of depression. 4. Develop healthy  thinking patterns and beliefs about self, others, and the world that lead  to the alleviation and help prevent the relapse of depression. 5. Enhance ability to effectively cope with the full variety of life's worries and anxieties. 6. Learn and implement coping skills that result in a reduction of anxiety and worry, and improved daily functioning. Objective Learn and implement problem-solving strategies for realistically addressing worries. Target Date: 2023-09-23 Frequency: Monthly  Progress: 50 Modality: individual  Related Interventions Assign the client a homework exercise in which he/she problem-solves a current problem (see Mastery of Your Anxiety and Worry: Workbook by Elenora Fender and Filbert Schilder or Generalized Anxiety Disorder by Elesa Hacker, and Filbert Schilder); review, reinforce success, and provide corrective feedback toward improvement. Teach the client problem-solving strategies involving specifically defining a problem, generating options for addressing it, evaluating the pros and cons of each option, selecting and implementing an optional action, and reevaluating and refining the action. Objective Learn and implement calming skills to reduce overall anxiety and manage anxiety symptoms. Target Date: 2023-09-23 Frequency: Monthly  Progress: 50 Modality: individual  Related Interventions Assign the client to read about progressive muscle relaxation and other calming strategies in relevant books or treatment manuals (e.g., Progressive Relaxation Training by Twana First; Mastery of Your Anxiety and Worry: Workbook by Earlie Counts). Assign the client homework each session in which he/she practices relaxation exercises daily, gradually applying them progressively from non-anxiety-provoking to anxiety-provoking situations; review and reinforce success while providing corrective feedback toward improvement. Teach the client calming/relaxation skills (e.g., applied relaxation, progressive muscle relaxation, cue controlled relaxation; mindful breathing; biofeedback)  and how to discriminate better between relaxation and tension; teach the client how to apply these skills to his/her daily life. 7. Recognize, accept, and cope with feelings of depression. 8. Reduce overall frequency, intensity, and duration of the anxiety so that daily functioning is not impaired. 9. Resolve the core conflict that is the source of anxiety. 10. Stabilize anxiety level while increasing ability to function on a daily basis. Diagnosis F43.23 Conditions For Discharge Achievement of treatment goals and objectives   Salomon Fick, LCSW

## 2023-05-04 ENCOUNTER — Ambulatory Visit: Payer: Medicare Other | Admitting: Family Medicine

## 2023-05-04 VITALS — BP 140/79 | HR 69 | Temp 98.2°F | Wt 156.2 lb

## 2023-05-04 DIAGNOSIS — Z5181 Encounter for therapeutic drug level monitoring: Secondary | ICD-10-CM

## 2023-05-04 DIAGNOSIS — S069X9S Unspecified intracranial injury with loss of consciousness of unspecified duration, sequela: Secondary | ICD-10-CM | POA: Diagnosis not present

## 2023-05-04 DIAGNOSIS — N1831 Chronic kidney disease, stage 3a: Secondary | ICD-10-CM | POA: Diagnosis not present

## 2023-05-04 DIAGNOSIS — E210 Primary hyperparathyroidism: Secondary | ICD-10-CM

## 2023-05-04 DIAGNOSIS — I10 Essential (primary) hypertension: Secondary | ICD-10-CM | POA: Diagnosis not present

## 2023-05-04 DIAGNOSIS — G3281 Cerebellar ataxia in diseases classified elsewhere: Secondary | ICD-10-CM | POA: Diagnosis not present

## 2023-05-04 DIAGNOSIS — F330 Major depressive disorder, recurrent, mild: Secondary | ICD-10-CM

## 2023-05-04 DIAGNOSIS — F419 Anxiety disorder, unspecified: Secondary | ICD-10-CM

## 2023-05-04 DIAGNOSIS — D126 Benign neoplasm of colon, unspecified: Secondary | ICD-10-CM | POA: Diagnosis not present

## 2023-05-04 DIAGNOSIS — K219 Gastro-esophageal reflux disease without esophagitis: Secondary | ICD-10-CM | POA: Diagnosis not present

## 2023-05-04 DIAGNOSIS — N3281 Overactive bladder: Secondary | ICD-10-CM | POA: Diagnosis not present

## 2023-05-04 DIAGNOSIS — E782 Mixed hyperlipidemia: Secondary | ICD-10-CM | POA: Diagnosis not present

## 2023-05-04 DIAGNOSIS — M810 Age-related osteoporosis without current pathological fracture: Secondary | ICD-10-CM

## 2023-05-04 LAB — COMPREHENSIVE METABOLIC PANEL
ALT: 18 U/L (ref 0–35)
AST: 14 U/L (ref 0–37)
Albumin: 4.5 g/dL (ref 3.5–5.2)
Alkaline Phosphatase: 90 U/L (ref 39–117)
BUN: 17 mg/dL (ref 6–23)
CO2: 28 mEq/L (ref 19–32)
Calcium: 10.6 mg/dL — ABNORMAL HIGH (ref 8.4–10.5)
Chloride: 105 mEq/L (ref 96–112)
Creatinine, Ser: 1 mg/dL (ref 0.40–1.20)
GFR: 56.27 mL/min — ABNORMAL LOW (ref >60.00)
Glucose, Bld: 99 mg/dL (ref 70–99)
Potassium: 4.2 mEq/L (ref 3.5–5.1)
Sodium: 142 mEq/L (ref 135–145)
Total Bilirubin: 0.4 mg/dL (ref 0.2–1.2)
Total Protein: 6.5 g/dL (ref 6.0–8.3)

## 2023-05-04 LAB — CBC
HCT: 42.7 % (ref 36.0–46.0)
Hemoglobin: 14.3 g/dL (ref 12.0–15.0)
MCHC: 33.6 g/dL (ref 30.0–36.0)
MCV: 93.1 fl (ref 78.0–100.0)
Platelets: 249 10*3/uL (ref 150.0–400.0)
RBC: 4.58 Mil/uL (ref 3.87–5.11)
RDW: 13.3 % (ref 11.5–15.5)
WBC: 5 10*3/uL (ref 4.0–10.5)

## 2023-05-04 LAB — B12 AND FOLATE PANEL
Folate: 5.2 ng/mL — ABNORMAL LOW (ref >5.9)
Vitamin B-12: >1501 pg/mL — ABNORMAL HIGH (ref 211–911)

## 2023-05-04 LAB — MAGNESIUM: Magnesium: 1.9 mg/dL (ref 1.5–2.5)

## 2023-05-04 LAB — LIPID PANEL
Cholesterol: 204 mg/dL — ABNORMAL HIGH (ref 0–200)
HDL: 119.5 mg/dL (ref >39.00)
LDL Cholesterol: 73 mg/dL (ref 0–99)
NonHDL: 84.89
Total CHOL/HDL Ratio: 2
Triglycerides: 60 mg/dL (ref 0.0–149.0)
VLDL: 12 mg/dL (ref 0.0–40.0)

## 2023-05-04 LAB — VITAMIN D 25 HYDROXY (VIT D DEFICIENCY, FRACTURES): VITD: 68.14 ng/mL (ref 30.00–100.00)

## 2023-05-04 LAB — TSH: TSH: 3.61 u[IU]/mL (ref 0.35–5.50)

## 2023-05-04 MED ORDER — MIRABEGRON ER 50 MG PO TB24: 50.0000 mg | ORAL_TABLET | Freq: Every day | ORAL | 3 refills | Status: DC

## 2023-05-04 MED ORDER — FLUOXETINE HCL 40 MG PO CAPS: 40.0000 mg | ORAL_CAPSULE | Freq: Every day | ORAL | 1 refills | Status: DC

## 2023-05-04 MED ORDER — BUPROPION HCL ER (SR) 200 MG PO TB12: 200.0000 mg | ORAL_TABLET | Freq: Two times a day (BID) | ORAL | 1 refills | Status: DC

## 2023-05-04 MED ORDER — FAMOTIDINE 20 MG PO TABS: 20.0000 mg | ORAL_TABLET | Freq: Every day | ORAL | 1 refills | Status: DC

## 2023-05-04 MED ORDER — ALBUTEROL SULFATE HFA 108 (90 BASE) MCG/ACT IN AERS: 2.0000 | INHALATION_SPRAY | Freq: Four times a day (QID) | RESPIRATORY_TRACT | 0 refills | Status: DC | PRN

## 2023-05-04 MED ORDER — ESOMEPRAZOLE MAGNESIUM 40 MG PO CPDR: DELAYED_RELEASE_CAPSULE | ORAL | 1 refills | Status: DC

## 2023-05-04 MED ORDER — TETANUS-DIPHTH-ACELL PERTUSSIS 5-2.5-18.5 LF-MCG/0.5 IM SUSP: 0.5000 mL | Freq: Once | INTRAMUSCULAR | 0 refills | Status: DC

## 2023-05-04 MED ORDER — LISINOPRIL 5 MG PO TABS: 2.5000 mg | ORAL_TABLET | Freq: Every day | ORAL | 1 refills | Status: DC

## 2023-05-04 MED ORDER — AMLODIPINE BESYLATE 10 MG PO TABS: 10.0000 mg | ORAL_TABLET | Freq: Every day | ORAL | 1 refills | Status: DC

## 2023-05-04 MED ORDER — ATORVASTATIN CALCIUM 40 MG PO TABS: 40.0000 mg | ORAL_TABLET | Freq: Every day | ORAL | 3 refills | Status: DC

## 2023-05-04 NOTE — Patient Instructions (Addendum)
Colonoscopy is overdue since last November.  Please call Dr. Frankey Shown office to schedule a your earliest convenience, if you have not done so already. [facility] Address:  [address] [place], [place], [place] [address] Phone: [phone]   Return in about 24 weeks (around 10/19/2023) for Routine chronic condition follow-up.        Great to see you today.  I have refilled the medication(s) we provide.   If labs were collected, we will inform you of lab results once received either by echart message or telephone call.   - echart message- for normal results that have been seen by the patient already.   - telephone call: abnormal results or if patient has not viewed results in their echart.

## 2023-05-04 NOTE — Progress Notes (Signed)
Patient ID: Abigail Michael, female  DOB: December 02, 1950, 72 y.o.   MRN: [id number] Patient Care Team    Relationship Specialty Notifications Start End  Natalia Leatherwood, DO PCP - General Family Medicine  01/16/18   [name], MD Referring Physician Neurology  08/28/18   [name]  Dentistry  07/17/19   [name]  Orthodontics  07/17/19   [name] R  Oral Surgery  07/17/19   [name], MD Referring Physician Psychiatry  07/17/19    Comment: Neurology  [name], MD Consulting Physician Ophthalmology  12/16/21    Comment: cataract    Chief Complaint  Patient presents with   Hypertension    Follow up on BP. She states she has been sick a few months ago and since has been having SOB. She would like a referral for colonoscopy and also a prescription for tdap.    Subjective: Abigail Michael is a 72 y.o.  female present for follow up cmc Depression with anxiety Patient reports she is experiencing more anxiety and feeling overwhelmed last few months on her Wellbutrin and prozac.  Wellbutrin was increased last visit to 200 mg BID. She reports she did not realize it was BID and was only taking qd.  Prior note: Patient reports she is feeling good on Wellbutrin and prozac.    She is still in a group of online people within her age bracket they get together and go out to eat, go bowling etc. she now has her own apartment and is enjoying decorating it.  She reports she has found some new friends and they go out and dance a few times a week.   She has been referred to a therapist.   Essential hypertension/HLD/morbid obesity/h/o TIA: Pt reports compliance with amlodipine 10 mg QD. Patient denies chest pain, shortness of breath, dizziness or lower extremity edema.  Pt takes a  daily baby ASA. Pt is  prescribed statin. H/O TIA.  Diet: Low sodium Exercise: Patient reports she has become more active getting out walking more routinely and feeling better. RF: HTN, HLD, Obesity, h/o CVA,  FHX MI   GERD/PPI:  Patient reports compliance with Nexium and Pepcid.    Marland Kitchen   Hypothyroidism:  Patient reports compliance with 25 mcg levothyroxine daily.     Overactive bladder/MS-white matter disease/TIA/TBI/ataxia: Patient reports compliance with her Myrbetriq and it is working well or her.  She had been prescribed Aricept 10 mg nightly, she has discontinued that medication.   RAD: Pt reports she feels winded with dancing and exercising.This is nre for her, she has been able to perform these activities without feeling winded until she recently had covid. Otherwise she feels well. No chest pain associated.      05/04/2023    9:49 AM 03/08/2023    1:40 PM 11/17/2022    8:17 AM 09/01/2022    2:56 PM 06/02/2022   10:06 AM  Depression screen PHQ 2/9  Decreased Interest 0 1 0 0 0  Down, Depressed, Hopeless 0 1 0 0 0  PHQ - 2 Score 0 2 0 0 0  Altered sleeping  1 0  0  Tired, decreased energy  1 1  0  Change in appetite  0 0  0  Feeling bad or failure about yourself   1 0  0  Trouble concentrating  1 0  0  Moving slowly or fidgety/restless  1 0  0  Suicidal thoughts  0 0  0  PHQ-9 Score  7 1  0  Difficult doing work/chores  Not difficult at all   Not difficult at all      05/04/2023    9:49 AM 03/08/2023    1:41 PM 11/17/2022    8:17 AM 06/02/2022   10:06 AM  GAD 7 : Generalized Anxiety Score  Nervous, Anxious, on Edge 0 1 0 0  Control/stop worrying 0 2 0 0  Worry too much - different things 0 2  0  Trouble relaxing 0 2 0 0  Restless 0 1 0 0  Easily annoyed or irritable 0 2 0 0  Afraid - awful might happen 0 1 0 0  Total GAD 7 Score 0 11  0  Anxiety Difficulty Not difficult at all Somewhat difficult  Not difficult at all          05/04/2023    9:48 AM 03/08/2023    1:40 PM 11/17/2022    8:17 AM 09/01/2022    2:57 PM 12/16/2021   10:26 AM  Fall Risk   Falls in the past year? 1 1 1 1  0  Number falls in past yr: 0  0 0 0  Injury with Fall? 0 0 0 0 0  Risk for fall due to :  History of fall(s)   No Fall Risks No Fall Risks  Follow up Falls evaluation completed  Falls evaluation completed Falls evaluation completed Falls evaluation completed     Immunization History  Administered Date(s) Administered   Fluad Quad(high Dose 65+) 09/16/2020, 07/03/2021   Influenza Split 07/26/2012   Influenza, High Dose Seasonal PF 08/28/2018, 06/13/2019   Influenza, Seasonal, Injecte, Preservative Fre 07/24/2010   PFIZER(Purple Top)SARS-COV-2 Vaccination 11/09/2019, 12/03/2019, 09/16/2020   Pneumococcal Conjugate-13 12/02/2020   Pneumococcal Polysaccharide-23 08/28/2018   Td (Adult),unspecified 10/18/1998   Tdap 02/06/2007   Zoster Recombinant(Shingrix) 05/10/2019, 09/10/2019   Zoster, Live 06/16/2011    No results found.  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 many MRI w/ positive white matter abnormality. MRI reported stable since 2015. mild progressive  Cognitive loss   Obstructive sleep apnea    on prior neuro notes 10/2016; no results.    Osteoporosis    had been on reclast   Post concussion syndrome 10/2013   ongoing issues with concentration, memory loss, lability; EEG completed and normal.    TBI (traumatic brain injury) (HCC) 2014   fall at [facility], hit head off of floor. No bleed, "concussion". Seen neurology since for focus and word finding.    TIA (transient ischemic attack) 11/30/2016   MRI normal. No lateralization. Speech affected and weakness during a hypotensive episdoe.    Urinary incontinence    Allergies  Allergen Reactions   Bee Venom Anaphylaxis    Other reaction(s):  Anaphylaxis   Amoxicillin Dermatitis    Yeast infection Other reaction(s): Dermatitis/Yeast Infection   Past Surgical History:  Procedure Laterality Date   BACK SURGERY  02/2015   herniated disc L4/5/6   BREAST BIOPSY  1988  biopsy   catract Bilateral 2023   [place]ONOSCOPY     [place] Around 2015   ESOPHAGOGASTRODUODENOSCOPY  2016   [place]   LAPAROSCOPIC HYSTERECTOMY     partial    SHOULDER ARTHROSCOPY W/ LABRAL REPAIR Left 02/13/2016   left shoulder   Study: cognitive eval  06/23/2016   mild cog-linguistic impairment, difficulty with verbal fluency   Study: EEG   04/12/2015   Studies: EEG completed and normal.    Study: MRI  04/28/2016   Impression: numerous non-enhancing flair hyperintense lesions w/in the brain .Suspicious for demyelinating disease such as lyme or MS. Her Lyme test were negative.    Family History  Problem Relation Age of Onset   Lung cancer Mother    Alcohol abuse Father    Throat cancer Father    Lung cancer Father    Depression Father    Heart attack Father    Breast cancer Sister    Alcohol abuse Brother    Diabetes Brother    Mental illness Sister    Stroke Daughter    Endometriosis Daughter    Heart disease Son    Breast cancer Maternal Aunt    Diabetes Maternal Grandmother    GER disease Daughter    Esophageal cancer Paternal Grandfather    Colon cancer Neg Hx    Rectal cancer Neg Hx    Stomach cancer Neg Hx    Social History   Socioeconomic History   Marital status: Single    Spouse name: Not on file   Number of children: 4   Years of education: two years of college   Highest education level: GED or equivalent  Occupational History   Occupation: retired  Tobacco Use   Smoking status: Former    Current packs/day: 0.00    Average packs/day: 1 pack/day for 19.0 years (19.0 ttl pk-yrs)    Types: Cigarettes    Start date: 43    Quit date: 2012    Years since quitting: 12.5   Smokeless tobacco: Never   Tobacco  comments:    quit 6 years ago  Vaping Use   Vaping status: Never Used  Substance and Sexual Activity   Alcohol use: Yes    Alcohol/week: 1.0 standard drink of alcohol    Types: 1 Glasses of wine per week    Comment: twice a month   Drug use: Never   Sexual activity: Not Currently    Partners: Male  Other Topics Concern   Not on file  Social History Narrative   Single. Lived in [place] area, moved to [place] to be close to her daughters 2018. Lives with her daughter.   Some college, worked as Sports coach. Retired.    Social drinker. Former smoker.   Exercises routinely.    Wears dentures.    Drinks caffeine, takes a daily vitamin.   Smoke alarm in the home. Wears her seatbelt.    Feels safe in her relationships.   3-4 cups per day.   Four children (2 biological, 2 she raised as her own).   Social Determinants of Health   Financial Resource Strain: Low Risk  (09/01/2022)   Overall Financial Resource Strain (CARDIA)    Difficulty of Paying Living Expenses: Not hard at all  Food Insecurity: No Food Insecurity (09/01/2022)   Hunger Vital Sign    Worried About Running Out of Food in the Last Year: Never true    Ran Out of Food in the Last Year: Never true  Transportation Needs: No Transportation Needs (09/01/2022)   PRAPARE - Administrator, Civil Service (Medical): No    Lack of Transportation (Non-Medical): No  Physical Activity: Insufficiently Active (09/01/2022)   Exercise Vital Sign    Days of Exercise per Week: 1 day    Minutes of Exercise per Session: 50 min  Stress: No Stress Concern Present (09/01/2022)   [employer] of Occupational Health - Occupational Stress Questionnaire    Feeling of Stress : Not at all  Social Connections: Unknown (09/01/2022)   Social Connection and Isolation Panel [NHANES]    Frequency of Communication with Friends and Family: More than three times a week    Frequency of Social Gatherings with Friends and Family: Once a week     Attends Religious Services: Patient declined    Database administrator or Organizations: Yes    Attends Engineer, structural: More than 4 times per year    Marital Status: Patient declined  Intimate Partner Violence: Not At Risk (09/01/2022)   Humiliation, Afraid, Rape, and Kick questionnaire    Fear of Current or Ex-Partner: No    Emotionally Abused: No    Physically Abused: No    Sexually Abused: No   Allergies as of 05/04/2023       Reactions   Bee Venom Anaphylaxis   Other reaction(s): Anaphylaxis   Amoxicillin Dermatitis   Yeast infection Other reaction(s): Dermatitis/Yeast Infection        Medication List        Accurate as of May 04, 2023 10:23 AM. If you have any questions, ask your nurse or doctor.          albuterol 108 (90 Base) MCG/ACT inhaler Commonly known as: VENTOLIN HFA Inhale 2 puffs into the lungs every 6 (six) hours as needed for wheezing or shortness of breath.   amLODipine 10 MG tablet Commonly known as: NORVASC Take 1 tablet (10 mg total) by mouth daily.   aspirin EC 81 MG tablet Take 81 mg by mouth daily.   atorvastatin 40 MG tablet Commonly known as: LIPITOR Take 1 tablet (40 mg total) by mouth daily.   buPROPion 200 MG 12 hr tablet Commonly known as: Wellbutrin SR Take 1 tablet (200 mg total) by mouth 2 (two) times daily.   esomeprazole 40 MG capsule Commonly known as: NEXIUM TAKE ONE CAPSULE BY MOUTH ONE TIME DAILY AT NOON   famotidine 20 MG tablet Commonly known as: PEPCID Take 1 tablet (20 mg total) by mouth daily.   FLUoxetine 40 MG capsule Commonly known as: PROZAC Take 1 capsule (40 mg total) by mouth daily.   levothyroxine 25 MCG tablet Commonly known as: SYNTHROID Take 1 tablet (25 mcg total) by mouth daily before breakfast. On an empty stomach   lisinopril 5 MG tablet Commonly known as: ZESTRIL Take 0.5 tablets (2.5 mg total) by mouth daily.   mirabegron ER 50 MG Tb24 tablet Commonly known as:  Myrbetriq Take 1 tablet (50 mg total) by mouth daily.   Omega 3 1200 MG Caps   Tdap 5-2.5-18.5 LF-MCG/0.5 injection Commonly known as: BOOSTRIX Inject 0.5 mLs into the muscle once for 1 dose.   TURMERIC PO Take by mouth.   Vitamin D 50 MCG (2000 UT) tablet        All past medical history, surgical history, allergies, family history, immunizations andmedications were updated in the EMR today and reviewed under the history and medication portions of their EMR.  No results found for this or any previous visit (from the past 2160 hour(s)).   Patient was never admitted.   ROS: 14 pt review of systems performed and negative (unless mentioned in an HPI)  Objective: BP (!) 140/79   Pulse 69   Temp 98.2 F (36.8 C) (Oral)   Wt 156 lb 3.2 oz (70.9 kg)   LMP 10/18/1990   SpO2 96%   BMI 28.57 kg/m  Physical Exam Vitals and nursing note reviewed.  Constitutional:      General: She is not in acute distress.    Appearance: Normal appearance. She is not ill-appearing, toxic-appearing or diaphoretic.  HENT:     Head: Normocephalic and atraumatic.  Eyes:     General: No scleral icterus.       Right eye: No discharge.        Left eye: No discharge.     Extraocular Movements: Extraocular movements intact.     Conjunctiva/sclera: Conjunctivae normal.     Pupils: Pupils are equal, round, and reactive to light.  Cardiovascular:     Rate and Rhythm: Normal rate and regular rhythm.     Heart sounds: No murmur heard.    No gallop.  Pulmonary:     Effort: Pulmonary effort is normal. No respiratory distress.     Breath sounds: Normal breath sounds. No wheezing, rhonchi or rales.  Musculoskeletal:     Cervical back: Neck supple.     Right lower leg: No edema.     Left lower leg: No edema.  Lymphadenopathy:     Cervical: No cervical adenopathy.  Skin:    General: Skin is warm and dry.     Coloration: Skin is not jaundiced or pale.     Findings: No erythema or rash.   Neurological:     Mental Status: She is alert and oriented to person, place, and time. Mental status is at baseline.     Motor: No weakness.     Gait: Gait normal.  Psychiatric:        Mood and Affect: Mood normal.        Behavior: Behavior normal.        Thought Content: Thought content normal.        Judgment: Judgment normal.      Assessment/plan: Joesphine Schemm is a 72 y.o. female present for Chronic Conditions/illness Management Depression with anxiety Feeling better Continue fluoxetine 40 mg cap Continue Wellbutrin  200 mg BID - Referral has been placed last visit for replacement,since her provider is no longer practicing.  - Tried in the past: celexa and effexor.   Essential hypertension/morbid obesity/HLD/CKD 3 Above goal Add lisinopril 2.5 mg every day today Continue amlodipine 10 mg daily to [facility]. Continue atorvastatin 40 mg daily, refills called into [facility]. continue ASA Labs: cbc, cmp, tsh, lipids  RAD after covid: Start albuterol 2 puffs prior to exercise HPI seems consistent with exercise induced asthma/RAD after covid.    Traumatic brain injury with loss of consciousness, sequela (HCC)/White matter disease/TIA//ataxia Stable Aricept 10 mg qd> managed by neuro> stopped She worked with physical therapy and did really well with improving her gait. Patient is established with neurology.  overactive bladder Stable Continue Myrbetriq 50 mg daily. >  Mail-in prescription Osteoporosis/hyper PTH/hypercalcemia: Pth/ ca/ vitd collected Encounter for monitoring long-term proton pump inhibitor therapy/GERD Stable Continue omeprazole. Continue Pepcid. Labs:b12, vitd, mag collected  Hypothyroidism due to acquired atrophy of thyroid Continue levo 25 mcg Tsh collected  Stage 3a chronic  kidney disease (HCC) stable Avoid nsaids when able.  Monitor yearly  Hypercalcemia Chronic.hydrate PTH and vitamin D collected   Return in about 24 weeks (around  10/19/2023) for Routine chronic condition follow-up.   Orders Placed This Encounter  Procedures   CBC   Comp Met (CMET)   TSH   Lipid panel   Vitamin D (25 hydroxy)   PTH, Intact and Calcium   B12 and Folate Panel   Magnesium    Meds ordered this encounter  Medications   amLODipine (NORVASC) 10 MG tablet    Sig: Take 1 tablet (10 mg total) by mouth daily.    Dispense:  90 tablet    Refill:  1   esomeprazole (NEXIUM) 40 MG capsule    Sig: TAKE ONE CAPSULE BY MOUTH ONE TIME DAILY AT NOON    Dispense:  90 capsule    Refill:  1   famotidine (PEPCID) 20 MG tablet    Sig: Take 1 tablet (20 mg total) by mouth daily.    Dispense:  90 tablet    Refill:  1   atorvastatin (LIPITOR) 40 MG tablet    Sig: Take 1 tablet (40 mg total) by mouth daily.    Dispense:  90 tablet    Refill:  3   buPROPion (WELLBUTRIN SR) 200 MG 12 hr tablet    Sig: Take 1 tablet (200 mg total) by mouth 2 (two) times daily.    Dispense:  180 tablet    Refill:  1   FLUoxetine (PROZAC) 40 MG capsule    Sig: Take 1 capsule (40 mg total) by mouth daily.    Dispense:  90 capsule    Refill:  1   mirabegron ER (MYRBETRIQ) 50 MG TB24 tablet    Sig: Take 1 tablet (50 mg total) by mouth daily.    Dispense:  90 tablet    Refill:  3   lisinopril (ZESTRIL) 5 MG tablet    Sig: Take 0.5 tablets (2.5 mg total) by mouth daily.    Dispense:  45 tablet    Refill:  1   Tdap (BOOSTRIX) 5-2.5-18.5 LF-MCG/0.5 injection    Sig: Inject 0.5 mLs into the muscle once for 1 dose.    Dispense:  0.5 mL    Refill:  0   albuterol (VENTOLIN HFA) 108 (90 Base) MCG/ACT inhaler    Sig: Inhale 2 puffs into the lungs every 6 (six) hours as needed for wheezing or shortness of breath.    Dispense:  8 g    Refill:  0    Referral Orders  No referral(s) requested today      Note is dictated utilizing voice recognition software. Although note has been proof read prior to signing, occasional typographical errors still can be missed. If  any questions arise, please do not hesitate to call for verification.  Electronically signed by: Felix Pacini, DO [facility] [place]

## 2023-05-05 ENCOUNTER — Telehealth: Payer: Self-pay | Admitting: Family Medicine

## 2023-05-05 LAB — PTH, INTACT AND CALCIUM
Calcium: 10.3 mg/dL (ref 8.6–10.4)
PTH: 23 pg/mL (ref 16–77)

## 2023-05-05 MED ORDER — FOLIC ACID 1 MG PO TABS: 1.0000 mg | ORAL_TABLET | Freq: Every day | ORAL | 3 refills | Status: DC

## 2023-05-05 MED ORDER — LEVOTHYROXINE SODIUM 25 MCG PO TABS: 25.0000 ug | ORAL_TABLET | Freq: Every day | ORAL | 3 refills | Status: DC

## 2023-05-05 NOTE — Telephone Encounter (Signed)
Spoke with patient regarding results/recommendations.  

## 2023-05-05 NOTE — Telephone Encounter (Signed)
Please call patient Liver, kidney, parathyroid and thyroid function are normal Blood cell counts, electrolytes, vitamin D and B12 levels are normal Folate levels are lower than normal.  I have called in a folate supplement for her to take 1 tab daily

## 2023-05-27 ENCOUNTER — Other Ambulatory Visit: Payer: Self-pay | Admitting: Family Medicine

## 2023-06-01 ENCOUNTER — Ambulatory Visit (INDEPENDENT_AMBULATORY_CARE_PROVIDER_SITE_OTHER): Payer: Medicare Other | Admitting: Psychology

## 2023-06-01 DIAGNOSIS — F4323 Adjustment disorder with mixed anxiety and depressed mood: Secondary | ICD-10-CM

## 2023-06-01 NOTE — Progress Notes (Signed)
[facility] Counselor/Therapist Progress Note  Patient ID: Abigail Michael, MRN: [id number],    Date: 06/01/2023  Time Spent: 10:00am-10:55am   55 minutes   Treatment Type: Individual Therapy  Reported Symptoms: stress, worrying  Mental Status Exam: Appearance:  Casual     Behavior: Appropriate  Motor: Normal  Speech/Language:  Normal Rate  Affect: Appropriate  Mood: normal  Thought process: normal  Thought content:   WNL  Sensory/Perceptual disturbances:   WNL  Orientation: oriented to person, place, time/date, and situation  Attention: Good  Concentration: Good  Memory: WNL  Fund of knowledge:  Good  Insight:   Good  Judgment:  Good  Impulse Control: Good   Risk Assessment: Danger to Self:  No Self-injurious Behavior: No Danger to Others: No Duty to Warn:no Physical Aggression / Violence:No  Access to Firearms a concern: No  Gang Involvement:No   Subjective: Pt present for face-to-face individual therapy in person.   Pt talked about her vacation up [place] to be with family.  Pt had a nice time with family.   Pt has been keeping busy.  She has been pet sitting for her daughter's cats.  She has been getting together with friends and goes out dancing.   Pt states she is forgetting a lot of things which worries her.   Addressed what accommodations pt can put into place to keep track of things.  Pt states she is use to putting everyone else first but now is trying to give her needs priority.   Pt has been worrying about one of her daughters who has to find a new job. Addressed pt's concerns.  Pt tends to worry about the future.  Pt tends to be hard on herself.  Pt is trying to learn that she can't fix other people's problems.  Worked on healthy boundary setting.  Worked on worry management.   Pt has also been using her faith to cope.   Worked on self care strategies.   Provided supportive therapy.  Interventions: Cognitive Behavioral Therapy and  Insight-Oriented  Diagnosis: F43.23   Plan of Care:  Recommend ongoing therapy. Pt participated in setting treatment goals.   Plan to meet monthly.  Treatment Plan (target date: 09/23/2023) Client Abilities/Strengths  Pt is bright, engaging, and motivated for therapy.  Client Treatment Preferences  Individual therapy.  Client Statement of Needs  Improve copings skills and Improve self esteem.  Symptoms  Depressed or irritable mood. Excessive and/or unrealistic worry that is difficult to control occurring more days than not for at least 6 months about a number of events or activities. Hypervigilance (e.g., feeling constantly on edge, experiencing concentration difficulties, having trouble falling or staying asleep, exhibiting a general state of irritability). Low self-esteem. Problems Addressed  Unipolar Depression, Anxiety Goals 1. Alleviate depressive symptoms and return to previous level of effective functioning. 2. Appropriately grieve the loss in order to normalize mood and to return to previously adaptive level of functioning. Objective Learn and implement behavioral strategies to overcome depression. Target Date: 2023-09-23 Frequency: Monthly  Progress: 50 Modality: individual  Related Interventions Assist the client in developing skills that increase the likelihood of deriving pleasure from behavioral activation (e.g., assertiveness skills, developing an exercise plan, less internal/more external focus, increased social involvement); reinforce success. Engage the client in "behavioral activation," increasing his/her activity level and contact with sources of reward, while identifying processes that inhibit activation. use behavioral techniques such as instruction, rehearsal, role-playing, role reversal, as needed, to facilitate activity in the  client's daily life; reinforce success. 3. Develop healthy interpersonal relationships that lead to the alleviation and help prevent the  relapse of depression. 4. Develop healthy thinking patterns and beliefs about self, others, and the world that lead to the alleviation and help prevent the relapse of depression. 5. Enhance ability to effectively cope with the full variety of life's worries and anxieties. 6. Learn and implement coping skills that result in a reduction of anxiety and worry, and improved daily functioning. Objective Learn and implement problem-solving strategies for realistically addressing worries. Target Date: 2023-09-23 Frequency: Monthly  Progress: 50 Modality: individual  Related Interventions Assign the client a homework exercise in which he/she problem-solves a current problem (see Mastery of Your Anxiety and Worry: Workbook by Elenora Fender and Filbert Schilder or Generalized Anxiety Disorder by Elesa Hacker, and Filbert Schilder); review, reinforce success, and provide corrective feedback toward improvement. Teach the client problem-solving strategies involving specifically defining a problem, generating options for addressing it, evaluating the pros and cons of each option, selecting and implementing an optional action, and reevaluating and refining the action. Objective Learn and implement calming skills to reduce overall anxiety and manage anxiety symptoms. Target Date: 2023-09-23 Frequency: Monthly  Progress: 50 Modality: individual  Related Interventions Assign the client to read about progressive muscle relaxation and other calming strategies in relevant books or treatment manuals (e.g., Progressive Relaxation Training by Twana First; Mastery of Your Anxiety and Worry: Workbook by Earlie Counts). Assign the client homework each session in which he/she practices relaxation exercises daily, gradually applying them progressively from non-anxiety-provoking to anxiety-provoking situations; review and reinforce success while providing corrective feedback toward improvement. Teach the client calming/relaxation skills  (e.g., applied relaxation, progressive muscle relaxation, cue controlled relaxation; mindful breathing; biofeedback) and how to discriminate better between relaxation and tension; teach the client how to apply these skills to his/her daily life. 7. Recognize, accept, and cope with feelings of depression. 8. Reduce overall frequency, intensity, and duration of the anxiety so that daily functioning is not impaired. 9. Resolve the core conflict that is the source of anxiety. 10. Stabilize anxiety level while increasing ability to function on a daily basis. Diagnosis F43.23 Conditions For Discharge Achievement of treatment goals and objectives   Salomon Fick, LCSW

## 2023-06-29 ENCOUNTER — Ambulatory Visit (INDEPENDENT_AMBULATORY_CARE_PROVIDER_SITE_OTHER): Payer: Medicare Other | Admitting: Psychology

## 2023-06-29 DIAGNOSIS — F4323 Adjustment disorder with mixed anxiety and depressed mood: Secondary | ICD-10-CM | POA: Diagnosis not present

## 2023-06-29 NOTE — Progress Notes (Signed)
[facility] Counselor/Therapist Progress Note  Patient ID: Abigail Michael, MRN: [id number],    Date: 06/29/2023  Time Spent: 12:00pm-12:55pm   55 minutes   Treatment Type: Individual Therapy  Reported Symptoms: stress, worrying  Mental Status Exam: Appearance:  Casual     Behavior: Appropriate  Motor: Normal  Speech/Language:  Normal Rate  Affect: Appropriate  Mood: normal  Thought process: normal  Thought content:   WNL  Sensory/Perceptual disturbances:   WNL  Orientation: oriented to person, place, time/date, and situation  Attention: Good  Concentration: Good  Memory: WNL  Fund of knowledge:  Good  Insight:   Good  Judgment:  Good  Impulse Control: Good   Risk Assessment: Danger to Self:  No Self-injurious Behavior: No Danger to Others: No Duty to Warn:no Physical Aggression / Violence:No  Access to Firearms a concern: No  Gang Involvement:No   Subjective: Pt present for face-to-face individual therapy in person.   Pt talked about her [name] who has been staying between here and [place].   Abigail Michael has been having a rough time transitioning.  Addressed pt's concerns about her daughter.  Abigail Michael is going to move in with her sister Abigail Michael.  There can be stressful family dynamics between the sisters.   Pt feels the impulse to try to fix things but is trying to pull back and not get in the middle.   Worked on loving detachment. Pt talked about meeting a guy.  Pt is learning to speak up for herself and be more assertive.   Pt continues to get together with friend often and stay busy.   She is trying to have a positive mindset and take care of herself.  Worked on self care strategies.   Provided supportive therapy.  Interventions: Cognitive Behavioral Therapy and Insight-Oriented  Diagnosis: F43.23   Plan of Care:  Recommend ongoing therapy. Pt participated in setting treatment goals.   Plan to meet monthly.  Treatment Plan (target  date: 09/23/2023) Client Abilities/Strengths  Pt is bright, engaging, and motivated for therapy.  Client Treatment Preferences  Individual therapy.  Client Statement of Needs  Improve copings skills and Improve self esteem.  Symptoms  Depressed or irritable mood. Excessive and/or unrealistic worry that is difficult to control occurring more days than not for at least 6 months about a number of events or activities. Hypervigilance (e.g., feeling constantly on edge, experiencing concentration difficulties, having trouble falling or staying asleep, exhibiting a general state of irritability). Low self-esteem. Problems Addressed  Unipolar Depression, Anxiety Goals 1. Alleviate depressive symptoms and return to previous level of effective functioning. 2. Appropriately grieve the loss in order to normalize mood and to return to previously adaptive level of functioning. Objective Learn and implement behavioral strategies to overcome depression. Target Date: 2023-09-23 Frequency: Monthly  Progress: 50 Modality: individual  Related Interventions Assist the client in developing skills that increase the likelihood of deriving pleasure from behavioral activation (e.g., assertiveness skills, developing an exercise plan, less internal/more external focus, increased social involvement); reinforce success. Engage the client in "behavioral activation," increasing his/her activity level and contact with sources of reward, while identifying processes that inhibit activation. use behavioral techniques such as instruction, rehearsal, role-playing, role reversal, as needed, to facilitate activity in the client's daily life; reinforce success. 3. Develop healthy interpersonal relationships that lead to the alleviation and help prevent the relapse of depression. 4. Develop healthy thinking patterns and beliefs about self, others, and the world that lead to the  alleviation and help prevent the relapse of depression. 5.  Enhance ability to effectively cope with the full variety of life's worries and anxieties. 6. Learn and implement coping skills that result in a reduction of anxiety and worry, and improved daily functioning. Objective Learn and implement problem-solving strategies for realistically addressing worries. Target Date: 2023-09-23 Frequency: Monthly  Progress: 50 Modality: individual  Related Interventions Assign the client a homework exercise in which he/she problem-solves a current problem (see Mastery of Your Anxiety and Worry: Workbook by Elenora Fender and Filbert Schilder or Generalized Anxiety Disorder by Elesa Hacker, and Filbert Schilder); review, reinforce success, and provide corrective feedback toward improvement. Teach the client problem-solving strategies involving specifically defining a problem, generating options for addressing it, evaluating the pros and cons of each option, selecting and implementing an optional action, and reevaluating and refining the action. Objective Learn and implement calming skills to reduce overall anxiety and manage anxiety symptoms. Target Date: 2023-09-23 Frequency: Monthly  Progress: 50 Modality: individual  Related Interventions Assign the client to read about progressive muscle relaxation and other calming strategies in relevant books or treatment manuals (e.g., Progressive Relaxation Training by Twana First; Mastery of Your Anxiety and Worry: Workbook by Earlie Counts). Assign the client homework each session in which he/she practices relaxation exercises daily, gradually applying them progressively from non-anxiety-provoking to anxiety-provoking situations; review and reinforce success while providing corrective feedback toward improvement. Teach the client calming/relaxation skills (e.g., applied relaxation, progressive muscle relaxation, cue controlled relaxation; mindful breathing; biofeedback) and how to discriminate better between relaxation and tension; teach  the client how to apply these skills to his/her daily life. 7. Recognize, accept, and cope with feelings of depression. 8. Reduce overall frequency, intensity, and duration of the anxiety so that daily functioning is not impaired. 9. Resolve the core conflict that is the source of anxiety. 10. Stabilize anxiety level while increasing ability to function on a daily basis. Diagnosis F43.23 Conditions For Discharge Achievement of treatment goals and objectives   Salomon Fick, LCSW

## 2023-07-27 ENCOUNTER — Ambulatory Visit: Payer: Medicare Other | Admitting: Psychology

## 2023-08-31 ENCOUNTER — Ambulatory Visit: Payer: Medicare Other | Admitting: Psychology

## 2023-08-31 DIAGNOSIS — F4323 Adjustment disorder with mixed anxiety and depressed mood: Secondary | ICD-10-CM

## 2023-08-31 NOTE — Progress Notes (Signed)
[facility] Counselor/Therapist Progress Note  Patient ID: Karrol Perot, MRN: [id number],    Date: 08/31/2023  Time Spent: 11:00am-11:50am   50 minutes   Treatment Type: Individual Therapy  Reported Symptoms: stress, worrying  Mental Status Exam: Appearance:  Casual     Behavior: Appropriate  Motor: Normal  Speech/Language:  Normal Rate  Affect: Appropriate  Mood: normal  Thought process: normal  Thought content:   WNL  Sensory/Perceptual disturbances:   WNL  Orientation: oriented to person, place, time/date, and situation  Attention: Good  Concentration: Good  Memory: WNL  Fund of knowledge:  Good  Insight:   Good  Judgment:  Good  Impulse Control: Good   Risk Assessment: Danger to Self:  No Self-injurious Behavior: No Danger to Others: No Duty to Warn:no Physical Aggression / Violence:No  Access to Firearms a concern: No  Gang Involvement:No   Subjective: Pt present for face-to-face individual therapy in person.   Pt talked about being very busy.  Pt's sister and husband were traveling last week and their car broke down on the highway.   Pt helped them and let them stay with her for a week.  Pt's relationship with her sister has been challenging so pt was anxious about it.   Pt's sister can say hurtful things when she gets mad.  Addressed the family dynamics.    Pt talked about her daughter's.  Her daughter [name] is now living with pt's other daughter so pt has both girls within 5 minutes from her house now.  [name] has had a hard year so pt has been trying to be a support to her.  Pt worries about her daughter.   Pt talked about the guy Amada Jupiter she is dating.   Pt feels excited about the relationship but is also nervous about it bc she has not dated in a long time.   Amada Jupiter is 73 years old and pt is 72.   Addressed the relationship dynamics and helped pt navigate the new relationship.  Pt wants to take things slowly.   Addressed how she can  communicate this effectively to [facility].   Pt talked about the holidays.   She will be with her daughters.   Worked on self care strategies.   Provided supportive therapy.  Interventions: Cognitive Behavioral Therapy and Insight-Oriented  Diagnosis: F43.23   Plan of Care:  Recommend ongoing therapy. Pt participated in setting treatment goals.   Plan to meet monthly.  Treatment Plan (target date: 09/23/2023) Client Abilities/Strengths  Pt is bright, engaging, and motivated for therapy.  Client Treatment Preferences  Individual therapy.  Client Statement of Needs  Improve copings skills and Improve self esteem.  Symptoms  Depressed or irritable mood. Excessive and/or unrealistic worry that is difficult to control occurring more days than not for at least 6 months about a number of events or activities. Hypervigilance (e.g., feeling constantly on edge, experiencing concentration difficulties, having trouble falling or staying asleep, exhibiting a general state of irritability). Low self-esteem. Problems Addressed  Unipolar Depression, Anxiety Goals 1. Alleviate depressive symptoms and return to previous level of effective functioning. 2. Appropriately grieve the loss in order to normalize mood and to return to previously adaptive level of functioning. Objective Learn and implement behavioral strategies to overcome depression. Target Date: 2023-09-23 Frequency: Monthly  Progress: 50 Modality: individual  Related Interventions Assist the client in developing skills that increase the likelihood of deriving pleasure from behavioral activation (e.g., assertiveness skills, developing an exercise plan,  less internal/more external focus, increased social involvement); reinforce success. Engage the client in "behavioral activation," increasing his/her activity level and contact with sources of reward, while identifying processes that inhibit activation. use behavioral techniques such as instruction,  rehearsal, role-playing, role reversal, as needed, to facilitate activity in the client's daily life; reinforce success. 3. Develop healthy interpersonal relationships that lead to the alleviation and help prevent the relapse of depression. 4. Develop healthy thinking patterns and beliefs about self, others, and the world that lead to the alleviation and help prevent the relapse of depression. 5. Enhance ability to effectively cope with the full variety of life's worries and anxieties. 6. Learn and implement coping skills that result in a reduction of anxiety and worry, and improved daily functioning. Objective Learn and implement problem-solving strategies for realistically addressing worries. Target Date: 2023-09-23 Frequency: Monthly  Progress: 50 Modality: individual  Related Interventions Assign the client a homework exercise in which he/she problem-solves a current problem (see Mastery of Your Anxiety and Worry: Workbook by Elenora Fender and Filbert Schilder or Generalized Anxiety Disorder by Elesa Hacker, and Filbert Schilder); review, reinforce success, and provide corrective feedback toward improvement. Teach the client problem-solving strategies involving specifically defining a problem, generating options for addressing it, evaluating the pros and cons of each option, selecting and implementing an optional action, and reevaluating and refining the action. Objective Learn and implement calming skills to reduce overall anxiety and manage anxiety symptoms. Target Date: 2023-09-23 Frequency: Monthly  Progress: 50 Modality: individual  Related Interventions Assign the client to read about progressive muscle relaxation and other calming strategies in relevant books or treatment manuals (e.g., Progressive Relaxation Training by Twana First; Mastery of Your Anxiety and Worry: Workbook by Earlie Counts). Assign the client homework each session in which he/she practices relaxation exercises daily, gradually  applying them progressively from non-anxiety-provoking to anxiety-provoking situations; review and reinforce success while providing corrective feedback toward improvement. Teach the client calming/relaxation skills (e.g., applied relaxation, progressive muscle relaxation, cue controlled relaxation; mindful breathing; biofeedback) and how to discriminate better between relaxation and tension; teach the client how to apply these skills to his/her daily life. 7. Recognize, accept, and cope with feelings of depression. 8. Reduce overall frequency, intensity, and duration of the anxiety so that daily functioning is not impaired. 9. Resolve the core conflict that is the source of anxiety. 10. Stabilize anxiety level while increasing ability to function on a daily basis. Diagnosis F43.23 Conditions For Discharge Achievement of treatment goals and objectives   Salomon Fick, LCSW

## 2023-09-07 ENCOUNTER — Ambulatory Visit (INDEPENDENT_AMBULATORY_CARE_PROVIDER_SITE_OTHER): Payer: Medicare Other | Admitting: *Deleted

## 2023-09-07 DIAGNOSIS — Z1211 Encounter for screening for malignant neoplasm of colon: Secondary | ICD-10-CM

## 2023-09-07 DIAGNOSIS — Z0000 Encounter for general adult medical examination without abnormal findings: Secondary | ICD-10-CM | POA: Diagnosis not present

## 2023-09-07 DIAGNOSIS — Z1231 Encounter for screening mammogram for malignant neoplasm of breast: Secondary | ICD-10-CM

## 2023-09-07 NOTE — Patient Instructions (Signed)
Abigail Michael , Thank you for taking time to come for your Medicare Wellness Visit. I appreciate your ongoing commitment to your health goals. Please review the following plan we discussed and let me know if I can assist you in the future.   Screening recommendations/referrals: Colonoscopy: Education provided Mammogram: Education provided Bone Density: Education provided Recommended yearly ophthalmology/optometry visit for glaucoma screening and checkup Recommended yearly dental visit for hygiene and checkup  Vaccinations: Influenza vaccine: Education provided Pneumococcal vaccine: up to date Tdap vaccine: up to date Shingles vaccine:       Preventive Care 65 Years and Older, Female Preventive care refers to lifestyle choices and visits with your health care provider that can promote health and wellness. What does preventive care include? A yearly physical exam. This is also called an annual well check. Dental exams once or twice a year. Routine eye exams. Ask your health care provider how often you should have your eyes checked. Personal lifestyle choices, including: Daily care of your teeth and gums. Regular physical activity. Eating a healthy diet. Avoiding tobacco and drug use. Limiting alcohol use. Practicing safe sex. Taking low-dose aspirin every day. Taking vitamin and mineral supplements as recommended by your health care provider. What happens during an annual well check? The services and screenings done by your health care provider during your annual well check will depend on your age, overall health, lifestyle risk factors, and family history of disease. Counseling  Your health care provider may ask you questions about your: Alcohol use. Tobacco use. Drug use. Emotional well-being. Home and relationship well-being. Sexual activity. Eating habits. History of falls. Memory and ability to understand (cognition). Work and work Astronomer. Reproductive  health. Screening  You may have the following tests or measurements: Height, weight, and BMI. Blood pressure. Lipid and cholesterol levels. These may be checked every 5 years, or more frequently if you are over 6 years old. Skin check. Lung cancer screening. You may have this screening every year starting at age 68 if you have a 30-pack-year history of smoking and currently smoke or have quit within the past 15 years. Fecal occult blood test (FOBT) of the stool. You may have this test every year starting at age 35. Flexible sigmoidoscopy or colonoscopy. You may have a sigmoidoscopy every 5 years or a colonoscopy every 10 years starting at age 39. Hepatitis C blood test. Hepatitis B blood test. Sexually transmitted disease (STD) testing. Diabetes screening. This is done by checking your blood sugar (glucose) after you have not eaten for a while (fasting). You may have this done every 1-3 years. Bone density scan. This is done to screen for osteoporosis. You may have this done starting at age 41. Mammogram. This may be done every 1-2 years. Talk to your health care provider about how often you should have regular mammograms. Talk with your health care provider about your test results, treatment options, and if necessary, the need for more tests. Vaccines  Your health care provider may recommend certain vaccines, such as: Influenza vaccine. This is recommended every year. Tetanus, diphtheria, and acellular pertussis (Tdap, Td) vaccine. You may need a Td booster every 10 years. Zoster vaccine. You may need this after age 66. Pneumococcal 13-valent conjugate (PCV13) vaccine. One dose is recommended after age 67. Pneumococcal polysaccharide (PPSV23) vaccine. One dose is recommended after age 79. Talk to your health care provider about which screenings and vaccines you need and how often you need them. This information is not intended to replace  advice given to you by your health care provider.  Make sure you discuss any questions you have with your health care provider. Document Released: 10/31/2015 Document Revised: 06/23/2016 Document Reviewed: 08/05/2015 Elsevier Interactive Patient Education  2017 [employer].  Fall Prevention in the Home Falls can cause injuries. They can happen to people of all ages. There are many things you can do to make your home safe and to help prevent falls. What can I do on the outside of my home? Regularly fix the edges of walkways and driveways and fix any cracks. Remove anything that might make you trip as you walk through a door, such as a raised step or threshold. Trim any bushes or trees on the path to your home. Use bright outdoor lighting. Clear any walking paths of anything that might make someone trip, such as rocks or tools. Regularly check to see if handrails are loose or broken. Make sure that both sides of any steps have handrails. Any raised decks and porches should have guardrails on the edges. Have any leaves, snow, or ice cleared regularly. Use sand or salt on walking paths during winter. Clean up any spills in your garage right away. This includes oil or grease spills. What can I do in the bathroom? Use night lights. Install grab bars by the toilet and in the tub and shower. Do not use towel bars as grab bars. Use non-skid mats or decals in the tub or shower. If you need to sit down in the shower, use a plastic, non-slip stool. Keep the floor dry. Clean up any water that spills on the floor as soon as it happens. Remove soap buildup in the tub or shower regularly. Attach bath mats securely with double-sided non-slip rug tape. Do not have throw rugs and other things on the floor that can make you trip. What can I do in the bedroom? Use night lights. Make sure that you have a light by your bed that is easy to reach. Do not use any sheets or blankets that are too big for your bed. They should not hang down onto the floor. Have a  firm chair that has side arms. You can use this for support while you get dressed. Do not have throw rugs and other things on the floor that can make you trip. What can I do in the kitchen? Clean up any spills right away. Avoid walking on wet floors. Keep items that you use a lot in easy-to-reach places. If you need to reach something above you, use a strong step stool that has a grab bar. Keep electrical cords out of the way. Do not use floor polish or wax that makes floors slippery. If you must use wax, use non-skid floor wax. Do not have throw rugs and other things on the floor that can make you trip. What can I do with my stairs? Do not leave any items on the stairs. Make sure that there are handrails on both sides of the stairs and use them. Fix handrails that are broken or loose. Make sure that handrails are as long as the stairways. Check any carpeting to make sure that it is firmly attached to the stairs. Fix any carpet that is loose or worn. Avoid having throw rugs at the top or bottom of the stairs. If you do have throw rugs, attach them to the floor with carpet tape. Make sure that you have a light switch at the top of the stairs and the bottom  of the stairs. If you do not have them, ask someone to add them for you. What else can I do to help prevent falls? Wear shoes that: Do not have high heels. Have rubber bottoms. Are comfortable and fit you well. Are closed at the toe. Do not wear sandals. If you use a stepladder: Make sure that it is fully opened. Do not climb a closed stepladder. Make sure that both sides of the stepladder are locked into place. Ask someone to hold it for you, if possible. Clearly mark and make sure that you can see: Any grab bars or handrails. First and last steps. Where the edge of each step is. Use tools that help you move around (mobility aids) if they are needed. These include: Canes. Walkers. Scooters. Crutches. Turn on the lights when you  go into a dark area. Replace any light bulbs as soon as they burn out. Set up your furniture so you have a clear path. Avoid moving your furniture around. If any of your floors are uneven, fix them. If there are any pets around you, be aware of where they are. Review your medicines with your doctor. Some medicines can make you feel dizzy. This can increase your chance of falling. Ask your doctor what other things that you can do to help prevent falls. This information is not intended to replace advice given to you by your health care provider. Make sure you discuss any questions you have with your health care provider. Document Released: 07/31/2009 Document Revised: 03/11/2016 Document Reviewed: 11/08/2014 Elsevier Interactive Patient Education  2017 [employer].

## 2023-09-07 NOTE — Progress Notes (Signed)
Subjective:   Abigail Michael is a 72 y.o. female who presents for Medicare Annual (Subsequent) preventive examination.  Visit Complete: Virtual I connected with  Nayara Bry on 09/07/23 by a audio enabled telemedicine application and verified that I am speaking with the correct person using two identifiers.  Patient Location: Home  Provider Location: Home Office  I discussed the limitations of evaluation and management by telemedicine. The patient expressed understanding and agreed to proceed.  Vital Signs: Because this visit was a virtual/telehealth visit, some criteria may be missing or patient reported. Any vitals not documented were not able to be obtained and vitals that have been documented are patient reported.   Cardiac Risk Factors include: advanced age (>29men, >26 women);hypertension     Objective:    There were no vitals filed for this visit. There is no height or weight on file to calculate BMI.     09/07/2023    2:17 PM 09/01/2022    2:57 PM 08/06/2020    9:04 AM 07/17/2019   10:40 AM 09/05/2018    2:53 PM 08/22/2018    8:52 AM  Advanced Directives  Does Patient Have a Medical Advance Directive? No;Yes Yes No Yes No No  Type of Estate agent of [employer] Power of [place];Living will  Healthcare Power of [place];Living will    Copy of Healthcare Power of Attorney in Chart? No - copy requested   No - copy requested    Would patient like information on creating a medical advance directive? No - Patient declined  Yes (MAU/Ambulatory/Procedural Areas - Information given)  No - Patient declined Yes (MAU/Ambulatory/Procedural Areas - Information given)    Current Medications (verified) Outpatient Encounter Medications as of 09/07/2023  Medication Sig   albuterol (VENTOLIN HFA) 108 (90 Base) MCG/ACT inhaler Inhale 2 puffs into the lungs every 6 (six) hours as needed for wheezing or shortness of breath.   amLODipine (NORVASC) 10 MG  tablet Take 1 tablet (10 mg total) by mouth daily.   aspirin EC 81 MG tablet Take 81 mg by mouth daily.    atorvastatin (LIPITOR) 40 MG tablet Take 1 tablet (40 mg total) by mouth daily.   buPROPion (WELLBUTRIN SR) 200 MG 12 hr tablet Take 1 tablet (200 mg total) by mouth 2 (two) times daily.   Cholecalciferol (VITAMIN D) 50 MCG (2000 UT) tablet    esomeprazole (NEXIUM) 40 MG capsule TAKE ONE CAPSULE BY MOUTH ONE TIME DAILY AT NOON   famotidine (PEPCID) 20 MG tablet Take 1 tablet (20 mg total) by mouth daily.   FLUoxetine (PROZAC) 40 MG capsule Take 1 capsule (40 mg total) by mouth daily.   folic acid (FOLVITE) 1 MG tablet Take 1 tablet (1 mg total) by mouth daily.   levothyroxine (SYNTHROID) 25 MCG tablet Take 1 tablet (25 mcg total) by mouth daily before breakfast. On an empty stomach   lisinopril (ZESTRIL) 5 MG tablet Take 0.5 tablets (2.5 mg total) by mouth daily.   mirabegron ER (MYRBETRIQ) 50 MG TB24 tablet Take 1 tablet (50 mg total) by mouth daily.   No facility-administered encounter medications on file as of 09/07/2023.    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 many MRI w/ positive white matter abnormality. MRI reported stable since 2015. mild progressive  Cognitive loss   Obstructive sleep apnea    on prior neuro notes 10/2016; no results.    Osteoporosis    had been on reclast   Post concussion syndrome 10/2013   ongoing issues with concentration, memory loss, lability; EEG completed and normal.    TBI (traumatic brain injury) (HCC) 2014    fall at [facility], hit head off of floor. No bleed, "concussion". Seen neurology since for focus and word finding.    TIA (transient ischemic attack) 11/30/2016   MRI normal. No lateralization. Speech affected and weakness during a hypotensive episdoe.    Urinary incontinence    Past Surgical History:  Procedure Laterality Date   BACK SURGERY  02/2015   herniated disc L4/5/6   BREAST BIOPSY  1988   biopsy   catract Bilateral 2023   [place]OSCOPY     [place] Around 2015   ESOPHAGO[place]ENOSCOPY  2016   [place]   LAPAROSCOPIC HYSTERECTOMY     partial    SHOULDER ARTHROSCOPY W/ LABRAL REPAIR Left 02/13/2016   left shoulder   Study: cognitive eval  06/23/2016   mild cog-linguistic impairment, difficulty with verbal fluency   Study: EEG   04/12/2015   Studies: EEG completed and normal.    Study: MRI  04/28/2016   Impression: numerous non-enhancing flair hyperintense lesions w/in the brain .Suspicious for demyelinating disease such as lyme or MS. Her Lyme test were negative.    Family History  Problem Relation Age of Onset   Lung cancer Mother    Alcohol abuse Father    Throat cancer Father    Lung cancer Father    Depression Father    Heart attack Father    Breast cancer Sister    Alcohol abuse Brother    Diabetes Brother    Mental illness Sister    Stroke Daughter    Endometriosis Daughter    Heart disease Son    Breast cancer Maternal Aunt    Diabetes Maternal Grandmother    GER disease Daughter    Esophageal cancer Paternal Grandfather    Colon cancer Neg Hx    Rectal cancer Neg Hx    Stomach cancer Neg Hx    Social History   Socioeconomic History   Marital status: Single    Spouse name: Not on file   Number of children: 4   Years of education: two years of college   Highest education level: GED or equivalent  Occupational History   Occupation: retired  Tobacco Use   Smoking status: Former    Current packs/day: 0.00    Average packs/day: 1  pack/day for 19.0 years (19.0 ttl pk-yrs)    Types: Cigarettes    Start date: 36    Quit date: 2012    Years since quitting: 12.8   Smokeless tobacco: Never   Tobacco comments:    quit 6 years ago  Vaping Use   Vaping status: Never Used  Substance and Sexual Activity   Alcohol use: Yes    Alcohol/week: 1.0 standard drink of alcohol    Types: 1 Glasses of wine per week    Comment: twice a month   Drug  use: Never   Sexual activity: Not Currently    Partners: Male  Other Topics Concern   Not on file  Social History Narrative   Single. Lived in [place] area, moved to [place] to be close to her daughters 2018. Lives with her daughter.   Some college, worked as Sports coach. Retired.    Social drinker. Former smoker.   Exercises routinely.    Wears dentures.    Drinks caffeine, takes a daily vitamin.   Smoke alarm in the home. Wears her seatbelt.    Feels safe in her relationships.   3-4 cups per day.   Four children (2 biological, 2 she raised as her own).   Social Determinants of Health   Financial Resource Strain: Low Risk  (09/07/2023)   Overall Financial Resource Strain (CARDIA)    Difficulty of Paying Living Expenses: Not hard at all  Food Insecurity: No Food Insecurity (09/07/2023)   Hunger Vital Sign    Worried About Running Out of Food in the Last Year: Never true    Ran Out of Food in the Last Year: Never true  Transportation Needs: No Transportation Needs (09/07/2023)   PRAPARE - Administrator, Civil Service (Medical): No    Lack of Transportation (Non-Medical): No  Physical Activity: Sufficiently Active (09/07/2023)   Exercise Vital Sign    Days of Exercise per Week: 5 days    Minutes of Exercise per Session: 40 min  Stress: No Stress Concern Present (09/07/2023)   [employer] of Occupational Health - Occupational Stress Questionnaire    Feeling of Stress : Not at all  Social Connections: Unknown (09/07/2023)   Social Connection and Isolation  Panel [NHANES]    Frequency of Communication with Friends and Family: More than three times a week    Frequency of Social Gatherings with Friends and Family: Three times a week    Attends Religious Services: Not on file    Active Member of Clubs or Organizations: No    Attends Banker Meetings: More than 4 times per year    Marital Status: Not on file    Tobacco Counseling Counseling given: Not Answered Tobacco comments: quit 6 years ago   Clinical Intake:  Pre-visit preparation completed: Yes  Pain : No/denies pain     Diabetes: No  How often do you need to have someone help you when you read instructions, pamphlets, or other written materials from your doctor or pharmacy?: 1 - Never  Interpreter Needed?: No  Information entered by :: Remi Haggard LPN   Activities of Daily Living    09/07/2023    2:20 PM  In your present state of health, do you have any difficulty performing the following activities:  Hearing? 1  Vision? 0  Difficulty concentrating or making decisions? 0  Walking or climbing stairs? 0  Dressing or bathing? 0  Doing errands, shopping? 0  Preparing Food and eating ? N  Using the Toilet? N  In the past six months, have you accidently leaked urine? Y  Do you have problems with loss of bowel control? N  Managing your Medications? N  Managing your Finances? N  Housekeeping or managing your Housekeeping? N    Patient Care Team: Natalia Leatherwood, DO as PCP - General (Family Medicine) [name], MD as Referring Physician (Neurology) [name] (Dentistry) Hortencia Pilar (Orthodontics) Letitia Caul (Oral Surgery) Basuroski, Desma Mcgregor, MD as Referring Physician (Psychiatry) [name], MD as Consulting Physician (Ophthalmology)  Indicate  any recent Medical Services you may have received from other than [facility] providers in the past year (date may be approximate).     Assessment:   This is a routine wellness examination for  [place].  Hearing/Vision screen Hearing Screening - Comments:: Bilateral hearing aids Vision Screening - Comments:: Up to date [place] Cataract surgery   Goals Addressed             This Visit's Progress    Patient Stated       Get a hobby       Depression Screen    09/07/2023    2:24 PM 05/04/2023    9:49 AM 03/08/2023    1:40 PM 11/17/2022    8:17 AM 09/01/2022    2:56 PM 06/02/2022   10:06 AM 12/16/2021   10:33 AM  PHQ 2/9 Scores  PHQ - 2 Score 0 0 2 0 0 0 0  PHQ- 9 Score 1  7 1   0 0    Fall Risk    09/07/2023    2:14 PM 05/04/2023    9:48 AM 03/08/2023    1:40 PM 11/17/2022    8:17 AM 09/01/2022    2:57 PM  Fall Risk   Falls in the past year? 1 1 1 1 1   Number falls in past yr: 0 0  0 0  Injury with Fall? 0 0 0 0 0  Risk for fall due to :  History of fall(s)   No Fall Risks  Follow up Falls evaluation completed;Education provided;Falls prevention discussed Falls evaluation completed  Falls evaluation completed Falls evaluation completed    MEDICARE RISK AT HOME: Medicare Risk at Home Any stairs in or around the home?: No If so, are there any without handrails?: No Home free of loose throw rugs in walkways, pet beds, electrical cords, etc?: Yes Adequate lighting in your home to reduce risk of falls?: Yes Life alert?: No Use of a cane, walker or w/c?: No Grab bars in the bathroom?: No Shower chair or bench in shower?: No Elevated toilet seat or a handicapped toilet?: No  TIMED UP AND GO:  Was the test performed?  No    Cognitive Function:    01/06/2022   10:07 AM 01/06/2021    8:44 AM 12/19/2019    9:28 AM 07/17/2019   10:41 AM  MMSE - Mini Mental State Exam  Orientation to time 5 5 5 5   Orientation to Place 5 5 5 5   Registration 3 3 3 3   Attention/ Calculation 4 1 5 5   Recall 3 3 3 1   Language- name 2 objects 2 2 2 2   Language- repeat 1 1 1 1   Language- follow 3 step command 3 3 3 3   Language- read & follow direction 1 1 1 1   Write a sentence  1 1 1 1   Copy design 1 1 1 1   Total score 29 26 30 28         09/07/2023    2:22 PM 09/01/2022    2:58 PM 08/06/2020    9:15 AM  6CIT Screen  What Year? 0 points 0 points 0 points  What month? 0 points 0 points 0 points  What time? 0 points 0 points 0 points  Count back from 20 0 points 0 points 0 points  Months in reverse 0 points 0 points 0 points  Repeat phrase 6 points 0 points 0 points  Total Score 6 points 0 points 0 points  Immunizations Immunization History  Administered Date(s) Administered   Fluad Quad(high Dose 65+) 09/16/2020, 07/03/2021   Influenza Split 07/26/2012   Influenza, High Dose Seasonal PF 08/28/2018, 06/13/2019   Influenza, Seasonal, Injecte, Preservative Fre 07/24/2010   PFIZER(Purple Top)SARS-COV-2 Vaccination 11/09/2019, 12/03/2019, 09/16/2020   Pneumococcal Conjugate-13 12/02/2020   Pneumococcal Polysaccharide-23 08/28/2018   Td (Adult),unspecified 10/18/1998   Tdap 02/06/2007, 05/04/2023   Zoster Recombinant(Shingrix) 05/10/2019, 09/10/2019   Zoster, Live 06/16/2011    TDAP status: Up to date  Flu Vaccine status: Due, Education has been provided regarding the importance of this vaccine. Advised may receive this vaccine at local pharmacy or Health Dept. Aware to provide a copy of the vaccination record if obtained from local pharmacy or Health Dept. Verbalized acceptance and understanding.  Pneumococcal vaccine status: Up to date  Covid-19 vaccine status: Information provided on how to obtain vaccines.   Qualifies for Shingles Vaccine? No   Zostavax completed No   Shingrix Completed?: Yes  Screening Tests Health Maintenance  Topic Date Due   Colonoscopy  09/04/2022   INFLUENZA VACCINE  01/16/2024 (Originally 05/19/2023)   MAMMOGRAM  10/17/2023   Medicare Annual Wellness (AWV)  09/06/2024   DTaP/Tdap/Td (3 - Td or Tdap) 05/03/2033   Pneumonia Vaccine 39+ Years old  Completed   DEXA SCAN  Completed   Hepatitis C Screening  Completed    Zoster Vaccines- Shingrix  Completed   HPV VACCINES  Aged Out   COVID-19 Vaccine  Discontinued    Health Maintenance  Health Maintenance Due  Topic Date Due   Colonoscopy  09/04/2022    Colorectal cancer screening: Referral to GI placed  . Pt aware the office will call re: appt.  Mammogram status: Ordered  . Pt provided with contact info and advised to call to schedule appt.   Bone Density status: Ordered  . Pt provided with contact info and advised to call to schedule appt.  Lung Cancer Screening: (Low Dose CT Chest recommended if Age 40-80 years, 20 pack-year currently smoking OR have quit w/in 15years.) does not qualify.   Lung Cancer Screening Referral:   Additional Screening:  Hepatitis C Screening: does not qualify; Completed 2020  Vision Screening: Recommended annual ophthalmology exams for early detection of glaucoma and other disorders of the eye. Is the patient up to date with their annual eye exam?  Yes  Who is the provider or what is the name of the office in which the patient attends annual eye exams? [facility] If pt is not established with a provider, would they like to be referred to a provider to establish care? No .   Dental Screening: Recommended annual dental exams for proper oral hygiene    Community Resource Referral / Chronic Care Management: CRR required this visit?  No   CCM required this visit?  No     Plan:     I have personally reviewed and noted the following in the patient's chart:   Medical and social history Use of alcohol, tobacco or illicit drugs  Current medications and supplements including opioid prescriptions. Patient is not currently taking opioid prescriptions. Functional ability and status Nutritional status Physical activity Advanced directives List of other physicians Hospitalizations, surgeries, and ER visits in previous 12 months Vitals Screenings to include cognitive, depression, and falls Referrals and  appointments  In addition, I have reviewed and discussed with patient certain preventive protocols, quality metrics, and best practice recommendations. A written personalized care plan for preventive services as well as general preventive  health recommendations were provided to patient.     Remi Haggard, LPN   16/07/9603   After Visit Summary: (MyChart) Due to this being a telephonic visit, the after visit summary with patients personalized plan was offered to patient via MyChart   Nurse Notes:

## 2023-09-08 ENCOUNTER — Encounter: Payer: Self-pay | Admitting: Family Medicine

## 2023-09-08 ENCOUNTER — Ambulatory Visit: Payer: Medicare Other | Admitting: Family Medicine

## 2023-09-08 VITALS — BP 148/87 | HR 69 | Wt 167.0 lb

## 2023-09-08 DIAGNOSIS — N952 Postmenopausal atrophic vaginitis: Secondary | ICD-10-CM

## 2023-09-08 DIAGNOSIS — N939 Abnormal uterine and vaginal bleeding, unspecified: Secondary | ICD-10-CM | POA: Diagnosis not present

## 2023-09-08 NOTE — Progress Notes (Signed)
OFFICE VISIT  09/08/2023  CC:  Chief Complaint  Patient presents with   Vaginal Bleeding    Pt states she noticed a very small amount of blood; this morning she noticed a heavy amount of blood after having intercourse the night prior. Pt has not noticed any more blood today.     Patient is a 72 y.o. female who presents for vaginal bleeding.  HPI: Abigail Michael has not had any sexual intercourse in the last 1 year or so. She has a boyfriend now. About 1 week ago she was douching for the first time in a long time and noted a little bit of vaginal blood.  Then last night she had intercourse with her boyfriend for the first time and she noted a more significant amount of blood from her vagina this morning.  Throughout today it has not bled any further. She has no vaginal pain, no abdominal pain, no dysuria, no ankle pain.  On ASA but no DOAC or coumadin.  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 many MRI w/ positive white matter abnormality. MRI reported stable since 2015. mild progressive  Cognitive loss   Obstructive sleep apnea    on prior neuro notes 10/2016; no results.    Osteoporosis    had been on reclast   Post concussion syndrome 10/2013   ongoing issues with concentration, memory loss, lability; EEG completed and normal.    TBI (traumatic brain injury) (HCC) 2014   fall at [facility], hit head off of floor. No bleed, "concussion". Seen neurology since for focus and word finding.    TIA (transient ischemic attack) 11/30/2016   MRI normal. No lateralization. Speech affected and weakness during a hypotensive  episdoe.    Urinary incontinence     Past Surgical History:  Procedure Laterality Date   BACK SURGERY  02/2015   herniated disc L4/5/6   BREAST BIOPSY  1988   biopsy   catract Bilateral 2023   [place]ONOSCOPY     [place]. Around 2015   ESOPHAGOGASTRODUODENOSCOPY  2016   [place]   LAPAROSCOPIC HYSTERECTOMY     partial    SHOULDER ARTHROSCOPY W/ LABRAL REPAIR Left 02/13/2016   left shoulder   Study: cognitive eval  06/23/2016   mild cog-linguistic impairment, difficulty with verbal fluency   Study: EEG   04/12/2015   Studies: EEG completed and normal.    Study: MRI  04/28/2016   Impression: numerous non-enhancing flair hyperintense lesions w/in the brain .Suspicious for demyelinating disease such as lyme or MS. Her Lyme test were negative.     Outpatient Medications Prior to Visit  Medication Sig Dispense Refill   albuterol (VENTOLIN HFA) 108 (90 Base) MCG/ACT inhaler Inhale 2 puffs into the lungs every 6 (six) hours as needed for wheezing or shortness of breath. 8 g 0   amLODipine (NORVASC) 10 MG tablet Take 1 tablet (10 mg total) by mouth daily. 90 tablet 1   aspirin EC 81 MG tablet Take 81 mg by mouth daily.  atorvastatin (LIPITOR) 40 MG tablet Take 1 tablet (40 mg total) by mouth daily. 90 tablet 3   buPROPion (WELLBUTRIN SR) 200 MG 12 hr tablet Take 1 tablet (200 mg total) by mouth 2 (two) times daily. 180 tablet 1   Cholecalciferol (VITAMIN D) 50 MCG (2000 UT) tablet      esomeprazole (NEXIUM) 40 MG capsule TAKE ONE CAPSULE BY MOUTH ONE TIME DAILY AT NOON 90 capsule 1   famotidine (PEPCID) 20 MG tablet Take 1 tablet (20 mg total) by mouth daily. 90 tablet 1   FLUoxetine (PROZAC) 40 MG capsule Take 1 capsule (40 mg total) by mouth daily. 90 capsule 1   folic acid (FOLVITE) 1 MG tablet Take 1 tablet (1 mg total) by mouth daily. 90 tablet 3   levothyroxine (SYNTHROID) 25 MCG tablet Take 1 tablet (25 mcg total) by mouth daily before breakfast. On an empty stomach 90  tablet 3   lisinopril (ZESTRIL) 5 MG tablet Take 0.5 tablets (2.5 mg total) by mouth daily. 45 tablet 1   mirabegron ER (MYRBETRIQ) 50 MG TB24 tablet Take 1 tablet (50 mg total) by mouth daily. 90 tablet 3   No facility-administered medications prior to visit.    Allergies  Allergen Reactions   Bee Venom Anaphylaxis    Other reaction(s): Anaphylaxis   Amoxicillin Dermatitis    Yeast infection Other reaction(s): Dermatitis/Yeast Infection    Review of Systems  As per HPI  PE:    09/08/2023    3:20 PM 05/04/2023    9:57 AM 05/04/2023    9:46 AM  Vitals with BMI  Weight 167 lbs  156 lbs 3 oz  Systolic 148 140 272  Diastolic 87 79 83  Pulse 69  69   132/80  Physical Exam Exam chaperoned by Clinton Gallant Motsinger, CMA  Gen: Alert, well appearing.  Patient is oriented to person, place, time, and situation. ABD: soft, NT, ND, BS normal.  No hepatospenomegaly or mass.  No bruits.   LABS:  Last CBC Lab Results  Component Value Date   WBC 5.0 05/04/2023   HGB 14.3 05/04/2023   HCT 42.7 05/04/2023   MCV 93.1 05/04/2023   MCH 29.6 12/02/2020   RDW 13.3 05/04/2023   PLT 249.0 05/04/2023     Chemistry      Component Value Date/Time   NA 142 05/04/2023 0949   NA 140 06/07/2017 0000   K 4.2 05/04/2023 0949   CL 105 05/04/2023 0949   CO2 28 05/04/2023 0949   BUN 17 05/04/2023 0949   BUN 16 06/07/2017 0000   CREATININE 1.00 05/04/2023 0949   CREATININE 0.99 07/03/2021 1344   GLU 102 09/25/2018 0000      Component Value Date/Time   CALCIUM 10.6 (H) 05/04/2023 0949   CALCIUM 10.3 05/04/2023 0949   ALKPHOS 90 05/04/2023 0949   AST 14 05/04/2023 0949   ALT 18 05/04/2023 0949   BILITOT 0.4 05/04/2023 0949      IMPRESSION AND PLAN:  Vaginal bleeding related to recent resumption of intercourse. 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 Discussed possible vaginal/pelvic exam today and we both agreed that this was not necessary at this time. Reassured  her.  Recommended using lubrication for intercourse.  An After Visit Summary was printed and given to the patient.  FOLLOW UP: As needed  Signed:  Santiago Bumpers, MD           09/08/2023

## 2023-09-23 ENCOUNTER — Encounter: Payer: Self-pay | Admitting: Gastroenterology

## 2023-09-28 ENCOUNTER — Ambulatory Visit: Payer: Medicare Other | Admitting: Psychology

## 2023-09-28 DIAGNOSIS — F4323 Adjustment disorder with mixed anxiety and depressed mood: Secondary | ICD-10-CM | POA: Diagnosis not present

## 2023-09-28 NOTE — Progress Notes (Signed)
[facility] Counselor Initial Adult Exam  Name: Abigail Michael Date: 09/28/2023 MRN: [id number] DOB: 1950-11-22 PCP: Felix Pacini A, DO  Time spent: 11:00am-11:50am    50 minutes  Guardian/Payee:  n/a    Paperwork requested: No   Reason for Visit /Presenting Problem:  Pt present for face-to-face initial assessment update via video.  Pt consents to telehealth video session and is aware of limitations and benefits of virtual sessions.   Location of pt: home Location of therapist: home office.  Pt talked about her relationship with her boyfriend.   They have been dating for a couple of months.   Pt states the relationship has been good.  Pt talked about her daughters.   They are living together and are getting along better than pt expected.   Pt still worries about family and friend issues.  Pt talked about her lease being up in March.  Pt is trying to figure out what to do regarding her living situation.  She may have an option to live with her daughter for a year.  Pt may do this to save money.   Pt has made progress connecting with people and making friends.   She still wants to work on self esteem and sticking up for herself.  Reviewed pt's treatment plan for annual update.  Updated pt's treatment plan  IA.   Pt participated in setting treatment goals.   Plan to meet monthly.  Mental Status Exam: Appearance:   Casual     Behavior:  Appropriate  Motor:  Normal  Speech/Language:   Normal Rate  Affect:  Appropriate  Mood:  normal  Thought process:  normal  Thought content:    WNL  Sensory/Perceptual disturbances:    WNL  Orientation:  oriented to person, place, time/date, and situation  Attention:  Good  Concentration:  Good  Memory:  WNL  Fund of knowledge:   Good  Insight:    Good  Judgment:   Good  Impulse Control:  Good     Reported Symptoms: stress  Risk Assessment: Danger to Self:  No Self-injurious Behavior: No Danger to Others: No Duty to  Warn:no Physical Aggression / Violence:No  Access to Firearms a concern: No  Gang Involvement:No  Patient / guardian was educated about steps to take if suicide or homicide risk level increases between visits: n/a While future psychiatric events cannot be accurately predicted, the patient does not currently require acute inpatient psychiatric care and does not currently meet [facility] involuntary commitment criteria.  Substance Abuse History: Current substance abuse: No     Past Psychiatric History:   Previous psychological history is significant for depression Outpatient Providers:pt has been in therapy in the past. History of Psych Hospitalization: Yes  Psychological Testing:  n/a    Abuse History:  Victim of: No.,  n/a    Report needed: No. Victim of Neglect:No. Perpetrator of  n/a   Witness / Exposure to Domestic Violence: No   Protective Services Involvement: No  Witness to [employer] Violence:  No   Family History:  Family History  Problem Relation Age of Onset   Lung cancer Mother    Alcohol abuse Father    Throat cancer Father    Lung cancer Father    Depression Father    Heart attack Father    Breast cancer Sister    Alcohol abuse Brother    Diabetes Brother    Mental illness Sister    Stroke Daughter    Endometriosis  Daughter    Heart disease Son    Breast cancer Maternal Aunt    Diabetes Maternal Grandmother    GER disease Daughter    Esophageal cancer Paternal Grandfather    Colon cancer Neg Hx    Rectal cancer Neg Hx    Stomach cancer Neg Hx     Living situation: the patient lives alone  Pt grew up with both parents.  Pt is one of 7 children and pt is number 2 in the birth order.   Pt's father was alcoholic.   Pt had alot of responsibility growing up.  Both of pt's parents worked.  Pt helped care for her younger siblings.  Pt is close to her siblings.  Family history of mental health issues:  Pt's father had depression.   No childhood abuse.     Sexual Orientation: Straight  Relationship Status: divorced  Name of spouse / other:n/a If a parent, number of children / ages:pt has two adult daughters.  Support Systems: friends Pt's daughters are a support.  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    She feels lonely at times.      Strengths: Supportive Relationships, Hopefulness, Self Advocate, and Able to Communicate Effectively  Barriers:  none   Legal History: Pending legal issue / charges: The patient has no significant history of legal issues. 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 many MRI w/ positive white matter abnormality. MRI reported stable since 2015. mild progressive  Cognitive loss   Obstructive sleep apnea    on prior neuro notes 10/2016; no results.    Osteoporosis    had been on reclast   Post concussion syndrome 10/2013   ongoing issues with concentration, memory loss, lability; EEG completed and normal.    TBI (traumatic brain injury) (HCC) 2014   fall at [facility], hit head off of floor. No bleed, "concussion". Seen neurology since for focus and word finding.    TIA (transient ischemic attack)  11/30/2016   MRI normal. No lateralization. Speech affected and weakness during a hypotensive episdoe.    Urinary incontinence     Past Surgical History:  Procedure Laterality Date   BACK SURGERY  02/2015   herniated disc L4/5/6   BREAST BIOPSY  1988   biopsy   catract Bilateral 2023   COLONOSCOPY     [place] Around 2015   ESOPHAGOGASTRODUODENOSCOPY  2016   [place]   LAPAROSCOPIC HYSTERECTOMY     partial    SHOULDER ARTHROSCOPY W/ LABRAL REPAIR Left 02/13/2016   left shoulder   Study: cognitive eval  06/23/2016   mild cog-linguistic impairment, difficulty with verbal fluency  Study: EEG   04/12/2015   Studies: EEG completed and normal.    Study: MRI  04/28/2016   Impression: numerous non-enhancing flair hyperintense lesions w/in the brain .Suspicious for demyelinating disease such as lyme or MS. Her Lyme test were negative.     Medications: Current Outpatient Medications  Medication Sig Dispense Refill   albuterol (VENTOLIN HFA) 108 (90 Base) MCG/ACT inhaler Inhale 2 puffs into the lungs every 6 (six) hours as needed for wheezing or shortness of breath. 8 g 0   amLODipine (NORVASC) 10 MG tablet Take 1 tablet (10 mg total) by mouth daily. 90 tablet 1   aspirin EC 81 MG tablet Take 81 mg by mouth daily.      atorvastatin (LIPITOR) 40 MG tablet Take 1 tablet (40 mg total) by mouth daily. 90 tablet 3   buPROPion (WELLBUTRIN SR) 200 MG 12 hr tablet Take 1 tablet (200 mg total) by mouth 2 (two) times daily. 180 tablet 1   Cholecalciferol (VITAMIN D) 50 MCG (2000 UT) tablet      esomeprazole (NEXIUM) 40 MG capsule TAKE ONE CAPSULE BY MOUTH ONE TIME DAILY AT NOON 90 capsule 1   famotidine (PEPCID) 20 MG tablet Take 1 tablet (20 mg total) by mouth daily. 90 tablet 1   FLUoxetine (PROZAC) 40 MG capsule Take 1 capsule (40 mg total) by mouth daily. 90 capsule 1   folic acid (FOLVITE) 1 MG tablet Take 1 tablet (1 mg total) by mouth daily. 90 tablet 3   levothyroxine  (SYNTHROID) 25 MCG tablet Take 1 tablet (25 mcg total) by mouth daily before breakfast. On an empty stomach 90 tablet 3   lisinopril (ZESTRIL) 5 MG tablet Take 0.5 tablets (2.5 mg total) by mouth daily. 45 tablet 1   mirabegron ER (MYRBETRIQ) 50 MG TB24 tablet Take 1 tablet (50 mg total) by mouth daily. 90 tablet 3   No current facility-administered medications for this visit.    Allergies  Allergen Reactions   Bee Venom Anaphylaxis    Other reaction(s): Anaphylaxis   Amoxicillin Dermatitis    Yeast infection Other reaction(s): Dermatitis/Yeast Infection    Diagnoses:  F43.23  Plan of Care: Recommend ongoing therapy.   Pt participated in setting treatment goals.   Plan to meet monthly.  Pt agrees with treatment plan.  Treatment Plan (target date: 09/27/2024) Client Abilities/Strengths  Pt is bright, engaging, and motivated for therapy.  Client Treatment Preferences  Individual therapy.  Client Statement of Needs  Improve copings skills and Improve self esteem.  Symptoms  Depressed or irritable mood. Excessive and/or unrealistic worry that is difficult to control occurring more days than not for at least 6 months about a number of events or activities. Hypervigilance (e.g., feeling constantly on edge, experiencing concentration difficulties, having trouble falling or staying asleep, exhibiting a general state of irritability). Low self-esteem. Problems Addressed  Unipolar Depression, Anxiety Goals 1. Alleviate depressive symptoms and return to previous level of effective functioning. 2. Appropriately grieve the loss in order to normalize mood and to return to previously adaptive level of functioning. Objective Learn and implement behavioral strategies to overcome depression. Target Date: 2024-09-27 Frequency: Monthly  Progress: 60 Modality: individual  Related Interventions Assist the client in developing skills that increase the likelihood of deriving pleasure from behavioral  activation (e.g., assertiveness skills, developing an exercise plan, less internal/more external focus, increased social involvement); reinforce success. Engage the client in "behavioral activation," increasing his/her activity level and contact with sources of reward, while  identifying processes that inhibit activation. use behavioral techniques such as instruction, rehearsal, role-playing, role reversal, as needed, to facilitate activity in the client's daily life; reinforce success. 3. Develop healthy interpersonal relationships that lead to the alleviation and help prevent the relapse of depression. 4. Develop healthy thinking patterns and beliefs about self, others, and the world that lead to the alleviation and help prevent the relapse of depression. 5. Enhance ability to effectively cope with the full variety of life's worries and anxieties. 6. Learn and implement coping skills that result in a reduction of anxiety and worry, and improved daily functioning. Objective Learn and implement problem-solving strategies for realistically addressing worries. Target Date: 2024-09-27 Frequency: Monthly  Progress: 60 Modality: individual  Related Interventions Assign the client a homework exercise in which he/she problem-solves a current problem (see Mastery of Your Anxiety and Worry: Workbook by Elenora Fender and Filbert Schilder or Generalized Anxiety Disorder by Elesa Hacker, and Filbert Schilder); review, reinforce success, and provide corrective feedback toward improvement. Teach the client problem-solving strategies involving specifically defining a problem, generating options for addressing it, evaluating the pros and cons of each option, selecting and implementing an optional action, and reevaluating and refining the action. Objective Learn and implement calming skills to reduce overall anxiety and manage anxiety symptoms. Target Date: 2024-09-27 Frequency: Monthly  Progress: 60 Modality: individual  Related  Interventions Assign the client to read about progressive muscle relaxation and other calming strategies in relevant books or treatment manuals (e.g., Progressive Relaxation Training by Twana First; Mastery of Your Anxiety and Worry: Workbook by Earlie Counts). Assign the client homework each session in which he/she practices relaxation exercises daily, gradually applying them progressively from non-anxiety-provoking to anxiety-provoking situations; review and reinforce success while providing corrective feedback toward improvement. Teach the client calming/relaxation skills (e.g., applied relaxation, progressive muscle relaxation, cue controlled relaxation; mindful breathing; biofeedback) and how to discriminate better between relaxation and tension; teach the client how to apply these skills to his/her daily life. 7. Recognize, accept, and cope with feelings of depression. 8. Reduce overall frequency, intensity, and duration of the anxiety so that daily functioning is not impaired. 9. Resolve the core conflict that is the source of anxiety. 10. Stabilize anxiety level while increasing ability to function on a daily basis. Diagnosis F43.23 Conditions For Discharge Achievement of treatment goals and objectives    Salomon Fick, LCSW

## 2023-10-13 ENCOUNTER — Ambulatory Visit
Admission: RE | Admit: 2023-10-13 | Discharge: 2023-10-13 | Disposition: A | Discharge status: Home / Self Care | Payer: Medicare Other | Source: Ambulatory Visit | Attending: Family Medicine | Admitting: Family Medicine

## 2023-10-13 DIAGNOSIS — Z1231 Encounter for screening mammogram for malignant neoplasm of breast: Secondary | ICD-10-CM | POA: Diagnosis not present

## 2023-10-18 ENCOUNTER — Ambulatory Visit (AMBULATORY_SURGERY_CENTER): Payer: Medicare Other | Admitting: *Deleted

## 2023-10-18 VITALS — Ht 62.0 in | Wt 147.0 lb

## 2023-10-18 DIAGNOSIS — Z860100 Personal history of colon polyps, unspecified: Secondary | ICD-10-CM

## 2023-10-18 MED ORDER — NA SULFATE-K SULFATE-MG SULF 17.5-3.13-1.6 GM/177ML PO SOLN: 1.0000 | Freq: Once | ORAL | 0 refills | Status: AC

## 2023-10-18 NOTE — Progress Notes (Signed)
 Pt's name and DOB verified at the beginning of the pre-visit wit 2 identifiers  Pt denies any difficulty with ambulating,sitting, laying down or rolling side to side  Pt has no issues with ambulation   Pt has no issues moving head neck or swallowing  No egg or soy allergy known to patient   No issues known to pt with past sedation with any surgeries or procedures  Pt denies having issues being intubated  No FH of Malignant Hyperthermia  Pt is not on diet pills or shots  Pt is not on home 02   Pt is not on blood thinners   Pt denies issues with constipation   Pt is not on dialysis  Pt denise any abnormal heart rhythms   Pt denies any upcoming cardiac testing  Pt encouraged to use to use Singlecare or Goodrx to reduce cost   Patient's chart reviewed by Norleen Schillings CNRA prior to pre-visit and patient appropriate for the [facility].  Pre-visit completed and red dot placed by patient's name on their procedure day (on provider's schedule).  .  Visit by phone  Pt states weight is 147 lb  Instructed pt why it is important to and  to call if they have any changes in health or new medications. Directed them to the # given and on instructions.     Instructions reviewed. Pt given both [facility] main # and MD on call # prior to instructions.  Pt states understanding. Instructed to review again prior to procedure. Pt states they will.   Instructions sent by mail with coupon and by My Chart  Coupon sent via text to mobile phone and pt verified they received it

## 2023-10-26 ENCOUNTER — Encounter: Payer: Self-pay | Admitting: Family Medicine

## 2023-10-26 ENCOUNTER — Other Ambulatory Visit: Payer: Self-pay | Admitting: Family Medicine

## 2023-10-26 ENCOUNTER — Ambulatory Visit: Payer: Medicare Other | Admitting: Family Medicine

## 2023-10-26 VITALS — BP 118/72 | HR 70 | Temp 97.6°F | Wt 148.6 lb

## 2023-10-26 DIAGNOSIS — Z23 Encounter for immunization: Secondary | ICD-10-CM

## 2023-10-26 DIAGNOSIS — G3281 Cerebellar ataxia in diseases classified elsewhere: Secondary | ICD-10-CM

## 2023-10-26 DIAGNOSIS — E034 Atrophy of thyroid (acquired): Secondary | ICD-10-CM | POA: Diagnosis not present

## 2023-10-26 DIAGNOSIS — M810 Age-related osteoporosis without current pathological fracture: Secondary | ICD-10-CM

## 2023-10-26 DIAGNOSIS — F419 Anxiety disorder, unspecified: Secondary | ICD-10-CM | POA: Diagnosis not present

## 2023-10-26 DIAGNOSIS — S069X9S Unspecified intracranial injury with loss of consciousness of unspecified duration, sequela: Secondary | ICD-10-CM | POA: Diagnosis not present

## 2023-10-26 DIAGNOSIS — Z5181 Encounter for therapeutic drug level monitoring: Secondary | ICD-10-CM

## 2023-10-26 DIAGNOSIS — E782 Mixed hyperlipidemia: Secondary | ICD-10-CM | POA: Diagnosis not present

## 2023-10-26 DIAGNOSIS — N1831 Chronic kidney disease, stage 3a: Secondary | ICD-10-CM

## 2023-10-26 DIAGNOSIS — N3281 Overactive bladder: Secondary | ICD-10-CM

## 2023-10-26 DIAGNOSIS — K219 Gastro-esophageal reflux disease without esophagitis: Secondary | ICD-10-CM

## 2023-10-26 DIAGNOSIS — F330 Major depressive disorder, recurrent, mild: Secondary | ICD-10-CM | POA: Diagnosis not present

## 2023-10-26 DIAGNOSIS — G3184 Mild cognitive impairment, so stated: Secondary | ICD-10-CM

## 2023-10-26 DIAGNOSIS — Z79899 Other long term (current) drug therapy: Secondary | ICD-10-CM

## 2023-10-26 DIAGNOSIS — I10 Essential (primary) hypertension: Secondary | ICD-10-CM

## 2023-10-26 DIAGNOSIS — E210 Primary hyperparathyroidism: Secondary | ICD-10-CM

## 2023-10-26 MED ORDER — ESOMEPRAZOLE MAGNESIUM 40 MG PO CPDR: DELAYED_RELEASE_CAPSULE | ORAL | 1 refills | Status: DC

## 2023-10-26 MED ORDER — AMLODIPINE BESYLATE 10 MG PO TABS: 10.0000 mg | ORAL_TABLET | Freq: Every day | ORAL | 1 refills | Status: DC

## 2023-10-26 MED ORDER — FLUOXETINE HCL 40 MG PO CAPS: 40.0000 mg | ORAL_CAPSULE | Freq: Every day | ORAL | 1 refills | Status: DC

## 2023-10-26 MED ORDER — BUPROPION HCL ER (SR) 200 MG PO TB12: 200.0000 mg | ORAL_TABLET | Freq: Two times a day (BID) | ORAL | 1 refills | Status: DC

## 2023-10-26 MED ORDER — LISINOPRIL 5 MG PO TABS: 2.5000 mg | ORAL_TABLET | Freq: Every day | ORAL | 1 refills | Status: DC

## 2023-10-26 MED ORDER — FAMOTIDINE 20 MG PO TABS: 20.0000 mg | ORAL_TABLET | Freq: Every day | ORAL | 1 refills | Status: DC

## 2023-10-26 NOTE — Patient Instructions (Addendum)
 Return in about 24 weeks (around 04/11/2024) for Routine chronic condition follow-up.        Great to see you today.  I have refilled the medication(s) we provide.   If labs were collected or images ordered, we will inform you of  results once we have received them and reviewed. We will contact you either by echart message, or telephone call.  Please give ample time to the testing facility, and our office to run,  receive and review results. Please do not call inquiring of results, even if you can see them in your chart. We will contact you as soon as we are able. If it has been over 1 week since the test was completed, and you have not yet heard from us , then please call us .    - echart message- for normal results that have been seen by the patient already.   - telephone call: abnormal results or if patient has not viewed results in their echart.  If a referral to a specialist was entered for you, please call us  in 2 weeks if you have not heard from the specialist office to schedule.

## 2023-10-26 NOTE — Progress Notes (Signed)
 Patient ID: Abigail Michael, female  DOB: 06/16/1951, 73 y.o.   MRN: [id number] Patient Care Team    Relationship Specialty Notifications Start End  Catherine Charlies LABOR, DO PCP - General Family Medicine  01/16/18   Asencion Rush, MD Referring Physician Neurology  08/28/18   Megan  Dentistry  07/17/19   [name]  Orthodontics  07/17/19   Alex Hamilton R  Oral Surgery  07/17/19   Charmel Ivan Hamming, MD Referring Physician Psychiatry  07/17/19    Comment: Neurology  Lavonia Lye, MD Consulting Physician Ophthalmology  12/16/21    Comment: cataract    Chief Complaint  Patient presents with   Depression    Chronic Conditions/illness Management     Subjective: Abigail Michael is a 73 y.o.  female present for follow up cmc Depression with anxiety Patient reports compliance with Wellbutrin  200 mg bid and prozac .  She feels this is wotking well for her  Prior note: Patient reports she is feeling good on Wellbutrin  and prozac .    She is still in a group of online people within her age bracket they get together and go out to eat, go bowling etc. she now has her own apartment and is enjoying decorating it.  She reports she has found some new friends and they go out and dance a few times a week.   She has been referred to a therapist.   Essential hypertension/HLD/morbid obesity/h/o TIA: Pt reports compliance with amlodipine  10 mg QD. Patient denies chest pain, shortness of breath, dizziness or lower extremity edema.  Pt takes a  daily baby ASA. Pt is  prescribed statin. H/O TIA.  Diet: Low sodium Exercise: Patient reports she has become more active getting out walking more routinely and feeling better. RF: HTN, HLD, Obesity, h/o CVA, FHX MI   GERD/PPI:  Patient reports compliance with Nexium  and Pepcid .    .   Hypothyroidism:  Patient reports compliance with 25 mcg levothyroxine  daily.     Overactive bladder/MS-white matter disease/TIA/TBI/ataxia: Patient reports compliance with her  Myrbetriq  and it is working well or her.  She had been prescribed Aricept  10 mg nightly, she has discontinued that medication.   RAD: Pt reports she feels winded with dancing and exercising.This is nre for her, she has been able to perform these activities without feeling winded until she recently had covid. Otherwise she feels well. No chest pain associated.      09/07/2023    2:24 PM 05/04/2023    9:49 AM 03/08/2023    1:40 PM 11/17/2022    8:17 AM 09/01/2022    2:56 PM  Depression screen PHQ 2/9  Decreased Interest 0 0 1 0 0  Down, Depressed, Hopeless 0 0 1 0 0  PHQ - 2 Score 0 0 2 0 0  Altered sleeping 0  1 0   Tired, decreased energy 1  1 1    Change in appetite 0  0 0   Feeling bad or failure about yourself  0  1 0   Trouble concentrating 0  1 0   Moving slowly or fidgety/restless 0  1 0   Suicidal thoughts 0  0 0   PHQ-9 Score 1  7 1    Difficult doing work/chores Not difficult at all  Not difficult at all        05/04/2023    9:49 AM 03/08/2023    1:41 PM 11/17/2022    8:17 AM 06/02/2022   10:06 AM  GAD 7 :  Generalized Anxiety Score  Nervous, Anxious, on Edge 0 1 0 0  Control/stop worrying 0 2 0 0  Worry too much - different things 0 2  0  Trouble relaxing 0 2 0 0  Restless 0 1 0 0  Easily annoyed or irritable 0 2 0 0  Afraid - awful might happen 0 1 0 0  Total GAD 7 Score 0 11  0  Anxiety Difficulty Not difficult at all Somewhat difficult  Not difficult at all          09/07/2023    2:14 PM 05/04/2023    9:48 AM 03/08/2023    1:40 PM 11/17/2022    8:17 AM 09/01/2022    2:57 PM  Fall Risk   Falls in the past year? 1 1 1 1 1   Number falls in past yr: 0 0  0 0  Injury with Fall? 0 0 0 0 0  Risk for fall due to :  History of fall(s)   No Fall Risks  Follow up Falls evaluation completed;Education provided;Falls prevention discussed Falls evaluation completed  Falls evaluation completed Falls evaluation completed     Immunization History  Administered Date(s)  Administered   Fluad Quad(high Dose 65+) 09/16/2020, 07/03/2021   Influenza Split 07/26/2012   Influenza, High Dose Seasonal PF 08/28/2018, 06/13/2019   Influenza, Seasonal, Injecte, Preservative Fre 07/24/2010   Influenza-Unspecified 07/19/2023   PFIZER(Purple Top)SARS-COV-2 Vaccination 11/09/2019, 12/03/2019, 09/16/2020   Pneumococcal Conjugate-13 12/02/2020   Pneumococcal Polysaccharide-23 08/28/2018   Td (Adult),unspecified 10/18/1998   Tdap 02/06/2007, 05/04/2023   Zoster Recombinant(Shingrix) 05/10/2019, 09/10/2019   Zoster, Live 06/16/2011    No results found.  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 many MRI w/ positive white matter abnormality. MRI reported stable since 2015. mild progressive  Cognitive loss   Obstructive sleep apnea    on prior neuro notes 10/2016; no results.    Osteoporosis    had been on reclast    Post concussion syndrome 10/2013   ongoing issues with concentration, memory loss, lability; EEG completed and normal.    TBI (traumatic brain injury) (HCC) 2014   fall at [facility], hit head off of floor. No bleed, concussion. Seen neurology since for focus and word finding.    Thyroid  disease    TIA (transient ischemic attack) 11/30/2016   MRI normal. No lateralization. Speech affected and weakness during a hypotensive episdoe.    Tuberculosis    in her 20's meds for a year   Urinary incontinence    Allergies  Allergen Reactions   Bee Venom Anaphylaxis    Other reaction(s): Anaphylaxis   Amoxicillin Dermatitis    Yeast infection Other reaction(s):  Dermatitis/Yeast Infection   Past Surgical History:  Procedure Laterality Date   BACK SURGERY  02/2015   herniated disc L4/5/6   BREAST BIOPSY  1988   biopsy   catract Bilateral 2023   [place] Around 2015   ESOPHAGO[place]ENOSCOPY  [place]    LAPAROSCOPIC HYSTERECTOMY     partial    SHOULDER ARTHROSCOPY W/ LABRAL REPAIR Left 02/13/2016   left shoulder   Study: cognitive eval  06/23/2016   mild cog-linguistic impairment, difficulty with verbal fluency   Study: EEG   04/12/2015   Studies: EEG completed and normal.    Study: MRI  04/28/2016   Impression: numerous non-enhancing flair hyperintense lesions w/in the brain .Suspicious for demyelinating disease such as lyme or MS. Her Lyme test were negative.    Family History  Problem Relation Age of Onset   Lung cancer Mother    Alcohol abuse Father    Throat cancer Father    Lung cancer Father    Depression Father    Heart attack Father    Breast cancer Sister    Mental illness Sister    Alcohol abuse Brother    Diabetes Brother    Breast cancer Maternal Aunt    Diabetes Maternal Grandmother    Esophageal cancer Paternal Grandfather    Stroke Daughter    Endometriosis Daughter    GER disease Daughter    Heart disease Son    Colon cancer Neg Hx    Rectal cancer Neg Hx    Stomach cancer Neg Hx    Colon polyps Neg Hx    Social History   Socioeconomic History   Marital status: Single    Spouse name: Not on file   Number of children: 4   Years of education: two years of college   Highest education level: GED or equivalent  Occupational History   Occupation: retired  Tobacco Use   Smoking status: Former    Current packs/day: 0.00    Average packs/day: 1 pack/day for 19.0 years (19.0 ttl pk-yrs)    Types: Cigarettes    Start date: 46    Quit date: 2012    Years since quitting: 13.0   Smokeless tobacco: Never   Tobacco comments:    quit 6 years ago  Vaping Use   Vaping  status: Never Used  Substance and Sexual Activity   Alcohol use: Yes    Alcohol/week: 1.0 standard drink of alcohol    Types: 1 Glasses of wine per week    Comment: twice a month   Drug use: Never   Sexual activity: Not Currently    Partners: Male  Other Topics Concern   Not on file  Social History Narrative   Single. Lived in [place] area, moved to [place] to be close to her daughters 2018. Lives with her daughter.   Some college, worked as Sports Coach. Retired.    Social drinker. Former smoker.   Exercises routinely.    Wears dentures.    Drinks caffeine, takes a daily vitamin.   Smoke alarm in the home. Wears her seatbelt.    Feels safe in her relationships.   3-4 cups per day.   Four children (2 biological, 2 she raised as her own).   Social Drivers of Corporate Investment Banker Strain: Low Risk  (09/07/2023)   Overall Financial Resource Strain (CARDIA)    Difficulty of Paying Living Expenses: Not hard at all  Food Insecurity: No Food Insecurity (09/07/2023)   Hunger Vital Sign    Worried About Running Out of Food in the Last Year: Never true    Ran Out of Food in the Last Year: Never true  Transportation Needs: No Transportation Needs (09/07/2023)   PRAPARE - Transportation    Lack of  Transportation (Medical): No    Lack of Transportation (Non-Medical): No  Physical Activity: Sufficiently Active (09/07/2023)   Exercise Vital Sign    Days of Exercise per Week: 5 days    Minutes of Exercise per Session: 40 min  Stress: No Stress Concern Present (09/07/2023)   [employer] of Occupational Health - Occupational Stress Questionnaire    Feeling of Stress : Not at all  Social Connections: Unknown (09/07/2023)   Social Connection and Isolation Panel [NHANES]    Frequency of Communication with Friends and Family: More than three times a week    Frequency of Social Gatherings with Friends and Family: Three times a week    Attends Religious Services: Not on file    Active  Member of Clubs or Organizations: No    Attends Club or Organization Meetings: More than 4 times per year    Marital Status: Not on file  Intimate Partner Violence: Not At Risk (09/07/2023)   Humiliation, Afraid, Rape, and Kick questionnaire    Fear of Current or Ex-Partner: No    Emotionally Abused: No    Physically Abused: No    Sexually Abused: No   Allergies as of 10/26/2023       Reactions   Bee Venom Anaphylaxis   Other reaction(s): Anaphylaxis   Amoxicillin Dermatitis   Yeast infection Other reaction(s): Dermatitis/Yeast Infection        Medication List        Accurate as of October 26, 2023  9:16 AM. If you have any questions, ask your nurse or doctor.          STOP taking these medications    Tdap 5-2.5-18.5 LF-MCG/0.5 injection Commonly known as: BOOSTRIX Stopped by: Charlies Bellini       TAKE these medications    albuterol  108 (90 Base) MCG/ACT inhaler Commonly known as: VENTOLIN  HFA Inhale 2 puffs into the lungs every 6 (six) hours as needed for wheezing or shortness of breath.   amLODipine  10 MG tablet Commonly known as: NORVASC  Take 1 tablet (10 mg total) by mouth daily.   aspirin EC 81 MG tablet Take 81 mg by mouth daily.   atorvastatin  40 MG tablet Commonly known as: LIPITOR Take 1 tablet (40 mg total) by mouth daily.   buPROPion  200 MG 12 hr tablet Commonly known as: Wellbutrin  SR Take 1 tablet (200 mg total) by mouth 2 (two) times daily.   esomeprazole  40 MG capsule Commonly known as: NEXIUM  TAKE ONE CAPSULE BY MOUTH ONE TIME DAILY AT NOON   famotidine  20 MG tablet Commonly known as: PEPCID  Take 1 tablet (20 mg total) by mouth daily.   FLUoxetine  40 MG capsule Commonly known as: PROZAC  Take 1 capsule (40 mg total) by mouth daily.   folic acid  1 MG tablet Commonly known as: FOLVITE  Take 1 tablet (1 mg total) by mouth daily.   levothyroxine  25 MCG tablet Commonly known as: SYNTHROID  Take 1 tablet (25 mcg total) by mouth daily  before breakfast. On an empty stomach   lisinopril  5 MG tablet Commonly known as: ZESTRIL  Take 0.5 tablets (2.5 mg total) by mouth daily.   mirabegron  ER 50 MG Tb24 tablet Commonly known as: Myrbetriq  Take 1 tablet (50 mg total) by mouth daily.   OVER THE COUNTER MEDICATION Neura alpha   Vitamin D  50 MCG (2000 UT) tablet        All past medical history, surgical history, allergies, family history, immunizations andmedications were updated in the EMR today and  reviewed under the history and medication portions of their EMR.     No results found for this or any previous visit (from the past 2160 hours).   Patient was never admitted.   ROS: 14 pt review of systems performed and negative (unless mentioned in an HPI)  Objective: BP 118/72   Pulse 70   Temp 97.6 F (36.4 C)   Wt 148 lb 9.6 oz (67.4 kg)   LMP 10/18/1990   SpO2 98%   BMI 27.18 kg/m  Physical Exam Vitals and nursing note reviewed.  Constitutional:      General: She is not in acute distress.    Appearance: Normal appearance. She is not ill-appearing, toxic-appearing or diaphoretic.  HENT:     Head: Normocephalic and atraumatic.  Eyes:     General: No scleral icterus.       Right eye: No discharge.        Left eye: No discharge.     Extraocular Movements: Extraocular movements intact.     Conjunctiva/sclera: Conjunctivae normal.     Pupils: Pupils are equal, round, and reactive to light.  Cardiovascular:     Rate and Rhythm: Normal rate and regular rhythm.     Heart sounds: No murmur heard.    No gallop.  Pulmonary:     Effort: Pulmonary effort is normal. No respiratory distress.     Breath sounds: Normal breath sounds. No wheezing, rhonchi or rales.  Musculoskeletal:     Right lower leg: No edema.     Left lower leg: No edema.  Skin:    General: Skin is warm and dry.     Coloration: Skin is not jaundiced or pale.     Findings: No erythema or rash.  Neurological:     Mental Status: She is  alert and oriented to person, place, and time. Mental status is at baseline.     Motor: No weakness.     Gait: Gait normal.  Psychiatric:        Mood and Affect: Mood normal.        Behavior: Behavior normal.        Thought Content: Thought content normal.        Judgment: Judgment normal.    Assessment/plan: Abigail Michael is a 73 y.o. female present for Chronic Conditions/illness Management Depression with anxiety stable Continue fluoxetine  40 mg cap Continue  Wellbutrin   200 mg BID - Referral has been placed last visit for replacement,since her provider is no longer practicing.  - Tried in the past: celexa and effexor.   Essential hypertension/morbid obesity/HLD/CKD 3 stable Continue lisinopril  2.5 mg every day today Continue  amlodipine  10 mg daily to [facility]. Continue  atorvastatin  40 mg daily, refills called into [facility]. continue ASA Labs: UTD, due next visit.   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    Traumatic brain injury with loss of consciousness, sequela (HCC)/White matter disease/TIA//ataxia stable Aricept  10 mg qd> managed by neuro> stopped She worked with physical therapy and did really well with improving her gait. Patient is established with neurology.  overactive bladder Stable Continue Myrbetriq  50 mg daily. >  Mail-in prescription  Osteoporosis/hyper PTH/hypercalcemia: Pth/ ca/ vitd UTD  Encounter for monitoring long-term proton pump inhibitor therapy/GERD Stable Continue nexium . Continue Pepcid . 40 Labs:, due next visit. Mildly low folate last collection  Hypothyroidism due to acquired atrophy of thyroid  Continue  levo 25 mcg Tsh due next  visit   Stage 3a chronic kidney disease (HCC) stable Avoid nsaids when able.  Monitor yearly  Hypercalcemia Chronic. hydrate PTH and vitamin DUTD   Return in about 24 weeks (around 04/11/2024) for Routine chronic  condition follow-up.   No orders of the defined types were placed in this encounter.   Meds ordered this encounter  Medications   buPROPion  (WELLBUTRIN  SR) 200 MG 12 hr tablet    Sig: Take 1 tablet (200 mg total) by mouth 2 (two) times daily.    Dispense:  180 tablet    Refill:  1   esomeprazole  (NEXIUM ) 40 MG capsule    Sig: TAKE ONE CAPSULE BY MOUTH ONE TIME DAILY AT NOON    Dispense:  90 capsule    Refill:  1   famotidine  (PEPCID ) 20 MG tablet    Sig: Take 1 tablet (20 mg total) by mouth daily.    Dispense:  90 tablet    Refill:  1   FLUoxetine  (PROZAC ) 40 MG capsule    Sig: Take 1 capsule (40 mg total) by mouth daily.    Dispense:  90 capsule    Refill:  1   lisinopril  (ZESTRIL ) 5 MG tablet    Sig: Take 0.5 tablets (2.5 mg total) by mouth daily.    Dispense:  45 tablet    Refill:  1   amLODipine  (NORVASC ) 10 MG tablet    Sig: Take 1 tablet (10 mg total) by mouth daily.    Dispense:  90 tablet    Refill:  1    Referral Orders  No referral(s) requested today      Note is dictated utilizing voice recognition software. Although note has been proof read prior to signing, occasional typographical errors still can be missed. If any questions arise, please do not hesitate to call for verification.  Electronically signed by: Charlies Bellini, DO [facility] [place]

## 2023-10-27 ENCOUNTER — Telehealth: Payer: Self-pay | Admitting: Gastroenterology

## 2023-10-27 ENCOUNTER — Other Ambulatory Visit: Payer: Self-pay

## 2023-10-27 DIAGNOSIS — Z860100 Personal history of colon polyps, unspecified: Secondary | ICD-10-CM

## 2023-10-27 DIAGNOSIS — Z1211 Encounter for screening for malignant neoplasm of colon: Secondary | ICD-10-CM

## 2023-10-27 MED ORDER — NA SULFATE-K SULFATE-MG SULF 17.5-3.13-1.6 GM/177ML PO SOLN: 1.0000 | Freq: Once | ORAL | 0 refills | Status: AC

## 2023-10-27 NOTE — Telephone Encounter (Signed)
 Patient called stated she still needs her prep medication.

## 2023-10-27 NOTE — Telephone Encounter (Signed)
 Returned call.  Pt request RX for Suprep be sent to [facility] in [place].  RX sent and coupon mailed to patient.

## 2023-11-01 ENCOUNTER — Telehealth: Payer: Self-pay | Admitting: Family Medicine

## 2023-11-01 NOTE — Telephone Encounter (Signed)
 Copied from CRM [id number]. Topic: Clinical - Prescription Issue >> Nov 01, 2023  4:11 PM Joanell B wrote: Reason for CRM: Pt would like to request a callback regarding the generic version of mirabegron  ER (MYRBETRIQ ) 50 MG TB24 tablet . She said she was on the phone with the pharmacy and the medication use to be 85$ now it is 125$

## 2023-11-02 NOTE — Telephone Encounter (Signed)
 Spoke with patient regarding results/recommendations. Pt states pharmacy said the generic is only $5. After reviewing chart, generic was sent in July. Pt will call pharmacy and advise and have them call office if needed.

## 2023-11-09 ENCOUNTER — Ambulatory Visit: Payer: Medicare Other | Admitting: Psychology

## 2023-11-09 DIAGNOSIS — F4323 Adjustment disorder with mixed anxiety and depressed mood: Secondary | ICD-10-CM

## 2023-11-09 NOTE — Progress Notes (Signed)
[facility] Counselor/Therapist Progress Note  Patient ID: Abigail Michael, MRN: [id number],    Date: 11/09/2023  Time Spent: 10:00am-10:55am   55 minutes   Treatment Type: Individual Therapy  Reported Symptoms: stress  Mental Status Exam: Appearance:  Casual     Behavior: Appropriate  Motor: Normal  Speech/Language:  Normal Rate  Affect: Appropriate  Mood: normal  Thought process: normal  Thought content:   WNL  Sensory/Perceptual disturbances:   WNL  Orientation: oriented to person, place, time/date, and situation  Attention: Good  Concentration: Good  Memory: WNL  Fund of knowledge:  Good  Insight:   Good  Judgment:  Good  Impulse Control: Good   Risk Assessment: Danger to Self:  No Self-injurious Behavior: No Danger to Others: No Duty to Warn:no Physical Aggression / Violence:No  Access to Firearms a concern: No  Gang Involvement:No   Subjective: Pt present for face-to-face individual therapy in person.   Pt talked about hurting her back.  Pt states she has been feelings stressed.  Pt is having to make a decision about her living situation.  Pt's youngest daughter is living in her older daughter's house for this year.   Pt has been offered to move into the house with her daughter until the house is sold in a year.   Addressed the pros and cons of moving vs staying in pt's apartment.  Pt's independence is important to her and she can afford to stay in her apartment.    Pt does not want to "live in limbo" for a year and  she wants to maintain her independence.  Pt talked about her relationship with her boyfriend.    She has not heard from him in a couple of weeks and pt is upset about it.  Addressed their interactions and helped pt process her feelings and relationship dynamics.   Pt talked about her relationship with her sister Abigail Michael.   There is distance in their relationship bc of past conflict.  This upsets pt. Pt does not feel she can share her feelings  with her sister bc her sister is so reactive.   Pt talked about her health.  She has to have a colonoscopy.   She is anxious about the prep.   Pt talked about planning a trip to  [place] in February to stay with a friend.  Pt feels like she has too much time on her hands.   She is going to look into volunteer work.  Addressed volunteer options pt may be interested in. Worked on self care strategies. Provided supportive therapy.    Interventions: Cognitive Behavioral Therapy and Insight-Oriented  Diagnosis:  F43.23  Plan of Care: Recommend ongoing therapy.   Pt participated in setting treatment goals.   Plan to meet monthly.  Pt agrees with treatment plan.  Treatment Plan (target date: 09/27/2024) Client Abilities/Strengths  Pt is bright, engaging, and motivated for therapy.  Client Treatment Preferences  Individual therapy.  Client Statement of Needs  Improve copings skills and Improve self esteem.  Symptoms  Depressed or irritable mood. Excessive and/or unrealistic worry that is difficult to control occurring more days than not for at least 6 months about a number of events or activities. Hypervigilance (e.g., feeling constantly on edge, experiencing concentration difficulties, having trouble falling or staying asleep, exhibiting a general state of irritability). Low self-esteem. Problems Addressed  Unipolar Depression, Anxiety Goals 1. Alleviate depressive symptoms and return to previous level of effective functioning. 2. Appropriately grieve the loss  in order to normalize mood and to return to previously adaptive level of functioning. Objective Learn and implement behavioral strategies to overcome depression. Target Date: 2024-09-27 Frequency: Monthly  Progress: 60 Modality: individual  Related Interventions Assist the client in developing skills that increase the likelihood of deriving pleasure from behavioral activation (e.g., assertiveness skills, developing an exercise plan,  less internal/more external focus, increased social involvement); reinforce success. Engage the client in "behavioral activation," increasing his/her activity level and contact with sources of reward, while identifying processes that inhibit activation. use behavioral techniques such as instruction, rehearsal, role-playing, role reversal, as needed, to facilitate activity in the client's daily life; reinforce success. 3. Develop healthy interpersonal relationships that lead to the alleviation and help prevent the relapse of depression. 4. Develop healthy thinking patterns and beliefs about self, others, and the world that lead to the alleviation and help prevent the relapse of depression. 5. Enhance ability to effectively cope with the full variety of life's worries and anxieties. 6. Learn and implement coping skills that result in a reduction of anxiety and worry, and improved daily functioning. Objective Learn and implement problem-solving strategies for realistically addressing worries. Target Date: 2024-09-27 Frequency: Monthly  Progress: 60 Modality: individual  Related Interventions Assign the client a homework exercise in which he/she problem-solves a current problem (see Mastery of Your Anxiety and Worry: Workbook by Elenora Fender and Filbert Schilder or Generalized Anxiety Disorder by Elesa Hacker, and Filbert Schilder); review, reinforce success, and provide corrective feedback toward improvement. Teach the client problem-solving strategies involving specifically defining a problem, generating options for addressing it, evaluating the pros and cons of each option, selecting and implementing an optional action, and reevaluating and refining the action. Objective Learn and implement calming skills to reduce overall anxiety and manage anxiety symptoms. Target Date: 2024-09-27 Frequency: Monthly  Progress: 60 Modality: individual  Related Interventions Assign the client to read about progressive muscle relaxation and  other calming strategies in relevant books or treatment manuals (e.g., Progressive Relaxation Training by Twana First; Mastery of Your Anxiety and Worry: Workbook by Earlie Counts). Assign the client homework each session in which he/she practices relaxation exercises daily, gradually applying them progressively from non-anxiety-provoking to anxiety-provoking situations; review and reinforce success while providing corrective feedback toward improvement. Teach the client calming/relaxation skills (e.g., applied relaxation, progressive muscle relaxation, cue controlled relaxation; mindful breathing; biofeedback) and how to discriminate better between relaxation and tension; teach the client how to apply these skills to his/her daily life. 7. Recognize, accept, and cope with feelings of depression. 8. Reduce overall frequency, intensity, and duration of the anxiety so that daily functioning is not impaired. 9. Resolve the core conflict that is the source of anxiety. 10. Stabilize anxiety level while increasing ability to function on a daily basis. Diagnosis F43.23 Conditions For Discharge Achievement of treatment goals and objectives   Salomon Fick, LCSW

## 2023-11-14 ENCOUNTER — Telehealth: Payer: Self-pay | Admitting: Gastroenterology

## 2023-11-14 NOTE — Telephone Encounter (Signed)
Patient called and stated that she needed s to cancel her appointment for her colonoscopy due to her being sick. Patient stated she will call back to reschedule. Please advise.

## 2023-11-15 ENCOUNTER — Encounter: Payer: Medicare Other | Admitting: Gastroenterology

## 2023-11-18 NOTE — Telephone Encounter (Signed)
Recommendations advised to the patient and she agreed to have EGD and colonoscopy.  ECL was scheduled for 01-19-24 and a preop appointment was scheduled for 01-05-24.  Patient agreed to plan and verbalized understanding.  No further questions.

## 2023-12-07 ENCOUNTER — Ambulatory Visit (INDEPENDENT_AMBULATORY_CARE_PROVIDER_SITE_OTHER): Payer: Medicare Other | Admitting: Psychology

## 2023-12-07 DIAGNOSIS — F4323 Adjustment disorder with mixed anxiety and depressed mood: Secondary | ICD-10-CM | POA: Diagnosis not present

## 2023-12-07 NOTE — Progress Notes (Signed)
[facility] Counselor/Therapist Progress Note  Patient ID: Abigail Michael, MRN: [id number],    Date: 12/07/2023  Time Spent: 10:00am-10:50am   50 minutes   Treatment Type: Individual Therapy  Reported Symptoms: stress  Mental Status Exam: Appearance:  Casual     Behavior: Appropriate  Motor: Normal  Speech/Language:  Normal Rate  Affect: Appropriate  Mood: normal  Thought process: normal  Thought content:   WNL  Sensory/Perceptual disturbances:   WNL  Orientation: oriented to person, place, time/date, and situation  Attention: Good  Concentration: Good  Memory: WNL  Fund of knowledge:  Good  Insight:   Good  Judgment:  Good  Impulse Control: Good   Risk Assessment: Danger to Self:  No Self-injurious Behavior: No Danger to Others: No Duty to Warn:no Physical Aggression / Violence:No  Access to Firearms a concern: No  Gang Involvement:No   Subjective:   Pt present for face-to-face individual therapy via video.  Pt consents to telehealth video session and is aware of limitations and benefits of virtual sessions.  Location of pt: home Location of therapist: home office.  Pt talked about her relationship with her sister Abigail Michael.   The relationship feels uncomfortable and awkward bc of Abigail Michael's past conflict with pt's [name].  Pt wants to talk with Abigail Michael to clear the air but is anxious about potential conflict.   Worked with pt on how she can communicate effectively with Abigail Michael.   Helped pt process her feelings and relationship dynamics.   Pt has made a decision to stay in her apartment for another year and talked to her [name] about it.  Abigail Michael was very supportive of pt and her decision.   Abigail Michael [place] of years that will also accommodate pt to live with them.   Pt is looking forward to that transition.   Pt states she has not heard from the guy she was dating.   Pt states she has made peace  with it and has learned from that relationship to not make herself so vulnerable so soon.   Pt states she has been feeling less anxious in general and is worrying less.   She is getting together a lot with friends.   Worked on self care strategies. Provided supportive therapy.    Interventions: Cognitive Behavioral Therapy and Insight-Oriented  Diagnosis:  F43.23  Plan of Care: Recommend ongoing therapy.   Pt participated in setting treatment goals.   Plan to meet monthly.  Pt agrees with treatment plan.  Treatment Plan (target date: 09/27/2024) Client Abilities/Strengths  Pt is bright, engaging, and motivated for therapy.  Client Treatment Preferences  Individual therapy.  Client Statement of Needs  Improve copings skills and Improve self esteem.  Symptoms  Depressed or irritable mood. Excessive and/or unrealistic worry that is difficult to control occurring more days than not for at least 6 months about a number of events or activities. Hypervigilance (e.g., feeling constantly on edge, experiencing concentration difficulties, having trouble falling or staying asleep, exhibiting a general state of irritability). Low self-esteem. Problems Addressed  Unipolar Depression, Anxiety Goals 1. Alleviate depressive symptoms and return to previous level of effective functioning. 2. Appropriately grieve the loss in order to normalize mood and to return to previously adaptive level of functioning. Objective Learn and implement behavioral strategies to overcome depression. Target Date: 2024-09-27 Frequency: Monthly  Progress: 60 Modality: individual  Related Interventions Assist the client in developing skills that  increase the likelihood of deriving pleasure from behavioral activation (e.g., assertiveness skills, developing an exercise plan, less internal/more external focus, increased social involvement); reinforce success. Engage the client in "behavioral activation," increasing his/her activity  level and contact with sources of reward, while identifying processes that inhibit activation. use behavioral techniques such as instruction, rehearsal, role-playing, role reversal, as needed, to facilitate activity in the client's daily life; reinforce success. 3. Develop healthy interpersonal relationships that lead to the alleviation and help prevent the relapse of depression. 4. Develop healthy thinking patterns and beliefs about self, others, and the world that lead to the alleviation and help prevent the relapse of depression. 5. Enhance ability to effectively cope with the full variety of life's worries and anxieties. 6. Learn and implement coping skills that result in a reduction of anxiety and worry, and improved daily functioning. Objective Learn and implement problem-solving strategies for realistically addressing worries. Target Date: 2024-09-27 Frequency: Monthly  Progress: 60 Modality: individual  Related Interventions Assign the client a homework exercise in which he/she problem-solves a current problem (see Mastery of Your Anxiety and Worry: Workbook by Elenora Fender and Filbert Schilder or Generalized Anxiety Disorder by Elesa Hacker, and Filbert Schilder); review, reinforce success, and provide corrective feedback toward improvement. Teach the client problem-solving strategies involving specifically defining a problem, generating options for addressing it, evaluating the pros and cons of each option, selecting and implementing an optional action, and reevaluating and refining the action. Objective Learn and implement calming skills to reduce overall anxiety and manage anxiety symptoms. Target Date: 2024-09-27 Frequency: Monthly  Progress: 60 Modality: individual  Related Interventions Assign the client to read about progressive muscle relaxation and other calming strategies in relevant books or treatment manuals (e.g., Progressive Relaxation Training by Twana First; Mastery of Your Anxiety and  Worry: Workbook by Earlie Counts). Assign the client homework each session in which he/she practices relaxation exercises daily, gradually applying them progressively from non-anxiety-provoking to anxiety-provoking situations; review and reinforce success while providing corrective feedback toward improvement. Teach the client calming/relaxation skills (e.g., applied relaxation, progressive muscle relaxation, cue controlled relaxation; mindful breathing; biofeedback) and how to discriminate better between relaxation and tension; teach the client how to apply these skills to his/her daily life. 7. Recognize, accept, and cope with feelings of depression. 8. Reduce overall frequency, intensity, and duration of the anxiety so that daily functioning is not impaired. 9. Resolve the core conflict that is the source of anxiety. 10. Stabilize anxiety level while increasing ability to function on a daily basis. Diagnosis F43.23 Conditions For Discharge Achievement of treatment goals and objectives   Salomon Fick, LCSW

## 2023-12-27 ENCOUNTER — Other Ambulatory Visit: Payer: Self-pay | Admitting: Family Medicine

## 2024-01-04 ENCOUNTER — Ambulatory Visit: Payer: Medicare Other | Admitting: Psychology

## 2024-01-04 DIAGNOSIS — F4323 Adjustment disorder with mixed anxiety and depressed mood: Secondary | ICD-10-CM

## 2024-01-04 NOTE — Progress Notes (Signed)
 [facility] Counselor/Therapist Progress Note  Patient ID: Emika Tiano, MRN: [id number],    Date: 01/04/2024  Time Spent: 11:00am-11:50am   50 minutes   Treatment Type: Individual Therapy  Reported Symptoms: stress  Mental Status Exam: Appearance:  Casual     Behavior: Appropriate  Motor: Normal  Speech/Language:  Normal Rate  Affect: Appropriate  Mood: normal  Thought process: normal  Thought content:   WNL  Sensory/Perceptual disturbances:   WNL  Orientation: oriented to person, place, time/date, and situation  Attention: Good  Concentration: Good  Memory: WNL  Fund of knowledge:  Good  Insight:   Good  Judgment:  Good  Impulse Control: Good   Risk Assessment: Danger to Self:  No Self-injurious Behavior: No Danger to Others: No Duty to Warn:no Physical Aggression / Violence:No  Access to Firearms a concern: No  Gang Involvement:No   Subjective:   Pt present for face-to-face individual therapy in person. Pt talked about her relationship with her sister [name].   Pt is traveling tomorrow to stay with her sister and she is nervous about it bc of their past interactions.  Addressed how pt can handle communication if issues come up about pt's daughter [name].   Helped pt process her feelings and relationship dynamics.   Pt talked about her daughter [name].  [name] is expanding her business and is looking for property to build a beach house that will accommodate pt.    Pt states she has been feeling less anxious in general and is worrying less.   She is getting together a lot with friends.   Worked on self care strategies.  Pt states she is working on being good to herself.  She is learning to be more in charge of her life and be ok with saying no.   Provided supportive therapy.    Interventions: Cognitive Behavioral Therapy and Insight-Oriented  Diagnosis:  F43.23  Plan of Care: Recommend ongoing therapy.   Pt participated in setting treatment  goals.   Plan to meet monthly.  Pt agrees with treatment plan.  Treatment Plan (target date: 09/27/2024) Client Abilities/Strengths  Pt is bright, engaging, and motivated for therapy.  Client Treatment Preferences  Individual therapy.  Client Statement of Needs  Improve copings skills and Improve self esteem.  Symptoms  Depressed or irritable mood. Excessive and/or unrealistic worry that is difficult to control occurring more days than not for at least 6 months about a number of events or activities. Hypervigilance (e.g., feeling constantly on edge, experiencing concentration difficulties, having trouble falling or staying asleep, exhibiting a general state of irritability). Low self-esteem. Problems Addressed  Unipolar Depression, Anxiety Goals 1. Alleviate depressive symptoms and return to previous level of effective functioning. 2. Appropriately grieve the loss in order to normalize mood and to return to previously adaptive level of functioning. Objective Learn and implement behavioral strategies to overcome depression. Target Date: 2024-09-27 Frequency: Monthly  Progress: 60 Modality: individual  Related Interventions Assist the client in developing skills that increase the likelihood of deriving pleasure from behavioral activation (e.g., assertiveness skills, developing an exercise plan, less internal/more external focus, increased social involvement); reinforce success. Engage the client in "behavioral activation," increasing his/her activity level and contact with sources of reward, while identifying processes that inhibit activation. use behavioral techniques such as instruction, rehearsal, role-playing, role reversal, as needed, to facilitate activity in the client's daily life; reinforce success. 3. Develop healthy interpersonal relationships that lead to the alleviation and help prevent the relapse  of depression. 4. Develop healthy thinking patterns and beliefs about self, others,  and the world that lead to the alleviation and help prevent the relapse of depression. 5. Enhance ability to effectively cope with the full variety of life's worries and anxieties. 6. Learn and implement coping skills that result in a reduction of anxiety and worry, and improved daily functioning. Objective Learn and implement problem-solving strategies for realistically addressing worries. Target Date: 2024-09-27 Frequency: Monthly  Progress: 60 Modality: individual  Related Interventions Assign the client a homework exercise in which he/she problem-solves a current problem (see Mastery of Your Anxiety and Worry: Workbook by Elenora Fender and Filbert Schilder or Generalized Anxiety Disorder by Elesa Hacker, and Filbert Schilder); review, reinforce success, and provide corrective feedback toward improvement. Teach the client problem-solving strategies involving specifically defining a problem, generating options for addressing it, evaluating the pros and cons of each option, selecting and implementing an optional action, and reevaluating and refining the action. Objective Learn and implement calming skills to reduce overall anxiety and manage anxiety symptoms. Target Date: 2024-09-27 Frequency: Monthly  Progress: 60 Modality: individual  Related Interventions Assign the client to read about progressive muscle relaxation and other calming strategies in relevant books or treatment manuals (e.g., Progressive Relaxation Training by Twana First; Mastery of Your Anxiety and Worry: Workbook by Earlie Counts). Assign the client homework each session in which he/she practices relaxation exercises daily, gradually applying them progressively from non-anxiety-provoking to anxiety-provoking situations; review and reinforce success while providing corrective feedback toward improvement. Teach the client calming/relaxation skills (e.g., applied relaxation, progressive muscle relaxation, cue controlled relaxation; mindful  breathing; biofeedback) and how to discriminate better between relaxation and tension; teach the client how to apply these skills to his/her daily life. 7. Recognize, accept, and cope with feelings of depression. 8. Reduce overall frequency, intensity, and duration of the anxiety so that daily functioning is not impaired. 9. Resolve the core conflict that is the source of anxiety. 10. Stabilize anxiety level while increasing ability to function on a daily basis. Diagnosis F43.23 Conditions For Discharge Achievement of treatment goals and objectives   Salomon Fick, LCSW

## 2024-01-09 DIAGNOSIS — S20211A Contusion of right front wall of thorax, initial encounter: Secondary | ICD-10-CM | POA: Diagnosis not present

## 2024-01-09 DIAGNOSIS — W19XXXA Unspecified fall, initial encounter: Secondary | ICD-10-CM | POA: Diagnosis not present

## 2024-01-09 DIAGNOSIS — S20219A Contusion of unspecified front wall of thorax, initial encounter: Secondary | ICD-10-CM | POA: Diagnosis not present

## 2024-01-09 DIAGNOSIS — S0993XA Unspecified injury of face, initial encounter: Secondary | ICD-10-CM | POA: Diagnosis not present

## 2024-01-09 DIAGNOSIS — S0083XA Contusion of other part of head, initial encounter: Secondary | ICD-10-CM | POA: Diagnosis not present

## 2024-01-09 DIAGNOSIS — R404 Transient alteration of awareness: Secondary | ICD-10-CM | POA: Diagnosis not present

## 2024-01-09 DIAGNOSIS — S199XXA Unspecified injury of neck, initial encounter: Secondary | ICD-10-CM | POA: Diagnosis not present

## 2024-01-09 DIAGNOSIS — I10 Essential (primary) hypertension: Secondary | ICD-10-CM | POA: Diagnosis not present

## 2024-01-09 DIAGNOSIS — S0990XA Unspecified injury of head, initial encounter: Secondary | ICD-10-CM | POA: Diagnosis not present

## 2024-01-09 DIAGNOSIS — R0781 Pleurodynia: Secondary | ICD-10-CM | POA: Diagnosis not present

## 2024-01-11 ENCOUNTER — Telehealth: Payer: Self-pay

## 2024-01-11 NOTE — Transitions of Care (Post Inpatient/ED Visit) (Signed)
 01/11/2024  Name: Abigail Michael MRN: [id number] DOB: [birth year]  Today's TOC FU Call Status: Today's TOC FU Call Status:: Successful TOC FU Call Completed TOC FU Call Complete Date: 01/11/24 Patient's Name and Date of Birth confirmed.  Transition Care Management Follow-up Telephone Call Date of Discharge: 01/09/24 Discharge Facility: Other ([facility]) Name of Other ([facility]) Discharge Facility: [facility] Type of Discharge: Emergency Department Reason for ED Visit: Other: (Fall) How have you been since you were released from the hospital?: Better Any questions or concerns?: No  Items Reviewed: Did you receive and understand the discharge instructions provided?: Yes Medications obtained,verified, and reconciled?: Yes (Medications Reviewed) Any new allergies since your discharge?: No Dietary orders reviewed?: NA Do you have support at home?: Yes People in Home: other relative(s), sibling(s)  Medications Reviewed Today: Medications Reviewed Today     Reviewed by Anthoney Harada, LPN (Licensed Practical Nurse) on 01/11/24 at 1231  Med List Status: <None>   Medication Order Taking? Sig Documenting Provider Last Dose Status Informant  albuterol (VENTOLIN HFA) 108 (90 Base) MCG/ACT inhaler [id number] Yes INHALE TWO PUFFS BY MOUTH INTO THE LUNGS EVERY SIX HOURS AS NEEDED FOR WHEEZING/SHORTNESS OF BREATH Kuneff, Renee A, DO Taking Active   amLODipine (NORVASC) 10 MG tablet [id number] Yes Take 1 tablet (10 mg total) by mouth daily. Kuneff, Renee A, DO Taking Active   aspirin EC 81 MG tablet [id number] Yes Take 81 mg by mouth daily.  [provider] Taking Active   atorvastatin (LIPITOR) 40 MG tablet [id number] Yes Take 1 tablet (40 mg total) by mouth daily. Kuneff, Renee A, DO Taking Active   buPROPion (WELLBUTRIN SR) 200 MG 12 hr tablet [id number] Yes Take 1 tablet (200 mg total) by mouth 2 (two) times daily. Kuneff, Renee A, DO Taking Active   Cholecalciferol  (VITAMIN D) 50 MCG (2000 UT) tablet [id number] Yes  [provider] Taking Active   esomeprazole (NEXIUM) 40 MG capsule [id number] Yes TAKE ONE CAPSULE BY MOUTH ONE TIME DAILY AT [facility], Renee A, DO Taking Active   famotidine (PEPCID) 20 MG tablet [id number] Yes Take 1 tablet (20 mg total) by mouth daily. Kuneff, Renee A, DO Taking Active   FLUoxetine (PROZAC) 40 MG capsule [id number] Yes Take 1 capsule (40 mg total) by mouth daily. Kuneff, Renee A, DO Taking Active   folic acid (FOLVITE) 1 MG tablet [id number] Yes Take 1 tablet (1 mg total) by mouth daily. Claiborne Billings, Renee A, DO Taking Active   levothyroxine (SYNTHROID) 25 MCG tablet [id number] Yes Take 1 tablet (25 mcg total) by mouth daily before breakfast. On an empty stomach Kuneff, Renee A, DO Taking Active   lisinopril (ZESTRIL) 5 MG tablet [id number] Yes Take 0.5 tablets (2.5 mg total) by mouth daily. Kuneff, Renee A, DO Taking Active   mirabegron ER (MYRBETRIQ) 50 MG TB24 tablet [id number] Yes Take 1 tablet (50 mg total) by mouth daily. Natalia Leatherwood, DO Taking Active   OVER THE COUNTER MEDICATION [id number] Yes Neura alpha [provider] Taking Active             Home Care and Equipment/Supplies: Were Home Health Services Ordered?: NA Any new equipment or medical supplies ordered?: NA  Functional Questionnaire: Do you need assistance with bathing/showering or dressing?: No Do you need assistance with meal preparation?: No Do you need assistance with eating?: No Do you have difficulty maintaining continence: No Do you need assistance with getting out of bed/getting out of a chair/moving?: No Do  you have difficulty managing or taking your medications?: No  Follow up appointments reviewed: PCP Follow-up appointment confirmed?: NA Specialist Hospital Follow-up appointment confirmed?: NA Do you need transportation to your follow-up appointment?: No Do you understand care options if your condition(s) worsen?:  Yes-patient verbalized understanding    SIGNATURE Kandis Fantasia, LPN [facility] Health Advisor [place] [facility] You Are. We Are. One [facility] Direct Dial [phone]

## 2024-01-12 ENCOUNTER — Ambulatory Visit (AMBULATORY_SURGERY_CENTER)

## 2024-01-12 ENCOUNTER — Telehealth: Payer: Self-pay

## 2024-01-12 VITALS — Ht 62.0 in | Wt 148.0 lb

## 2024-01-12 DIAGNOSIS — K219 Gastro-esophageal reflux disease without esophagitis: Secondary | ICD-10-CM

## 2024-01-12 DIAGNOSIS — K22719 Barrett's esophagus with dysplasia, unspecified: Secondary | ICD-10-CM

## 2024-01-12 DIAGNOSIS — K449 Diaphragmatic hernia without obstruction or gangrene: Secondary | ICD-10-CM

## 2024-01-12 DIAGNOSIS — Z860100 Personal history of colon polyps, unspecified: Secondary | ICD-10-CM

## 2024-01-12 NOTE — Telephone Encounter (Signed)
 Hello Dr. Barron Alvine and Jonny Ruiz,  When completing this patient's pre-visit today, patient informed RN she fell on 01/08/24 and hit her head, she can't remember if she lost consciousness.  She was taken to the ED via EMS and states she was dx'd with a concussion and contusion to her face and ribs.  She states she does not have any symptoms at present time.  Please advise if you are okay with proceeding with her endocolon/DBL in the [facility] on 01/19/24.  Thank you, Micheal Sheen

## 2024-01-12 NOTE — Progress Notes (Signed)
 No egg or soy allergy known to patient  No issues known to pt with past sedation with any surgeries or procedures Patient denies ever being told they had issues or difficulty with intubation  No FH of Malignant Hyperthermia Pt is not on diet pills Pt is not on  home 02  Pt is not on blood thinners  Pt has issues with constipation, takes Miralax daily, she was instructed to continue this up until her procedure   No A fib or A flutter Have any cardiac testing pending--No Pt can ambulate  Pt denies use of chewing tobacco Discussed diabetic I weight loss medication holds Discussed NSAID holds Checked BMI Pt instructed to use Singlecare.com or GoodRx for a price reduction on prep  Patient's chart reviewed by Cathlyn Parsons CNRA prior to previsit and patient appropriate for the [facility].  Pre visit completed and red dot placed by patient's name on their procedure day (on provider's schedule).    During previsit appt, patient informed RN she fell on 01/08/24 and hit her head, she can't remember if she lost consciousness.  She was taken to the ED via EMS and states she was dx'd with a concussion and contusion to her face and ribs.  She states she does not have any symptoms at present time.  A separate message sent to Dr. Barron Alvine and Carolynn Comment, CRNA making them aware and asking if she is still cleared for the procedure at [facility]. SChaplin, RN,BSN

## 2024-01-13 ENCOUNTER — Telehealth: Payer: Self-pay | Admitting: *Deleted

## 2024-01-13 NOTE — Telephone Encounter (Signed)
 Dr. Barron Alvine:   This pt is scheduled with you on 4/3 for an endo/colon.  Yesterday she was seen in Pre Visit and the nurse sent me the following info:   When completing this patient's pre-visit today, patient informed RN she fell on 01/08/24 and hit her head, she can't remember if she lost consciousness.  She was taken to the ED via EMS and states she was dx'd with a concussion and contusion to her face and ribs.  She states she does not have any symptoms at present time.  There is no record of this event and none concerning the ED recommendations.  I cannot clear her for care at [facility] without a better understanding of this incident.  Best regards,  Cyndee Brightly

## 2024-01-13 NOTE — Telephone Encounter (Signed)
 Thanks [name],  Dr. Salena Saner, The fall happened when she was on vacation in [place], [place].  Please advise  Thank you, Meggan Dhaliwal

## 2024-01-13 NOTE — Telephone Encounter (Signed)
 Cirigliano, Verlin Dike, DO  [facility] Pre-Visit; Cathlyn Parsons, CRNA1 hour ago (1:42 PM)    I agree with Jonny Ruiz.  There are no records available for review from her recent fall and reported concussion.  Since her procedures (EGD and colonoscopy) are being done for elective screening/surveillance purposes, I think the better part of valor would be canceling the procedures until she has a chance to be seen by her PCM and cleared, particularly with concern for recent concussion.  Please call the patient to let her know that we will be canceling her procedures and can schedule follow-up appointment with me in the office once she has a chance to be seen by her PCP and cleared.  Thanks.    Telephone call to patient, she was made aware of Dr. Frankey Shown recommendations above and verbalized understanding.  She states she will call back and make a follow up OV appt with Dr. Barron Alvine after she see's her PCP for clearance. Procedure cancelled.

## 2024-01-16 ENCOUNTER — Ambulatory Visit: Admitting: Family Medicine

## 2024-01-16 ENCOUNTER — Ambulatory Visit: Payer: Self-pay

## 2024-01-16 NOTE — Progress Notes (Unsigned)
 Records required from ED visit in order to evaluate and clear pt for recent concussion. Rescheduled in 2 days to allow time for records to be received.

## 2024-01-18 ENCOUNTER — Encounter: Payer: Self-pay | Admitting: Family Medicine

## 2024-01-18 ENCOUNTER — Ambulatory Visit: Admitting: Family Medicine

## 2024-01-18 VITALS — BP 130/82 | HR 76 | Temp 98.1°F | Wt 166.0 lb

## 2024-01-18 DIAGNOSIS — S060X1A Concussion with loss of consciousness of 30 minutes or less, initial encounter: Secondary | ICD-10-CM

## 2024-01-18 DIAGNOSIS — W19XXXA Unspecified fall, initial encounter: Secondary | ICD-10-CM

## 2024-01-18 NOTE — Progress Notes (Unsigned)
 Abigail Michael , 31-Jul-1951, 73 y.o., female MRN: [id number] Patient Care Team    Relationship Specialty Notifications Start End  Natalia Leatherwood, DO PCP - General Family Medicine  01/16/18   [name], MD Referring Physician Neurology  08/28/18   [name]  Dentistry  07/17/19   [name]  Orthodontics  07/17/19   [name] R  Oral Surgery  07/17/19   [name], MD Referring Physician Psychiatry  07/17/19    Comment: Neurology  [name], MD Consulting Physician Ophthalmology  12/16/21    Comment: cataract    Chief Complaint  Patient presents with   Concussion    Pt states she fell last week and hit her head; CT and X-ray were done, results still pending. Pt still c/o soreness in rib area, HA on and off.  Pt has taken Tylenol.      Subjective: Loyal Rudy is a 73 y.o. Pt presents for an OV with complaints of *** of *** duration.  Associated symptoms include ***. Running for an Benedetto Goad-  Pt has tried *** to ease their symptoms.      01/18/2024    9:00 AM 09/07/2023    2:24 PM 05/04/2023    9:49 AM 03/08/2023    1:40 PM 11/17/2022    8:17 AM  Depression screen PHQ 2/9  Decreased Interest 0 0 0 1 0  Down, Depressed, Hopeless 0 0 0 1 0  PHQ - 2 Score 0 0 0 2 0  Altered sleeping 0 0  1 0  Tired, decreased energy 0 1  1 1   Change in appetite 0 0  0 0  Feeling bad or failure about yourself  0 0  1 0  Trouble concentrating 0 0  1 0  Moving slowly or fidgety/restless 0 0  1 0  Suicidal thoughts 0 0  0 0  PHQ-9 Score 0 1  7 1   Difficult doing work/chores Not difficult at all Not difficult at all  Not difficult at all     Allergies  Allergen Reactions   Bee Venom Anaphylaxis    Other reaction(s): Anaphylaxis   Amoxicillin Dermatitis    Yeast infection Other reaction(s): Dermatitis/Yeast Infection   Social History   Social History Narrative   Single. Lived in [place] area, moved to [place] to be close to her daughters 2018. Lives with her daughter.    Some college, worked as Sports coach. Retired.    Social drinker. Former smoker.   Exercises routinely.    Wears dentures.    Drinks caffeine, takes a daily vitamin.   Smoke alarm in the home. Wears her seatbelt.    Feels safe in her relationships.   3-4 cups per day.   Four children (2 biological, 2 she raised as her own).   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 many MRI w/ positive white matter abnormality. MRI reported stable since 2015. mild progressive  Cognitive loss   Obstructive  sleep apnea    on prior neuro notes 10/2016; no results.    Osteoporosis    had been on reclast   Post concussion syndrome 10/2013   ongoing issues with concentration, memory loss, lability; EEG completed and normal.    TBI (traumatic brain injury) (HCC) 2014   fall at [facility], hit head off of floor. No bleed, "concussion". Seen neurology since for focus and word finding.    Thyroid disease    TIA (transient ischemic attack) 11/30/2016   MRI normal. No lateralization. Speech affected and weakness during a hypotensive episdoe.    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 Around 2015   ESOPHAGO[place]STRODUODENOSCOPY  2016   [place]   LAPAROSCOPIC HYSTERECTOMY     partial    SHOULDER ARTHROSCOPY W/ LABRAL REPAIR Left 02/13/2016   left shoulder   Study: cognitive eval  06/23/2016   mild  cog-linguistic impairment, difficulty with verbal fluency   Study: EEG   04/12/2015   Studies: EEG completed and normal.    Study: MRI  04/28/2016   Impression: numerous non-enhancing flair hyperintense lesions w/in the brain .Suspicious for demyelinating disease such as lyme or MS. Her Lyme test were negative.    Family History  Problem Relation Age of Onset   Lung cancer Mother    Alcohol abuse Father    Throat cancer Father    Lung cancer Father    Depression Father    Heart attack Father    Breast cancer Sister    Mental illness Sister    Alcohol abuse Brother    Diabetes Brother    Breast cancer Maternal Aunt    Diabetes Maternal Grandmother    Esophageal cancer Paternal Grandfather    Stroke Daughter    Endometriosis Daughter    GER disease Daughter    Heart disease Son    Colon cancer Neg Hx    Rectal cancer Neg Hx    Stomach cancer Neg Hx    Colon polyps Neg Hx    Allergies as of 01/18/2024       Reactions   Bee Venom Anaphylaxis   Other reaction(s): Anaphylaxis   Amoxicillin Dermatitis   Yeast infection Other reaction(s): Dermatitis/Yeast Infection        Medication List        Accurate as of January 18, 2024  9:27 AM. If you have any questions, ask your nurse or doctor.          albuterol 108 (90 Base) MCG/ACT inhaler Commonly known as: VENTOLIN HFA INHALE TWO PUFFS BY MOUTH INTO THE LUNGS EVERY SIX HOURS AS NEEDED FOR WHEEZING/SHORTNESS OF BREATH   amLODipine 10 MG tablet Commonly known as: NORVASC Take 1 tablet (10 mg total) by mouth daily.   aspirin EC 81 MG tablet Take 81 mg by mouth daily.   atorvastatin 40 MG tablet Commonly known as: LIPITOR Take 1 tablet (40 mg total) by mouth daily.   buPROPion 200 MG 12 hr tablet Commonly known as: Wellbutrin SR Take 1 tablet (200 mg total) by mouth 2 (two) times daily.   esomeprazole 40 MG capsule Commonly known as: NEXIUM TAKE ONE CAPSULE BY MOUTH ONE TIME DAILY AT [facility]  famotidine 20 MG  tablet Commonly known as: PEPCID Take 1 tablet (20 mg total) by mouth daily.   FLUoxetine 40 MG capsule Commonly known as: PROZAC Take 1 capsule (40 mg total) by mouth daily.   folic acid 1 MG tablet Commonly known as: FOLVITE Take 1 tablet (1 mg total) by mouth daily.   levothyroxine 25 MCG tablet Commonly known as: SYNTHROID Take 1 tablet (25 mcg total) by mouth daily before breakfast. On an empty stomach   lisinopril 5 MG tablet Commonly known as: ZESTRIL Take 0.5 tablets (2.5 mg total) by mouth daily.   mirabegron ER 50 MG Tb24 tablet Commonly known as: Myrbetriq Take 1 tablet (50 mg total) by mouth daily.   Na Sulfate-K Sulfate-Mg Sulfate concentrate 17.5-3.13-1.6 GM/177ML Soln Commonly known as: SUPREP Take 1 kit by mouth once.   OVER THE COUNTER MEDICATION Neura alpha   Vitamin D 50 MCG (2000 UT) tablet        All past medical history, surgical history, allergies, family history, immunizations andmedications were updated in the EMR today and reviewed under the history and medication portions of their EMR.     ROS Negative, with the exception of above mentioned in HPI   Objective:  BP 130/82   Pulse 76   Temp 98.1 F (36.7 C)   Wt 166 lb (75.3 kg)   LMP 10/18/1990   SpO2 99%   BMI 30.36 kg/m  Body mass index is 30.36 kg/m.  Physical Exam   No results found. No results found. No results found for this or any previous visit (from the past 24 hours).  Assessment/Plan: Abigail Michael is a 73 y.o. female present for OV for  *** Reviewed expectations re: course of current medical issues. Discussed self-management of symptoms. Outlined signs and symptoms indicating need for more acute intervention. Patient verbalized understanding and all questions were answered. Patient received an After-Visit Summary.    No orders of the defined types were placed in this encounter.  No orders of the defined types were placed in this encounter.  Referral  Orders  No referral(s) requested today     Note is dictated utilizing voice recognition software. Although note has been proof read prior to signing, occasional typographical errors still can be missed. If any questions arise, please do not hesitate to call for verification.   electronically signed by:  Felix Pacini, DO  [facility] [place]

## 2024-01-19 ENCOUNTER — Encounter: Payer: Self-pay | Admitting: Family Medicine

## 2024-01-19 ENCOUNTER — Encounter: Payer: Medicare Other | Admitting: Gastroenterology

## 2024-01-19 NOTE — Patient Instructions (Signed)

## 2024-01-22 DIAGNOSIS — R197 Diarrhea, unspecified: Secondary | ICD-10-CM | POA: Diagnosis not present

## 2024-01-22 DIAGNOSIS — R1110 Vomiting, unspecified: Secondary | ICD-10-CM | POA: Diagnosis not present

## 2024-01-23 ENCOUNTER — Telehealth: Payer: Self-pay

## 2024-01-23 NOTE — Telephone Encounter (Signed)
 [facility] Day - Client TELEPHONE ADVICE RECORD AccessNurse Patient Name First: Abigail Last: Michael Gender: Female DOB: December 21, 1950 Age: 73 Y 3 M 10 D Return Phone Number: [phone] (Primary) Address: City/State/Zip: [place] [address] Client [facility] Day - Client Client Site [facility] Day Provider Claiborne Billings, [place] Contact Type Call Who Is Calling Patient / Member / Family / Caregiver Call Type Triage / Clinical Relationship To Patient Self Return Phone Number [phone] (Primary) Chief Complaint WEAKNESS - sudden on one side of face or body Reason for Call Symptomatic / Request for Health Information Initial Comment Caller states had a appt last week , and dx with concusion, diarrhea, and vomiting, has urinated, weakness GOTO Facility Not Listed [address] Translation No Nurse Assessment Nurse: Sandrea Hughs, RN, Marcelino Duster Date/Time (Eastern Time): 01/22/2024 12:53:20 PM Confirm and document reason for call. If symptomatic, describe symptoms. ---Caller states pt had a appt last week. dx with concussion. last pm having diarrheax5-6. vomiting x1, pt fell on 3/24. sibling told pt to call. pt eating and drinking. pt denies pain. last uop= about an hour ago. mouth moist. Does the patient have any new or worsening symptoms? ---Yes Will a triage be completed? ---Yes Related visit to physician within the last 2 weeks? ---Yes Does the PT have any chronic conditions? (i.e. diabetes, asthma, this includes High risk factors for pregnancy, etc.) ---No Is this a behavioral health or substance abuse call? ---No Guidelines Guideline Title Affirmed Question Affirmed Notes Nurse Date/Time Lamount Cohen Time) Concussion (mTBI) Less Than 14 Days Ago Follow-up Call Concussion symptoms are getting Brooks Sailors, RN, Marcelino Duster 01/22/2024 12:57:39 PM Disp. Time Lamount Cohen Time) Disposition Final User 01/22/2024 12:52:08 PM Send to  Urgent Caprice Renshaw 01/22/2024 1:00:12 PM Go to ED Now (or PCP triage) Yes Sandrea Hughs, RN, Marcelino Duster PLEASE NOTE: All timestamps contained within this report are represented as [place] Standard Time. CONFIDENTIALTY NOTICE: This fax transmission is intended only for the addressee. It contains information that is legally privileged, confidential or otherwise protected from use or disclosure. If you are not the intended recipient, you are strictly prohibited from reviewing, disclosing, copying using or disseminating any of this information or taking any action in reliance on or regarding this information. If you have received this fax in error, please notify us immediately by telephone so that we can arrange for its return to [place]. Phone: [phone], Toll-Free: [phone], Fax: [phone] CHRISTINE_YORK July 13, 1951 Page: 2 of 2 CallId: [id number] Final Disposition 01/22/2024 1:00:12 PM Go to ED Now (or PCP triage) Justus Memory, RN, Lavon Paganini Disagree/Comply Comply Caller Understands Yes PreDisposition Did not know what to do Care Advice Given Per Guideline GO TO ED/UCC NOW (OR PCP TRIAGE): CARE ADVICE given per Concussion (mTBI) Less Than 14 Days Ago Follow-up Call (Adult) guideline. Referrals GO TO FACILITY OTHER - SPECIFY

## 2024-01-25 ENCOUNTER — Other Ambulatory Visit: Payer: Self-pay | Admitting: Family Medicine

## 2024-01-25 NOTE — Telephone Encounter (Signed)
 Copied from CRM [id number]. Topic: Clinical - Medication Refill >> Jan 25, 2024  4:03 PM Fredrich Romans wrote: Most Recent Primary Care Visit:  Provider: Felix Pacini A  Department: [facility]  Visit Type: OFFICE VISIT  Date: 01/18/2024  Medication: mirabegron ER (MYRBETRIQ) 50 MG TB24 tablet  Has the patient contacted their pharmacy? Yes (Agent: If no, request that the patient contact the pharmacy for the refill. If patient does not wish to contact the pharmacy document the reason why and proceed with request.) (Agent: If yes, when and what did the pharmacy advise?)  Is this the correct pharmacy for this prescription? Yes If no, delete pharmacy and type the correct one.  This is the patient's preferred pharmacy:  [facility] [address], [place] - [address] [place] [address] Phone: [phone] Fax: [phone]  [id number] [address] [place] - [address] [place] [address] Phone: [phone] Fax: [phone]  [facility] [place], [place] - [facility] AT Portal to Registered Caremark Sites [address] [place] [address] Phone: [phone] Fax: [phone]    Has the prescription been filled recently? Yes  Is the patient out of the medication? No(5 pills left)  Has the patient been seen for an appointment in the last year OR does the patient have an upcoming appointment? Yes  Can we respond through MyChart? Yes  Agent: Please be advised that Rx refills may take up to 3 business days. We ask that you follow-up with your pharmacy.

## 2024-01-27 ENCOUNTER — Other Ambulatory Visit: Payer: Self-pay | Admitting: Family Medicine

## 2024-01-27 NOTE — Telephone Encounter (Signed)
 Copied from CRM [id number]. Topic: Clinical - Medication Refill >> Jan 27, 2024  1:45 PM Sonny Dandy B wrote: Most Recent Primary Care Visit:  Provider: Felix Pacini A  Department: [facility]  Visit Type: OFFICE VISIT  Date: 01/18/2024  Medication: mirabegron ER (MYRBETRIQ) 50 MG TB24 tablet [  Has the patient contacted their pharmacy? Yes (Agent: If no, request that the patient contact the pharmacy for the refill. If patient does not wish to contact the pharmacy document the reason why and proceed with request.) (Agent: If yes, when and what did the pharmacy advise?)  Is this the correct pharmacy for this prescription? Yes If no, delete pharmacy and type the correct one.  This is the patient's preferred pharmacy:   [facility] - [place], [place] - [facility] AT Portal to Registered Caremark Sites [address] [place] [address] Phone: [phone] Fax: [phone]    Has the prescription been filled recently? Yes  Is the patient out of the medication? Yes  Has the patient been seen for an appointment in the last year OR does the patient have an upcoming appointment? Yes  Can we respond through MyChart? Yes  Agent: Please be advised that Rx refills may take up to 3 business days. We ask that you follow-up with your pharmacy.

## 2024-02-01 ENCOUNTER — Ambulatory Visit (INDEPENDENT_AMBULATORY_CARE_PROVIDER_SITE_OTHER): Payer: Medicare Other | Admitting: Psychology

## 2024-02-01 DIAGNOSIS — F4323 Adjustment disorder with mixed anxiety and depressed mood: Secondary | ICD-10-CM

## 2024-02-01 NOTE — Progress Notes (Signed)
 [facility] Counselor/Therapist Progress Note  Patient ID: Abigail Michael, MRN: [id number],    Date: 02/01/2024  Time Spent: 11:00am-11:50am   50 minutes   Treatment Type: Individual Therapy  Reported Symptoms: stress  Mental Status Exam: Appearance:  Casual     Behavior: Appropriate  Motor: Normal  Speech/Language:  Normal Rate  Affect: Appropriate  Mood: normal  Thought process: normal  Thought content:   WNL  Sensory/Perceptual disturbances:   WNL  Orientation: oriented to person, place, time/date, and situation  Attention: Good  Concentration: Good  Memory: WNL  Fund of knowledge:  Good  Insight:   Good  Judgment:  Good  Impulse Control: Good   Risk Assessment: Danger to Self:  No Self-injurious Behavior: No Danger to Others: No Duty to Warn:no Physical Aggression / Violence:No  Access to Firearms a concern: No  Gang Involvement:No   Subjective:   Pt present for face-to-face individual therapy in person. Pt talked about her relationship with her sister [name].   Pt traveled to see her sister a few weeks ago and the trip went well.   They got along well so pt was relieved.  Pt did fall while she was there and the ambulance took pt to the hospital.  Pt had a concussion and was bruised.   She is ok now.  She has learned to not rush and to take her time since her balance can be off at times.   Pt talked about feeling angry.  She is feeling lonely and has not been going out with friends as much.  Pt has been thinking about where she would / should be in her life now and is disappointed she does not have a husband.   Meeting people gets tiring. Pt is angry at the things she has lost such as her sports car, her house in [place].  Pt is angry at how [facility] treated Sherryll Burger.  Pt was tearful as she talked about how she feels and feeling sad and lonely.   Pt misses having someone to take care of at times and wants to have a husband.  Helped pt process her  feelings.   Pt can tend to be hard on herself for feeling the way she feels.   Worked on self compassion.  Pt talked about her daughter [name].  [name] is at the beach a lot and pt feels it would help her mood to go to the beach for a few days.   Worked on self care strategies.   Provided supportive therapy.    Interventions: Cognitive Behavioral Therapy and Insight-Oriented  Diagnosis:  F43.23  Plan of Care: Recommend ongoing therapy.   Pt participated in setting treatment goals.   Plan to meet monthly.  Pt agrees with treatment plan.  Treatment Plan (target date: 09/27/2024) Client Abilities/Strengths  Pt is bright, engaging, and motivated for therapy.  Client Treatment Preferences  Individual therapy.  Client Statement of Needs  Improve copings skills and Improve self esteem.  Symptoms  Depressed or irritable mood. Excessive and/or unrealistic worry that is difficult to control occurring more days than not for at least 6 months about a number of events or activities. Hypervigilance (e.g., feeling constantly on edge, experiencing concentration difficulties, having trouble falling or staying asleep, exhibiting a general state of irritability). Low self-esteem. Problems Addressed  Unipolar Depression, Anxiety Goals 1. Alleviate depressive symptoms and return to previous level of effective functioning. 2. Appropriately grieve the loss in order to normalize mood and to  return to previously adaptive level of functioning. Objective Learn and implement behavioral strategies to overcome depression. Target Date: 2024-09-27 Frequency: Monthly  Progress: 60 Modality: individual  Related Interventions Assist the client in developing skills that increase the likelihood of deriving pleasure from behavioral activation (e.g., assertiveness skills, developing an exercise plan, less internal/more external focus, increased social involvement); reinforce success. Engage the client in "behavioral  activation," increasing his/her activity level and contact with sources of reward, while identifying processes that inhibit activation. use behavioral techniques such as instruction, rehearsal, role-playing, role reversal, as needed, to facilitate activity in the client's daily life; reinforce success. 3. Develop healthy interpersonal relationships that lead to the alleviation and help prevent the relapse of depression. 4. Develop healthy thinking patterns and beliefs about self, others, and the world that lead to the alleviation and help prevent the relapse of depression. 5. Enhance ability to effectively cope with the full variety of life's worries and anxieties. 6. Learn and implement coping skills that result in a reduction of anxiety and worry, and improved daily functioning. Objective Learn and implement problem-solving strategies for realistically addressing worries. Target Date: 2024-09-27 Frequency: Monthly  Progress: 60 Modality: individual  Related Interventions Assign the client a homework exercise in which he/she problem-solves a current problem (see Mastery of Your Anxiety and Worry: Workbook by Colbert Dates and Edna Gouty or Generalized Anxiety Disorder by Woodson He, and Edna Gouty); review, reinforce success, and provide corrective feedback toward improvement. Teach the client problem-solving strategies involving specifically defining a problem, generating options for addressing it, evaluating the pros and cons of each option, selecting and implementing an optional action, and reevaluating and refining the action. Objective Learn and implement calming skills to reduce overall anxiety and manage anxiety symptoms. Target Date: 2024-09-27 Frequency: Monthly  Progress: 60 Modality: individual  Related Interventions Assign the client to read about progressive muscle relaxation and other calming strategies in relevant books or treatment manuals (e.g., Progressive Relaxation Training by Juleen Oakland; Mastery of Your Anxiety and Worry: Workbook by Rodney Clamp). Assign the client homework each session in which he/she practices relaxation exercises daily, gradually applying them progressively from non-anxiety-provoking to anxiety-provoking situations; review and reinforce success while providing corrective feedback toward improvement. Teach the client calming/relaxation skills (e.g., applied relaxation, progressive muscle relaxation, cue controlled relaxation; mindful breathing; biofeedback) and how to discriminate better between relaxation and tension; teach the client how to apply these skills to his/her daily life. 7. Recognize, accept, and cope with feelings of depression. 8. Reduce overall frequency, intensity, and duration of the anxiety so that daily functioning is not impaired. 9. Resolve the core conflict that is the source of anxiety. 10. Stabilize anxiety level while increasing ability to function on a daily basis. Diagnosis F43.23 Conditions For Discharge Achievement of treatment goals and objectives   Willey Harrier, LCSW

## 2024-02-02 ENCOUNTER — Other Ambulatory Visit: Payer: Self-pay

## 2024-02-02 ENCOUNTER — Telehealth: Payer: Self-pay

## 2024-02-02 MED ORDER — MIRABEGRON ER 50 MG PO TB24: 50.0000 mg | ORAL_TABLET | Freq: Every day | ORAL | 0 refills | Status: DC

## 2024-02-02 NOTE — Telephone Encounter (Signed)
 Communication  Reason for CRM: patient calling to check on medication refill for mirabegron ER (MYRBETRIQ) 50 MG TB24 tablet    Rx sent.

## 2024-03-07 ENCOUNTER — Ambulatory Visit (INDEPENDENT_AMBULATORY_CARE_PROVIDER_SITE_OTHER): Admitting: Psychology

## 2024-03-07 DIAGNOSIS — F4323 Adjustment disorder with mixed anxiety and depressed mood: Secondary | ICD-10-CM | POA: Diagnosis not present

## 2024-03-07 NOTE — Progress Notes (Signed)
 [facility] Counselor/Therapist Progress Note  Patient ID: Abigail Michael, MRN: [id number],    Date: 03/07/2024  Time Spent: 2:00pm-2:55pm   55 minutes   Treatment Type: Individual Therapy  Reported Symptoms: stress  Mental Status Exam: Appearance:  Casual     Behavior: Appropriate  Motor: Normal  Speech/Language:  Normal Rate  Affect: Appropriate  Mood: normal  Thought process: normal  Thought content:   WNL  Sensory/Perceptual disturbances:   WNL  Orientation: oriented to person, place, time/date, and situation  Attention: Good  Concentration: Good  Memory: WNL  Fund of knowledge:  Good  Insight:   Good  Judgment:  Good  Impulse Control: Good   Risk Assessment: Danger to Self:  No Self-injurious Behavior: No Danger to Others: No Duty to Warn:no Physical Aggression / Violence:No  Access to Firearms a concern: No  Gang Involvement:No   Subjective:   Pt present for face-to-face individual therapy in person. Pt talked about feeling unhappy for awhile.  Pt feel angry a lot.  Pt has been drinking more and does not know when to stop.  Sometimes pt does not know how she got home.  She has had black outs.   Pt's daughter Landy Pinna confronted pt and told pt she is drinking too much and gets mean when she drinks.  Pt then does not remember what she says.  Pt found out her daughter Adell Hones is considering not letting pt move in with them at the beach if she does not take care of her drinking problem.   Pt is worried that she has a drinking problem.  Pt is embarrassed and mad at herself.  Pt's daughters are worried about pt.  Pt has about 3-4 glasses of wine every day even when she is alone.   Pt thinks she drinks bc she is lonely.   Pt states drinking has not been a problem for pt in the past.  The drinking started a couple of years ago.   Pt states she does not want to go to AA.   She has been to [facility] in the past bc her father was alcoholic.   Pt wants to try to  limit her alcohol intake and see if she can do it.  Pt will write down what she drinks and bring it to next session.  Addressed that pt may ultimately end up needing to go to AA and she states she would agree to go if needed.  Pt talked about feeling angry.  She is feeling lonely.  She states she is feeling her age and feels angry about that.  Pt is tired of having to fix everyone elses problems.  Helped pt process her feelings.   Pt can tend to be hard on herself for feeling the way she feels.   Worked on self compassion.   Worked on self care strategies.   Provided supportive therapy.    Interventions: Cognitive Behavioral Therapy and Insight-Oriented  Diagnosis:  F43.23  Plan of Care: Recommend ongoing therapy.   Pt participated in setting treatment goals.   Plan to meet monthly.  Pt agrees with treatment plan.  Treatment Plan (target date: 09/27/2024) Client Abilities/Strengths  Pt is bright, engaging, and motivated for therapy.  Client Treatment Preferences  Individual therapy.  Client Statement of Needs  Improve copings skills and Improve self esteem.  Symptoms  Depressed or irritable mood. Excessive and/or unrealistic worry that is difficult to control occurring more days than not for at least 6 months  about a number of events or activities. Hypervigilance (e.g., feeling constantly on edge, experiencing concentration difficulties, having trouble falling or staying asleep, exhibiting a general state of irritability). Low self-esteem. Problems Addressed  Unipolar Depression, Anxiety Goals 1. Alleviate depressive symptoms and return to previous level of effective functioning. 2. Appropriately grieve the loss in order to normalize mood and to return to previously adaptive level of functioning. Objective Learn and implement behavioral strategies to overcome depression. Target Date: 2024-09-27 Frequency: Monthly  Progress: 60 Modality: individual  Related Interventions Assist the client  in developing skills that increase the likelihood of deriving pleasure from behavioral activation (e.g., assertiveness skills, developing an exercise plan, less internal/more external focus, increased social involvement); reinforce success. Engage the client in "behavioral activation," increasing his/her activity level and contact with sources of reward, while identifying processes that inhibit activation. use behavioral techniques such as instruction, rehearsal, role-playing, role reversal, as needed, to facilitate activity in the client's daily life; reinforce success. 3. Develop healthy interpersonal relationships that lead to the alleviation and help prevent the relapse of depression. 4. Develop healthy thinking patterns and beliefs about self, others, and the world that lead to the alleviation and help prevent the relapse of depression. 5. Enhance ability to effectively cope with the full variety of life's worries and anxieties. 6. Learn and implement coping skills that result in a reduction of anxiety and worry, and improved daily functioning. Objective Learn and implement problem-solving strategies for realistically addressing worries. Target Date: 2024-09-27 Frequency: Monthly  Progress: 60 Modality: individual  Related Interventions Assign the client a homework exercise in which he/she problem-solves a current problem (see Mastery of Your Anxiety and Worry: Workbook by Colbert Dates and Edna Gouty or Generalized Anxiety Disorder by Woodson He, and Edna Gouty); review, reinforce success, and provide corrective feedback toward improvement. Teach the client problem-solving strategies involving specifically defining a problem, generating options for addressing it, evaluating the pros and cons of each option, selecting and implementing an optional action, and reevaluating and refining the action. Objective Learn and implement calming skills to reduce overall anxiety and manage anxiety symptoms. Target Date:  2024-09-27 Frequency: Monthly  Progress: 60 Modality: individual  Related Interventions Assign the client to read about progressive muscle relaxation and other calming strategies in relevant books or treatment manuals (e.g., Progressive Relaxation Training by Juleen Oakland; Mastery of Your Anxiety and Worry: Workbook by Rodney Clamp). Assign the client homework each session in which he/she practices relaxation exercises daily, gradually applying them progressively from non-anxiety-provoking to anxiety-provoking situations; review and reinforce success while providing corrective feedback toward improvement. Teach the client calming/relaxation skills (e.g., applied relaxation, progressive muscle relaxation, cue controlled relaxation; mindful breathing; biofeedback) and how to discriminate better between relaxation and tension; teach the client how to apply these skills to his/her daily life. 7. Recognize, accept, and cope with feelings of depression. 8. Reduce overall frequency, intensity, and duration of the anxiety so that daily functioning is not impaired. 9. Resolve the core conflict that is the source of anxiety. 10. Stabilize anxiety level while increasing ability to function on a daily basis. Diagnosis F43.23 Conditions For Discharge Achievement of treatment goals and objectives   Willey Harrier, LCSW

## 2024-03-15 ENCOUNTER — Other Ambulatory Visit: Payer: Self-pay | Admitting: Family Medicine

## 2024-04-04 ENCOUNTER — Ambulatory Visit: Admitting: Family Medicine

## 2024-04-05 ENCOUNTER — Ambulatory Visit (INDEPENDENT_AMBULATORY_CARE_PROVIDER_SITE_OTHER): Admitting: Psychology

## 2024-04-05 DIAGNOSIS — F4323 Adjustment disorder with mixed anxiety and depressed mood: Secondary | ICD-10-CM

## 2024-04-05 NOTE — Progress Notes (Signed)
 [facility] Counselor/Therapist Progress Note  Patient ID: Abigail Michael, MRN: [id number],    Date: 04/05/2024  Time Spent: 3:00pm-3:50pm   50 minutes   Treatment Type: Individual Therapy  Reported Symptoms: stress  Mental Status Exam: Appearance:  Casual     Behavior: Appropriate  Motor: Normal  Speech/Language:  Normal Rate  Affect: Appropriate  Mood: normal  Thought process: normal  Thought content:   WNL  Sensory/Perceptual disturbances:   WNL  Orientation: oriented to person, place, time/date, and situation  Attention: Good  Concentration: Good  Memory: WNL  Fund of knowledge:  Good  Insight:   Good  Judgment:  Good  Impulse Control: Good   Risk Assessment: Danger to Self:  No Self-injurious Behavior: No Danger to Others: No Duty to Warn:no Physical Aggression / Violence:No  Access to Firearms a concern: No  Gang Involvement:No   Subjective:  Pt present for face-to-face individual therapy via video.  Pt consents to telehealth video session and is aware of limitations and benefits of virtual sessions.  Location of pt: home Location of therapist: home office.  Pt talked about [place].   Pt is anxious about seeing her sister.   Pt still has anger toward her sister for how she treated pt's [name].   The issue occurred several months ago but pt is having trouble letting go.  Addressed pt's anxiety and how she can navigate relationship dynamics.   Pt states she is working on decluttering her apartment.   Pt talked about her drinking.  She has talked to her family about their concerns and told them she is going to work on decreasing her drinking.   Pt would like to keep monitoring her drinking and address her drinking behavior in therapy.   Helped pt process her feelings.   Pt can tend to be hard on herself for feeling the way she feels.   Worked on self compassion.   Pt is working on losing weight.  She states she  gained 20 lbs in the past 6 months and does not feel good about herself.   Pt states her hips have been hurting her a lot.   Worked on self care strategies.   Provided supportive therapy.    Interventions: Cognitive Behavioral Therapy and Insight-Oriented  Diagnosis:  F43.23  Plan of Care: Recommend ongoing therapy.   Pt participated in setting treatment goals.   Plan to meet monthly.  Pt agrees with treatment plan.  Treatment Plan (target date: 09/27/2024) Client Abilities/Strengths  Pt is bright, engaging, and motivated for therapy.  Client Treatment Preferences  Individual therapy.  Client Statement of Needs  Improve copings skills and Improve self esteem.  Symptoms  Depressed or irritable mood. Excessive and/or unrealistic worry that is difficult to control occurring more days than not for at least 6 months about a number of events or activities. Hypervigilance (e.g., feeling constantly on edge, experiencing concentration difficulties, having trouble falling or staying asleep, exhibiting a general state of irritability). Low self-esteem. Problems Addressed  Unipolar Depression, Anxiety Goals 1. Alleviate depressive symptoms and return to previous level of effective functioning. 2. Appropriately grieve the loss in order to normalize mood and to return to previously adaptive level of functioning. Objective Learn and implement behavioral strategies to overcome depression. Target Date: 2024-09-27 Frequency: Monthly  Progress: 60 Modality: individual  Related Interventions Assist the client in developing skills that increase the likelihood of deriving pleasure from behavioral activation (e.g.,  assertiveness skills, developing an exercise plan, less internal/more external focus, increased social involvement); reinforce success. Engage the client in behavioral activation, increasing his/her activity level and contact with sources of reward, while identifying processes that inhibit  activation. use behavioral techniques such as instruction, rehearsal, role-playing, role reversal, as needed, to facilitate activity in the client's daily life; reinforce success. 3. Develop healthy interpersonal relationships that lead to the alleviation and help prevent the relapse of depression. 4. Develop healthy thinking patterns and beliefs about self, others, and the world that lead to the alleviation and help prevent the relapse of depression. 5. Enhance ability to effectively cope with the full variety of life's worries and anxieties. 6. Learn and implement coping skills that result in a reduction of anxiety and worry, and improved daily functioning. Objective Learn and implement problem-solving strategies for realistically addressing worries. Target Date: 2024-09-27 Frequency: Monthly  Progress: 60 Modality: individual  Related Interventions Assign the client a homework exercise in which he/she problem-solves a current problem (see Mastery of Your Anxiety and Worry: Workbook by Colbert Dates and Edna Gouty or Generalized Anxiety Disorder by Woodson He, and Edna Gouty); review, reinforce success, and provide corrective feedback toward improvement. Teach the client problem-solving strategies involving specifically defining a problem, generating options for addressing it, evaluating the pros and cons of each option, selecting and implementing an optional action, and reevaluating and refining the action. Objective Learn and implement calming skills to reduce overall anxiety and manage anxiety symptoms. Target Date: 2024-09-27 Frequency: Monthly  Progress: 60 Modality: individual  Related Interventions Assign the client to read about progressive muscle relaxation and other calming strategies in relevant books or treatment manuals (e.g., Progressive Relaxation Training by Juleen Oakland; Mastery of Your Anxiety and Worry: Workbook by Rodney Clamp). Assign the client homework each session in  which he/she practices relaxation exercises daily, gradually applying them progressively from non-anxiety-provoking to anxiety-provoking situations; review and reinforce success while providing corrective feedback toward improvement. Teach the client calming/relaxation skills (e.g., applied relaxation, progressive muscle relaxation, cue controlled relaxation; mindful breathing; biofeedback) and how to discriminate better between relaxation and tension; teach the client how to apply these skills to his/her daily life. 7. Recognize, accept, and cope with feelings of depression. 8. Reduce overall frequency, intensity, and duration of the anxiety so that daily functioning is not impaired. 9. Resolve the core conflict that is the source of anxiety. 10. Stabilize anxiety level while increasing ability to function on a daily basis. Diagnosis F43.23 Conditions For Discharge Achievement of treatment goals and objectives   Willey Harrier, LCSW

## 2024-04-07 DIAGNOSIS — H6983 Other specified disorders of Eustachian tube, bilateral: Secondary | ICD-10-CM | POA: Diagnosis not present

## 2024-04-07 DIAGNOSIS — H9202 Otalgia, left ear: Secondary | ICD-10-CM | POA: Diagnosis not present

## 2024-04-07 DIAGNOSIS — H6693 Otitis media, unspecified, bilateral: Secondary | ICD-10-CM | POA: Diagnosis not present

## 2024-04-11 ENCOUNTER — Ambulatory Visit: Payer: Medicare Other | Admitting: Family Medicine

## 2024-04-14 ENCOUNTER — Other Ambulatory Visit: Payer: Self-pay | Admitting: Family Medicine

## 2024-04-21 ENCOUNTER — Other Ambulatory Visit: Payer: Self-pay | Admitting: Family Medicine

## 2024-05-02 ENCOUNTER — Ambulatory Visit (INDEPENDENT_AMBULATORY_CARE_PROVIDER_SITE_OTHER): Admitting: Family Medicine

## 2024-05-02 ENCOUNTER — Ambulatory Visit: Payer: Self-pay | Admitting: Family Medicine

## 2024-05-02 ENCOUNTER — Encounter: Payer: Self-pay | Admitting: Family Medicine

## 2024-05-02 ENCOUNTER — Telehealth: Payer: Self-pay

## 2024-05-02 VITALS — BP 128/82 | HR 67 | Temp 98.1°F | Wt 168.0 lb

## 2024-05-02 DIAGNOSIS — E034 Atrophy of thyroid (acquired): Secondary | ICD-10-CM

## 2024-05-02 DIAGNOSIS — I10 Essential (primary) hypertension: Secondary | ICD-10-CM

## 2024-05-02 DIAGNOSIS — E538 Deficiency of other specified B group vitamins: Secondary | ICD-10-CM

## 2024-05-02 DIAGNOSIS — E782 Mixed hyperlipidemia: Secondary | ICD-10-CM | POA: Diagnosis not present

## 2024-05-02 DIAGNOSIS — N1831 Chronic kidney disease, stage 3a: Secondary | ICD-10-CM

## 2024-05-02 DIAGNOSIS — M810 Age-related osteoporosis without current pathological fracture: Secondary | ICD-10-CM

## 2024-05-02 LAB — CBC
HCT: 44.7 % (ref 36.0–46.0)
Hemoglobin: 14.8 g/dL (ref 12.0–15.0)
MCHC: 33.1 g/dL (ref 30.0–36.0)
MCV: 90.7 fl (ref 78.0–100.0)
Platelets: 227 K/uL (ref 150.0–400.0)
RBC: 4.94 Mil/uL (ref 3.87–5.11)
RDW: 13.3 % (ref 11.5–15.5)
WBC: 5.4 K/uL (ref 4.0–10.5)

## 2024-05-02 LAB — LIPID PANEL
Cholesterol: 215 mg/dL — ABNORMAL HIGH (ref 0–200)
HDL: 115.9 mg/dL (ref >39.00)
LDL Cholesterol: 84 mg/dL (ref 0–99)
NonHDL: 99.09
Total CHOL/HDL Ratio: 2
Triglycerides: 76 mg/dL (ref 0.0–149.0)
VLDL: 15.2 mg/dL (ref 0.0–40.0)

## 2024-05-02 LAB — TSH: TSH: 6.08 u[IU]/mL — ABNORMAL HIGH (ref 0.35–5.50)

## 2024-05-02 LAB — B12 AND FOLATE PANEL
Folate: >23.4 ng/mL (ref >5.9)
Vitamin B-12: >1500 pg/mL — ABNORMAL HIGH (ref 211–911)

## 2024-05-02 LAB — COMPREHENSIVE METABOLIC PANEL WITH GFR
ALT: 15 U/L (ref 0–35)
AST: 13 U/L (ref 0–37)
Albumin: 4.6 g/dL (ref 3.5–5.2)
Alkaline Phosphatase: 83 U/L (ref 39–117)
BUN: 12 mg/dL (ref 6–23)
CO2: 28 meq/L (ref 19–32)
Calcium: 10.2 mg/dL (ref 8.4–10.5)
Chloride: 103 meq/L (ref 96–112)
Creatinine, Ser: 0.95 mg/dL (ref 0.40–1.20)
GFR: 59.42 mL/min — ABNORMAL LOW (ref >60.00)
Glucose, Bld: 105 mg/dL — ABNORMAL HIGH (ref 70–99)
Potassium: 4.3 meq/L (ref 3.5–5.1)
Sodium: 138 meq/L (ref 135–145)
Total Bilirubin: 0.5 mg/dL (ref 0.2–1.2)
Total Protein: 6.8 g/dL (ref 6.0–8.3)

## 2024-05-02 MED ORDER — BUPROPION HCL ER (SR) 200 MG PO TB12: 200.0000 mg | ORAL_TABLET | Freq: Two times a day (BID) | ORAL | 1 refills | Status: DC

## 2024-05-02 MED ORDER — FAMOTIDINE 20 MG PO TABS: 20.0000 mg | ORAL_TABLET | Freq: Every day | ORAL | 1 refills | Status: DC

## 2024-05-02 MED ORDER — AMLODIPINE BESYLATE 10 MG PO TABS: 10.0000 mg | ORAL_TABLET | Freq: Every day | ORAL | 1 refills | Status: DC

## 2024-05-02 MED ORDER — LISINOPRIL 5 MG PO TABS: 5.0000 mg | ORAL_TABLET | Freq: Every day | ORAL | 1 refills | Status: DC

## 2024-05-02 MED ORDER — ATORVASTATIN CALCIUM 40 MG PO TABS: 40.0000 mg | ORAL_TABLET | Freq: Every day | ORAL | 3 refills | Status: DC

## 2024-05-02 MED ORDER — MIRABEGRON ER 50 MG PO TB24: 50.0000 mg | ORAL_TABLET | Freq: Every day | ORAL | 1 refills | Status: DC

## 2024-05-02 MED ORDER — ALBUTEROL SULFATE HFA 108 (90 BASE) MCG/ACT IN AERS: INHALATION_SPRAY | RESPIRATORY_TRACT | 11 refills | Status: AC

## 2024-05-02 MED ORDER — ESOMEPRAZOLE MAGNESIUM 40 MG PO CPDR: DELAYED_RELEASE_CAPSULE | ORAL | 1 refills | Status: DC

## 2024-05-02 MED ORDER — LEVOTHYROXINE SODIUM 50 MCG PO TABS: 50.0000 ug | ORAL_TABLET | Freq: Every day | ORAL | 1 refills | Status: DC

## 2024-05-02 MED ORDER — EPINEPHRINE 0.3 MG/0.3ML IJ SOAJ: 0.3000 mg | INTRAMUSCULAR | 0 refills | Status: AC | PRN

## 2024-05-02 MED ORDER — FLUOXETINE HCL 40 MG PO CAPS: 40.0000 mg | ORAL_CAPSULE | Freq: Every day | ORAL | 1 refills | Status: DC

## 2024-05-02 NOTE — Patient Instructions (Signed)

## 2024-05-02 NOTE — Progress Notes (Unsigned)
 Patient ID: Abigail Michael, female  DOB: 02/27/1951, 73 y.o.   MRN: [id number] Patient Care Team    Relationship Specialty Notifications Start End  Catherine Abigail LABOR, DO PCP - General Family Medicine  01/16/18   Asencion Rush, MD Referring Physician Neurology  08/28/18   Megan  Dentistry  07/17/19   [name]  Orthodontics  07/17/19   Alex Hamilton R  Oral Surgery  07/17/19   Charmel Ivan Hamming, MD Referring Physician Psychiatry  07/17/19    Comment: Neurology  Lavonia Lye, MD Consulting Physician Ophthalmology  12/16/21    Comment: cataract    Chief Complaint  Patient presents with   Hypertension    Subjective:*** Abigail Michael is a 73 y.o.  female present for follow up cmc Depression with anxiety Patient reports compliance with Wellbutrin  200 mg bid and prozac .  She feels this is wotking well for her  Prior note: Patient reports she is feeling good on Wellbutrin  and prozac .    She is still in a group of online people within her age bracket they get together and go out to eat, go bowling etc. she now has her own apartment and is enjoying decorating it.  She reports she has found some new friends and they go out and dance a few times a week.   She has been referred to a therapist.   Essential hypertension/HLD/morbid obesity/h/o TIA: Pt reports compliance with amlodipine  10 mg QD. Patient denies chest pain, shortness of breath, dizziness or lower extremity edema.  Pt takes a  daily baby ASA. Pt is  prescribed statin. H/O TIA.  Diet: Low sodium Exercise: Patient reports she has become more active getting out walking more routinely and feeling better. RF: HTN, HLD, Obesity, h/o CVA, FHX MI   GERD/PPI:  Patient reports compliance with Nexium  and Pepcid .    .   Hypothyroidism:  Patient reports compliance with 25 mcg levothyroxine  daily.     Overactive bladder/MS-white matter disease/TIA/TBI/ataxia: Patient reports compliance with her Myrbetriq  and it is working well or  her.  She had been prescribed Aricept  10 mg nightly, she has discontinued that medication.   RAD: Pt reports she feels winded with dancing and exercising.This is nre for her, she has been able to perform these activities without feeling winded until she recently had covid. Otherwise she feels well. No chest pain associated.      05/02/2024   10:44 AM 01/18/2024    9:00 AM 09/07/2023    2:24 PM 05/04/2023    9:49 AM 03/08/2023    1:40 PM  Depression screen PHQ 2/9  Decreased Interest 0 0 0 0 1  Down, Depressed, Hopeless 0 0 0 0 1  PHQ - 2 Score 0 0 0 0 2  Altered sleeping 1 0 0  1  Tired, decreased energy 0 0 1  1  Change in appetite 1 0 0  0  Feeling bad or failure about yourself  0 0 0  1  Trouble concentrating 0 0 0  1  Moving slowly or fidgety/restless 0 0 0  1  Suicidal thoughts 0 0 0  0  PHQ-9 Score 2 0 1  7  Difficult doing work/chores Not difficult at all Not difficult at all Not difficult at all  Not difficult at all      05/02/2024   10:45 AM 01/18/2024    9:00 AM 05/04/2023    9:49 AM 03/08/2023    1:41 PM  GAD 7 : Generalized  Anxiety Score  Nervous, Anxious, on Edge 0 0 0 1  Control/stop worrying 0 0 0 2  Worry too much - different things 0 0 0 2  Trouble relaxing 0 0 0 2  Restless 0 0 0 1  Easily annoyed or irritable 0 0 0 2  Afraid - awful might happen 0 0 0 1  Total GAD 7 Score 0 0 0 11  Anxiety Difficulty Not difficult at all Not difficult at all Not difficult at all Somewhat difficult          05/02/2024   10:44 AM 01/18/2024    9:01 AM 01/18/2024    9:00 AM 09/07/2023    2:14 PM 05/04/2023    9:48 AM  Fall Risk   Falls in the past year? 1 1 0 1 1  Number falls in past yr: 0 0  0 0  Injury with Fall? 1 1  0 0  Risk for fall due to :     History of fall(s)  Follow up Falls evaluation completed Falls evaluation completed;Education provided;Falls prevention discussed Falls evaluation completed Falls evaluation completed;Education provided;Falls prevention  discussed Falls evaluation completed     Immunization History  Administered Date(s) Administered   Fluad Quad(high Dose 65+) 09/16/2020, 07/03/2021   Influenza Split 07/26/2012   Influenza, High Dose Seasonal PF 08/28/2018, 06/13/2019   Influenza, Seasonal, Injecte, Preservative Fre 07/24/2010   Influenza-Unspecified 07/19/2023   PFIZER(Purple Top)SARS-COV-2 Vaccination 11/09/2019, 12/03/2019, 09/16/2020   Pneumococcal Conjugate-13 12/02/2020   Pneumococcal Polysaccharide-23 08/28/2018   Td (Adult),unspecified 10/18/1998   Tdap 02/06/2007, 05/04/2023   Zoster Recombinant(Shingrix) 05/10/2019, 09/10/2019   Zoster, Live 06/16/2011    No results found.  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 many MRI w/ positive white matter abnormality. MRI reported stable since 2015. mild progressive  Cognitive loss   Obstructive sleep apnea    on prior neuro notes 10/2016; no results.    Osteoporosis    had been on reclast    Post concussion syndrome 10/2013   ongoing issues with concentration, memory loss, lability; EEG completed and normal.    TBI (traumatic brain injury) (HCC) 2014   fall at [facility], hit head off of floor. No bleed, concussion. Seen neurology since for focus and word finding.    Thyroid  disease    TIA (transient ischemic attack) 11/30/2016   MRI normal. No lateralization. Speech affected and weakness during a hypotensive episdoe.    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 Around 2015  ESOPHAGO[place]ENOSCOPY  [place]    LAPAROSCOPIC HYSTERECTOMY     partial    SHOULDER ARTHROSCOPY W/ LABRAL REPAIR Left 02/13/2016   left shoulder   Study: cognitive eval  06/23/2016   mild cog-linguistic impairment, difficulty with verbal fluency   Study: EEG   04/12/2015   Studies: EEG completed and normal.    Study: MRI  04/28/2016   Impression: numerous non-enhancing flair hyperintense lesions w/in the brain .Suspicious for demyelinating disease such as lyme or MS. Her Lyme test were negative.    Family History  Problem Relation Age of Onset   Lung cancer Mother    Alcohol abuse Father    Throat cancer Father    Lung cancer Father    Depression Father    Heart attack Father    Breast cancer Sister    Mental illness Sister    Alcohol abuse Brother    Diabetes Brother    Breast cancer Maternal Aunt    Diabetes Maternal Grandmother    Esophageal cancer Paternal Grandfather    Stroke Daughter    Endometriosis Daughter    GER disease Daughter    Heart disease Son    Colon cancer Neg Hx    Rectal cancer Neg Hx    Stomach cancer Neg Hx    Colon polyps Neg Hx    Social History   Socioeconomic History   Marital status: Single    Spouse name: Not on file   Number of children: 4   Years of education: two years of college   Highest education level: GED or equivalent  Occupational History   Occupation: retired  Tobacco Use   Smoking status: Former    Current packs/day: 0.00    Average packs/day: 1 pack/day for 19.0 years (19.0 ttl pk-yrs)    Types: Cigarettes    Start date: 74    Quit date: 2012    Years since quitting: 13.5   Smokeless  tobacco: Never   Tobacco comments:    quit 6 years ago  Vaping Use   Vaping status: Never Used  Substance and Sexual Activity   Alcohol use: Yes    Alcohol/week: 1.0 standard drink of alcohol    Types: 1 Glasses of wine per week    Comment: twice a month   Drug use: Never   Sexual activity: Not Currently    Partners: Male  Other Topics Concern   Not on file  Social History Narrative   Single. Lived in [place] area, moved to [place] to be close to her daughters 2018. Lives with her daughter.   Some college, worked as Sports coach. Retired.    Social drinker. Former smoker.   Exercises routinely.    Wears dentures.    Drinks caffeine, takes a daily vitamin.   Smoke alarm in the home. Wears her seatbelt.    Feels safe in her relationships.   3-4 cups per day.   Four children (2 biological, 2 she raised as her own).   Social Drivers of Corporate investment banker Strain: Low Risk  (09/07/2023)   Overall Financial Resource Strain (CARDIA)    Difficulty of Paying Living Expenses: Not hard at all  Food Insecurity: No Food Insecurity (09/07/2023)   Hunger Vital Sign    Worried About Running Out of Food in the Last Year: Never true    Ran Out of Food in the Last Year: Never true  Transportation Needs: No Transportation Needs (09/07/2023)   PRAPARE - Transportation  Lack of Transportation (Medical): No    Lack of Transportation (Non-Medical): No  Physical Activity: Sufficiently Active (09/07/2023)   Exercise Vital Sign    Days of Exercise per Week: 5 days    Minutes of Exercise per Session: 40 min  Stress: No Stress Concern Present (09/07/2023)   [employer] of Occupational Health - Occupational Stress Questionnaire    Feeling of Stress : Not at all  Social Connections: Unknown (09/07/2023)   Social Connection and Isolation Panel    Frequency of Communication with Friends and Family: More than three times a week    Frequency of Social Gatherings with Friends and Family: Three  times a week    Attends Religious Services: Not on file    Active Member of Clubs or Organizations: No    Attends Club or Organization Meetings: More than 4 times per year    Marital Status: Not on file  Intimate Partner Violence: Not At Risk (09/07/2023)   Humiliation, Afraid, Rape, and Kick questionnaire    Fear of Current or Ex-Partner: No    Emotionally Abused: No    Physically Abused: No    Sexually Abused: No   Allergies as of 05/02/2024       Reactions   Bee Venom Anaphylaxis   Other reaction(s): Anaphylaxis   Amoxicillin Dermatitis   Yeast infection Other reaction(s): Dermatitis/Yeast Infection        Medication List        Accurate as of May 02, 2024 10:51 AM. If you have any questions, ask your nurse or doctor.          albuterol  108 (90 Base) MCG/ACT inhaler Commonly known as: VENTOLIN  HFA INHALE TWO PUFFS BY MOUTH INTO THE LUNGS EVERY SIX HOURS AS NEEDED FOR WHEEZING/SHORTNESS OF BREATH   amLODipine  10 MG tablet Commonly known as: NORVASC  Take 1 tablet (10 mg total) by mouth daily.   aspirin EC 81 MG tablet Take 81 mg by mouth daily.   atorvastatin  40 MG tablet Commonly known as: LIPITOR Take 1 tablet (40 mg total) by mouth daily.   buPROPion  200 MG 12 hr tablet Commonly known as: Wellbutrin  SR Take 1 tablet (200 mg total) by mouth 2 (two) times daily.   esomeprazole  40 MG capsule Commonly known as: NEXIUM  TAKE ONE CAPSULE BY MOUTH ONE TIME DAILY AT NOON   famotidine  20 MG tablet Commonly known as: PEPCID  Take 1 tablet (20 mg total) by mouth daily.   FLUoxetine  40 MG capsule Commonly known as: PROZAC  Take 1 capsule (40 mg total) by mouth daily.   folic acid  1 MG tablet Commonly known as: FOLVITE  Take 1 tablet (1 mg total) by mouth daily.   levothyroxine  25 MCG tablet Commonly known as: SYNTHROID  Take 1 tablet by mouth daily before breakfast on an empty stomach   lisinopril  5 MG tablet Commonly known as: ZESTRIL  Take one-half  tablet (2.5 mg total) by mouth daily.   mirabegron  ER 50 MG Tb24 tablet Commonly known as: Myrbetriq  Take 1 tablet (50 mg total) by mouth daily.   Na Sulfate-K Sulfate-Mg Sulfate concentrate 17.5-3.13-1.6 GM/177ML Soln Commonly known as: SUPREP Take 1 kit by mouth once.   OVER THE COUNTER MEDICATION Neura alpha   Vitamin D  50 MCG (2000 UT) tablet        All past medical history, surgical history, allergies, family history, immunizations andmedications were updated in the EMR today and reviewed under the history and medication portions of their EMR.     No  results found for this or any previous visit (from the past 2160 hours).   Patient was never admitted.   ROS: 14 pt review of systems performed and negative (unless mentioned in an HPI)  Objective: BP 128/82   Pulse 67   Temp 98.1 F (36.7 C)   Wt 168 lb (76.2 kg)   LMP 10/18/1990   SpO2 96%   BMI 30.73 kg/m  Physical Exam Vitals and nursing note reviewed.  Constitutional:      General: She is not in acute distress.    Appearance: Normal appearance. She is not ill-appearing, toxic-appearing or diaphoretic.  HENT:     Head: Normocephalic and atraumatic.  Eyes:     General: No scleral icterus.       Right eye: No discharge.        Left eye: No discharge.     Extraocular Movements: Extraocular movements intact.     Conjunctiva/sclera: Conjunctivae normal.     Pupils: Pupils are equal, round, and reactive to light.  Cardiovascular:     Rate and Rhythm: Normal rate and regular rhythm.     Heart sounds: No murmur heard.    No gallop.  Pulmonary:     Effort: Pulmonary effort is normal. No respiratory distress.     Breath sounds: Normal breath sounds. No wheezing, rhonchi or rales.  Musculoskeletal:     Right lower leg: No edema.     Left lower leg: No edema.  Skin:    General: Skin is warm and dry.     Coloration: Skin is not jaundiced or pale.     Findings: No erythema or rash.  Neurological:     Mental  Status: She is alert and oriented to person, place, and time. Mental status is at baseline.     Motor: No weakness.     Gait: Gait normal.  Psychiatric:        Mood and Affect: Mood normal.        Behavior: Behavior normal.        Thought Content: Thought content normal.        Judgment: Judgment normal.    Assessment/plan: Tamarra Geiselman is a 73 y.o. female present for Chronic Conditions/illness Management Depression with anxiety stable Continue fluoxetine  40 mg cap Continue  Wellbutrin   200 mg BID - Referral has been placed last visit for replacement,since her provider is no longer practicing.  - Tried in the past: celexa and effexor.   Essential hypertension/morbid obesity/HLD/CKD 3 stable Continue lisinopril  2.5 mg every day today Continue  amlodipine  10 mg daily to [facility]. Continue  atorvastatin  40 mg daily, refills called into [facility]. continue ASA Labs: UTD, due next visit.   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    Traumatic brain injury with loss of consciousness, sequela (HCC)/White matter disease/TIA//ataxia stable Aricept  10 mg qd> managed by neuro> stopped She worked with physical therapy and did really well with improving her gait. Patient is established with neurology.  overactive bladder Stable Continue Myrbetriq  50 mg daily. >  Mail-in prescription  Osteoporosis/hyper PTH/hypercalcemia: Pth/ ca/ vitd UTD  Encounter for monitoring long-term proton pump inhibitor therapy/GERD Stable Continue nexium . Continue Pepcid . 40 Labs:, due next visit. Mildly low folate last collection  Hypothyroidism due to acquired atrophy of thyroid  Continue  levo 25 mcg Tsh due next visit   Stage 3a chronic kidney disease (HCC) stable Avoid nsaids when able.  Monitor yearly  Hypercalcemia Chronic. hydrate PTH and vitamin DUTD   No follow-ups on file.   Orders Placed This  Encounter  Procedures   CBC   Comp Met (CMET)   TSH   B12 and Folate Panel   Lipid panel    No orders of the defined types were placed in this encounter.   Referral Orders  No referral(s) requested today      Note is dictated utilizing voice recognition software. Although note has been proof read prior to signing, occasional typographical errors still can be missed. If any questions arise, please do not hesitate to call for verification.  Electronically signed by: Abigail Bellini, DO [facility] [place]

## 2024-05-02 NOTE — Telephone Encounter (Signed)
 Communication  Reason for CRM: Patient was asked to call back and provide the name of the hospital in [facility]  she was admitted in to request records. [facility] Address: [address], [place], [place] [address]    Phone: [phone]   Records requested.

## 2024-05-09 ENCOUNTER — Ambulatory Visit (INDEPENDENT_AMBULATORY_CARE_PROVIDER_SITE_OTHER): Admitting: Psychology

## 2024-05-09 DIAGNOSIS — F4323 Adjustment disorder with mixed anxiety and depressed mood: Secondary | ICD-10-CM

## 2024-05-09 NOTE — Progress Notes (Signed)
 [facility] Counselor/Therapist Progress Note  Patient ID: Abigail Michael, MRN: [id number],    Date: 05/09/2024  Time Spent: 10:00am-10:50am   50 minutes   Treatment Type: Individual Therapy  Reported Symptoms: stress  Mental Status Exam: Appearance:  Casual     Behavior: Appropriate  Motor: Normal  Speech/Language:  Normal Rate  Affect: Appropriate  Mood: normal  Thought process: normal  Thought content:   WNL  Sensory/Perceptual disturbances:   WNL  Orientation: oriented to person, place, time/date, and situation  Attention: Good  Concentration: Good  Memory: WNL  Fund of knowledge:  Good  Insight:   Good  Judgment:  Good  Impulse Control: Good   Risk Assessment: Danger to Self:  No Self-injurious Behavior: No Danger to Others: No Duty to Warn:no Physical Aggression / Violence:No  Access to Firearms a concern: No  Gang Involvement:No   Subjective:  Pt present for face-to-face individual therapy in person.  Pt talked about  her vacation in [place].  The trip was good overall.   However her hips and back hurt a lot from walking on the beach.  Pt had a good time with her grandkids.  Pt stayed with the sister she has had some issues with.  Pt states it was awkward.  Helped pt process her feelings and relationship dynamics.  Pt still feels angry with her sister.   Pt states everything always has to go her sister's way or she gets upset.  Pt is trying work on letting things go so she does not feel as upset.  Pt got to spend time with her brother who lives in [place] .  She has a good relationship with her brother.  Pt talked about her drinking.  She felt good about how she managed her drinking when on vacation.  Pt is still feeling hurt that her daughters confronted her and that [name] won't let pt live with her unless she reduces her drinking.   Pt would like to keep monitoring her drinking and address her drinking behavior in therapy.   Pt has been going out  with some of her friends since she has been back in town.   Worked on self care strategies.   Provided supportive therapy.    Interventions: Cognitive Behavioral Therapy and Insight-Oriented  Diagnosis:  F43.23  Plan of Care: Recommend ongoing therapy.   Pt participated in setting treatment goals.   Plan to meet monthly.  Pt agrees with treatment plan.  Treatment Plan (target date: 09/27/2024) Client Abilities/Strengths  Pt is bright, engaging, and motivated for therapy.  Client Treatment Preferences  Individual therapy.  Client Statement of Needs  Improve copings skills and Improve self esteem.  Symptoms  Depressed or irritable mood. Excessive and/or unrealistic worry that is difficult to control occurring more days than not for at least 6 months about a number of events or activities. Hypervigilance (e.g., feeling constantly on edge, experiencing concentration difficulties, having trouble falling or staying asleep, exhibiting a general state of irritability). Low self-esteem. Problems Addressed  Unipolar Depression, Anxiety Goals 1. Alleviate depressive symptoms and return to previous level of effective functioning. 2. Appropriately grieve the loss in order to normalize mood and to return to previously adaptive level of functioning. Objective Learn and implement behavioral strategies to overcome depression. Target Date: 2024-09-27 Frequency: Monthly  Progress: 60 Modality: individual  Related Interventions Assist the client in developing skills that increase the likelihood of deriving pleasure from behavioral activation (e.g., assertiveness skills, developing an exercise plan, less internal/more  external focus, increased social involvement); reinforce success. Engage the client in behavioral activation, increasing his/her activity level and contact with sources of reward, while identifying processes that inhibit activation. use behavioral techniques such as instruction, rehearsal,  role-playing, role reversal, as needed, to facilitate activity in the client's daily life; reinforce success. 3. Develop healthy interpersonal relationships that lead to the alleviation and help prevent the relapse of depression. 4. Develop healthy thinking patterns and beliefs about self, others, and the world that lead to the alleviation and help prevent the relapse of depression. 5. Enhance ability to effectively cope with the full variety of life's worries and anxieties. 6. Learn and implement coping skills that result in a reduction of anxiety and worry, and improved daily functioning. Objective Learn and implement problem-solving strategies for realistically addressing worries. Target Date: 2024-09-27 Frequency: Monthly  Progress: 60 Modality: individual  Related Interventions Assign the client a homework exercise in which he/she problem-solves a current problem (see Mastery of Your Anxiety and Worry: Workbook by Richarda and Jonne or Generalized Anxiety Disorder by Delores Filler, and Jonne); review, reinforce success, and provide corrective feedback toward improvement. Teach the client problem-solving strategies involving specifically defining a problem, generating options for addressing it, evaluating the pros and cons of each option, selecting and implementing an optional action, and reevaluating and refining the action. Objective Learn and implement calming skills to reduce overall anxiety and manage anxiety symptoms. Target Date: 2024-09-27 Frequency: Monthly  Progress: 60 Modality: individual  Related Interventions Assign the client to read about progressive muscle relaxation and other calming strategies in relevant books or treatment manuals (e.g., Progressive Relaxation Training by Thornell armin Collier; Mastery of Your Anxiety and Worry: Workbook by Richarda armin Jonne). Assign the client homework each session in which he/she practices relaxation exercises daily, gradually applying  them progressively from non-anxiety-provoking to anxiety-provoking situations; review and reinforce success while providing corrective feedback toward improvement. Teach the client calming/relaxation skills (e.g., applied relaxation, progressive muscle relaxation, cue controlled relaxation; mindful breathing; biofeedback) and how to discriminate better between relaxation and tension; teach the client how to apply these skills to his/her daily life. 7. Recognize, accept, and cope with feelings of depression. 8. Reduce overall frequency, intensity, and duration of the anxiety so that daily functioning is not impaired. 9. Resolve the core conflict that is the source of anxiety. 10. Stabilize anxiety level while increasing ability to function on a daily basis. Diagnosis F43.23 Conditions For Discharge Achievement of treatment goals and objectives   Veva Alma, LCSW

## 2024-06-06 ENCOUNTER — Ambulatory Visit (INDEPENDENT_AMBULATORY_CARE_PROVIDER_SITE_OTHER): Admitting: Psychology

## 2024-06-06 DIAGNOSIS — F4323 Adjustment disorder with mixed anxiety and depressed mood: Secondary | ICD-10-CM

## 2024-06-06 NOTE — Progress Notes (Signed)
 [facility] Counselor/Therapist Progress Note  Patient ID: Abigail Michael, MRN: [id number],    Date: 06/06/2024  Time Spent: 11:00am-11:50am   50 minutes   Treatment Type: Individual Therapy  Reported Symptoms: stress  Mental Status Exam: Appearance:  Casual     Behavior: Appropriate  Motor: Normal  Speech/Language:  Normal Rate  Affect: Appropriate  Mood: normal  Thought process: normal  Thought content:   WNL  Sensory/Perceptual disturbances:   WNL  Orientation: oriented to person, place, time/date, and situation  Attention: Good  Concentration: Good  Memory: WNL  Fund of knowledge:  Good  Insight:   Good  Judgment:  Good  Impulse Control: Good   Risk Assessment: Danger to Self:  No Self-injurious Behavior: No Danger to Others: No Duty to Warn:no Physical Aggression / Violence:No  Access to Firearms a concern: No  Gang Involvement:No   Subjective:  Pt present for face-to-face individual therapy via video.  Pt consents to telehealth video session and is aware of limitations and benefits of virtual  Location of pt: home Location of therapist: home office.  Pt talked about having a discussion with her sister Keene.   Pt told Keene she was upset with the way things were handled with pt's daughter Elenor Dragon.  They discussed the situation and pt states Keene sees things differently than pt does.  Pt states Keene is very reactive and this upsets pt.  Helped pt process her feelings and relationship dynamics.  Pt talked about her health.  She is having a lot of hip pain.  She is getting treatment through [facility]. Pt talked about her drinking.  Pt saw both her daughters and they asked about pt's drinking.  Pt told them she has a plan to reduce her drinking and is working on it.   Pt is still feeling hurt that her daughters confronted her and that [name] won't let pt live with her unless she reduces her drinking.   Pt would like to keep monitoring her drinking  and address her drinking behavior in therapy.   Pt has been going out with some of her friends which is good for her.  Worked on self care strategies.   Provided supportive therapy.    Interventions: Cognitive Behavioral Therapy and Insight-Oriented  Diagnosis:  F43.23  Plan of Care: Recommend ongoing therapy.   Pt participated in setting treatment goals.   Plan to meet monthly.  Pt agrees with treatment plan.  Treatment Plan (target date: 09/27/2024) Client Abilities/Strengths  Pt is bright, engaging, and motivated for therapy.  Client Treatment Preferences  Individual therapy.  Client Statement of Needs  Improve copings skills and Improve self esteem.  Symptoms  Depressed or irritable mood. Excessive and/or unrealistic worry that is difficult to control occurring more days than not for at least 6 months about a number of events or activities. Hypervigilance (e.g., feeling constantly on edge, experiencing concentration difficulties, having trouble falling or staying asleep, exhibiting a general state of irritability). Low self-esteem. Problems Addressed  Unipolar Depression, Anxiety Goals 1. Alleviate depressive symptoms and return to previous level of effective functioning. 2. Appropriately grieve the loss in order to normalize mood and to return to previously adaptive level of functioning. Objective Learn and implement behavioral strategies to overcome depression. Target Date: 2024-09-27 Frequency: Monthly  Progress: 60 Modality: individual  Related Interventions Assist the client in developing skills that increase the likelihood of deriving pleasure from behavioral activation (e.g., assertiveness skills, developing an exercise plan, less internal/more external focus,  increased social involvement); reinforce success. Engage the client in behavioral activation, increasing his/her activity level and contact with sources of reward, while identifying processes that inhibit activation.  use behavioral techniques such as instruction, rehearsal, role-playing, role reversal, as needed, to facilitate activity in the client's daily life; reinforce success. 3. Develop healthy interpersonal relationships that lead to the alleviation and help prevent the relapse of depression. 4. Develop healthy thinking patterns and beliefs about self, others, and the world that lead to the alleviation and help prevent the relapse of depression. 5. Enhance ability to effectively cope with the full variety of life's worries and anxieties. 6. Learn and implement coping skills that result in a reduction of anxiety and worry, and improved daily functioning. Objective Learn and implement problem-solving strategies for realistically addressing worries. Target Date: 2024-09-27 Frequency: Monthly  Progress: 60 Modality: individual  Related Interventions Assign the client a homework exercise in which he/she problem-solves a current problem (see Mastery of Your Anxiety and Worry: Workbook by Richarda and Jonne or Generalized Anxiety Disorder by Delores Filler, and Jonne); review, reinforce success, and provide corrective feedback toward improvement. Teach the client problem-solving strategies involving specifically defining a problem, generating options for addressing it, evaluating the pros and cons of each option, selecting and implementing an optional action, and reevaluating and refining the action. Objective Learn and implement calming skills to reduce overall anxiety and manage anxiety symptoms. Target Date: 2024-09-27 Frequency: Monthly  Progress: 60 Modality: individual  Related Interventions Assign the client to read about progressive muscle relaxation and other calming strategies in relevant books or treatment manuals (e.g., Progressive Relaxation Training by Thornell armin Collier; Mastery of Your Anxiety and Worry: Workbook by Richarda armin Jonne). Assign the client homework each session in which he/she  practices relaxation exercises daily, gradually applying them progressively from non-anxiety-provoking to anxiety-provoking situations; review and reinforce success while providing corrective feedback toward improvement. Teach the client calming/relaxation skills (e.g., applied relaxation, progressive muscle relaxation, cue controlled relaxation; mindful breathing; biofeedback) and how to discriminate better between relaxation and tension; teach the client how to apply these skills to his/her daily life. 7. Recognize, accept, and cope with feelings of depression. 8. Reduce overall frequency, intensity, and duration of the anxiety so that daily functioning is not impaired. 9. Resolve the core conflict that is the source of anxiety. 10. Stabilize anxiety level while increasing ability to function on a daily basis. Diagnosis F43.23 Conditions For Discharge Achievement of treatment goals and objectives   Veva Alma, LCSW

## 2024-06-25 ENCOUNTER — Other Ambulatory Visit: Payer: Self-pay | Admitting: Family Medicine

## 2024-06-29 ENCOUNTER — Other Ambulatory Visit: Payer: Self-pay | Admitting: Family Medicine

## 2024-06-29 ENCOUNTER — Other Ambulatory Visit: Payer: Self-pay

## 2024-06-30 ENCOUNTER — Other Ambulatory Visit: Payer: Self-pay | Admitting: Family Medicine

## 2024-07-04 ENCOUNTER — Ambulatory Visit: Admitting: Psychology

## 2024-07-04 DIAGNOSIS — F4323 Adjustment disorder with mixed anxiety and depressed mood: Secondary | ICD-10-CM

## 2024-07-04 NOTE — Progress Notes (Signed)
 [facility] Counselor/Therapist Progress Note  Patient ID: Abigail Michael, MRN: [id number],    Date: 07/04/2024  Time Spent: 11:00am-11:50am   50 minutes   Treatment Type: Individual Therapy  Reported Symptoms: stress  Mental Status Exam: Appearance:  Casual     Behavior: Appropriate  Motor: Normal  Speech/Language:  Normal Rate  Affect: Appropriate  Mood: normal  Thought process: normal  Thought content:   WNL  Sensory/Perceptual disturbances:   WNL  Orientation: oriented to person, place, time/date, and situation  Attention: Good  Concentration: Good  Memory: WNL  Fund of knowledge:  Good  Insight:   Good  Judgment:  Good  Impulse Control: Good   Risk Assessment: Danger to Self:  No Self-injurious Behavior: No Danger to Others: No Duty to Warn:no Physical Aggression / Violence:No  Access to Firearms a concern: No  Gang Involvement:No   Subjective:  Pt present for face-to-face individual therapy in person. Pt talked about having some good talks with her [name].  She told her it bothers her to see pictures of her ex husband.  Abigail Michael was understanding.   Pt talked about her health.  She has been having a lot of hip pain.  She is getting treatment through [facility] and it is starting to help. Pt went to the beach last week and stayed at her daughter [name].   Pt talked about thinking about her future and worrying about what will happen to her.  It hurts her feelings that Abigail Michael won't let pt live with her if she does not reduce her drinking.  Pt states she has been doing better with her drinking and drinking much less.  Pt knows that Abigail Michael and Abigail Michael will take care of pt in the future but she still worries.   Helped pt with worry management and thought reframing.  Pt would like to keep monitoring her drinking and address her drinking behavior in therapy.   Pt has been going out with some of her friends which is good for her.   Worked on self care strategies.   Provided supportive therapy.    Interventions: Cognitive Behavioral Therapy and Insight-Oriented  Diagnosis:  F43.23  Plan of Care: Recommend ongoing therapy.   Pt participated in setting treatment goals.   Plan to meet monthly.  Pt agrees with treatment plan.  Treatment Plan (target date: 09/27/2024) Client Abilities/Strengths  Pt is bright, engaging, and motivated for therapy.  Client Treatment Preferences  Individual therapy.  Client Statement of Needs  Improve copings skills and Improve self esteem.  Symptoms  Depressed or irritable mood. Excessive and/or unrealistic worry that is difficult to control occurring more days than not for at least 6 months about a number of events or activities. Hypervigilance (e.g., feeling constantly on edge, experiencing concentration difficulties, having trouble falling or staying asleep, exhibiting a general state of irritability). Low self-esteem. Problems Addressed  Unipolar Depression, Anxiety Goals 1. Alleviate depressive symptoms and return to previous level of effective functioning. 2. Appropriately grieve the loss in order to normalize mood and to return to previously adaptive level of functioning. Objective Learn and implement behavioral strategies to overcome depression. Target Date: 2024-09-27 Frequency: Monthly  Progress: 60 Modality: individual  Related Interventions Assist the client in developing skills that increase the likelihood of deriving pleasure from behavioral activation (e.g., assertiveness skills, developing an exercise plan, less internal/more external focus, increased social involvement); reinforce success. Engage the client in behavioral activation, increasing his/her activity level and  contact with sources of reward, while identifying processes that inhibit activation. use behavioral techniques such as instruction, rehearsal, role-playing, role reversal, as needed, to facilitate  activity in the client's daily life; reinforce success. 3. Develop healthy interpersonal relationships that lead to the alleviation and help prevent the relapse of depression. 4. Develop healthy thinking patterns and beliefs about self, others, and the world that lead to the alleviation and help prevent the relapse of depression. 5. Enhance ability to effectively cope with the full variety of life's worries and anxieties. 6. Learn and implement coping skills that result in a reduction of anxiety and worry, and improved daily functioning. Objective Learn and implement problem-solving strategies for realistically addressing worries. Target Date: 2024-09-27 Frequency: Monthly  Progress: 60 Modality: individual  Related Interventions Assign the client a homework exercise in which he/she problem-solves a current problem (see Mastery of Your Anxiety and Worry: Workbook by Richarda and Jonne or Generalized Anxiety Disorder by Delores Filler, and Jonne); review, reinforce success, and provide corrective feedback toward improvement. Teach the client problem-solving strategies involving specifically defining a problem, generating options for addressing it, evaluating the pros and cons of each option, selecting and implementing an optional action, and reevaluating and refining the action. Objective Learn and implement calming skills to reduce overall anxiety and manage anxiety symptoms. Target Date: 2024-09-27 Frequency: Monthly  Progress: 60 Modality: individual  Related Interventions Assign the client to read about progressive muscle relaxation and other calming strategies in relevant books or treatment manuals (e.g., Progressive Relaxation Training by Thornell armin Collier; Mastery of Your Anxiety and Worry: Workbook by Richarda armin Jonne). Assign the client homework each session in which he/she practices relaxation exercises daily, gradually applying them progressively from non-anxiety-provoking to  anxiety-provoking situations; review and reinforce success while providing corrective feedback toward improvement. Teach the client calming/relaxation skills (e.g., applied relaxation, progressive muscle relaxation, cue controlled relaxation; mindful breathing; biofeedback) and how to discriminate better between relaxation and tension; teach the client how to apply these skills to his/her daily life. 7. Recognize, accept, and cope with feelings of depression. 8. Reduce overall frequency, intensity, and duration of the anxiety so that daily functioning is not impaired. 9. Resolve the core conflict that is the source of anxiety. 10. Stabilize anxiety level while increasing ability to function on a daily basis. Diagnosis F43.23 Conditions For Discharge Achievement of treatment goals and objectives   Veva Alma, LCSW

## 2024-07-24 ENCOUNTER — Telehealth: Payer: Self-pay

## 2024-07-24 NOTE — Telephone Encounter (Signed)
 Yes

## 2024-07-24 NOTE — Telephone Encounter (Signed)
 Communication  Reason for CRM: Katie from [facility] is calling to find out if its ok to change the manufacturer for Levothyroxin script due to being on back order. Callback number [phone]

## 2024-07-25 NOTE — Telephone Encounter (Signed)
 Spoke with pharmacy

## 2024-08-01 ENCOUNTER — Ambulatory Visit: Admitting: Psychology

## 2024-08-01 DIAGNOSIS — F4323 Adjustment disorder with mixed anxiety and depressed mood: Secondary | ICD-10-CM | POA: Diagnosis not present

## 2024-08-01 NOTE — Progress Notes (Signed)
 [facility] Counselor/Therapist Progress Note  Patient ID: Abigail Michael, MRN: [id number],    Date: 08/01/2024  Time Spent: 11:00am-11:55am   55 minutes   Treatment Type: Individual Therapy  Reported Symptoms: stress, anxiety  Mental Status Exam: Appearance:  Casual     Behavior: Appropriate  Motor: Normal  Speech/Language:  Normal Rate  Affect: Appropriate  Mood: normal  Thought process: normal  Thought content:   WNL  Sensory/Perceptual disturbances:   WNL  Orientation: oriented to person, place, time/date, and situation  Attention: Good  Concentration: Good  Memory: WNL  Fund of knowledge:  Good  Insight:   Good  Judgment:  Good  Impulse Control: Good   Risk Assessment: Danger to Self:  No Self-injurious Behavior: No Danger to Others: No Duty to Warn:no Physical Aggression / Violence:No  Access to Firearms a concern: No  Gang Involvement:No   Subjective:  Pt present for face-to-face individual therapy in person. Pt talked about her ex husband passing away September 29th.   Addressed how this impacted pt.   She was able to say goodbye to him.  Pt got a call last week and was told she will no longer get her ex-husband's retirement check since he passed away.  Pt relies on that money and is very upset.  Pt's monthly income is now reduced by $1,400.00 a month.  She can not afford her apartment now.  Pt has to give up her apartment but her lease is not up until March.   Pt ha the option to break her lease but it will be expensive.   Pt's daughter [name] said pt can live in her house with pt's daughter [name] but that house will be sold next year.  Pt's brother has been talking with her and providing support and is encouraging her that she doesn't need to give up her apartment.  Pt decided she will stay in the apartment until March when her lease is up.  After that pt may move to the beach to live in [name].  Pt doesn't want to go to the beach  bc she is worried [name] will keep checking on pt's drinking.  Pt is also afraid of having to start over socially at the beach.  Pt will have her own one bedroom apartment in the basement of [name]'s very nice beach house. Pt has been feeling anxious and worrying a lot.   However, she will still have independence and will be able to have more expendable income than she does now with paying rent and utilities for her apartment.   Helped pt look at the facts and numbers and she felt reassured that she will be ok.   Pt states she has not been drinking as much so she thinks her drinking will not be an issue for [name].   Pt is going to continue to work on her drinking.  Pt talked about her health.  She has been having a lot of hip and back pain.  Worked on self care strategies.   Provided supportive therapy.    Interventions: Cognitive Behavioral Therapy and Insight-Oriented  Diagnosis:  F43.23  Plan of Care: Recommend ongoing therapy.   Pt participated in setting treatment goals.   Plan to meet monthly.  Pt agrees with treatment plan.  Treatment Plan (target date: 09/27/2024) Client Abilities/Strengths  Pt is bright, engaging, and motivated for therapy.  Client Treatment Preferences  Individual therapy.  Client Statement of Needs  Improve copings skills and  Improve self esteem.  Symptoms  Depressed or irritable mood. Excessive and/or unrealistic worry that is difficult to control occurring more days than not for at least 6 months about a number of events or activities. Hypervigilance (e.g., feeling constantly on edge, experiencing concentration difficulties, having trouble falling or staying asleep, exhibiting a general state of irritability). Low self-esteem. Problems Addressed  Unipolar Depression, Anxiety Goals 1. Alleviate depressive symptoms and return to previous level of effective functioning. 2. Appropriately grieve the loss in order to normalize mood and to return to previously adaptive  level of functioning. Objective Learn and implement behavioral strategies to overcome depression. Target Date: 2024-09-27 Frequency: Monthly  Progress: 60 Modality: individual  Related Interventions Assist the client in developing skills that increase the likelihood of deriving pleasure from behavioral activation (e.g., assertiveness skills, developing an exercise plan, less internal/more external focus, increased social involvement); reinforce success. Engage the client in behavioral activation, increasing his/her activity level and contact with sources of reward, while identifying processes that inhibit activation. use behavioral techniques such as instruction, rehearsal, role-playing, role reversal, as needed, to facilitate activity in the client's daily life; reinforce success. 3. Develop healthy interpersonal relationships that lead to the alleviation and help prevent the relapse of depression. 4. Develop healthy thinking patterns and beliefs about self, others, and the world that lead to the alleviation and help prevent the relapse of depression. 5. Enhance ability to effectively cope with the full variety of life's worries and anxieties. 6. Learn and implement coping skills that result in a reduction of anxiety and worry, and improved daily functioning. Objective Learn and implement problem-solving strategies for realistically addressing worries. Target Date: 2024-09-27 Frequency: Monthly  Progress: 60 Modality: individual  Related Interventions Assign the client a homework exercise in which he/she problem-solves a current problem (see Mastery of Your Anxiety and Worry: Workbook by Richarda and Jonne or Generalized Anxiety Disorder by Delores Filler, and Jonne); review, reinforce success, and provide corrective feedback toward improvement. Teach the client problem-solving strategies involving specifically defining a problem, generating options for addressing it, evaluating the pros and  cons of each option, selecting and implementing an optional action, and reevaluating and refining the action. Objective Learn and implement calming skills to reduce overall anxiety and manage anxiety symptoms. Target Date: 2024-09-27 Frequency: Monthly  Progress: 60 Modality: individual  Related Interventions Assign the client to read about progressive muscle relaxation and other calming strategies in relevant books or treatment manuals (e.g., Progressive Relaxation Training by Thornell armin Collier; Mastery of Your Anxiety and Worry: Workbook by Richarda armin Jonne). Assign the client homework each session in which he/she practices relaxation exercises daily, gradually applying them progressively from non-anxiety-provoking to anxiety-provoking situations; review and reinforce success while providing corrective feedback toward improvement. Teach the client calming/relaxation skills (e.g., applied relaxation, progressive muscle relaxation, cue controlled relaxation; mindful breathing; biofeedback) and how to discriminate better between relaxation and tension; teach the client how to apply these skills to his/her daily life. 7. Recognize, accept, and cope with feelings of depression. 8. Reduce overall frequency, intensity, and duration of the anxiety so that daily functioning is not impaired. 9. Resolve the core conflict that is the source of anxiety. 10. Stabilize anxiety level while increasing ability to function on a daily basis. Diagnosis F43.23 Conditions For Discharge Achievement of treatment goals and objectives   Veva Alma, LCSW

## 2024-08-03 ENCOUNTER — Other Ambulatory Visit: Payer: Self-pay | Admitting: Family Medicine

## 2024-08-29 ENCOUNTER — Ambulatory Visit: Admitting: Psychology

## 2024-08-29 DIAGNOSIS — F4323 Adjustment disorder with mixed anxiety and depressed mood: Secondary | ICD-10-CM | POA: Diagnosis not present

## 2024-08-29 NOTE — Progress Notes (Signed)
 [facility] Counselor/Therapist Progress Note  Patient ID: Abigail Michael, MRN: [id number],    Date: 08/29/2024  Time Spent: 11:00am-11:45am   45 minutes   Treatment Type: Individual Therapy  Reported Symptoms: stress, anxiety  Mental Status Exam: Appearance:  Casual     Behavior: Appropriate  Motor: Normal  Speech/Language:  Normal Rate  Affect: Appropriate  Mood: normal  Thought process: normal  Thought content:   WNL  Sensory/Perceptual disturbances:   WNL  Orientation: oriented to person, place, time/date, and situation  Attention: Good  Concentration: Good  Memory: WNL  Fund of knowledge:  Good  Insight:   Good  Judgment:  Good  Impulse Control: Good   Risk Assessment: Danger to Self:  No Self-injurious Behavior: No Danger to Others: No Duty to Warn:no Physical Aggression / Violence:No  Access to Firearms a concern: No  Gang Involvement:No   Subjective:  Pt present for face-to-face individual therapy in person. Pt talked about still thinking about and worrying about her move when her apartment is up in March.   Pt has been looking at housing for the elderly but has not found one she can afford.  Pt has been going through her things and states it has been very hard to get rid of things.   Pt has donated a lot of things.   Pt is having trouble with thinking about moving to her daughter [name] bc she sees it as a loss of her independence.  Pt will have a lot of privacy there but won't be able to have all of her things bc of space limitations.   Pt is very anxious about the move.  Helped pt manage worry thoughts and worked on present moment mindfulness. Pt states she feels very unsettled bc of the upcoming changes.  Helped pt process her feelings.   Pt is struggling with the fact that things have not turned out the way she would like in her life.  She was hoping she would have her own home with a little yard.  Pt is grieving the loss of her  hopes and dreams.   Pt talked about having to put down [name]'s cat.  It was very sad and hard to do.  Helped pt process her grief.   Worked on self care strategies.   Provided supportive therapy.    Interventions: Cognitive Behavioral Therapy and Insight-Oriented  Diagnosis:  F43.23  Plan of Care: Recommend ongoing therapy.   Pt participated in setting treatment goals.   Plan to meet monthly.  Pt agrees with treatment plan.  Treatment Plan (target date: 09/27/2024) Client Abilities/Strengths  Pt is bright, engaging, and motivated for therapy.  Client Treatment Preferences  Individual therapy.  Client Statement of Needs  Improve copings skills and Improve self esteem.  Symptoms  Depressed or irritable mood. Excessive and/or unrealistic worry that is difficult to control occurring more days than not for at least 6 months about a number of events or activities. Hypervigilance (e.g., feeling constantly on edge, experiencing concentration difficulties, having trouble falling or staying asleep, exhibiting a general state of irritability). Low self-esteem. Problems Addressed  Unipolar Depression, Anxiety Goals 1. Alleviate depressive symptoms and return to previous level of effective functioning. 2. Appropriately grieve the loss in order to normalize mood and to return to previously adaptive level of functioning. Objective Learn and implement behavioral strategies to overcome depression. Target Date: 2024-09-27 Frequency: Monthly  Progress: 60 Modality: individual  Related Interventions Assist the client in developing skills  that increase the likelihood of deriving pleasure from behavioral activation (e.g., assertiveness skills, developing an exercise plan, less internal/more external focus, increased social involvement); reinforce success. Engage the client in behavioral activation, increasing his/her activity level and contact with sources of reward, while identifying processes that  inhibit activation. use behavioral techniques such as instruction, rehearsal, role-playing, role reversal, as needed, to facilitate activity in the client's daily life; reinforce success. 3. Develop healthy interpersonal relationships that lead to the alleviation and help prevent the relapse of depression. 4. Develop healthy thinking patterns and beliefs about self, others, and the world that lead to the alleviation and help prevent the relapse of depression. 5. Enhance ability to effectively cope with the full variety of life's worries and anxieties. 6. Learn and implement coping skills that result in a reduction of anxiety and worry, and improved daily functioning. Objective Learn and implement problem-solving strategies for realistically addressing worries. Target Date: 2024-09-27 Frequency: Monthly  Progress: 60 Modality: individual  Related Interventions Assign the client a homework exercise in which he/she problem-solves a current problem (see Mastery of Your Anxiety and Worry: Workbook by Richarda and Jonne or Generalized Anxiety Disorder by Delores Filler, and Jonne); review, reinforce success, and provide corrective feedback toward improvement. Teach the client problem-solving strategies involving specifically defining a problem, generating options for addressing it, evaluating the pros and cons of each option, selecting and implementing an optional action, and reevaluating and refining the action. Objective Learn and implement calming skills to reduce overall anxiety and manage anxiety symptoms. Target Date: 2024-09-27 Frequency: Monthly  Progress: 60 Modality: individual  Related Interventions Assign the client to read about progressive muscle relaxation and other calming strategies in relevant books or treatment manuals (e.g., Progressive Relaxation Training by Thornell armin Collier; Mastery of Your Anxiety and Worry: Workbook by Richarda armin Jonne). Assign the client homework each  session in which he/she practices relaxation exercises daily, gradually applying them progressively from non-anxiety-provoking to anxiety-provoking situations; review and reinforce success while providing corrective feedback toward improvement. Teach the client calming/relaxation skills (e.g., applied relaxation, progressive muscle relaxation, cue controlled relaxation; mindful breathing; biofeedback) and how to discriminate better between relaxation and tension; teach the client how to apply these skills to his/her daily life. 7. Recognize, accept, and cope with feelings of depression. 8. Reduce overall frequency, intensity, and duration of the anxiety so that daily functioning is not impaired. 9. Resolve the core conflict that is the source of anxiety. 10. Stabilize anxiety level while increasing ability to function on a daily basis. Diagnosis F43.23 Conditions For Discharge Achievement of treatment goals and objectives   Veva Alma, LCSW

## 2024-08-30 DIAGNOSIS — H6691 Otitis media, unspecified, right ear: Secondary | ICD-10-CM | POA: Diagnosis not present

## 2024-08-30 DIAGNOSIS — R059 Cough, unspecified: Secondary | ICD-10-CM | POA: Diagnosis not present

## 2024-09-07 ENCOUNTER — Other Ambulatory Visit: Payer: Self-pay | Admitting: Family Medicine

## 2024-09-07 MED ORDER — FOLIC ACID 1 MG PO TABS: 1.0000 mg | ORAL_TABLET | Freq: Every day | ORAL | 0 refills | Status: DC

## 2024-09-07 NOTE — Telephone Encounter (Signed)
 Copied from CRM [id number]. Topic: Clinical - Medication Refill >> Sep 07, 2024 11:26 AM Ashley R wrote: Medication:  folic acid  (FOLVITE ) 1 MG tablet    Has the patient contacted their pharmacy? Yes,   This is the patient's preferred pharmacy:   [facility] - [place], [place] - [address] [facility] AT Portal to Registered [facility] [address] [place] [address] Phone: [phone] Fax: [phone]   Is this the correct pharmacy for this prescription? Yes If no, delete pharmacy and type the correct one.   Has the prescription been filled recently? Yes  Is the patient out of the medication? Yes  Has the patient been seen for an appointment in the last year OR does the patient have an upcoming appointment? Yes  Can we respond through MyChart? No  Agent: Please be advised that Rx refills may take up to 3 business days. We ask that you follow-up with your pharmacy.

## 2024-09-19 ENCOUNTER — Ambulatory Visit: Payer: Medicare Other | Admitting: *Deleted

## 2024-09-19 VITALS — Ht 61.0 in | Wt 168.0 lb

## 2024-09-19 DIAGNOSIS — Z780 Asymptomatic menopausal state: Secondary | ICD-10-CM

## 2024-09-19 DIAGNOSIS — Z1211 Encounter for screening for malignant neoplasm of colon: Secondary | ICD-10-CM

## 2024-09-19 DIAGNOSIS — Z0000 Encounter for general adult medical examination without abnormal findings: Secondary | ICD-10-CM | POA: Diagnosis not present

## 2024-09-19 NOTE — Patient Instructions (Signed)
 Abigail Michael,  Thank you for taking the time for your Medicare Wellness Visit. I appreciate your continued commitment to your health goals. Please review the care plan we discussed, and feel free to reach out if I can assist you further.  Please note that Annual Wellness Visits do not include a physical exam. Some assessments may be limited, especially if the visit was conducted virtually. If needed, we may recommend an in-person follow-up with your provider.  Ongoing Care Seeing your primary care provider every 3 to 6 months helps us  monitor your health and provide consistent, personalized care.   Referrals If a referral was made during today's visit and you haven't received any updates within two weeks, please contact the referred provider directly to check on the status.  Recommended Screenings:  Health Maintenance  Topic Date Due   Colon Cancer Screening  09/04/2022   Flu Shot  05/18/2024   Medicare Annual Wellness Visit  09/19/2025   Breast Cancer Screening  10/12/2025   DTaP/Tdap/Td vaccine (3 - Td or Tdap) 05/03/2033   Pneumococcal Vaccine for age over 47  Completed   Osteoporosis screening with Bone Density Scan  Completed   Hepatitis C Screening  Completed   Zoster (Shingles) Vaccine  Completed   Meningitis B Vaccine  Aged Out   COVID-19 Vaccine  Discontinued       09/19/2024   10:15 AM  Advanced Directives  Does Patient Have a Medical Advance Directive? Yes  Type of Advance Directive Healthcare Power of Attorney  Copy of Healthcare Power of Attorney in Chart? No - copy requested    Vision: Annual vision screenings are recommended for early detection of glaucoma, cataracts, and diabetic retinopathy. These exams can also reveal signs of chronic conditions such as diabetes and high blood pressure.  Dental: Annual dental screenings help detect early signs of oral cancer, gum disease, and other conditions linked to overall health, including heart disease and  diabetes.  Please see the attached documents for additional preventive care   Abigail Michael , Thank you for taking time to come for your Medicare Wellness Visit. I appreciate your ongoing commitment to your health goals. Please review the following plan we discussed and let me know if I can assist you in the future.   Screening recommendations/referrals: Colonoscopy:  Mammogram:  Bone Density:  Recommended yearly ophthalmology/optometry visit for glaucoma screening and checkup Recommended yearly dental visit for hygiene and checkup  Vaccinations: Influenza vaccine:  Pneumococcal vaccine:  Tdap vaccine:  Shingles vaccine:      Preventive Care 65 Years and Older, Female Preventive care refers to lifestyle choices and visits with your health care provider that can promote health and wellness. What does preventive care include? A yearly physical exam. This is also called an annual well check. Dental exams once or twice a year. Routine eye exams. Ask your health care provider how often you should have your eyes checked. Personal lifestyle choices, including: Daily care of your teeth and gums. Regular physical activity. Eating a healthy diet. Avoiding tobacco and drug use. Limiting alcohol use. Practicing safe sex. Taking low-dose aspirin every day. Taking vitamin and mineral supplements as recommended by your health care provider. What happens during an annual well check? The services and screenings done by your health care provider during your annual well check will depend on your age, overall health, lifestyle risk factors, and family history of disease. Counseling  Your health care provider may ask you questions about your: Alcohol use. Tobacco use.  Drug use. Emotional well-being. Home and relationship well-being. Sexual activity. Eating habits. History of falls. Memory and ability to understand (cognition). Work and work astronomer. Reproductive health. Screening  You  may have the following tests or measurements: Height, weight, and BMI. Blood pressure. Lipid and cholesterol levels. These may be checked every 5 years, or more frequently if you are over 16 years old. Skin check. Lung cancer screening. You may have this screening every year starting at age 60 if you have a 30-pack-year history of smoking and currently smoke or have quit within the past 15 years. Fecal occult blood test (FOBT) of the stool. You may have this test every year starting at age 30. Flexible sigmoidoscopy or colonoscopy. You may have a sigmoidoscopy every 5 years or a colonoscopy every 10 years starting at age 22. Hepatitis C blood test. Hepatitis B blood test. Sexually transmitted disease (STD) testing. Diabetes screening. This is done by checking your blood sugar (glucose) after you have not eaten for a while (fasting). You may have this done every 1-3 years. Bone density scan. This is done to screen for osteoporosis. You may have this done starting at age 68. Mammogram. This may be done every 1-2 years. Talk to your health care provider about how often you should have regular mammograms. Talk with your health care provider about your test results, treatment options, and if necessary, the need for more tests. Vaccines  Your health care provider may recommend certain vaccines, such as: Influenza vaccine. This is recommended every year. Tetanus, diphtheria, and acellular pertussis (Tdap, Td) vaccine. You may need a Td booster every 10 years. Zoster vaccine. You may need this after age 55. Pneumococcal 13-valent conjugate (PCV13) vaccine. One dose is recommended after age 63. Pneumococcal polysaccharide (PPSV23) vaccine. One dose is recommended after age 40. Talk to your health care provider about which screenings and vaccines you need and how often you need them. This information is not intended to replace advice given to you by your health care provider. Make sure you discuss any  questions you have with your health care provider. Document Released: 10/31/2015 Document Revised: 06/23/2016 Document Reviewed: 08/05/2015 Elsevier Interactive Patient Education  2017 [employer].  Fall Prevention in the Home Falls can cause injuries. They can happen to people of all ages. There are many things you can do to make your home safe and to help prevent falls. What can I do on the outside of my home? Regularly fix the edges of walkways and driveways and fix any cracks. Remove anything that might make you trip as you walk through a door, such as a raised step or threshold. Trim any bushes or trees on the path to your home. Use bright outdoor lighting. Clear any walking paths of anything that might make someone trip, such as rocks or tools. Regularly check to see if handrails are loose or broken. Make sure that both sides of any steps have handrails. Any raised decks and porches should have guardrails on the edges. Have any leaves, snow, or ice cleared regularly. Use sand or salt on walking paths during winter. Clean up any spills in your garage right away. This includes oil or grease spills. What can I do in the bathroom? Use night lights. Install grab bars by the toilet and in the tub and shower. Do not use towel bars as grab bars. Use non-skid mats or decals in the tub or shower. If you need to sit down in the shower, use a plastic,  non-slip stool. Keep the floor dry. Clean up any water that spills on the floor as soon as it happens. Remove soap buildup in the tub or shower regularly. Attach bath mats securely with double-sided non-slip rug tape. Do not have throw rugs and other things on the floor that can make you trip. What can I do in the bedroom? Use night lights. Make sure that you have a light by your bed that is easy to reach. Do not use any sheets or blankets that are too big for your bed. They should not hang down onto the floor. Have a firm chair that has side  arms. You can use this for support while you get dressed. Do not have throw rugs and other things on the floor that can make you trip. What can I do in the kitchen? Clean up any spills right away. Avoid walking on wet floors. Keep items that you use a lot in easy-to-reach places. If you need to reach something above you, use a strong step stool that has a grab bar. Keep electrical cords out of the way. Do not use floor polish or wax that makes floors slippery. If you must use wax, use non-skid floor wax. Do not have throw rugs and other things on the floor that can make you trip. What can I do with my stairs? Do not leave any items on the stairs. Make sure that there are handrails on both sides of the stairs and use them. Fix handrails that are broken or loose. Make sure that handrails are as long as the stairways. Check any carpeting to make sure that it is firmly attached to the stairs. Fix any carpet that is loose or worn. Avoid having throw rugs at the top or bottom of the stairs. If you do have throw rugs, attach them to the floor with carpet tape. Make sure that you have a light switch at the top of the stairs and the bottom of the stairs. If you do not have them, ask someone to add them for you. What else can I do to help prevent falls? Wear shoes that: Do not have high heels. Have rubber bottoms. Are comfortable and fit you well. Are closed at the toe. Do not wear sandals. If you use a stepladder: Make sure that it is fully opened. Do not climb a closed stepladder. Make sure that both sides of the stepladder are locked into place. Ask someone to hold it for you, if possible. Clearly mark and make sure that you can see: Any grab bars or handrails. First and last steps. Where the edge of each step is. Use tools that help you move around (mobility aids) if they are needed. These include: Canes. Walkers. Scooters. Crutches. Turn on the lights when you go into a dark area.  Replace any light bulbs as soon as they burn out. Set up your furniture so you have a clear path. Avoid moving your furniture around. If any of your floors are uneven, fix them. If there are any pets around you, be aware of where they are. Review your medicines with your doctor. Some medicines can make you feel dizzy. This can increase your chance of falling. Ask your doctor what other things that you can do to help prevent falls. This information is not intended to replace advice given to you by your health care provider. Make sure you discuss any questions you have with your health care provider. Document Released: 07/31/2009 Document Revised: 03/11/2016 Document  Reviewed: 11/08/2014 Elsevier Interactive Patient Education  2017 [employer]. recommendations.

## 2024-09-19 NOTE — Progress Notes (Signed)
 Chief Complaint  Patient presents with   Medicare Wellness     Subjective:   Abigail Michael is a 73 y.o. female who presents for a Medicare Annual Wellness Visit.  I connected with  Abigail Michael on 09/19/24 by a audio enabled telemedicine application and verified that I am speaking with the correct person using two identifiers.  Patient Location: Home  Provider Location: Home Office  Persons Participating in Visit: Patient.  I discussed the limitations of evaluation and management by telemedicine. The patient expressed understanding and agreed to proceed.   Vital Signs: Because this visit was a virtual/telehealth visit, some criteria may be missing or patient reported. Any vitals not documented were not able to be obtained and vitals that have been documented are patient reported.      Visit info / Clinical Intake: Medicare Wellness Visit Type:: Subsequent Annual Wellness Visit Persons participating in visit and providing information:: patient Medicare Wellness Visit Mode:: Telephone If telephone:: video declined Since this visit was completed virtually, some vitals may be partially provided or unavailable. Missing vitals are due to the limitations of the virtual format.: Unable to obtain vitals - no equipment If Telephone or Video please confirm:: I connected with patient using audio/video enable telemedicine. I verified patient identity with two identifiers, discussed telehealth limitations, and patient agreed to proceed. Patient Location:: home Provider Location:: home Interpreter Needed?: No Pre-visit prep was completed: no AWV questionnaire completed by patient prior to visit?: no Living arrangements:: (!) lives alone Patient's Overall Health Status Rating: good Typical amount of pain: some Does pain affect daily life?: no Are you currently prescribed opioids?: no  Dietary Habits and Nutritional Risks How many meals a day?: 2 Eats fruit and vegetables daily?:  yes Most meals are obtained by: eating out; preparing own meals In the last 2 weeks, have you had any of the following?: none Diabetic:: no  Functional Status Activities of Daily Living (to include ambulation/medication): Independent Ambulation: Independent Medication Administration: Independent Home Management (perform basic housework or laundry): Independent Manage your own finances?: yes Primary transportation is: driving Concerns about vision?: no *vision screening is required for WTM* Concerns about hearing?: (!) yes Uses hearing aids?: (!) yes Hear whispered voice?: (!) no *in-person visit only*  Fall Screening Falls in the past year?: 0 Number of falls in past year: 0 Was there an injury with Fall?: 0 Fall Risk Category Calculator: 0 Patient Fall Risk Level: Low Fall Risk  Fall Risk Patient at Risk for Falls Due to: No Fall Risks Fall risk Follow up: Falls evaluation completed; Education provided; Falls prevention discussed  Home and Transportation Safety: All rugs have non-skid backing?: yes All stairs or steps have railings?: N/A, no stairs Grab bars in the bathtub or shower?: (!) no Have non-skid surface in bathtub or shower?: yes Good home lighting?: yes Regular seat belt use?: yes Hospital stays in the last year:: no  Cognitive Assessment Difficulty concentrating, remembering, or making decisions? : yes Will 6CIT or Mini Cog be Completed: yes What year is it?: 0 points What month is it?: 0 points Give patient an address phrase to remember (5 components): its very sunny outside today in December About what time is it?: 0 points Count backwards from 20 to 1: 0 points Say the months of the year in reverse: 0 points Repeat the address phrase from earlier: 0 points 6 CIT Score: 0 points  Advance Directives (For Healthcare) Does Patient Have a Medical Advance Directive?: Yes Type of Advance Directive:  Healthcare Power of [employer] of Healthcare Power of  Attorney in Chart?: No - copy requested  Reviewed/Updated  Reviewed/Updated: Reviewed All (Medical, Surgical, Family, Medications, Allergies, Care Teams, Patient Goals); Surgical History; Family History; Medications; Allergies; Care Teams; Patient Goals; Medical History    Allergies (verified) Bee venom and Amoxicillin   Current Medications (verified) Outpatient Encounter Medications as of 09/19/2024  Medication Sig   albuterol  (VENTOLIN  HFA) 108 (90 Base) MCG/ACT inhaler INHALE TWO PUFFS BY MOUTH INTO THE LUNGS EVERY SIX HOURS AS NEEDED FOR WHEEZING/SHORTNESS OF BREATH   amLODipine  (NORVASC ) 10 MG tablet Take 1 tablet (10 mg total) by mouth daily.   atorvastatin  (LIPITOR) 40 MG tablet Take 1 tablet (40 mg total) by mouth daily.   buPROPion  (WELLBUTRIN  SR) 200 MG 12 hr tablet Take 1 tablet (200 mg total) by mouth 2 (two) times daily.   Cholecalciferol (VITAMIN D ) 50 MCG (2000 UT) tablet    EPINEPHrine  0.3 mg/0.3 mL IJ SOAJ injection Inject 0.3 mg into the muscle as needed for anaphylaxis.   esomeprazole  (NEXIUM ) 40 MG capsule TAKE ONE CAPSULE BY MOUTH ONE TIME DAILY AT NOON   famotidine  (PEPCID ) 20 MG tablet Take 1 tablet (20 mg total) by mouth daily   FLUoxetine  (PROZAC ) 40 MG capsule Take 1 capsule (40 mg total) by mouth daily   folic acid  (FOLVITE ) 1 MG tablet Take 1 tablet (1 mg total) by mouth daily.   levothyroxine  (SYNTHROID ) 50 MCG tablet Take 1 tablet (50 mcg total) by mouth daily before breakfast.   lisinopril  (ZESTRIL ) 5 MG tablet Take one-half tablet (2.5 mg total) by mouth daily   mirabegron  ER (MYRBETRIQ ) 50 MG TB24 tablet TAKE 1 TABLET DAILY   Na Sulfate-K Sulfate-Mg Sulfate concentrate (SUPREP) 17.5-3.13-1.6 GM/177ML SOLN Take 1 kit by mouth once.   OVER THE COUNTER MEDICATION Neura alpha   No facility-administered encounter medications on file as of 09/19/2024.    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 many MRI w/ positive white matter abnormality. MRI reported stable since 2015. mild progressive  Cognitive loss   Obstructive sleep apnea    on prior neuro notes 10/2016; no results.    Osteoporosis    had been on reclast    Post concussion syndrome 10/2013   ongoing issues with concentration, memory loss, lability; EEG completed and normal.    TBI (traumatic brain injury) (HCC) 2014   fall at [facility], hit head off of floor. No bleed, concussion. Seen neurology since for focus and word finding.    Thyroid  disease    TIA (transient ischemic attack) 11/30/2016   MRI normal. No lateralization. Speech affected and weakness during a hypotensive episdoe.    Tuberculosis    in her 20's meds for a year   Urinary incontinence    Past Surgical History:  Procedure Laterality Date   BACK SURGERY  02/2015   herniated disc L4/5/6   BREAST BIOPSY  1988   biopsy   catract Bilateral 2023  [place]COPY     [place] Around 2015   ESOPHAGOGASTRODUODENOSCOPY  2016   [place]    LAPAROSCOPIC HYSTERECTOMY     partial    SHOULDER ARTHROSCOPY W/ LABRAL REPAIR Left 02/13/2016   left shoulder   Study: cognitive eval  06/23/2016   mild cog-linguistic impairment, difficulty with verbal fluency   Study: EEG   04/12/2015   Studies: EEG completed and normal.    Study: MRI  04/28/2016   Impression: numerous non-enhancing flair hyperintense lesions w/in the brain .Suspicious for demyelinating disease such as lyme or MS. Her Lyme test were negative.    Family History  Problem Relation Age of Onset   Lung cancer Mother    Alcohol abuse Father    Throat cancer Father     Lung cancer Father    Depression Father    Heart attack Father    Breast cancer Sister    Mental illness Sister    Alcohol abuse Brother    Diabetes Brother    Breast cancer Maternal Aunt    Diabetes Maternal Grandmother    Esophageal cancer Paternal Grandfather    Stroke Daughter    Endometriosis Daughter    GER disease Daughter    Heart disease Son    Colon cancer Neg Hx    Rectal cancer Neg Hx    Stomach cancer Neg Hx    Colon polyps Neg Hx    Social History   Occupational History   Occupation: retired  Tobacco Use   Smoking status: Former    Current packs/day: 0.00    Average packs/day: 1 pack/day for 19.0 years (19.0 ttl pk-yrs)    Types: Cigarettes    Start date: 50    Quit date: 2012    Years since quitting: 13.9   Smokeless tobacco: Never   Tobacco comments:    quit 6 years ago  Vaping Use   Vaping status: Never Used  Substance and Sexual Activity   Alcohol use: Yes    Alcohol/week: 1.0 standard drink of alcohol    Types: 1 Glasses of wine per week    Comment: twice a month   Drug use: Never   Sexual activity: Not Currently    Partners: Male   Tobacco Counseling Counseling given: Not Answered Tobacco comments: quit 6 years ago  SDOH Screenings   Food Insecurity: No Food Insecurity (09/19/2024)  Housing: Unknown (09/19/2024)  Transportation Needs: No Transportation Needs (09/19/2024)  Utilities: Not At Risk (09/19/2024)  Alcohol Screen: Low Risk  (12/12/2021)  Depression (PHQ2-9): Low Risk  (09/19/2024)  Financial Resource Strain: Low Risk  (09/07/2023)  Physical Activity: Insufficiently Active (09/19/2024)  Social Connections: Unknown (09/19/2024)  Stress: No Stress Concern Present (09/19/2024)  Tobacco Use: Medium Risk (09/19/2024)  Health Literacy: Adequate Health Literacy (09/19/2024)   See flowsheets for full screening details  Depression Screen PHQ 2 & 9 Depression Scale- Over the past 2 weeks, how often have you been bothered by any of the  following problems? Little interest or pleasure in doing things: 0 Feeling down, depressed, or hopeless (PHQ Adolescent also includes...irritable): 0 PHQ-2 Total Score: 0 Trouble falling or staying asleep, or sleeping too much: 0 Feeling tired or having little energy: 0 Poor appetite or overeating (PHQ Adolescent also includes...weight loss): 0 Feeling bad about yourself - or that you are a failure or have let yourself or your family down: 0 Trouble concentrating on things, such as reading the newspaper or watching television (PHQ Adolescent also includes...like school work): 0 Moving  or speaking so slowly that other people could have noticed. Or the opposite - being so fidgety or restless that you have been moving around a lot more than usual: 0 Thoughts that you would be better off dead, or of hurting yourself in some way: 0 PHQ-9 Total Score: 0 If you checked off any problems, how difficult have these problems made it for you to do your work, take care of things at home, or get along with other people?: Not difficult at all     Goals Addressed             This Visit's Progress    Patient Stated   On track    Be more active by walking more & eat healthier     Patient Stated   On track    Maintain current healthy lifestyle     Patient Stated   Not on track    Get a hobby     Weight (lb) < 200 lb (90.7 kg)   168 lb (76.2 kg)            Objective:    Today's Vitals   09/19/24 1015  Weight: 168 lb (76.2 kg)  Height: 5' 1 (1.549 m)   Body mass index is 31.74 kg/m.  Hearing/Vision screen Hearing Screening - Comments:: Bialteral hearing Vision Screening - Comments:: Newt Up to date Immunizations and Health Maintenance Health Maintenance  Topic Date Due   Colonoscopy  09/04/2022   Influenza Vaccine  05/18/2024   Medicare Annual Wellness (AWV)  09/19/2025   Mammogram  10/12/2025   DTaP/Tdap/Td (3 - Td or Tdap) 05/03/2033   Pneumococcal Vaccine: 50+ Years   Completed   Bone Density Scan  Completed   Hepatitis C Screening  Completed   Zoster Vaccines- Shingrix  Completed   Meningococcal B Vaccine  Aged Out   COVID-19 Vaccine  Discontinued        Assessment/Plan:  This is a routine wellness examination for [place].  Patient Care Team: Catherine Charlies LABOR, DO as PCP - General (Family Medicine) Asencion Rush, MD as Referring Physician (Neurology) Megan (Dentistry) Jeppie (Orthodontics) Alex Glendia SAUNDERS (Oral Surgery) Basuroski, Ivan Hamming, MD as Referring Physician (Psychiatry) Lavonia Lye, MD as Consulting Physician (Ophthalmology)  I have personally reviewed and noted the following in the patient's chart:   Medical and social history Use of alcohol, tobacco or illicit drugs  Current medications and supplements including opioid prescriptions. Functional ability and status Nutritional status Physical activity Advanced directives List of other physicians Hospitalizations, surgeries, and ER visits in previous 12 months Vitals Screenings to include cognitive, depression, and falls Referrals and appointments  Orders Placed This Encounter  Procedures   DG Bone Density    Reason for Exam (SYMPTOM  OR DIAGNOSIS REQUIRED):   screening for osteoporosis    Preferred imaging location?:   [facility]   Ambulatory referral to Gastroenterology    Referral Priority:   Routine    Referral Type:   Consultation    Referral Reason:   Specialty Services Required    Number of Visits Requested:   1   In addition, I have reviewed and discussed with patient certain preventive protocols, quality metrics, and best practice recommendations. A written personalized care plan for preventive services as well as general preventive health recommendations were provided to patient.   Khali Albanese, LPN   [id number]   Return in 1 year (on 09/19/2025).  After Visit Summary: (MyChart) Due to this being a telephonic  visit, the after visit summary  with patients personalized plan was offered to patient via MyChart   Nurse Notes:

## 2024-09-26 ENCOUNTER — Ambulatory Visit: Admitting: Psychology

## 2024-10-02 ENCOUNTER — Ambulatory Visit: Admitting: Family Medicine

## 2024-10-02 ENCOUNTER — Encounter: Payer: Self-pay | Admitting: Family Medicine

## 2024-10-02 VITALS — BP 126/78 | HR 65 | Temp 98.3°F | Wt 167.8 lb

## 2024-10-02 DIAGNOSIS — F418 Other specified anxiety disorders: Secondary | ICD-10-CM | POA: Diagnosis not present

## 2024-10-02 DIAGNOSIS — F331 Major depressive disorder, recurrent, moderate: Secondary | ICD-10-CM | POA: Diagnosis not present

## 2024-10-02 MED ORDER — LISINOPRIL 5 MG PO TABS: 5.0000 mg | ORAL_TABLET | Freq: Every day | ORAL | 1 refills | Status: AC

## 2024-10-02 MED ORDER — BUSPIRONE HCL 5 MG PO TABS: 5.0000 mg | ORAL_TABLET | Freq: Two times a day (BID) | ORAL | 1 refills | Status: AC

## 2024-10-02 MED ORDER — MIRABEGRON ER 50 MG PO TB24: 50.0000 mg | ORAL_TABLET | Freq: Every day | ORAL | 0 refills | Status: DC

## 2024-10-02 MED ORDER — BUPROPION HCL ER (SR) 200 MG PO TB12: 200.0000 mg | ORAL_TABLET | Freq: Two times a day (BID) | ORAL | 1 refills | Status: DC

## 2024-10-02 MED ORDER — FLUOXETINE HCL 40 MG PO CAPS: 40.0000 mg | ORAL_CAPSULE | Freq: Every day | ORAL | 0 refills | Status: DC

## 2024-10-02 MED ORDER — FLUOXETINE HCL 20 MG PO CAPS: 20.0000 mg | ORAL_CAPSULE | Freq: Every day | ORAL | 1 refills | Status: AC

## 2024-10-02 NOTE — Progress Notes (Unsigned)
 Abigail Michael , Oct 01, 1951, 73 y.o., female MRN: [id number] Patient Care Team    Relationship Specialty Notifications Start End  Catherine Charlies LABOR, DO PCP - General Family Medicine  01/16/18   Asencion Rush, MD Referring Physician Neurology  08/28/18   Megan  Dentistry  07/17/19   [name]  Orthodontics  07/17/19   Alex Hamilton R  Oral Surgery  07/17/19   Charmel Ivan Hamming, MD Referring Physician Psychiatry  07/17/19    Comment: Neurology  Lavonia Lye, MD Consulting Physician Ophthalmology  12/16/21    Comment: cataract    Chief Complaint  Patient presents with   Depression    Worsening over the last month due to stress.      Subjective: Abigail Michael is a 73 y.o. Pt presents for an OV with complaints of worsening depression of 4 weeks duration.  Associated symptoms include lack of energy and motivation.  Patient is prescribed Wellbutrin  and Prozac  for her depression.  She states her depression worsened recently after her ex-husband passed away, and there is no pension for part, therefore she found that her financial affairs are not allowing her to maintain her apartment locally.  She is going to opt to move in with her daughter at [facility].  She reports this is upsetting to her because all of her friends or in the area here, which she also notes she is very lucky to have a family that is so supportive. She reports this is an increase stress level for her.  She does admit to drinking more alcohol to help cope with the changes.  Her daughters have brought it to her attention and she has cut back.  It is upsetting to her to think she has to start all over again and make new friends.     10/03/2024    3:57 PM 09/19/2024   10:19 AM 05/02/2024   10:44 AM 01/18/2024    9:00 AM 09/07/2023    2:24 PM  Depression screen PHQ 2/9  Decreased Interest 2 0 0 0 0  Down, Depressed, Hopeless 2 0 0 0 0  PHQ - 2 Score 4 0 0 0 0  Altered sleeping 1 0 1 0 0  Tired, decreased energy  1 0 0 0 1  Change in appetite 1 0 1 0 0  Feeling bad or failure about yourself  0 0 0 0 0  Trouble concentrating 0 0 0 0 0  Moving slowly or fidgety/restless 0 0 0 0 0  Suicidal thoughts 0  0 0 0  PHQ-9 Score 7 0 2  0  1   Difficult doing work/chores Somewhat difficult Not difficult at all Not difficult at all Not difficult at all Not difficult at all     Data saved with a previous flowsheet row definition      09/07/2023    2:14 PM 01/18/2024    9:00 AM 01/18/2024    9:01 AM 05/02/2024   10:44 AM 09/19/2024   10:15 AM  Fall Risk  Falls in the past year? 1 0 1 1 0  Was there an injury with Fall? 0   1  1  0  Fall Risk Category Calculator 1  2 2  0  Patient at Risk for Falls Due to     No Fall Risks  Fall risk Follow up Falls evaluation completed;Education provided;Falls prevention discussed Falls evaluation completed Falls evaluation completed;Education provided;Falls prevention discussed Falls evaluation completed Falls evaluation completed;Education provided;Falls prevention  discussed     Data saved with a previous flowsheet row definition    Allergies[1] Social History   Social History Narrative   Single. Lived in [place] area, moved to [place] to be close to her daughters 2018. Lives with her daughter.   Some college, worked as Sports Coach. Retired.    Social drinker. Former smoker.   Exercises routinely.    Wears dentures.    Drinks caffeine, takes a daily vitamin.   Smoke alarm in the home. Wears her seatbelt.    Feels safe in her relationships.   3-4 cups per day.   Four children (2 biological, 2 she raised as her own).   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 many MRI w/ positive white matter abnormality. MRI reported stable since 2015. mild progressive  Cognitive loss   Obstructive sleep apnea    on prior neuro notes 10/2016; no results.    Osteoporosis    had been on reclast    Post concussion syndrome 10/2013   ongoing issues with concentration, memory loss, lability; EEG completed and normal.    TBI (traumatic brain injury) (HCC) 2014   fall at [facility], hit head off of floor. No bleed, concussion. Seen neurology since for focus and word finding.    Thyroid  disease    TIA (transient ischemic attack) 11/30/2016   MRI normal. No lateralization. Speech affected and weakness during a hypotensive episdoe.    Tuberculosis    in her 20's meds for a year   Urinary incontinence    Past Surgical History:  Procedure Laterality Date   BACK SURGERY  02/2015   herniated disc L4/5/6   BREAST BIOPSY  1988   biopsy   catract Bilateral 2023   [place]OS[place] Around 2015   ESOPHAGO[place]ODU[place]ENOSCOPY  [place]   [place]    LAPAROSCOPIC HYSTERECTOMY     partial    SHOULDER ARTHROSCOPY W/ LABRAL REPAIR Left 02/13/2016   left shoulder   Study: cognitive eval  06/23/2016   mild cog-linguistic impairment, difficulty with verbal fluency   Study: EEG   04/12/2015   Studies: EEG completed and normal.    Study: MRI  04/28/2016   Impression: numerous non-enhancing flair hyperintense lesions w/in the brain .Suspicious for demyelinating disease such as lyme or MS. Her Lyme test were negative.    Family History  Problem Relation Age of Onset   Lung cancer Mother    Alcohol abuse Father    Throat cancer Father    Lung cancer Father    Depression Father    Heart attack Father    Breast cancer Sister    Mental illness Sister    Alcohol abuse Brother    Diabetes Brother    Breast cancer Maternal Aunt    Diabetes Maternal Grandmother    Esophageal cancer Paternal  Grandfather    Stroke Daughter    Endometriosis Daughter    GER disease Daughter    Heart disease Son  Colon cancer Neg Hx    Rectal cancer Neg Hx    Stomach cancer Neg Hx    Colon polyps Neg Hx    Allergies as of 10/02/2024       Reactions   Bee Venom Anaphylaxis   Other reaction(s): Anaphylaxis   Amoxicillin Dermatitis   Yeast infection Other reaction(s): Dermatitis/Yeast Infection        Medication List        Accurate as of October 02, 2024 11:59 PM. If you have any questions, ask your nurse or doctor.          albuterol  108 (90 Base) MCG/ACT inhaler Commonly known as: VENTOLIN  HFA INHALE TWO PUFFS BY MOUTH INTO THE LUNGS EVERY SIX HOURS AS NEEDED FOR WHEEZING/SHORTNESS OF BREATH   amLODipine  10 MG tablet Commonly known as: NORVASC  Take 1 tablet (10 mg total) by mouth daily.   atorvastatin  40 MG tablet Commonly known as: LIPITOR Take 1 tablet (40 mg total) by mouth daily.   buPROPion  200 MG 12 hr tablet Commonly known as: Wellbutrin  SR Take 1 tablet (200 mg total) by mouth 2 (two) times daily.   busPIRone  5 MG tablet Commonly known as: BUSPAR  Take 1 tablet (5 mg total) by mouth 2 (two) times daily. Started by: Charlies Bellini, DO   EPINEPHrine  0.3 mg/0.3 mL Soaj injection Commonly known as: EPI-PEN Inject 0.3 mg into the muscle as needed for anaphylaxis.   esomeprazole  40 MG capsule Commonly known as: NEXIUM  TAKE ONE CAPSULE BY MOUTH ONE TIME DAILY AT NOON   famotidine  20 MG tablet Commonly known as: PEPCID  Take 1 tablet (20 mg total) by mouth daily   FLUoxetine  40 MG capsule Commonly known as: PROZAC  Take 1 capsule (40 mg total) by mouth daily. What changed: Another medication with the same name was added. Make sure you understand how and when to take each. Changed by: Charlies Bellini, DO   FLUoxetine  20 MG capsule Commonly known as: PROZAC  Take 1 capsule (20 mg total) by mouth daily. What changed: You were already taking a medication with  the same name, and this prescription was added. Make sure you understand how and when to take each. Changed by: Charlies Bellini, DO   folic acid  1 MG tablet Commonly known as: FOLVITE  Take 1 tablet (1 mg total) by mouth daily.   levothyroxine  50 MCG tablet Commonly known as: SYNTHROID  Take 1 tablet (50 mcg total) by mouth daily before breakfast.   lisinopril  5 MG tablet Commonly known as: ZESTRIL  Take 1 tablet (5 mg total) by mouth daily. What changed: See the new instructions. Changed by: Charlies Bellini, DO   mirabegron  ER 50 MG Tb24 tablet Commonly known as: MYRBETRIQ  Take 1 tablet (50 mg total) by mouth daily.   Na Sulfate-K Sulfate-Mg Sulfate concentrate 17.5-3.13-1.6 GM/177ML Soln Commonly known as: SUPREP Take 1 kit by mouth once.   OVER THE COUNTER MEDICATION Neura alpha   Vitamin D  50 MCG (2000 UT) tablet        All past medical history, surgical history, allergies, family history, immunizations andmedications were updated in the EMR today and reviewed under the history and medication portions of their EMR.     Review of Systems  Constitutional: Negative.   HENT: Negative.    Eyes: Negative.   Respiratory: Negative.    Cardiovascular: Negative.   Gastrointestinal: Negative.   Genitourinary: Negative.   Musculoskeletal: Negative.   Skin: Negative.   Neurological: Negative.   Endo/Heme/Allergies: Negative.   Psychiatric/Behavioral:  Positive  for depression. Negative for suicidal ideas.    Negative, with the exception of above mentioned in HPI   Objective:  BP 126/78   Pulse 65   Temp 98.3 F (36.8 C)   Wt 167 lb 12.8 oz (76.1 kg)   LMP 10/18/1990   SpO2 98%   BMI 31.71 kg/m  Body mass index is 31.71 kg/m.  Physical Exam Vitals and nursing note reviewed.  Constitutional:      General: She is not in acute distress.    Appearance: Normal appearance. She is normal weight. She is not ill-appearing or toxic-appearing.  HENT:     Head: Normocephalic  and atraumatic.  Eyes:     General: No scleral icterus.       Right eye: No discharge.        Left eye: No discharge.     Extraocular Movements: Extraocular movements intact.     Conjunctiva/sclera: Conjunctivae normal.     Pupils: Pupils are equal, round, and reactive to light.  Skin:    Findings: No rash.  Neurological:     Mental Status: She is alert and oriented to person, place, and time. Mental status is at baseline.     Motor: No weakness.     Coordination: Coordination normal.     Gait: Gait normal.  Psychiatric:        Attention and Perception: Attention normal.        Mood and Affect: Mood is depressed. Affect is tearful.        Speech: Speech normal.        Behavior: Behavior normal. Behavior is cooperative.        Thought Content: Thought content normal. Thought content is not paranoid. Thought content does not include homicidal or suicidal ideation. Thought content does not include homicidal or suicidal plan.        Cognition and Memory: Cognition normal.        Judgment: Judgment normal. Judgment is not impulsive or inappropriate.     No results found. No results found. No results found for this or any previous visit (from the past 24 hours).  Assessment/Plan: Abigail Michael is a 73 y.o. female present for OV for  Major depressive disorder, recurrent episode, moderate (HCC) (Primary) Continue Wellbutrin  200 mg twice daily Increase Prozac  for total of 60 mg daily Added BuSpar  5 mg twice daily Patient has close follow-up with this provider. She does have a therapist.  Situational anxiety Added BuSpar  5 mg tapering to 5 mg twice daily  Reviewed expectations re: course of current medical issues. Discussed self-management of symptoms. Outlined signs and symptoms indicating need for more acute intervention. Patient verbalized understanding and all questions were answered. Patient received an After-Visit Summary.  Return for see you  in [place] at your  scheduled appt.   No orders of the defined types were placed in this encounter.  Meds ordered this encounter  Medications   FLUoxetine  (PROZAC ) 40 MG capsule    Sig: Take 1 capsule (40 mg total) by mouth daily.    Dispense:  90 capsule    Refill:  0    Added to prozac  20 mg tab> total of 60 mg daiy   mirabegron  ER (MYRBETRIQ ) 50 MG TB24 tablet    Sig: Take 1 tablet (50 mg total) by mouth daily.    Dispense:  90 tablet    Refill:  0   buPROPion  (WELLBUTRIN  SR) 200 MG 12 hr tablet    Sig: Take 1 tablet (  200 mg total) by mouth 2 (two) times daily.    Dispense:  180 tablet    Refill:  1   lisinopril  (ZESTRIL ) 5 MG tablet    Sig: Take 1 tablet (5 mg total) by mouth daily.    Dispense:  90 tablet    Refill:  1   FLUoxetine  (PROZAC ) 20 MG capsule    Sig: Take 1 capsule (20 mg total) by mouth daily.    Dispense:  90 capsule    Refill:  1    Added to prozac  40 mg tab> total of 60 mg daiy   busPIRone  (BUSPAR ) 5 MG tablet    Sig: Take 1 tablet (5 mg total) by mouth 2 (two) times daily.    Dispense:  180 tablet    Refill:  1   Referral Orders  No referral(s) requested today     Note is dictated utilizing voice recognition software. Although note has been proof read prior to signing, occasional typographical errors still can be missed. If any questions arise, please do not hesitate to call for verification.   electronically signed by:  Charlies Bellini, DO  [facility] [place]       [1]  Allergies Allergen Reactions   Bee Venom Anaphylaxis    Other reaction(s): Anaphylaxis   Amoxicillin Dermatitis    Yeast infection Other reaction(s): Dermatitis/Yeast Infection

## 2024-10-02 NOTE — Patient Instructions (Addendum)
 60 mg prozac  daily ( a 40 mg tab and a 20 mg tab) Continue Wellbutrin  twice a day And start buspar  5 mg twice a day ( new one for anxiety)  see you in [place] at your scheduled appt          Great to see you today.  I have refilled the medication(s) we provide.   If labs were collected or images ordered, we will inform you of  results once we have received them and reviewed. We will contact you either by echart message, or telephone call.  Please give ample time to the testing facility, and our office to run,  receive and review results. Please do not call inquiring of results, even if you can see them in your chart. We will contact you as soon as we are able. If it has been over 1 week since the test was completed, and you have not yet heard from us , then please call us .    - echart message- for normal results that have been seen by the patient already.   - telephone call: abnormal results or if patient has not viewed results in their echart.  If a referral to a specialist was entered for you, please call us  in 2 weeks if you have not heard from the specialist office to schedule.

## 2024-10-03 DIAGNOSIS — F418 Other specified anxiety disorders: Secondary | ICD-10-CM | POA: Insufficient documentation

## 2024-10-04 ENCOUNTER — Other Ambulatory Visit: Payer: Self-pay

## 2024-10-04 ENCOUNTER — Telehealth: Payer: Self-pay

## 2024-10-04 DIAGNOSIS — Z1231 Encounter for screening mammogram for malignant neoplasm of breast: Secondary | ICD-10-CM

## 2024-10-04 NOTE — Telephone Encounter (Signed)
 Reason for CRM: Patient is wanting to schedule a mammogram. Please call patient.

## 2024-10-04 NOTE — Telephone Encounter (Signed)
 Pt scheduled for Mammo bus with at [place] location per her preference

## 2024-10-14 ENCOUNTER — Other Ambulatory Visit: Payer: Self-pay | Admitting: Family Medicine

## 2024-10-15 ENCOUNTER — Other Ambulatory Visit: Payer: Self-pay | Admitting: Family Medicine

## 2024-10-24 ENCOUNTER — Ambulatory Visit: Admitting: Psychology

## 2024-10-24 ENCOUNTER — Encounter: Payer: Self-pay | Admitting: Psychology

## 2024-10-24 DIAGNOSIS — F4323 Adjustment disorder with mixed anxiety and depressed mood: Secondary | ICD-10-CM | POA: Diagnosis not present

## 2024-10-24 NOTE — Progress Notes (Signed)
 Abigail Michael is a 74 y.o. female patient who participated in the following treatment plan update:   Behavioral Health Treatment Plan  Name:Abigail Michael  MRN: [id number]  Treatment Plan Development Date: 10/24/2024  Strengths: Supportive Relationships, Friends, Hopefulness, and Able to Communicate Effectively  Supports: Family and friends  Theatre Manager of Needs: pt would like to continue to work on speaking up for herself.  Pt wants to continue to be in therapy to have a safe place to talk about life stressors and relationship dynamics.   Treatment Level:outpatient individual therapy.    Client Treatment Preferences:  monthly individual therapy in person or virtual.   Diagnosis: Adjustment Disorder  F43.23  Symptoms:  Depressed or irritable mood. Excessive and/or unrealistic worry that is difficult to control occurring more days than not for at least 6 months about a number of events or activities. Hypervigilance (e.g., feeling constantly on edge, experiencing concentration difficulties). Low self-esteem.  Goals:  Return to previous functioning and reduce or eliminate symptoms., Reduce feelings of sadness and hopelessness thereby improving mood., Reduce anxiety and stress using cognitive and behavioral techniques to manage symptoms., and Improve coping skills by learning effective ways to adapt and manage anxiety.  Objectives: Target Date For All Objectives: 10/24/2025  Enhance problem-solving abilities and develop skills to address and resolve challenges., Interrupt negative thinking and increase positive thoughts and feelings, Build resilience and learn to adapt to stress and hardships., and Stay connected with supportive friends and loved ones  Progress Documentation:   Progressing  Interventions:  Cognitive Behavioral Therapy and Insight-Oriented  Expected duration of treatment: 12 months  Party responsible for implementation of interventions: Abigail Traughber,  LCSW.  This plan has been reviewed and created by the following participants: Abigail Hudman, LCSW   A new plan will be created at least every 12 months.  The patient fully participated in the development of treatment plan with the clinician and verbally consents to such treatment.   Patient Treatment Plan Signature Obtained: No, pending signature via MyChart.      Abigail Rhyner, LCSW

## 2024-10-24 NOTE — Progress Notes (Signed)
 Photographer Health Counselor Initial Adult Exam  Name: Abigail Michael Date: 10/24/2024 MRN: [id number] DOB: Mar 19, 1951 PCP: Catherine Fuller A, DO  Time spent: 12:00pm - 12:55pm     55 minutes  Guardian/Payee:  n/a    Paperwork requested: No   Reason for Visit /Presenting Problem:  Pt present for face-to-face initial assessment update via video.  Pt consents to telehealth video session and is aware of limitations and benefits of virtual sessions.   Location of pt: home Location of therapist: home office.  Pt talked about having her Prozac  increased which has helped to improve her mood.   Pt is working on accepting that she will have to move in March to her daughter [name].  Pt had a conversation with [name] about her worries and [name] was very understanding.  [name] got a storage unit for pt so she won't have to let go of things that she doesn't want to.  Pt's daughter Elenor Dragon will move in with pt mid January and help her pack up her apartment.   Pt has made progress connecting with people and making friends.   She still wants to work on self esteem and sticking up for herself.  Reviewed pt's treatment plan for annual update.  Updated pt's treatment plan  IA.   Pt participated in setting treatment goals.   Plan to meet monthly.  Mental Status Exam: Appearance:   Casual     Behavior:  Appropriate  Motor:  Normal  Speech/Language:   Normal Rate  Affect:  Appropriate  Mood:  normal  Thought process:  normal  Thought content:    WNL  Sensory/Perceptual disturbances:    WNL  Orientation:  oriented to person, place, time/date, and situation  Attention:  Good  Concentration:  Good  Memory:  WNL  Fund of knowledge:   Good  Insight:    Good  Judgment:   Good  Impulse Control:  Good     Reported Symptoms: stress  Risk Assessment: Danger to Self:  No Self-injurious Behavior: No Danger to Others: No Duty to Warn:no Physical Aggression / Violence:No   Access to Firearms a concern: No  Gang Involvement:No  Patient / guardian was educated about steps to take if suicide or homicide risk level increases between visits: n/a While future psychiatric events cannot be accurately predicted, the patient does not currently require acute inpatient psychiatric care and does not currently meet [facility]  involuntary commitment criteria.  Substance Abuse History: Current substance abuse: No     Past Psychiatric History:   Previous psychological history is significant for depression Outpatient Providers:pt has been in therapy in the past. History of Psych Hospitalization: Yes  Psychological Testing: n/a   Abuse History:  Victim of: No., n/a   Report needed: No. Victim of Neglect:No. Perpetrator of n/a  Witness / Exposure to Domestic Violence: No   Protective Services Involvement: No  Witness to [employer] Violence:  No   Family History:  Family History  Problem Relation Age of Onset   Lung cancer Mother    Alcohol abuse Father    Throat cancer Father    Lung cancer Father    Depression Father    Heart attack Father    Breast cancer Sister    Mental illness Sister    Alcohol abuse Brother    Diabetes Brother    Breast cancer Maternal Aunt    Diabetes Maternal Grandmother    Esophageal cancer Paternal Grandfather  Stroke Daughter    Endometriosis Daughter    GER disease Daughter    Heart disease Son    Colon cancer Neg Hx    Rectal cancer Neg Hx    Stomach cancer Neg Hx    Colon polyps Neg Hx     Living situation: the patient lives alone  Pt grew up with both parents.  Pt is one of 7 children and pt is number 2 in the birth order.   Pt's father was alcoholic.   Pt had alot of responsibility growing up.  Both of pt's parents worked.  Pt helped care for her younger siblings.  Pt is close to her siblings.  Family history of mental health issues:  Pt's father had depression.   No childhood abuse.    Sexual Orientation:  Straight  Relationship Status: divorced  Name of spouse / other:n/a If a parent, number of children / ages:pt has two adult daughters.  Support Systems: friends Pt's daughters are a support.  Financial Stress:  No   Income/Employment/Disability: Neurosurgeon: No   Educational History: Education: high school diploma/GED  Religion/Sprituality/World View: Catholic  Any cultural differences that may affect / interfere with treatment:  not applicable   Recreation/Hobbies: music  Stressors: Health issues, upcoming changes in her living situation.   Strengths: Supportive Relationships, Hopefulness, Self Advocate, and Able to Communicate Effectively  Barriers:  none   Legal History: Pending legal issue / charges: The patient has no significant history of legal issues. 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 many MRI w/ positive white matter abnormality. MRI reported stable since 2015. mild progressive  Cognitive loss   Obstructive sleep apnea    on prior neuro notes 10/2016; no results.    Osteoporosis    had been on reclast    Post concussion syndrome 10/2013   ongoing issues with concentration, memory loss, lability; EEG completed and normal.    TBI (traumatic brain injury) (HCC) 2014   fall at [facility], hit head off of floor. No bleed, concussion. Seen neurology since for focus and word finding.    Thyroid  disease    TIA (transient ischemic attack) 11/30/2016   MRI normal. No  lateralization. Speech affected and weakness during a hypotensive episdoe.    Tuberculosis    in her 20's meds for a year   Urinary incontinence     Past Surgical History:  Procedure Laterality Date   BACK SURGERY  02/2015   herniated disc L4/5/6   BREAST BIOPSY  1988   biopsy   catract Bilateral 2023   [place] Around 2015   ESOPHAGO[place]OSCOPY  [place]   [place]    LAPAROSCOPIC HYSTERECTOMY     partial    SHOULDER ARTHROSCOPY W/ LABRAL REPAIR Left 02/13/2016   left shoulder   Study: cognitive eval  06/23/2016   mild cog-linguistic impairment, difficulty with verbal fluency  Study: EEG   04/12/2015   Studies: EEG completed and normal.    Study: MRI  04/28/2016   Impression: numerous non-enhancing flair hyperintense lesions w/in the brain .Suspicious for demyelinating disease such as lyme or MS. Her Lyme test were negative.     Medications: Current Outpatient Medications  Medication Sig Dispense Refill   albuterol  (VENTOLIN  HFA) 108 (90 Base) MCG/ACT inhaler INHALE TWO PUFFS BY MOUTH INTO THE LUNGS EVERY SIX HOURS AS NEEDED FOR WHEEZING/SHORTNESS OF BREATH 8.5 g 11   amLODipine  (NORVASC ) 10 MG tablet Take 1 tablet (10 mg total) by mouth daily. 90 tablet 0   atorvastatin  (LIPITOR) 40 MG tablet Take 1 tablet (40 mg total) by mouth daily. 90 tablet 3   buPROPion  (WELLBUTRIN  SR) 200 MG 12 hr tablet Take 1 tablet (200 mg total) by mouth 2 (two) times daily. 180 tablet 1   busPIRone  (BUSPAR ) 5 MG tablet Take 1 tablet (5 mg total) by mouth 2 (two) times daily. 180 tablet 1   Cholecalciferol (VITAMIN D ) 50 MCG (2000 UT) tablet      EPINEPHrine  0.3 mg/0.3 mL IJ SOAJ injection Inject 0.3 mg into the muscle as needed for anaphylaxis. 1 each 0   esomeprazole  (NEXIUM ) 40 MG capsule TAKE ONE CAPSULE BY MOUTH ONE TIME DAILY AT NOON 90 capsule 0   famotidine  (PEPCID ) 20 MG tablet TAKE 1 TABLET DAILY 90 tablet 0   FLUoxetine  (PROZAC ) 20 MG capsule Take 1  capsule (20 mg total) by mouth daily. 90 capsule 1   FLUoxetine  (PROZAC ) 40 MG capsule Take 1 capsule (40 mg total) by mouth daily. 90 capsule 0   folic acid  (FOLVITE ) 1 MG tablet Take 1 tablet (1 mg total) by mouth daily. 90 tablet 0   levothyroxine  (SYNTHROID ) 50 MCG tablet TAKE 1 TABLET DAILY BEFORE BREAKFAST 30 tablet 0   lisinopril  (ZESTRIL ) 5 MG tablet Take 1 tablet (5 mg total) by mouth daily. 90 tablet 1   mirabegron  ER (MYRBETRIQ ) 50 MG TB24 tablet Take 1 tablet (50 mg total) by mouth daily. 90 tablet 0   Na Sulfate-K Sulfate-Mg Sulfate concentrate (SUPREP) 17.5-3.13-1.6 GM/177ML SOLN Take 1 kit by mouth once.     OVER THE COUNTER MEDICATION Neura alpha     No current facility-administered medications for this visit.    Allergies  Allergen Reactions   Bee Venom Anaphylaxis    Other reaction(s): Anaphylaxis   Amoxicillin Dermatitis    Yeast infection Other reaction(s): Dermatitis/Yeast Infection    Diagnoses:  F43.23  Plan of Care: Recommend ongoing therapy.   Pt participated in setting treatment goals.   Plan to meet monthly.  Pt agrees with treatment plan.    Behavioral Health Treatment Plan  Name:Abigail Michael  MRN: [id number]  Treatment Plan Development Date: 10/24/2024  Strengths: Supportive Relationships, Friends, Hopefulness, and Able to Communicate Effectively  Supports: Family and friends  Theatre Manager of Needs: pt would like to continue to work on speaking up for herself.  Pt wants to continue to be in therapy to have a safe place to talk about life stressors and relationship dynamics.   Treatment Level:outpatient individual therapy.    Client Treatment Preferences:  monthly individual therapy in person or virtual.   Diagnosis: Adjustment Disorder  F43.23  Symptoms:  Depressed or irritable mood. Excessive and/or unrealistic worry that is difficult to control occurring more days than not for at least 6 months about a number of events or activities.  Hypervigilance (e.g., feeling constantly on edge, experiencing  concentration difficulties). Low self-esteem.  Goals:  Return to previous functioning and reduce or eliminate symptoms., Reduce feelings of sadness and hopelessness thereby improving mood., Reduce anxiety and stress using cognitive and behavioral techniques to manage symptoms., and Improve coping skills by learning effective ways to adapt and manage anxiety.  Objectives: Target Date For All Objectives: 10/24/2025  Enhance problem-solving abilities and develop skills to address and resolve challenges., Interrupt negative thinking and increase positive thoughts and feelings, Build resilience and learn to adapt to stress and hardships., and Stay connected with supportive friends and loved ones  Progress Documentation:   Progressing  Interventions:  Cognitive Behavioral Therapy and Insight-Oriented  Expected duration of treatment: 12 months  Party responsible for implementation of interventions: Corrion Stirewalt, LCSW.  This plan has been reviewed and created by the following participants: Journey Castonguay, LCSW   A new plan will be created at least every 12 months.  The patient fully participated in the development of treatment plan with the clinician and verbally consents to such treatment.   Patient Treatment Plan Signature Obtained: No, pending signature via MyChart.      Amiria Orrison, LCSW      "

## 2024-10-30 ENCOUNTER — Ambulatory Visit: Admitting: Family Medicine

## 2024-10-30 ENCOUNTER — Encounter: Payer: Self-pay | Admitting: Family Medicine

## 2024-10-30 VITALS — BP 124/76 | HR 59 | Temp 98.2°F | Wt 167.2 lb

## 2024-10-30 DIAGNOSIS — F419 Anxiety disorder, unspecified: Secondary | ICD-10-CM

## 2024-10-30 DIAGNOSIS — F331 Major depressive disorder, recurrent, moderate: Secondary | ICD-10-CM | POA: Diagnosis not present

## 2024-10-30 DIAGNOSIS — K219 Gastro-esophageal reflux disease without esophagitis: Secondary | ICD-10-CM

## 2024-10-30 MED ORDER — ONDANSETRON 4 MG PO TBDP: 4.0000 mg | ORAL_TABLET | Freq: Three times a day (TID) | ORAL | 5 refills | Status: DC | PRN

## 2024-10-30 MED ORDER — FAMOTIDINE 20 MG PO TABS: 20.0000 mg | ORAL_TABLET | Freq: Every day | ORAL | 1 refills | Status: AC

## 2024-10-30 MED ORDER — ESOMEPRAZOLE MAGNESIUM 40 MG PO CPDR: DELAYED_RELEASE_CAPSULE | ORAL | 1 refills | Status: AC

## 2024-10-30 MED ORDER — ONDANSETRON 4 MG PO TBDP: 4.0000 mg | ORAL_TABLET | Freq: Three times a day (TID) | ORAL | 5 refills | Status: AC | PRN

## 2024-10-30 NOTE — Progress Notes (Signed)
 "      Abigail Michael , [birth year], 74 y.o., female MRN: [id number] Patient Care Team    Relationship Specialty Notifications Start End  Catherine Charlies LABOR, DO PCP - General Family Medicine  01/16/18   Asencion Rush, MD Referring Physician Neurology  08/28/18   Megan  Dentistry  07/17/19   [name]  Orthodontics  07/17/19   Alex Hamilton R  Oral Surgery  07/17/19   Charmel Ivan Hamming, MD Referring Physician Psychiatry  07/17/19    Comment: Neurology  Lavonia Lye, MD Consulting Physician Ophthalmology  12/16/21    Comment: cataract    Chief Complaint  Patient presents with   Medication Reaction    Pt started Semaglutide last week; since has been experiencing sx of GERD. Pt has tried Nexium .      Subjective: Abigail Michael is a 74 y.o. Pt presents for an OV with complaints of worsening anxiety and worsening reflux.  Patient reports her reflux has worsened since she started on Wegovy last week.  She has known history of reflux and is prescribed Pepcid  and PPI.  Patient also reports having increased in anxiety surrounding an upcoming move she is having made to [employer].  She is unhappy with the thought of having to get rid of a lot of her personal belongings to downsize.     10/31/2024   10:21 AM 10/03/2024    3:57 PM 09/19/2024   10:19 AM 05/02/2024   10:44 AM 01/18/2024    9:00 AM  Depression screen PHQ 2/9  Decreased Interest 0 2 0 0 0  Down, Depressed, Hopeless 0 2 0 0 0  PHQ - 2 Score 0 4 0 0 0  Altered sleeping 0 1 0 1 0  Tired, decreased energy 1 1 0 0 0  Change in appetite 0 1 0 1 0  Feeling bad or failure about yourself  0 0 0 0 0  Trouble concentrating 0 0 0 0 0  Moving slowly or fidgety/restless 0 0 0 0 0  Suicidal thoughts 0 0  0 0  PHQ-9 Score 1 7 0 2  0   Difficult doing work/chores Somewhat difficult Somewhat difficult Not difficult at all Not difficult at all Not difficult at all     Data saved with a previous flowsheet row definition     Allergies[1] Social History   Social History Narrative   Single. Lived in [place] area, moved to [place] to be close to her daughters 2018. Lives with her daughter.   Some college, worked as Sports Coach. Retired.    Social drinker. Former smoker.   Exercises routinely.    Wears dentures.    Drinks caffeine, takes a daily vitamin.   Smoke alarm in the home. Wears her seatbelt.    Feels safe in her relationships.   3-4 cups per day.   Four children (2 biological, 2 she raised as her own).   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  many MRI w/ positive white matter abnormality. MRI reported stable since 2015. mild progressive  Cognitive loss   Obstructive sleep apnea    on prior neuro notes 10/2016; no results.    Osteoporosis    had been on reclast    Post concussion syndrome 10/2013   ongoing issues with concentration, memory loss, lability; EEG completed and normal.    TBI (traumatic brain injury) (HCC) 2014   fall at [facility], hit head off of floor. No bleed, concussion. Seen neurology since for focus and word finding.    Thyroid  disease    TIA (transient ischemic attack) 11/30/2016   MRI normal. No lateralization. Speech affected and weakness during a hypotensive episdoe.    Tuberculosis    in her 20's meds for a year   Urinary incontinence    Past Surgical History:  Procedure Laterality Date   BACK SURGERY  02/2015   herniated disc L4/5/6   BREAST BIOPSY  1988   biopsy   catract Bilateral 2023   [place]ONOSCOPY     [place] . Around 2015    ESOPHAGOGASTRODUODENOSCOPY  2016   [place]    LAPAROSCOPIC HYSTERECTOMY     partial    SHOULDER ARTHROSCOPY W/ LABRAL REPAIR Left 02/13/2016   left shoulder   Study: cognitive eval  06/23/2016   mild cog-linguistic impairment, difficulty with verbal fluency   Study: EEG   04/12/2015   Studies: EEG completed and normal.    Study: MRI  04/28/2016   Impression: numerous non-enhancing flair hyperintense lesions w/in the brain .Suspicious for demyelinating disease such as lyme or MS. Her Lyme test were negative.    Family History  Problem Relation Age of Onset   Lung cancer Mother    Alcohol abuse Father    Throat cancer Father    Lung cancer Father    Depression Father    Heart attack Father    Breast cancer Sister    Mental illness Sister    Alcohol abuse Brother    Diabetes Brother    Breast cancer Maternal Aunt    Diabetes Maternal Grandmother    Esophageal cancer Paternal Grandfather    Stroke Daughter    Endometriosis Daughter    GER disease Daughter    Heart disease Son    Colon cancer Neg Hx    Rectal cancer Neg Hx    Stomach cancer Neg Hx    Colon polyps Neg Hx    Allergies as of 10/30/2024       Reactions   Bee Venom Anaphylaxis   Other reaction(s): Anaphylaxis   Amoxicillin Dermatitis   Yeast infection Other reaction(s): Dermatitis/Yeast Infection        Medication List        Accurate as of October 30, 2024 11:59 PM. If you have any questions, ask your nurse or doctor.          albuterol  108 (90 Base) MCG/ACT inhaler Commonly known as: VENTOLIN  HFA INHALE TWO PUFFS BY MOUTH INTO THE LUNGS EVERY SIX HOURS AS NEEDED FOR WHEEZING/SHORTNESS OF BREATH   amLODipine  10 MG tablet Commonly known as: NORVASC  Take 1 tablet (10 mg total) by mouth daily.   atorvastatin  40 MG tablet Commonly known as: LIPITOR Take 1 tablet (40 mg total) by mouth daily.   buPROPion  200 MG 12 hr tablet Commonly known as: Wellbutrin  SR Take 1 tablet (200 mg total) by  mouth 2 (two) times daily.   busPIRone  5 MG tablet Commonly known as: BUSPAR  Take 1 tablet (5 mg  total) by mouth 2 (two) times daily.   EPINEPHrine  0.3 mg/0.3 mL Soaj injection Commonly known as: EPI-PEN Inject 0.3 mg into the muscle as needed for anaphylaxis.   esomeprazole  40 MG capsule Commonly known as: NEXIUM  TAKE ONE CAPSULE BY MOUTH ONE TIME DAILY AT NOON   famotidine  20 MG tablet Commonly known as: PEPCID  Take 1 tablet (20 mg total) by mouth daily.   FLUoxetine  40 MG capsule Commonly known as: PROZAC  Take 1 capsule (40 mg total) by mouth daily.   FLUoxetine  20 MG capsule Commonly known as: PROZAC  Take 1 capsule (20 mg total) by mouth daily.   folic acid  1 MG tablet Commonly known as: FOLVITE  Take 1 tablet (1 mg total) by mouth daily.   lisinopril  5 MG tablet Commonly known as: ZESTRIL  Take 1 tablet (5 mg total) by mouth daily.   mirabegron  ER 50 MG Tb24 tablet Commonly known as: MYRBETRIQ  Take 1 tablet (50 mg total) by mouth daily.   Na Sulfate-K Sulfate-Mg Sulfate concentrate 17.5-3.13-1.6 GM/177ML Soln Commonly known as: SUPREP Take 1 kit by mouth once.   ondansetron  4 MG disintegrating tablet Commonly known as: ZOFRAN -ODT Take 1 tablet (4 mg total) by mouth every 8 (eight) hours as needed for nausea or vomiting. Started by: Charlies Bellini, DO   OVER THE COUNTER MEDICATION Neura alpha   SEMAGLUTIDE(0.25 OR 0.5MG /DOS) [place] Inject into the skin.   Synthroid  50 MCG tablet Generic drug: levothyroxine  TAKE 1 TABLET DAILY BEFORE BREAKFAST   Vitamin D  50 MCG (2000 UT) tablet        All past medical history, surgical history, allergies, family history, immunizations andmedications were updated in the EMR today and reviewed under the history and medication portions of their EMR.     ROS Negative, with the exception of above mentioned in HPI   Objective:  BP 124/76   Pulse (!) 59   Temp 98.2 F (36.8 C)   Wt 167 lb 3.2 oz (75.8 kg)   LMP  10/18/1990   SpO2 98%   BMI 31.59 kg/m  Body mass index is 31.59 kg/m.  Physical Exam Vitals and nursing note reviewed.  Constitutional:      General: She is not in acute distress.    Appearance: Normal appearance. She is not ill-appearing, toxic-appearing or diaphoretic.  HENT:     Head: Normocephalic and atraumatic.  Eyes:     General: No scleral icterus.       Right eye: No discharge.        Left eye: No discharge.     Extraocular Movements: Extraocular movements intact.     Conjunctiva/sclera: Conjunctivae normal.     Pupils: Pupils are equal, round, and reactive to light.  Cardiovascular:     Rate and Rhythm: Normal rate and regular rhythm.  Pulmonary:     Effort: Pulmonary effort is normal. No respiratory distress.     Breath sounds: Normal breath sounds. No wheezing, rhonchi or rales.  Abdominal:     General: Abdomen is flat. There is distension.     Palpations: Abdomen is soft. There is no mass.     Tenderness: There is no abdominal tenderness. There is no guarding or rebound.     Hernia: No hernia is present.  Musculoskeletal:     Right lower leg: No edema.     Left lower leg: No edema.  Skin:    General: Skin is warm.     Findings: No rash.  Neurological:     Mental Status:  She is alert and oriented to person, place, and time. Mental status is at baseline.     Motor: No weakness.     Gait: Gait normal.  Psychiatric:        Mood and Affect: Mood normal.        Behavior: Behavior normal.        Thought Content: Thought content normal.        Judgment: Judgment normal.      No results found. No results found. No results found for this or any previous visit (from the past 24 hours).  Assessment/Plan: Abigail Michael is a 74 y.o. female present for OV for  Gastroesophageal reflux disease without esophagitis (Primary) Continue Nexium  40 mg daily Continue Pepcid  20 mg Consider over-the-counter Gaviscon or Gas-X etc. Refilled Zofran  for her> advised her  that GLP-1 injections are known for worsening reflux in some patients.  Encouraged her to increase hydration efforts can help.   Major depressive disorder, recurrent episode, moderate (HCC) Continue Wellbutrin  200 mg twice daily short acting Continue Prozac  60 mg daily  Anxiety Added BuSpar  5 mg twice daily  Reviewed expectations re: course of current medical issues. Discussed self-management of symptoms. Outlined signs and symptoms indicating need for more acute intervention. Patient verbalized understanding and all questions were answered. Patient received an After-Visit Summary.    No orders of the defined types were placed in this encounter.  Meds ordered this encounter  Medications   esomeprazole  (NEXIUM ) 40 MG capsule    Sig: TAKE ONE CAPSULE BY MOUTH ONE TIME DAILY AT NOON    Dispense:  90 capsule    Refill:  1   DISCONTD: ondansetron  (ZOFRAN -ODT) 4 MG disintegrating tablet    Sig: Take 1 tablet (4 mg total) by mouth every 8 (eight) hours as needed for nausea or vomiting.    Dispense:  20 tablet    Refill:  5   ondansetron  (ZOFRAN -ODT) 4 MG disintegrating tablet    Sig: Take 1 tablet (4 mg total) by mouth every 8 (eight) hours as needed for nausea or vomiting.    Dispense:  20 tablet    Refill:  5   famotidine  (PEPCID ) 20 MG tablet    Sig: Take 1 tablet (20 mg total) by mouth daily.    Dispense:  90 tablet    Refill:  1   Referral Orders  No referral(s) requested today     Note is dictated utilizing voice recognition software. Although note has been proof read prior to signing, occasional typographical errors still can be missed. If any questions arise, please do not hesitate to call for verification.   electronically signed by:  Charlies Bellini, DO  [facility] [place]       [1]  Allergies Allergen Reactions   Bee Venom Anaphylaxis    Other reaction(s): Anaphylaxis   Amoxicillin Dermatitis    Yeast infection Other reaction(s): Dermatitis/Yeast  Infection   "

## 2024-11-02 ENCOUNTER — Ambulatory Visit: Admitting: Family Medicine

## 2024-11-08 ENCOUNTER — Encounter: Payer: Self-pay | Admitting: Family Medicine

## 2024-11-08 ENCOUNTER — Ambulatory Visit (INDEPENDENT_AMBULATORY_CARE_PROVIDER_SITE_OTHER): Admitting: Family Medicine

## 2024-11-08 VITALS — BP 122/72 | HR 66 | Temp 98.0°F | Wt 163.8 lb

## 2024-11-08 DIAGNOSIS — F419 Anxiety disorder, unspecified: Secondary | ICD-10-CM | POA: Diagnosis not present

## 2024-11-08 DIAGNOSIS — E210 Primary hyperparathyroidism: Secondary | ICD-10-CM

## 2024-11-08 DIAGNOSIS — N3281 Overactive bladder: Secondary | ICD-10-CM | POA: Diagnosis not present

## 2024-11-08 DIAGNOSIS — B351 Tinea unguium: Secondary | ICD-10-CM

## 2024-11-08 DIAGNOSIS — F418 Other specified anxiety disorders: Secondary | ICD-10-CM | POA: Diagnosis not present

## 2024-11-08 DIAGNOSIS — M810 Age-related osteoporosis without current pathological fracture: Secondary | ICD-10-CM

## 2024-11-08 DIAGNOSIS — G3184 Mild cognitive impairment, so stated: Secondary | ICD-10-CM | POA: Diagnosis not present

## 2024-11-08 DIAGNOSIS — E782 Mixed hyperlipidemia: Secondary | ICD-10-CM | POA: Diagnosis not present

## 2024-11-08 DIAGNOSIS — F331 Major depressive disorder, recurrent, moderate: Secondary | ICD-10-CM

## 2024-11-08 DIAGNOSIS — Z87820 Personal history of traumatic brain injury: Secondary | ICD-10-CM

## 2024-11-08 DIAGNOSIS — E063 Autoimmune thyroiditis: Secondary | ICD-10-CM | POA: Diagnosis not present

## 2024-11-08 LAB — TSH: TSH: 2.47 u[IU]/mL (ref 0.35–5.50)

## 2024-11-08 LAB — T4, FREE: Free T4: 1.31 ng/dL (ref 0.60–1.60)

## 2024-11-08 MED ORDER — ATORVASTATIN CALCIUM 40 MG PO TABS: 40.0000 mg | ORAL_TABLET | Freq: Every day | ORAL | 3 refills | Status: AC

## 2024-11-08 MED ORDER — AMLODIPINE BESYLATE 10 MG PO TABS: 10.0000 mg | ORAL_TABLET | Freq: Every day | ORAL | 1 refills | Status: AC

## 2024-11-08 MED ORDER — FLUOXETINE HCL 40 MG PO CAPS: 40.0000 mg | ORAL_CAPSULE | Freq: Every day | ORAL | 1 refills | Status: AC

## 2024-11-08 MED ORDER — TERBINAFINE HCL 250 MG PO TABS: 250.0000 mg | ORAL_TABLET | Freq: Every day | ORAL | 0 refills | Status: AC

## 2024-11-08 MED ORDER — LEVOTHYROXINE SODIUM 50 MCG PO TABS: 50.0000 ug | ORAL_TABLET | Freq: Every day | ORAL | 3 refills | Status: AC

## 2024-11-08 MED ORDER — BUPROPION HCL ER (SR) 200 MG PO TB12: 200.0000 mg | ORAL_TABLET | Freq: Two times a day (BID) | ORAL | 1 refills | Status: AC

## 2024-11-08 MED ORDER — MIRABEGRON ER 50 MG PO TB24: 50.0000 mg | ORAL_TABLET | Freq: Every day | ORAL | 0 refills | Status: AC

## 2024-11-08 MED ORDER — FOLIC ACID 1 MG PO TABS: 1.0000 mg | ORAL_TABLET | Freq: Every day | ORAL | 3 refills | Status: AC

## 2024-11-08 NOTE — Patient Instructions (Signed)
 Return in about 24 weeks (around 04/25/2025) for Routine chronic condition follow-up.        Great to see you today.  I have refilled the medication(s) we provide.   If labs were collected or images ordered, we will inform you of  results once we have received them and reviewed. We will contact you either by echart message, or telephone call.  Please give ample time to the testing facility, and our office to run,  receive and review results. Please do not call inquiring of results, even if you can see them in your chart. We will contact you as soon as we are able. If it has been over 1 week since the test was completed, and you have not yet heard from us , then please call us .    - echart message- for normal results that have been seen by the patient already.   - telephone call: abnormal results or if patient has not viewed results in their echart.  If a referral to a specialist was entered for you, please call us  in 2 weeks if you have not heard from the specialist office to schedule.

## 2024-11-08 NOTE — Patient Instructions (Signed)
         Great to see you today.  I have refilled the medication(s) we provide.   If labs were collected or images ordered, we will inform you of  results once we have received them and reviewed. We will contact you either by echart message, or telephone call.  Please give ample time to the testing facility, and our office to run,  receive and review results. Please do not call inquiring of results, even if you can see them in your chart. We will contact you as soon as we are able. If it has been over 1 week since the test was completed, and you have not yet heard from us , then please call us .    - echart message- for normal results that have been seen by the patient already.   - telephone call: abnormal results or if patient has not viewed results in their echart.  If a referral to a specialist was entered for you, please call us  in 2 weeks if you have not heard from the specialist office to schedule.

## 2024-11-08 NOTE — Progress Notes (Signed)
 "    Patient ID: Abigail Michael, female  DOB: 09-13-1951, 74 y.o.   MRN: [id number] Patient Care Team    Relationship Specialty Notifications Start End  Catherine Charlies LABOR, DO PCP - General Family Medicine  01/16/18   Asencion Rush, MD Referring Physician Neurology  08/28/18   Megan  Dentistry  07/17/19   [name]  Orthodontics  07/17/19   Alex Hamilton R  Oral Surgery  07/17/19   Charmel Ivan Hamming, MD Referring Physician Psychiatry  07/17/19    Comment: Neurology  Lavonia Lye, MD Consulting Physician Ophthalmology  12/16/21    Comment: cataract    Chief Complaint  Patient presents with   Hypertension    Subjective: Abigail Michael is a 74 y.o.  female present for Chronic Conditions/illness Management Medication reconciliation completed today. Past medical history updated with any changes if appropriate.  Depression with anxiety Patient is prescribed Wellbutrin  200 BID and Prozac  60 mg qd for her depression.  Buspar  5 mg twice daily was added last visit and she reports she feels like she is coping better.  She is trying to get a better outlook on the changes that are upcoming with her move, and feels it is working for her.  Prior note She states her depression worsened recently after her ex-husband passed away, and there is no pension for part, therefore she found that her financial affairs are not allowing her to maintain her apartment locally.  She is going to opt to move in with her daughter at [facility].  She reports this is upsetting to her because all of her friends or in the area here, which she also notes she is very lucky to have a family that is so supportive. She reports this is an increase stress level for her.  She does admit to drinking more alcohol to help cope with the changes.  Her daughters have brought it to her attention and she has cut back. It is upsetting to her to think she has to start all over again and make new friends.   Essential  hypertension/HLD/morbid obesity/h/o TIA: Pt reports compliance with amlodipine  10 mg QD and lisinopril  5 mg daily. Patient denies chest pain, shortness of breath, dizziness or lower extremity edema.   Pt takes a  daily baby ASA. Pt is  prescribed statin. H/O TIA.  Diet: Low sodium Exercise: Patient reports she has become more active getting out walking more routinely and feeling better. RF: HTN, HLD, Obesity, h/o CVA, FHX MI   GERD/PPI:  Patient reports compliance with Nexium  and Pepcid .    .  Symptoms improved   Hypothyroidism:  Patient reports compliance with 50 mcg levothyroxine  daily-which was increased last visit due to elevated TSH.     Overactive bladder/MS-white matter disease/TIA/TBI/ataxia: Patient reports compliance with her Myrbetriq  and it is working well or her.  She had been prescribed Aricept  10 mg nightly, she has discontinued that medication.   RAD: Pt reports she feels winded with dancing and exercising.she has been able to perform these activities without feeling winded until she recently had covid. Otherwise she feels well.  Albuterol  inhaler was prescribed for her to use 1 to 2 puffs prior to activity last visit, she states this works very well for her and has fixed the issue.  Toenail fungus: Patient reports she had fungus infection on her nails in the past, and was able to take a medication to resolve.  However it has returned and now is on bilateral toenails affecting all  10 toes.     10/31/2024   10:21 AM 10/03/2024    3:57 PM 09/19/2024   10:19 AM 05/02/2024   10:44 AM 01/18/2024    9:00 AM  Depression screen PHQ 2/9  Decreased Interest 0 2 0 0 0  Down, Depressed, Hopeless 0 2 0 0 0  PHQ - 2 Score 0 4 0 0 0  Altered sleeping 0 1 0 1 0  Tired, decreased energy 1 1 0 0 0  Change in appetite 0 1 0 1 0  Feeling bad or failure about yourself  0 0 0 0 0  Trouble concentrating 0 0 0 0 0  Moving slowly or fidgety/restless 0 0 0 0 0  Suicidal thoughts 0 0  0 0   PHQ-9 Score 1 7 0 2  0   Difficult doing work/chores Somewhat difficult Somewhat difficult Not difficult at all Not difficult at all Not difficult at all     Data saved with a previous flowsheet row definition      10/31/2024   10:21 AM 10/03/2024    3:58 PM 05/02/2024   10:45 AM 01/18/2024    9:00 AM  GAD 7 : Generalized Anxiety Score  Nervous, Anxious, on Edge 0  1  0  0   Control/stop worrying 0  1  0  0   Worry too much - different things 0  1  0  0   Trouble relaxing 0  1  0  0   Restless 0  1  0  0   Easily annoyed or irritable 0  1  0  0   Afraid - awful might happen 0  1  0  0   Total GAD 7 Score 0 7 0 0  Anxiety Difficulty Not difficult at all Somewhat difficult Not difficult at all Not difficult at all     Data saved with a previous flowsheet row definition          10/31/2024   10:21 AM 09/19/2024   10:15 AM 05/02/2024   10:44 AM 01/18/2024    9:01 AM 01/18/2024    9:00 AM  Fall Risk   Falls in the past year? 0 0 1 1 0  Number falls in past yr: 0 0 0 0   Injury with Fall? 0 0 1  1    Risk for fall due to : No Fall Risks No Fall Risks     Follow up Falls evaluation completed Falls evaluation completed;Education provided;Falls prevention discussed Falls evaluation completed Falls evaluation completed;Education provided;Falls prevention discussed Falls evaluation completed     Data saved with a previous flowsheet row definition     Immunization History  Administered Date(s) Administered   Fluad Quad(high Dose 65+) 09/16/2020, 07/03/2021, 08/26/2022   INFLUENZA, HIGH DOSE SEASONAL PF 08/28/2018, 06/13/2019   Influenza Split 07/26/2012   Influenza, Seasonal, Injecte, Preservative Fre 07/24/2010   Influenza-Unspecified 07/19/2023   PFIZER(Purple Top)SARS-COV-2 Vaccination 11/09/2019, 12/03/2019, 09/16/2020   Pneumococcal Conjugate,unspecified 12/02/2020   Pneumococcal Conjugate-13 12/02/2020   Pneumococcal Polysaccharide-23 08/28/2018   Td (Adult),unspecified  10/18/1998   Tdap 02/06/2007, 12/03/2020, 05/04/2023   Zoster Recombinant(Shingrix) 05/10/2019, 09/10/2019   Zoster, Live 06/16/2011    No results found.  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 many MRI w/ positive white matter abnormality. MRI reported stable since 2015. mild progressive  Cognitive loss   Obstructive sleep apnea    on prior neuro notes 10/2016; no results.    Osteoporosis    had been on reclast    Post concussion syndrome 10/2013   ongoing issues with concentration, memory loss, lability; EEG completed and normal.    TBI (traumatic brain injury) (HCC) 2014   fall at [facility], hit head off of floor. No bleed, concussion. Seen neurology since for focus and word finding.    Thyroid  disease    TIA (transient ischemic attack) 11/30/2016   MRI normal. No lateralization. Speech affected and weakness during a hypotensive episdoe.    Tuberculosis    in her 20's meds for a year   Urinary incontinence    Allergies  Allergen Reactions   Bee Venom Anaphylaxis    Other reaction(s): Anaphylaxis   Amoxicillin Dermatitis    Yeast infection Other reaction(s): Dermatitis/Yeast Infection   Past Surgical History:  Procedure Laterality Date   BACK SURGERY  02/2015   herniated disc L4/5/6   BREAST BIOPSY  1988   biopsy   catract Bilateral 2023   [place] . Around 2015   ESOPHAGO[place]ENOSCOPY  [place]    LAPAROSCOPIC HYSTERECTOMY     partial    SHOULDER ARTHROSCOPY W/ LABRAL REPAIR Left 02/13/2016   left shoulder   Study:  cognitive eval  06/23/2016   mild cog-linguistic impairment, difficulty with verbal fluency   Study: EEG   04/12/2015   Studies: EEG completed and normal.    Study: MRI  04/28/2016   Impression: numerous non-enhancing flair hyperintense lesions w/in the brain .Suspicious for demyelinating disease such as lyme or MS. Her Lyme test were negative.    Family History  Problem Relation Age of Onset   Lung cancer Mother    Alcohol abuse Father    Throat cancer Father    Lung cancer Father    Depression Father    Heart attack Father    Breast cancer Sister    Mental illness Sister    Alcohol abuse Brother    Diabetes Brother    Breast cancer Maternal Aunt    Diabetes Maternal Grandmother    Esophageal cancer Paternal Grandfather    Stroke Daughter    Endometriosis Daughter    GER disease Daughter    Heart disease Son    Colon cancer Neg Hx    Rectal cancer Neg Hx    Stomach cancer Neg Hx    Colon polyps Neg Hx    Social History   Socioeconomic History   Marital status: Single    Spouse name: Not on file   Number of children: 4   Years of education: two years of college   Highest education level: GED or equivalent  Occupational History   Occupation: retired  Tobacco Use   Smoking status: Former    Current packs/day: 0.00    Average packs/day: 1 pack/day for 19.0 years (19.0 ttl pk-yrs)    Types: Cigarettes    Start date: 52    Quit date: 2012    Years since  quitting: 14.0   Smokeless tobacco: Never   Tobacco comments:    quit 6 years ago  Vaping Use   Vaping status: Never Used  Substance and Sexual Activity   Alcohol use: Yes    Alcohol/week: 1.0 standard drink of alcohol    Types: 1 Glasses of wine per week    Comment: twice a month   Drug use: Never   Sexual activity: Not Currently    Partners: Male  Other Topics Concern   Not on file  Social History Narrative   Single. Lived in [place] area, moved to [place] to be close to her daughters 2018. Lives with her  daughter.   Some college, worked as Sports Coach. Retired.    Social drinker. Former smoker.   Exercises routinely.    Wears dentures.    Drinks caffeine, takes a daily vitamin.   Smoke alarm in the home. Wears her seatbelt.    Feels safe in her relationships.   3-4 cups per day.   Four children (2 biological, 2 she raised as her own).   Social Drivers of Health   Tobacco Use: Medium Risk (11/08/2024)   Patient History    Smoking Tobacco Use: Former    Smokeless Tobacco Use: Never    Passive Exposure: Not on file  Financial Resource Strain: Low Risk (09/07/2023)   Overall Financial Resource Strain (CARDIA)    Difficulty of Paying Living Expenses: Not hard at all  Food Insecurity: No Food Insecurity (09/19/2024)   Epic    Worried About Programme Researcher, Broadcasting/film/video in the Last Year: Never true    Ran Out of Food in the Last Year: Never true  Transportation Needs: No Transportation Needs (09/19/2024)   Epic    Lack of Transportation (Medical): No    Lack of Transportation (Non-Medical): No  Physical Activity: Insufficiently Active (09/19/2024)   Exercise Vital Sign    Days of Exercise per Week: 4 days    Minutes of Exercise per Session: 30 min  Stress: No Stress Concern Present (09/19/2024)   [employer] of Occupational Health - Occupational Stress Questionnaire    Feeling of Stress: Not at all  Social Connections: Unknown (09/19/2024)   Social Connection and Isolation Panel    Frequency of Communication with Friends and Family: More than three times a week    Frequency of Social Gatherings with Friends and Family: Three times a week    Attends Religious Services: Patient declined    Active Member of Clubs or Organizations: No    Attends Banker Meetings: More than 4 times per year    Marital Status: Not on file  Intimate Partner Violence: Not At Risk (09/19/2024)   Epic    Fear of Current or Ex-Partner: No    Emotionally Abused: No    Physically Abused: No    Sexually  Abused: No  Depression (PHQ2-9): Low Risk (10/31/2024)   Depression (PHQ2-9)    PHQ-2 Score: 1  Recent Concern: Depression (PHQ2-9) - Medium Risk (10/03/2024)   Depression (PHQ2-9)    PHQ-2 Score: 7  Alcohol Screen: Low Risk (12/12/2021)   Alcohol Screen    Last Alcohol Screening Score (AUDIT): 4  Housing: Unknown (09/19/2024)   Epic    Unable to Pay for Housing in the Last Year: No    Number of Times Moved in the Last Year: Not on file    Homeless in the Last Year: No  Utilities: Not At Risk (09/19/2024)   Epic  Threatened with loss of utilities: No  Health Literacy: Adequate Health Literacy (09/19/2024)   B1300 Health Literacy    Frequency of need for help with medical instructions: Never   Allergies as of 11/08/2024       Reactions   Bee Venom Anaphylaxis   Other reaction(s): Anaphylaxis   Amoxicillin Dermatitis   Yeast infection Other reaction(s): Dermatitis/Yeast Infection        Medication List        Accurate as of November 08, 2024 11:22 AM. If you have any questions, ask your nurse or doctor.          albuterol  108 (90 Base) MCG/ACT inhaler Commonly known as: VENTOLIN  HFA INHALE TWO PUFFS BY MOUTH INTO THE LUNGS EVERY SIX HOURS AS NEEDED FOR WHEEZING/SHORTNESS OF BREATH   amLODipine  10 MG tablet Commonly known as: NORVASC  Take 1 tablet (10 mg total) by mouth daily.   atorvastatin  40 MG tablet Commonly known as: LIPITOR Take 1 tablet (40 mg total) by mouth daily.   buPROPion  200 MG 12 hr tablet Commonly known as: Wellbutrin  SR Take 1 tablet (200 mg total) by mouth 2 (two) times daily.   busPIRone  5 MG tablet Commonly known as: BUSPAR  Take 1 tablet (5 mg total) by mouth 2 (two) times daily.   EPINEPHrine  0.3 mg/0.3 mL Soaj injection Commonly known as: EPI-PEN Inject 0.3 mg into the muscle as needed for anaphylaxis.   esomeprazole  40 MG capsule Commonly known as: NEXIUM  TAKE ONE CAPSULE BY MOUTH ONE TIME DAILY AT NOON   famotidine  20 MG  tablet Commonly known as: PEPCID  Take 1 tablet (20 mg total) by mouth daily.   FLUoxetine  20 MG capsule Commonly known as: PROZAC  Take 1 capsule (20 mg total) by mouth daily.   FLUoxetine  40 MG capsule Commonly known as: PROZAC  Take 1 capsule (40 mg total) by mouth daily.   folic acid  1 MG tablet Commonly known as: FOLVITE  Take 1 tablet (1 mg total) by mouth daily.   levothyroxine  50 MCG tablet Commonly known as: Synthroid  Take 1 tablet (50 mcg total) by mouth daily before breakfast.   lisinopril  5 MG tablet Commonly known as: ZESTRIL  Take 1 tablet (5 mg total) by mouth daily.   mirabegron  ER 50 MG Tb24 tablet Commonly known as: MYRBETRIQ  Take 1 tablet (50 mg total) by mouth daily.   Na Sulfate-K Sulfate-Mg Sulfate concentrate 17.5-3.13-1.6 GM/177ML Soln Commonly known as: SUPREP Take 1 kit by mouth once.   ondansetron  4 MG disintegrating tablet Commonly known as: ZOFRAN -ODT Take 1 tablet (4 mg total) by mouth every 8 (eight) hours as needed for nausea or vomiting.   OVER THE COUNTER MEDICATION Neura alpha   SEMAGLUTIDE(0.25 OR 0.5MG /DOS) [place] Inject into the skin.   terbinafine  250 MG tablet Commonly known as: LAMISIL  Take 1 tablet (250 mg total) by mouth daily. Started by: Charlies Bellini, DO   Vitamin D  50 MCG (2000 UT) tablet        All past medical history, surgical history, allergies, family history, immunizations andmedications were updated in the EMR today and reviewed under the history and medication portions of their EMR.     No results found for this or any previous visit (from the past 2160 hours).    Patient was never admitted.   ROS: 14 pt review of systems performed and negative (unless mentioned in an HPI)  Objective: BP 122/72   Pulse 66   Temp 98 F (36.7 C)   Wt 163 lb 12.8 oz (  74.3 kg)   LMP 10/18/1990   SpO2 95%   BMI 30.95 kg/m  Physical Exam Vitals and nursing note reviewed.  Constitutional:      General: She is not in  acute distress.    Appearance: Normal appearance. She is not ill-appearing, toxic-appearing or diaphoretic.  HENT:     Head: Normocephalic and atraumatic.  Eyes:     General: No scleral icterus.       Right eye: No discharge.        Left eye: No discharge.     Extraocular Movements: Extraocular movements intact.     Conjunctiva/sclera: Conjunctivae normal.     Pupils: Pupils are equal, round, and reactive to light.  Cardiovascular:     Rate and Rhythm: Normal rate and regular rhythm.     Heart sounds: No murmur heard.    No gallop.  Pulmonary:     Effort: Pulmonary effort is normal. No respiratory distress.     Breath sounds: Normal breath sounds. No wheezing, rhonchi or rales.  Musculoskeletal:     Cervical back: Neck supple.     Right lower leg: No edema.     Left lower leg: No edema.     Comments: Thick yellowed toenails x 10  Skin:    General: Skin is warm and dry.     Coloration: Skin is not jaundiced or pale.     Findings: No erythema or rash.  Neurological:     Mental Status: She is alert and oriented to person, place, and time. Mental status is at baseline.     Motor: No weakness.     Gait: Gait normal.  Psychiatric:        Mood and Affect: Mood normal.        Behavior: Behavior normal.        Thought Content: Thought content normal.        Judgment: Judgment normal.    Assessment/plan: Maci Eickholt is a 74 y.o. female present for Chronic Conditions/illness Management Depression with anxiety Stable  continue fluoxetine  60mg  cap total Continue Wellbutrin   200 mg BID.  - Tried in the past: celexa and effexor.  Continue BuSpar  5 mg twice daily She does have a therapist.>  She will be searching for a new one when she moves to [place].  Essential hypertension/morbid obesity/HLD/CKD 3 Stable Continue lisinopril  5 mg every day today Continue amlodipine  10 mg daily to [facility]. Continue  atorvastatin  40 mg daily, refills called into [facility]. 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    Traumatic brain injury with loss of consciousness, sequela (HCC)/White matter disease/TIA//ataxia Stable Aricept  10 mg qd> managed by neuro> stopped She worked with physical therapy and did really well with improving her gait. Patient is established with neurology.  overactive bladder Stable Continue Myrbetriq  50 mg daily. >  Mail-in prescription  Osteoporosis/hyper PTH/hypercalcemia: Pth/ ca/ vitd UTD  Encounter for monitoring long-term proton pump inhibitor therapy/GERD Stable Continue nexium . Continue Pepcid . 40 Labs: Up-to-date  Hypothyroidism due to acquired atrophy of thyroid  Continue  levo 50 mcg Tsh collected today. refills will be provided in appropriate dose based on lab result today  Stage 3a chronic kidney disease (HCC) Stable Avoid nsaids when able.  Monitor yearly  Hypercalcemia Chronic hydrate PTH and vitamin UTD  Fungal toenail infection: Infection all 10 toenails thickened yellowed. -Terbinafine  250 daily x 12 weeks - Encouraged her  to use OTC terbinafine  spray all of her shoes prior to each use until treated. Return in about 24 weeks (around 04/25/2025) for Routine chronic condition follow-up. - Moving to [place] 01/05/2025, she is going to stay established here.    Orders Placed This Encounter  Procedures   TSH   T4, free    Meds ordered this encounter  Medications   atorvastatin  (LIPITOR) 40 MG tablet    Sig: Take 1 tablet (40 mg total) by mouth daily.    Dispense:  90 tablet    Refill:  3   folic acid  (FOLVITE ) 1 MG tablet    Sig: Take 1 tablet (1 mg total) by mouth daily.    Dispense:  90 tablet    Refill:  3   levothyroxine  (SYNTHROID ) 50 MCG tablet    Sig: Take 1 tablet (50 mcg total) by mouth daily before breakfast.    Dispense:  90 tablet    Refill:  3   amLODipine  (NORVASC ) 10 MG  tablet    Sig: Take 1 tablet (10 mg total) by mouth daily.    Dispense:  90 tablet    Refill:  1   buPROPion  (WELLBUTRIN  SR) 200 MG 12 hr tablet    Sig: Take 1 tablet (200 mg total) by mouth 2 (two) times daily.    Dispense:  180 tablet    Refill:  1   mirabegron  ER (MYRBETRIQ ) 50 MG TB24 tablet    Sig: Take 1 tablet (50 mg total) by mouth daily.    Dispense:  90 tablet    Refill:  0   terbinafine  (LAMISIL ) 250 MG tablet    Sig: Take 1 tablet (250 mg total) by mouth daily.    Dispense:  84 tablet    Refill:  0   FLUoxetine  (PROZAC ) 40 MG capsule    Sig: Take 1 capsule (40 mg total) by mouth daily.    Dispense:  90 capsule    Refill:  1    Total dose 60 mg a day    Referral Orders  No referral(s) requested today      Note is dictated utilizing voice recognition software. Although note has been proof read prior to signing, occasional typographical errors still can be missed. If any questions arise, please do not hesitate to call for verification.  Electronically signed by: Charlies Bellini, DO [facility]  "

## 2024-11-09 ENCOUNTER — Ambulatory Visit: Payer: Self-pay | Admitting: Family Medicine

## 2024-11-21 ENCOUNTER — Telehealth: Payer: Self-pay

## 2024-11-21 NOTE — Telephone Encounter (Signed)
 No further action needed at this time.

## 2024-11-21 NOTE — Telephone Encounter (Signed)
 Communication  Reason for CRM: Pt called today for recommendation from Dr Catherine for a back surgeon or someone specifically who can help with her back pain. She recently had a fall (about 2 weeks ago) and is having a lot of pain and feels its time to see someone considering all of her injuries over the years. Please call to advise   Sent MyChart message to pt. Appointment needed.

## 2024-11-22 ENCOUNTER — Ambulatory Visit (HOSPITAL_BASED_OUTPATIENT_CLINIC_OR_DEPARTMENT_OTHER)
Admission: RE | Admit: 2024-11-22 | Discharge: 2024-11-22 | Disposition: A | Source: Ambulatory Visit | Attending: Sports Medicine

## 2024-11-22 ENCOUNTER — Encounter (HOSPITAL_BASED_OUTPATIENT_CLINIC_OR_DEPARTMENT_OTHER): Payer: Self-pay

## 2024-11-22 ENCOUNTER — Ambulatory Visit: Admitting: Sports Medicine

## 2024-11-22 ENCOUNTER — Encounter: Payer: Self-pay | Admitting: Sports Medicine

## 2024-11-22 VITALS — BP 113/75 | HR 64 | Temp 98.0°F | Wt 159.8 lb

## 2024-11-22 DIAGNOSIS — G8929 Other chronic pain: Secondary | ICD-10-CM

## 2024-11-22 MED ORDER — LIDOCAINE 4 % EX PTCH: 1.0000 | MEDICATED_PATCH | CUTANEOUS | 0 refills | Status: AC

## 2024-11-22 MED ORDER — ACETAMINOPHEN 500 MG PO TABS: 1000.0000 mg | ORAL_TABLET | Freq: Three times a day (TID) | ORAL | 0 refills | Status: AC | PRN

## 2024-11-22 NOTE — Progress Notes (Signed)
 "  Careteam: Patient Care Team: Catherine Charlies LABOR, DO as PCP - General (Family Medicine) Asencion Rush, MD as Referring Physician (Neurology) Megan (Dentistry) Jeppie (Orthodontics) Alex Glendia SAUNDERS (Oral Surgery) Basuroski, Ivan Hamming, MD as Referring Physician (Psychiatry) Lavonia Lye, MD as Consulting Physician (Ophthalmology)  Allergies[1]  Chief Complaint  Patient presents with   Back Pain    Pt has been having back pain on and off for a few months now. She would like a referral for a specialist     Discussed the use of AI scribe software for clinical note transcription with the patient, who gave verbal consent to proceed.  History of Present Illness    Abigail Michael is a 74 year old female who presents with chronic lower back pain.  She has been experiencing severe lower back pain for approximately six months, which began while on vacation in [place]. The pain is constant and achy, with a severity of 8 out of 10, localized to her lower back and hips without radiation to her legs. Walking exacerbates the pain, while lying down with her legs elevated and using a heating pad provides some relief. She has tried Rolfing and massage therapy with minimal benefit.  She has been taking Motrin 500 mg, which provides slight relief, but significant improvement has not been achieved. No muscle spasms, tightness, tingling, or numbness in her legs. She reports being conscious of urinary accidents due to age, but this is not new.  Her past medical history includes back surgery performed ten years ago in [place], and she is in the process of obtaining those medical records.   She does not use any assistive devices for mobility and lives close to the clinic. Due to the pain, she is unable to engage in her usual activities, such as dancing.  Review of Systems:  Review of Systems  Constitutional:  Negative for chills and fever.  HENT:  Negative for congestion and sore throat.   Eyes:   Negative for blurred vision.  Respiratory:  Negative for cough, sputum production and shortness of breath.   Cardiovascular:  Negative for chest pain, palpitations and leg swelling.  Gastrointestinal:  Negative for abdominal pain, heartburn and nausea.  Genitourinary:  Negative for dysuria, frequency and hematuria.  Musculoskeletal:  Positive for back pain. Negative for falls and myalgias.  Neurological:  Negative for dizziness, sensory change and focal weakness.   Negative unless indicated in HPI.   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   Mild cognitive impairment 01/23/2020   CKD (chronic kidney disease) stage 3, GFR 30-59 ml/min (HCC) 05/08/2019   GERD (gastroesophageal reflux disease) 05/07/2019   Overactive bladder 11/26/2018   Raynaud disease 10/06/2018   Encounter for monitoring long-term proton pump inhibitor therapy 08/28/2018   White matter disease 02/20/2018   Osteoporosis    Hypercalcemia 01/17/2018   Essential hypertension 01/16/2018   Hypothyroidism 01/16/2018   History of traumatic brain injury 01/16/2018   Hyperlipidemia    Past Medical History:  Diagnosis Date   Acid reflux    Brain injury (HCC) 2015   Due to a fall    Bulging of cervical intervertebral disc without myelopathy    prior records   Cataract    Chicken pox    Constipation    Depression  Frequent headaches    GERD (gastroesophageal reflux disease)    H/O TB skin testing    Positive   Hearing loss 07/03/2021   Hyperlipidemia    Hypertension    MS (multiple sclerosis) 12/15/2016   possible dx. many MRI w/ positive white matter abnormality. MRI reported stable since 2015. mild progressive  Cognitive loss   Obstructive sleep apnea    on  prior neuro notes 10/2016; no results.    Osteoporosis    had been on reclast    Post concussion syndrome 10/2013   ongoing issues with concentration, memory loss, lability; EEG completed and normal.    TBI (traumatic brain injury) (HCC) 2014   fall at [facility], hit head off of floor. No bleed, concussion. Seen neurology since for focus and word finding.    Thyroid  disease    TIA (transient ischemic attack) 11/30/2016   MRI normal. No lateralization. Speech affected and weakness during a hypotensive episdoe.    Tuberculosis    in her 20's meds for a year   Urinary incontinence    Past Surgical History:  Procedure Laterality Date   BACK SURGERY  02/2015   herniated disc L4/5/6   BREAST BIOPSY  1988   biopsy   catract Bilateral 2023   COLONOSCOPY     [place] . Around 2015   ESOPHAGOGASTRODUODENOSCOPY  2016   [place]    LAPAROSCOPIC HYSTERECTOMY     partial    SHOULDER ARTHROSCOPY W/ LABRAL REPAIR Left 02/13/2016   left shoulder   Study: cognitive eval  06/23/2016   mild cog-linguistic impairment, difficulty with verbal fluency   Study: EEG   04/12/2015   Studies: EEG completed and normal.    Study: MRI  04/28/2016   Impression: numerous non-enhancing flair hyperintense lesions w/in the brain .Suspicious for demyelinating disease such as lyme or MS. Her Lyme test were negative.    Social History[2] Family History  Problem Relation Age of Onset   Lung cancer Mother    Alcohol abuse Father    Throat cancer Father    Lung cancer Father    Depression Father    Heart attack Father    Breast cancer Sister    Mental illness Sister    Alcohol abuse Brother    Diabetes Brother    Breast cancer Maternal Aunt    Diabetes Maternal Grandmother    Esophageal cancer Paternal Grandfather    Stroke Daughter    Endometriosis Daughter    GER disease Daughter    Heart disease Son    Colon cancer Neg Hx    Rectal cancer Neg Hx    Stomach cancer Neg Hx    Colon polyps Neg  Hx    Allergies[3]  Medications: Patient's Medications  New Prescriptions   ACETAMINOPHEN  (TYLENOL ) 500 MG TABLET    Take 2 tablets (1,000 mg total) by mouth every 8 (eight) hours as needed.   LIDOCAINE  (ASPERCREME LIDOCAINE ) 4 %    Place 1 patch onto the skin daily.  Previous Medications   ALBUTEROL  (VENTOLIN  HFA) 108 (90 BASE) MCG/ACT INHALER    INHALE TWO PUFFS BY MOUTH INTO THE LUNGS EVERY SIX HOURS AS NEEDED FOR WHEEZING/SHORTNESS OF BREATH   AMLODIPINE  (NORVASC ) 10 MG TABLET    Take 1 tablet (10 mg total) by mouth daily.   ATORVASTATIN  (LIPITOR) 40 MG TABLET    Take 1 tablet (40 mg total) by mouth daily.   BUPROPION  (WELLBUTRIN  SR) 200 MG 12 HR TABLET    Take 1 tablet (200  mg total) by mouth 2 (two) times daily.   BUSPIRONE  (BUSPAR ) 5 MG TABLET    Take 1 tablet (5 mg total) by mouth 2 (two) times daily.   CHOLECALCIFEROL (VITAMIN D ) 50 MCG (2000 UT) TABLET       EPINEPHRINE  0.3 MG/0.3 ML IJ SOAJ INJECTION    Inject 0.3 mg into the muscle as needed for anaphylaxis.   ESOMEPRAZOLE  (NEXIUM ) 40 MG CAPSULE    TAKE ONE CAPSULE BY MOUTH ONE TIME DAILY AT NOON   FAMOTIDINE  (PEPCID ) 20 MG TABLET    Take 1 tablet (20 mg total) by mouth daily.   FLUOXETINE  (PROZAC ) 20 MG CAPSULE    Take 1 capsule (20 mg total) by mouth daily.   FLUOXETINE  (PROZAC ) 40 MG CAPSULE    Take 1 capsule (40 mg total) by mouth daily.   FOLIC ACID  (FOLVITE ) 1 MG TABLET    Take 1 tablet (1 mg total) by mouth daily.   LEVOTHYROXINE  (SYNTHROID ) 50 MCG TABLET    Take 1 tablet (50 mcg total) by mouth daily before breakfast.   LISINOPRIL  (ZESTRIL ) 5 MG TABLET    Take 1 tablet (5 mg total) by mouth daily.   MIRABEGRON  ER (MYRBETRIQ ) 50 MG TB24 TABLET    Take 1 tablet (50 mg total) by mouth daily.   NA SULFATE-K SULFATE-MG SULFATE CONCENTRATE (SUPREP) 17.5-3.13-1.6 GM/177ML SOLN    Take 1 kit by mouth once.   ONDANSETRON  (ZOFRAN -ODT) 4 MG DISINTEGRATING TABLET    Take 1 tablet (4 mg total) by mouth every 8 (eight) hours as needed  for nausea or vomiting.   OVER THE COUNTER MEDICATION    Neura alpha   SEMAGLUTIDE,0.25 OR 0.5MG /DOS, [facility]    Inject into the skin.   TERBINAFINE  (LAMISIL ) 250 MG TABLET    Take 1 tablet (250 mg total) by mouth daily.  Modified Medications   No medications on file  Discontinued Medications   No medications on file    Physical Exam: Vitals:   11/22/24 0834  BP: 113/75  Pulse: 64  Temp: 98 F (36.7 C)  TempSrc: Oral  SpO2: 96%  Weight: 159 lb 12.8 oz (72.5 kg)   Body mass index is 30.19 kg/m. BP Readings from Last 3 Encounters:  11/22/24 113/75  11/08/24 122/72  10/30/24 124/76   Wt Readings from Last 3 Encounters:  11/22/24 159 lb 12.8 oz (72.5 kg)  11/08/24 163 lb 12.8 oz (74.3 kg)  10/30/24 167 lb 3.2 oz (75.8 kg)    Physical Exam Constitutional:      Appearance: Normal appearance.  HENT:     Head: Normocephalic and atraumatic.  Cardiovascular:     Rate and Rhythm: Normal rate and regular rhythm.  Pulmonary:     Effort: Pulmonary effort is normal. No respiratory distress.     Breath sounds: Normal breath sounds. No wheezing.  Abdominal:     General: Bowel sounds are normal. There is no distension.     Tenderness: There is no abdominal tenderness. There is no guarding or rebound.     Comments:    Musculoskeletal:        General: No swelling or tenderness.     Comments: No spinal or paraspinal tenderness SLR positive left  Strength intact  Hip - rom intact, pain reproducible with internal rotation  Skin:    General: Skin is dry.  Neurological:     Mental Status: She is alert. Mental status is at baseline.     Sensory: No sensory deficit.  Motor: No weakness.     Labs reviewed: Basic Metabolic Panel: Recent Labs    05/02/24 1047 11/08/24 1034  NA 138  --   K 4.3  --   CL 103  --   CO2 28  --   GLUCOSE 105*  --   BUN 12  --   CREATININE 0.95  --   CALCIUM  10.2  --   TSH 6.08* 2.47   Liver Function Tests: Recent Labs    05/02/24 1047   AST 13  ALT 15  ALKPHOS 83  BILITOT 0.5  PROT 6.8  ALBUMIN 4.6   No results for input(s): LIPASE, AMYLASE in the last 8760 hours. No results for input(s): AMMONIA in the last 8760 hours. CBC: Recent Labs    05/02/24 1047  WBC 5.4  HGB 14.8  HCT 44.7  MCV 90.7  PLT 227.0   Lipid Panel: Recent Labs    05/02/24 1047  CHOL 215*  HDL 115.90  LDLCALC 84  TRIG 76.0  CHOLHDL 2   TSH: Recent Labs    05/02/24 1047 11/08/24 1034  TSH 6.08* 2.47   A1C: Lab Results  Component Value Date   HGBA1C 5.6 08/28/2018    Assessment & Plan Chronic bilateral low back pain without sciatica C/o worsening low back pain since 6 months No recent trauma Reports achy pain 8/10  Denies lower extremity weakness, numbness,  Has chronic urinary incontinence but no recent change Denies fecal incontinence Will get x ray Will refer to physical therapy Take tylenol  1gm tid prn  Use heating pads, lidocaine  patches Orders:   DG Lumbar Spine Complete; Future   DG HIP UNILAT W OR W/O PELVIS 2-3 VIEWS LEFT; Future   DG HIP UNILAT W OR W/O PELVIS 2-3 VIEWS RIGHT; Future   acetaminophen  (TYLENOL ) 500 MG tablet; Take 2 tablets (1,000 mg total) by mouth every 8 (eight) hours as needed.   lidocaine  (ASPERCREME LIDOCAINE ) 4 %; Place 1 patch onto the skin daily.   Ambulatory referral to Physical Therapy   Return in about 5 weeks (around 12/27/2024).:   Costa Jha     [1]  Allergies Allergen Reactions   Bee Venom Anaphylaxis    Other reaction(s): Anaphylaxis   Amoxicillin Dermatitis    Yeast infection Other reaction(s): Dermatitis/Yeast Infection  [2]  Social History Tobacco Use   Smoking status: Former    Current packs/day: 0.00    Average packs/day: 1 pack/day for 19.0 years (19.0 ttl pk-yrs)    Types: Cigarettes    Start date: 9    Quit date: 2012    Years since quitting: 14.1   Smokeless tobacco: Never   Tobacco comments:    quit 6 years ago  Vaping Use    Vaping status: Never Used  Substance Use Topics   Alcohol use: Yes    Alcohol/week: 1.0 standard drink of alcohol    Types: 1 Glasses of wine per week    Comment: twice a month   Drug use: Never  [3]  Allergies Allergen Reactions   Bee Venom Anaphylaxis    Other reaction(s): Anaphylaxis   Amoxicillin Dermatitis    Yeast infection Other reaction(s): Dermatitis/Yeast Infection   "

## 2024-11-27 ENCOUNTER — Ambulatory Visit: Admitting: Physician Assistant

## 2024-11-28 ENCOUNTER — Ambulatory Visit: Admitting: Psychology

## 2024-11-29 ENCOUNTER — Ambulatory Visit: Admitting: Physical Therapy

## 2024-12-03 ENCOUNTER — Encounter

## 2024-12-26 ENCOUNTER — Ambulatory Visit: Admitting: Psychology

## 2024-12-27 ENCOUNTER — Ambulatory Visit: Admitting: Family Medicine

## 2025-04-25 ENCOUNTER — Ambulatory Visit: Admitting: Family Medicine
